# Patient Record
Sex: Female | Born: 1970
Health system: Southern US, Community
[De-identification: ages and names within clinical notes are randomized; demographics above are authoritative.]

## PROBLEM LIST (undated history)

## (undated) DIAGNOSIS — E785 Hyperlipidemia, unspecified: Secondary | ICD-10-CM

## (undated) DIAGNOSIS — G8929 Other chronic pain: Secondary | ICD-10-CM

## (undated) DIAGNOSIS — D649 Anemia, unspecified: Secondary | ICD-10-CM

## (undated) DIAGNOSIS — K760 Fatty (change of) liver, not elsewhere classified: Secondary | ICD-10-CM

## (undated) DIAGNOSIS — T7840XA Allergy, unspecified, initial encounter: Secondary | ICD-10-CM

## (undated) DIAGNOSIS — F418 Other specified anxiety disorders: Secondary | ICD-10-CM

## (undated) DIAGNOSIS — M549 Dorsalgia, unspecified: Secondary | ICD-10-CM

## (undated) DIAGNOSIS — Z8619 Personal history of other infectious and parasitic diseases: Secondary | ICD-10-CM

## (undated) DIAGNOSIS — G4730 Sleep apnea, unspecified: Secondary | ICD-10-CM

## (undated) DIAGNOSIS — K2270 Barrett's esophagus without dysplasia: Secondary | ICD-10-CM

## (undated) DIAGNOSIS — K5900 Constipation, unspecified: Secondary | ICD-10-CM

## (undated) DIAGNOSIS — K219 Gastro-esophageal reflux disease without esophagitis: Secondary | ICD-10-CM

## (undated) DIAGNOSIS — R600 Localized edema: Secondary | ICD-10-CM

## (undated) DIAGNOSIS — E559 Vitamin D deficiency, unspecified: Secondary | ICD-10-CM

## (undated) DIAGNOSIS — F419 Anxiety disorder, unspecified: Secondary | ICD-10-CM

## (undated) DIAGNOSIS — K317 Polyp of stomach and duodenum: Secondary | ICD-10-CM

## (undated) DIAGNOSIS — E119 Type 2 diabetes mellitus without complications: Secondary | ICD-10-CM

## (undated) HISTORY — DX: Barrett's esophagus without dysplasia: K22.70

## (undated) HISTORY — DX: Anemia, unspecified: D64.9

## (undated) HISTORY — DX: Type 2 diabetes mellitus without complications: E11.9

## (undated) HISTORY — DX: Polyp of stomach and duodenum: K31.7

## (undated) HISTORY — DX: Other chronic pain: G89.29

## (undated) HISTORY — PX: ESOPHAGOGASTRODUODENOSCOPY: SHX1529

## (undated) HISTORY — DX: Other specified anxiety disorders: F41.8

## (undated) HISTORY — DX: Constipation, unspecified: K59.00

## (undated) HISTORY — PX: WISDOM TOOTH EXTRACTION: SHX21

## (undated) HISTORY — DX: Sleep apnea, unspecified: G47.30

## (undated) HISTORY — DX: Hyperlipidemia, unspecified: E78.5

## (undated) HISTORY — DX: Personal history of other infectious and parasitic diseases: Z86.19

## (undated) HISTORY — DX: Allergy, unspecified, initial encounter: T78.40XA

## (undated) HISTORY — DX: Vitamin D deficiency, unspecified: E55.9

## (undated) HISTORY — DX: Anxiety disorder, unspecified: F41.9

## (undated) HISTORY — DX: Localized edema: R60.0

## (undated) HISTORY — DX: Gastro-esophageal reflux disease without esophagitis: K21.9

## (undated) HISTORY — DX: Dorsalgia, unspecified: M54.9

## (undated) HISTORY — DX: Fatty (change of) liver, not elsewhere classified: K76.0

---

## 1995-03-20 HISTORY — PX: LUMBAR DISC SURGERY: SHX700

## 1998-02-18 ENCOUNTER — Other Ambulatory Visit: Admission: RE | Admit: 1998-02-18 | Discharge: 1998-02-18 | Payer: Self-pay | Admitting: Obstetrics and Gynecology

## 1999-02-22 ENCOUNTER — Other Ambulatory Visit: Admission: RE | Admit: 1999-02-22 | Discharge: 1999-02-22 | Payer: Self-pay | Admitting: Obstetrics and Gynecology

## 1999-12-16 ENCOUNTER — Emergency Department (HOSPITAL_COMMUNITY): Admission: EM | Admit: 1999-12-16 | Discharge: 1999-12-16 | Payer: Self-pay | Admitting: Emergency Medicine

## 1999-12-16 ENCOUNTER — Encounter: Payer: Self-pay | Admitting: Emergency Medicine

## 2000-04-27 ENCOUNTER — Other Ambulatory Visit: Admission: RE | Admit: 2000-04-27 | Discharge: 2000-04-27 | Payer: Self-pay | Admitting: Obstetrics and Gynecology

## 2000-09-06 ENCOUNTER — Encounter: Payer: Self-pay | Admitting: Internal Medicine

## 2000-09-06 ENCOUNTER — Ambulatory Visit (HOSPITAL_COMMUNITY): Admission: RE | Admit: 2000-09-06 | Discharge: 2000-09-06 | Payer: Self-pay | Admitting: Internal Medicine

## 2001-03-19 HISTORY — PX: NASAL SINUS SURGERY: SHX719

## 2001-04-23 ENCOUNTER — Other Ambulatory Visit: Admission: RE | Admit: 2001-04-23 | Discharge: 2001-04-23 | Payer: Self-pay | Admitting: Otolaryngology

## 2001-04-23 ENCOUNTER — Encounter (INDEPENDENT_AMBULATORY_CARE_PROVIDER_SITE_OTHER): Payer: Self-pay

## 2001-05-11 ENCOUNTER — Other Ambulatory Visit: Admission: RE | Admit: 2001-05-11 | Discharge: 2001-05-11 | Payer: Self-pay | Admitting: Obstetrics and Gynecology

## 2002-05-14 ENCOUNTER — Other Ambulatory Visit: Admission: RE | Admit: 2002-05-14 | Discharge: 2002-05-14 | Payer: Self-pay | Admitting: Obstetrics and Gynecology

## 2003-03-21 ENCOUNTER — Other Ambulatory Visit: Admission: RE | Admit: 2003-03-21 | Discharge: 2003-03-21 | Payer: Self-pay | Admitting: Internal Medicine

## 2004-04-08 ENCOUNTER — Other Ambulatory Visit: Admission: RE | Admit: 2004-04-08 | Discharge: 2004-04-08 | Payer: Self-pay | Admitting: Internal Medicine

## 2005-04-11 ENCOUNTER — Other Ambulatory Visit: Admission: RE | Admit: 2005-04-11 | Discharge: 2005-04-11 | Payer: Self-pay | Admitting: Internal Medicine

## 2006-04-20 ENCOUNTER — Other Ambulatory Visit: Admission: RE | Admit: 2006-04-20 | Discharge: 2006-04-20 | Payer: Self-pay | Admitting: Internal Medicine

## 2007-05-31 ENCOUNTER — Other Ambulatory Visit: Admission: RE | Admit: 2007-05-31 | Discharge: 2007-05-31 | Payer: Self-pay | Admitting: Internal Medicine

## 2007-08-09 ENCOUNTER — Emergency Department (HOSPITAL_COMMUNITY): Admission: EM | Admit: 2007-08-09 | Discharge: 2007-08-09 | Payer: Self-pay | Admitting: Emergency Medicine

## 2008-08-28 ENCOUNTER — Ambulatory Visit: Payer: Self-pay | Admitting: Internal Medicine

## 2008-08-29 ENCOUNTER — Ambulatory Visit: Payer: Self-pay | Admitting: Internal Medicine

## 2008-09-26 ENCOUNTER — Ambulatory Visit: Payer: Self-pay | Admitting: Internal Medicine

## 2009-09-19 HISTORY — PX: DILATION AND CURETTAGE OF UTERUS: SHX78

## 2009-11-23 ENCOUNTER — Ambulatory Visit: Payer: Self-pay | Admitting: Internal Medicine

## 2009-12-21 ENCOUNTER — Ambulatory Visit (HOSPITAL_COMMUNITY): Admission: RE | Admit: 2009-12-21 | Discharge: 2009-12-21 | Payer: Self-pay | Admitting: Obstetrics and Gynecology

## 2010-08-30 ENCOUNTER — Ambulatory Visit: Payer: Self-pay | Admitting: Internal Medicine

## 2010-10-11 ENCOUNTER — Encounter: Payer: Self-pay | Admitting: Family Medicine

## 2010-12-13 LAB — HCG, SERUM, QUALITATIVE: Preg, Serum: NEGATIVE

## 2010-12-13 LAB — CBC
HCT: 39.1 % (ref 36.0–46.0)
Hemoglobin: 13.1 g/dL (ref 12.0–15.0)
MCHC: 33.6 g/dL (ref 30.0–36.0)
MCV: 91 fL (ref 78.0–100.0)
Platelets: 306 10*3/uL (ref 150–400)
RBC: 4.29 MIL/uL (ref 3.87–5.11)
RDW: 14.5 % (ref 11.5–15.5)
WBC: 11.2 10*3/uL — ABNORMAL HIGH (ref 4.0–10.5)

## 2012-05-20 DIAGNOSIS — K317 Polyp of stomach and duodenum: Secondary | ICD-10-CM

## 2012-05-20 HISTORY — DX: Polyp of stomach and duodenum: K31.7

## 2012-07-04 LAB — HIV ANTIBODY: HIV 1&2 Ab, 4th Generation: NONREACTIVE

## 2012-07-04 LAB — RPR: RPR: NONREACTIVE

## 2012-10-30 LAB — LIPID PANEL
Cholesterol, Total: 123
HDL: 39 mg/dL (ref 35–70)
LDL (calc): 59
Triglycerides: 124

## 2012-10-30 LAB — HEMOGLOBIN A1C: A1c: 6.7

## 2012-10-30 LAB — VITAMIN D 25 HYDROXY (VIT D DEFICIENCY, FRACTURES): Vit D, 25-Hydroxy: 20.22

## 2013-11-04 ENCOUNTER — Other Ambulatory Visit: Payer: Self-pay | Admitting: Internal Medicine

## 2013-11-04 ENCOUNTER — Ambulatory Visit: Payer: Self-pay | Admitting: Internal Medicine

## 2013-11-04 ENCOUNTER — Encounter: Payer: Self-pay | Admitting: Internal Medicine

## 2013-11-04 ENCOUNTER — Ambulatory Visit (INDEPENDENT_AMBULATORY_CARE_PROVIDER_SITE_OTHER): Payer: BC Managed Care – PPO | Admitting: Internal Medicine

## 2013-11-04 ENCOUNTER — Telehealth: Payer: Self-pay | Admitting: Radiology

## 2013-11-04 VITALS — BP 114/68 | HR 95 | Temp 98.4°F | Ht 65.25 in | Wt 271.0 lb

## 2013-11-04 DIAGNOSIS — Z6841 Body Mass Index (BMI) 40.0 and over, adult: Secondary | ICD-10-CM

## 2013-11-04 DIAGNOSIS — F32A Depression, unspecified: Secondary | ICD-10-CM

## 2013-11-04 DIAGNOSIS — K219 Gastro-esophageal reflux disease without esophagitis: Secondary | ICD-10-CM | POA: Insufficient documentation

## 2013-11-04 DIAGNOSIS — F3289 Other specified depressive episodes: Secondary | ICD-10-CM

## 2013-11-04 DIAGNOSIS — E119 Type 2 diabetes mellitus without complications: Secondary | ICD-10-CM

## 2013-11-04 DIAGNOSIS — F329 Major depressive disorder, single episode, unspecified: Secondary | ICD-10-CM

## 2013-11-04 DIAGNOSIS — R509 Fever, unspecified: Secondary | ICD-10-CM

## 2013-11-04 DIAGNOSIS — F331 Major depressive disorder, recurrent, moderate: Secondary | ICD-10-CM | POA: Insufficient documentation

## 2013-11-04 LAB — LIPID PANEL
Cholesterol: 179 mg/dL (ref 0–200)
HDL: 38.6 mg/dL — ABNORMAL LOW (ref >39.00)
LDL Cholesterol: 101 mg/dL — ABNORMAL HIGH (ref 0–99)
Total CHOL/HDL Ratio: 5
Triglycerides: 199 mg/dL — ABNORMAL HIGH (ref 0.0–149.0)
VLDL: 39.8 mg/dL (ref 0.0–40.0)

## 2013-11-04 LAB — COMPREHENSIVE METABOLIC PANEL
ALT: 20 U/L (ref 0–35)
AST: 30 U/L (ref 0–37)
Albumin: 3.7 g/dL (ref 3.5–5.2)
Alkaline Phosphatase: 94 U/L (ref 39–117)
BUN: 7 mg/dL (ref 6–23)
CO2: 26 mEq/L (ref 19–32)
Calcium: 8.9 mg/dL (ref 8.4–10.5)
Chloride: 102 mEq/L (ref 96–112)
Creatinine, Ser: 0.8 mg/dL (ref 0.4–1.2)
GFR: 82.99 mL/min (ref >60.00)
Glucose, Bld: 136 mg/dL — ABNORMAL HIGH (ref 70–99)
Potassium: 3.7 mEq/L (ref 3.5–5.1)
Sodium: 138 mEq/L (ref 135–145)
Total Bilirubin: 0.5 mg/dL (ref 0.3–1.2)
Total Protein: 7.5 g/dL (ref 6.0–8.3)

## 2013-11-04 LAB — CBC
HCT: 40.2 % (ref 36.0–46.0)
Hemoglobin: 13 g/dL (ref 12.0–15.0)
MCHC: 32.3 g/dL (ref 30.0–36.0)
MCV: 87.5 fl (ref 78.0–100.0)
Platelets: 344 10*3/uL (ref 150.0–400.0)
RBC: 4.6 Mil/uL (ref 3.87–5.11)
RDW: 15.4 % — ABNORMAL HIGH (ref 11.5–14.6)
WBC: 12.2 10*3/uL — ABNORMAL HIGH (ref 4.5–10.5)

## 2013-11-04 LAB — HEMOGLOBIN A1C: Hgb A1c MFr Bld: 8 % — ABNORMAL HIGH (ref 4.6–6.5)

## 2013-11-04 LAB — TSH: TSH: 2.61 u[IU]/mL (ref 0.35–5.50)

## 2013-11-04 LAB — POCT INFLUENZA A/B

## 2013-11-04 MED ORDER — DEXLANSOPRAZOLE 60 MG PO CPDR: 60.0000 mg | DELAYED_RELEASE_CAPSULE | Freq: Every day | ORAL | Status: DC

## 2013-11-04 NOTE — Telephone Encounter (Signed)
error 

## 2013-11-04 NOTE — Assessment & Plan Note (Signed)
Well controlled on effexor

## 2013-11-04 NOTE — Assessment & Plan Note (Signed)
Well controlled on Dexilant Medication refilled today

## 2013-11-04 NOTE — Assessment & Plan Note (Signed)
She does not test her sugars She is not taking the Tradjenta that is prescribed She is non compliant with diet and exercise Will check A1C today

## 2013-11-04 NOTE — Progress Notes (Signed)
HPI  Flu: 08/2013 Tetanus: more than 10 years ago LMP: 10/02/2012 Pap Smear: 2012 Mammogram: 2012 Eye Doctor: yearly Dentist: biannually  Past Medical History  Diagnosis Date  . Depression   . Diabetes mellitus without complication   . Allergy   . GERD (gastroesophageal reflux disease)   . Hyperlipidemia   . Barrett esophagus     Current Outpatient Prescriptions  Medication Sig Dispense Refill  . dexlansoprazole (DEXILANT) 60 MG capsule Take 60 mg by mouth daily.      Marland Kitchen LORazepam (ATIVAN) 0.5 MG tablet Take 0.5 mg by mouth every other day.      . venlafaxine XR (EFFEXOR-XR) 150 MG 24 hr capsule Take 150 mg by mouth daily with breakfast.      . venlafaxine XR (EFFEXOR-XR) 75 MG 24 hr capsule Take 75 mg by mouth daily with breakfast.       No current facility-administered medications for this visit.    Allergies  Allergen Reactions  . Biaxin [Clarithromycin] Hives    Family History  Problem Relation Age of Onset  . Breast cancer Maternal Grandmother   . Heart disease Paternal Grandfather     History   Social History  . Marital Status: Married    Spouse Name: N/A    Number of Children: N/A  . Years of Education: N/A   Occupational History  . Not on file.   Social History Main Topics  . Smoking status: Never Smoker   . Smokeless tobacco: Never Used  . Alcohol Use: No  . Drug Use: No  . Sexual Activity: Not on file   Other Topics Concern  . Not on file   Social History Narrative  . No narrative on file    ROS:  Constitutional: Denies fever, malaise, fatigue, headache or abrupt weight changes.  HEENT: Denies eye pain, eye redness, ear pain, ringing in the ears, wax buildup, runny nose, nasal congestion, bloody nose, or sore throat. Respiratory: Denies difficulty breathing, shortness of breath, cough or sputum production.   Cardiovascular: Denies chest pain, chest tightness, palpitations or swelling in the hands or feet.  Gastrointestinal: Denies  abdominal pain, bloating, constipation, diarrhea or blood in the stool.  GU: Denies frequency, urgency, pain with urination, blood in urine, odor or discharge. Musculoskeletal: Denies decrease in range of motion, difficulty with gait, muscle pain or joint pain and swelling.  Skin: Denies redness, rashes, lesions or ulcercations.  Neurological: Denies dizziness, difficulty with memory, difficulty with speech or problems with balance and coordination.   No other specific complaints in a complete review of systems (except as listed in HPI above).  PE:  BP 114/68  Pulse 95  Temp(Src) 98.4 F (36.9 C) (Oral)  Ht 5' 5.25" (1.657 m)  Wt 271 lb (122.925 kg)  BMI 44.77 kg/m2  SpO2 98%  LMP 10/02/2012 Wt Readings from Last 3 Encounters:  11/04/13 271 lb (122.925 kg)    General: Appears their stated age, well developed, well nourished in NAD. HEENT: Head: normal shape and size; Eyes: sclera white, no icterus, conjunctiva pink, PERRLA and EOMs intact; Ears: Tm's gray and intact, normal light reflex; Nose: mucosa pink and moist, septum midline; Throat/Mouth: Teeth present, mucosa pink and moist, no lesions or ulcerations noted.  Neck: Normal range of motion. Neck supple, trachea midline. No massses, lumps or thyromegaly present.  Cardiovascular: Normal rate and rhythm. S1,S2 noted.  No murmur, rubs or gallops noted. No JVD or BLE edema. No carotid bruits noted. Pulmonary/Chest: Normal effort and positive  vesicular breath sounds. No respiratory distress. No wheezes, rales or ronchi noted.  Abdomen: Soft and nontender. Normal bowel sounds, no bruits noted. No distention or masses noted. Liver, spleen and kidneys non palpable. Musculoskeletal: Normal range of motion. No signs of joint swelling. No difficulty with gait.  Neurological: Alert and oriented. Cranial nerves II-XII intact. Coordination normal. +DTRs bilaterally. Psychiatric: Mood and affect normal. Behavior is normal. Judgment and thought  content normal.   EKG:  BMET No results found for this basename: na, k, cl, co2, glucose, bun, creatinine, calcium, gfrnonaa, gfraa    Lipid Panel  No results found for this basename: chol, trig, hdl, cholhdl, vldl, ldlcalc    CBC    Component Value Date/Time   WBC 11.2* 12/17/2009 1441   RBC 4.29 12/17/2009 1441   HGB 13.1 12/17/2009 1441   HCT 39.1 12/17/2009 1441   PLT 306 12/17/2009 1441   MCV 91.0 12/17/2009 1441   MCHC 33.6 12/17/2009 1441   RDW 14.5 12/17/2009 1441    Hgb A1C No results found for this basename: HGBA1C     Assessment and Plan:

## 2013-11-04 NOTE — Patient Instructions (Addendum)
Type 2 Diabetes Mellitus, Adult Type 2 diabetes mellitus, often simply referred to as type 2 diabetes, is a long-lasting (chronic) disease. In type 2 diabetes, the pancreas does not make enough insulin (a hormone), the cells are less responsive to the insulin that is made (insulin resistance), or both. Normally, insulin moves sugars from food into the tissue cells. The tissue cells use the sugars for energy. The lack of insulin or the lack of normal response to insulin causes excess sugars to build up in the blood instead of going into the tissue cells. As a result, high blood sugar (hyperglycemia) develops. The effect of high sugar (glucose) levels can cause many complications. Type 2 diabetes was also previously called adult-onset diabetes but it can occur at any age.  RISK FACTORS  A person is predisposed to developing type 2 diabetes if someone in the family has the disease and also has one or more of the following primary risk factors:  Overweight.  An inactive lifestyle.  A history of consistently eating high-calorie foods. Maintaining a normal weight and regular physical activity can reduce the chance of developing type 2 diabetes. SYMPTOMS  A person with type 2 diabetes may not show symptoms initially. The symptoms of type 2 diabetes appear slowly. The symptoms include:  Increased thirst (polydipsia).  Increased urination (polyuria).  Increased urination during the night (nocturia).  Weight loss. This weight loss may be rapid.  Frequent, recurring infections.  Tiredness (fatigue).  Weakness.  Vision changes, such as blurred vision.  Fruity smell to your breath.  Abdominal pain.  Nausea or vomiting.  Cuts or bruises which are slow to heal.  Tingling or numbness in the hands or feet. DIAGNOSIS Type 2 diabetes is frequently not diagnosed until complications of diabetes are present. Type 2 diabetes is diagnosed when symptoms or complications are present and when blood  glucose levels are increased. Your blood glucose level may be checked by one or more of the following blood tests:  A fasting blood glucose test. You will not be allowed to eat for at least 8 hours before a blood sample is taken.  A random blood glucose test. Your blood glucose is checked at any time of the day regardless of when you ate.  A hemoglobin A1c blood glucose test. A hemoglobin A1c test provides information about blood glucose control over the previous 3 months.  An oral glucose tolerance test (OGTT). Your blood glucose is measured after you have not eaten (fasted) for 2 hours and then after you drink a glucose-containing beverage. TREATMENT   You may need to take insulin or diabetes medicine daily to keep blood glucose levels in the desired range.  You will need to match insulin dosing with exercise and healthy food choices. The treatment goal is to maintain the before meal blood sugar (preprandial glucose) level at 70 130 mg/dL. HOME CARE INSTRUCTIONS   Have your hemoglobin A1c level checked twice a year.  Perform daily blood glucose monitoring as directed by your caregiver.  Monitor urine ketones when you are ill and as directed by your caregiver.  Take your diabetes medicine or insulin as directed by your caregiver to maintain your blood glucose levels in the desired range.  Never run out of diabetes medicine or insulin. It is needed every day.  Adjust insulin based on your intake of carbohydrates. Carbohydrates can raise blood glucose levels but need to be included in your diet. Carbohydrates provide vitamins, minerals, and fiber which are an essential part of   a healthy diet. Carbohydrates are found in fruits, vegetables, whole grains, dairy products, legumes, and foods containing added sugars.    Eat healthy foods. Alternate 3 meals with 3 snacks.  Lose weight if overweight.  Carry a medical alert card or wear your medical alert jewelry.  Carry a 15 gram  carbohydrate snack with you at all times to treat low blood glucose (hypoglycemia). Some examples of 15 gram carbohydrate snacks include:  Glucose tablets, 3 or 4   Glucose gel, 15 gram tube  Raisins, 2 tablespoons (24 grams)  Jelly beans, 6  Animal crackers, 8  Regular pop, 4 ounces (120 mL)  Gummy treats, 9  Recognize hypoglycemia. Hypoglycemia occurs with blood glucose levels of 70 mg/dL and below. The risk for hypoglycemia increases when fasting or skipping meals, during or after intense exercise, and during sleep. Hypoglycemia symptoms can include:  Tremors or shakes.  Decreased ability to concentrate.  Sweating.  Increased heart rate.  Headache.  Dry mouth.  Hunger.  Irritability.  Anxiety.  Restless sleep.  Altered speech or coordination.  Confusion.  Treat hypoglycemia promptly. If you are alert and able to safely swallow, follow the 15:15 rule:  Take 15 20 grams of rapid-acting glucose or carbohydrate. Rapid-acting options include glucose gel, glucose tablets, or 4 ounces (120 mL) of fruit juice, regular soda, or low fat milk.  Check your blood glucose level 15 minutes after taking the glucose.  Take 15 20 grams more of glucose if the repeat blood glucose level is still 70 mg/dL or below.  Eat a meal or snack within 1 hour once blood glucose levels return to normal.    Be alert to polyuria and polydipsia which are early signs of hyperglycemia. An early awareness of hyperglycemia allows for prompt treatment. Treat hyperglycemia as directed by your caregiver.  Engage in at least 150 minutes of moderate-intensity physical activity a week, spread over at least 3 days of the week or as directed by your caregiver. In addition, you should engage in resistance exercise at least 2 times a week or as directed by your caregiver.  Adjust your medicine and food intake as needed if you start a new exercise or sport.  Follow your sick day plan at any time you  are unable to eat or drink as usual.  Avoid tobacco use.  Limit alcohol intake to no more than 1 drink per day for nonpregnant women and 2 drinks per day for men. You should drink alcohol only when you are also eating food. Talk with your caregiver whether alcohol is safe for you. Tell your caregiver if you drink alcohol several times a week.  Follow up with your caregiver regularly.  Schedule an eye exam soon after the diagnosis of type 2 diabetes and then annually.  Perform daily skin and foot care. Examine your skin and feet daily for cuts, bruises, redness, nail problems, bleeding, blisters, or sores. A foot exam by a caregiver should be done annually.  Brush your teeth and gums at least twice a day and floss at least once a day. Follow up with your dentist regularly.  Share your diabetes management plan with your workplace or school.  Stay up-to-date with immunizations.  Learn to manage stress.  Obtain ongoing diabetes education and support as needed.  Participate in, or seek rehabilitation as needed to maintain or improve independence and quality of life. Request a physical or occupational therapy referral if you are having foot or hand numbness or difficulties with grooming,   dressing, eating, or physical activity. SEEK MEDICAL CARE IF:   You are unable to eat food or drink fluids for more than 6 hours.  You have nausea and vomiting for more than 6 hours.  Your blood glucose level is over 240 mg/dL.  There is a change in mental status.  You develop an additional serious illness.  You have diarrhea for more than 6 hours.  You have been sick or have had a fever for a couple of days and are not getting better.  You have pain during any physical activity.  SEEK IMMEDIATE MEDICAL CARE IF:  You have difficulty breathing.  You have moderate to large ketone levels. MAKE SURE YOU:  Understand these instructions.  Will watch your condition.  Will get help right away if  you are not doing well or get worse. Document Released: 09/05/2005 Document Revised: 05/30/2012 Document Reviewed: 04/03/2012 [employer] Patient Information 2014 [employer].  

## 2013-11-04 NOTE — Progress Notes (Signed)
Pre-visit discussion using our clinic review tool. No additional management support is needed unless otherwise documented below in the visit note.  

## 2013-11-04 NOTE — Assessment & Plan Note (Signed)
Non compliant with diet and exercise Will also check A1C today

## 2013-11-08 ENCOUNTER — Other Ambulatory Visit: Payer: Self-pay | Admitting: Internal Medicine

## 2013-11-08 MED ORDER — GLIPIZIDE 10 MG PO TABS: 10.0000 mg | ORAL_TABLET | Freq: Two times a day (BID) | ORAL | Status: DC

## 2013-11-12 ENCOUNTER — Encounter: Payer: BC Managed Care – PPO | Admitting: Internal Medicine

## 2013-11-19 ENCOUNTER — Telehealth: Payer: Self-pay

## 2013-11-19 NOTE — Telephone Encounter (Signed)
Pt left v/m pt has been taking glucotrol and is not feeling well and is not sure if it from taking med or from blood sugars. Pt request cb after 3 PM. Tried to reach pt by phone but unsuccessful.Please advise.

## 2013-11-19 NOTE — Telephone Encounter (Signed)
Does she have a way to test her blood sugars?

## 2013-11-20 MED ORDER — GLUCOSE BLOOD VI STRP: 1.0000 | ORAL_STRIP | Status: DC | PRN

## 2013-11-20 NOTE — Telephone Encounter (Signed)
Pt states she has not taken medication last night and today and she feels a lot better--pt states she does have a One Touch Ultra glucometer but no strips for it--is it okay to send in strips for pt? Pt also is requesting suggested diet information so she can try to watch what she eats--please advise

## 2013-11-20 NOTE — Telephone Encounter (Signed)
Ok to send strips and info on diabetic diet

## 2013-11-20 NOTE — Telephone Encounter (Signed)
left detailed message on VM for pt to return call

## 2013-11-22 NOTE — Telephone Encounter (Signed)
Test strips sent and will mail pt diabetic diet

## 2013-11-25 ENCOUNTER — Other Ambulatory Visit (HOSPITAL_COMMUNITY)
Admission: RE | Admit: 2013-11-25 | Discharge: 2013-11-25 | Disposition: A | Discharge status: Home / Self Care | Payer: BC Managed Care – PPO | Source: Ambulatory Visit | Attending: Internal Medicine | Admitting: Internal Medicine

## 2013-11-25 ENCOUNTER — Ambulatory Visit (INDEPENDENT_AMBULATORY_CARE_PROVIDER_SITE_OTHER): Payer: BC Managed Care – PPO | Admitting: Internal Medicine

## 2013-11-25 ENCOUNTER — Encounter: Payer: Self-pay | Admitting: Internal Medicine

## 2013-11-25 VITALS — BP 112/68 | HR 88 | Temp 98.1°F | Ht 64.0 in | Wt 271.0 lb

## 2013-11-25 DIAGNOSIS — R197 Diarrhea, unspecified: Secondary | ICD-10-CM

## 2013-11-25 DIAGNOSIS — E119 Type 2 diabetes mellitus without complications: Secondary | ICD-10-CM

## 2013-11-25 DIAGNOSIS — Z1151 Encounter for screening for human papillomavirus (HPV): Secondary | ICD-10-CM | POA: Insufficient documentation

## 2013-11-25 DIAGNOSIS — Z0000 Encounter for general adult medical examination without abnormal findings: Secondary | ICD-10-CM

## 2013-11-25 DIAGNOSIS — Z01419 Encounter for gynecological examination (general) (routine) without abnormal findings: Secondary | ICD-10-CM | POA: Insufficient documentation

## 2013-11-25 DIAGNOSIS — Z124 Encounter for screening for malignant neoplasm of cervix: Secondary | ICD-10-CM

## 2013-11-25 MED ORDER — LINAGLIPTIN 5 MG PO TABS: 5.0000 mg | ORAL_TABLET | Freq: Every day | ORAL | Status: DC

## 2013-11-25 NOTE — Patient Instructions (Addendum)

## 2013-11-25 NOTE — Progress Notes (Signed)
Pre visit review using our clinic review tool, if applicable. No additional management support is needed unless otherwise documented below in the visit note. 

## 2013-11-25 NOTE — Assessment & Plan Note (Addendum)
Failed metformin and glucotrol Will restart tradjenta She is not ready to start Victoza today  Will recheck A1C in 3 months

## 2013-11-25 NOTE — Progress Notes (Signed)
Subjective:    Patient ID: Lisa Curry, female    DOB: 1970-09-24, 43 y.o.   MRN: [id number]  HPI  Pt presents to the clinic today for her annual physical and pap smear.  She does c/o nausea shortly after starting the glucotrol. She has since stopped it and the nauseated feeling has gone away. She had the same reaction with Metformin in the past.  Flu: 08/2013  Tetanus: more than 10 years ago  LMP:11/07/2012 Pap Smear: 2012  Mammogram: 2012  Eye Doctor: yearly  Dentist: biannually  Review of Systems      Past Medical History  Diagnosis Date  . Depression   . Diabetes mellitus without complication   . Allergy   . GERD (gastroesophageal reflux disease)   . Hyperlipidemia   . Barrett esophagus     Current Outpatient Prescriptions  Medication Sig Dispense Refill  . dexlansoprazole (DEXILANT) 60 MG capsule Take 1 capsule (60 mg total) by mouth daily.  30 capsule  2  . glucose blood (ONE TOUCH TEST STRIPS) test strip 1 each by Other route as needed for other. Use as instructed  100 each  4  . venlafaxine XR (EFFEXOR-XR) 150 MG 24 hr capsule Take 150 mg by mouth daily with breakfast.      . venlafaxine XR (EFFEXOR-XR) 75 MG 24 hr capsule Take 75 mg by mouth daily with breakfast.       No current facility-administered medications for this visit.    Allergies  Allergen Reactions  . Biaxin [Clarithromycin] Hives    Family History  Problem Relation Age of Onset  . Breast cancer Maternal Grandmother   . Heart disease Paternal Grandfather     History   Social History  . Marital Status: Married    Spouse Name: N/A    Number of Children: N/A  . Years of Education: N/A   Occupational History  . Not on file.   Social History Main Topics  . Smoking status: Never Smoker   . Smokeless tobacco: Never Used  . Alcohol Use: No  . Drug Use: No  . Sexual Activity: Not on file   Other Topics Concern  . Not on file   Social History Narrative  . No narrative on file      Constitutional: Denies fever, malaise, fatigue, headache or abrupt weight changes.  HEENT: Denies eye pain, eye redness, ear pain, ringing in the ears, wax buildup, runny nose, nasal congestion, bloody nose, or sore throat. Respiratory: Denies difficulty breathing, shortness of breath, cough or sputum production.   Cardiovascular: Denies chest pain, chest tightness, palpitations or swelling in the hands or feet.  Gastrointestinal: Denies abdominal pain, bloating, constipation, diarrhea or blood in the stool.  GU: Denies urgency, frequency, pain with urination, burning sensation, blood in urine, odor or discharge. Musculoskeletal: Denies decrease in range of motion, difficulty with gait, muscle pain or joint pain and swelling.  Skin: Denies redness, rashes, lesions or ulcercations.  Neurological: Denies dizziness, difficulty with memory, difficulty with speech or problems with balance and coordination.   No other specific complaints in a complete review of systems (except as listed in HPI above).  Objective:   Physical Exam    BP 112/68  Pulse 88  Temp(Src) 98.1 F (36.7 C) (Oral)  Ht 5\' 4"  (1.626 m)  Wt 271 lb (122.925 kg)  BMI 46.49 kg/m2  SpO2 99%  LMP 10/02/2012 Wt Readings from Last 3 Encounters:  11/25/13 271 lb (122.925 kg)  11/04/13 271  lb (122.925 kg)    Constitutional:  Alert, oriented x 4, well developed, well nourished in no apparent distress. Skin: Skin is warm and dry.  No erythema, lesion or ulceration noted. HEENT: Head: normal shape and size; Eyes: sclera white, no icterus, conjunctiva pink, PERRLA and EOMs intact; Ears: Tm's gray and intact, normal light reflex; Nose: mucosa pink and moist, septum midline; Throat/Mouth: Teeth present, , mucosa pink and moist, no lesions or ulcerations noted. Neck: Normal range of motion. Neck supple, trachea midline. No massses, lumps or thyromegaly present.  Cardiovascular: Normal rate and rhythm. S1,S2 noted.  No murmur,  rubs or gallops noted. No JVD or BLE edema. No carotid bruits noted. Pulmonary/Chest: Normal effort and positive vesicular breath sounds. No respiratory distress. No wheezes, rales or ronchi noted.  Abdomen: Soft and nontender. Normal bowel sounds, no bruits noted. No distention or masses noted. Liver, spleen and kidneys non palpable. Genitourinary: Normal female anatomy. Uterus midline, anterior and soft. No CMT or discharge noted. [place] non palpable. Breast without lumps or masses.  Musculoskeletal: Normal range of motion. Patient exhibits no effusions.  Neurological: Alert and oriented. Cranial nerves II-XII intact. Coordination normal. +DTRs bilaterally. Psychiatric: She has a normal mood and affect. Behavior is normal. Judgment and thought content normal.      BMET    Component Value Date/Time   NA 138 11/04/2013 1503   K 3.7 11/04/2013 1503   CL 102 11/04/2013 1503   CO2 26 11/04/2013 1503   GLUCOSE 136* 11/04/2013 1503   BUN 7 11/04/2013 1503   CREATININE 0.8 11/04/2013 1503   CALCIUM 8.9 11/04/2013 1503    Lipid Panel     Component Value Date/Time   CHOL 179 11/04/2013 1503   TRIG 199.0* 11/04/2013 1503   HDL 38.60* 11/04/2013 1503   CHOLHDL 5 11/04/2013 1503   VLDL 39.8 11/04/2013 1503   LDLCALC 101* 11/04/2013 1503    CBC    Component Value Date/Time   WBC 12.2* 11/04/2013 1503   RBC 4.60 11/04/2013 1503   HGB 13.0 11/04/2013 1503   HCT 40.2 11/04/2013 1503   PLT 344.0 11/04/2013 1503   MCV 87.5 11/04/2013 1503   MCHC 32.3 11/04/2013 1503   RDW 15.4* 11/04/2013 1503    Hgb A1C Lab Results  Component Value Date   HGBA1C 8.0* 11/04/2013       Assessment & Plan:   Prevent Health Maintenance:  She declines Tdap today Will order her mammogram Pap smear obtained today Lab work reviewed

## 2013-11-27 ENCOUNTER — Telehealth: Payer: Self-pay

## 2013-11-27 NOTE — Telephone Encounter (Signed)
Relevant patient education mailed to patient.  

## 2013-12-04 ENCOUNTER — Telehealth: Payer: Self-pay

## 2013-12-04 NOTE — Telephone Encounter (Signed)
Called and left detailed message on VM letting pt know that we received notification of her Tradjenta (11/28/2013-11/28/2014) being approved through her insurance--per HIPAA

## 2013-12-18 LAB — HM DIABETES EYE EXAM

## 2013-12-23 ENCOUNTER — Other Ambulatory Visit: Payer: Self-pay

## 2013-12-23 DIAGNOSIS — Z1231 Encounter for screening mammogram for malignant neoplasm of breast: Secondary | ICD-10-CM

## 2013-12-24 ENCOUNTER — Ambulatory Visit
Admission: RE | Admit: 2013-12-24 | Discharge: 2013-12-24 | Disposition: A | Discharge status: Home / Self Care | Payer: BC Managed Care – PPO | Source: Ambulatory Visit

## 2013-12-24 DIAGNOSIS — Z1231 Encounter for screening mammogram for malignant neoplasm of breast: Secondary | ICD-10-CM

## 2013-12-26 ENCOUNTER — Other Ambulatory Visit: Payer: Self-pay | Admitting: Internal Medicine

## 2013-12-26 DIAGNOSIS — R928 Other abnormal and inconclusive findings on diagnostic imaging of breast: Secondary | ICD-10-CM

## 2014-01-08 ENCOUNTER — Ambulatory Visit
Admission: RE | Admit: 2014-01-08 | Discharge: 2014-01-08 | Disposition: A | Discharge status: Home / Self Care | Payer: BC Managed Care – PPO | Source: Ambulatory Visit | Attending: Internal Medicine | Admitting: Internal Medicine

## 2014-01-08 DIAGNOSIS — R928 Other abnormal and inconclusive findings on diagnostic imaging of breast: Secondary | ICD-10-CM

## 2014-01-28 ENCOUNTER — Other Ambulatory Visit: Payer: Self-pay | Admitting: Internal Medicine

## 2014-02-27 ENCOUNTER — Encounter: Payer: Self-pay | Admitting: Family Medicine

## 2014-02-27 ENCOUNTER — Ambulatory Visit (INDEPENDENT_AMBULATORY_CARE_PROVIDER_SITE_OTHER): Payer: BC Managed Care – PPO | Admitting: Family Medicine

## 2014-02-27 ENCOUNTER — Telehealth: Payer: Self-pay | Admitting: Internal Medicine

## 2014-02-27 VITALS — BP 128/84 | HR 72 | Temp 98.8°F | Wt 268.0 lb

## 2014-02-27 DIAGNOSIS — F32A Depression, unspecified: Secondary | ICD-10-CM

## 2014-02-27 DIAGNOSIS — F329 Major depressive disorder, single episode, unspecified: Secondary | ICD-10-CM

## 2014-02-27 DIAGNOSIS — R5383 Other fatigue: Secondary | ICD-10-CM

## 2014-02-27 DIAGNOSIS — F3289 Other specified depressive episodes: Secondary | ICD-10-CM

## 2014-02-27 DIAGNOSIS — R5381 Other malaise: Secondary | ICD-10-CM

## 2014-02-27 MED ORDER — LORAZEPAM 0.5 MG PO TABS: 0.5000 mg | ORAL_TABLET | Freq: Two times a day (BID) | ORAL | Status: DC | PRN

## 2014-02-27 NOTE — Telephone Encounter (Signed)
Patient Information:  Caller Name: Christe  Phone: [phone]  Patient: Lisa Curry, Lisa Curry  Gender: Female  DOB: [birth year]  Age: 43 Years  PCP: Webb Silversmith  Pregnant: No  Office Follow Up:  Does the office need to follow up with this patient?: No  Instructions For The Office: N/A  RN Note:  PCP Webb Silversmith NP is booked.  Appt scheduled with provider Dr. Danise [name] MD at 15:45. Care advice provided. Understanding expressed  Symptoms  Reason For Call & Symptoms: Patient states "I am not sure what is going on..I am moody and exhausted, and I am tired of feeling like this".  " I get angry easy, can't control my emotions".   Onset two weeks ago.   Occasional Headache.  Sleeping and eating well.  GBS 170.  Patient having difficulty describing symptoms.  She is compliant with antidepressants. Unsure if this is related. Voice is clear.  Reviewed Health History In EMR: Yes  Reviewed Medications In EMR: Yes  Reviewed Allergies In EMR: Yes  Reviewed Surgeries / Procedures: Yes  Date of Onset of Symptoms: 02/13/2014 OB / GYN:  LMP: 01/23/2014  Guideline(s) Used:  Weakness (Generalized) and Fatigue  Disposition Per Guideline:   See Today in Office  Reason For Disposition Reached:   Moderate weakness (i.e., interferes with work, school, normal activities) and persists > 3 days  Advice Given:  Call Back If:  Unable to stand or walk  Passes out  Breathing difficulty occurs  You become worse.  Patient Will Follow Care Advice:  YES  Appointment Scheduled:  02/27/2014 15:45:00 Appointment Scheduled Provider:  Other

## 2014-02-27 NOTE — Patient Instructions (Signed)
Good to see you today, I'm sorry you're having a tough time! I think that things should improve with end of school year, as well as upcoming vacation. Let's continue effexor 225mg  XR daily. Let's do ativan 0.5mg  1 tablet twice daily as needed for anxiety. If persisent trouble over next few weeks, call me with an update for blood work and further evaluation.
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
## 2014-02-27 NOTE — Progress Notes (Signed)
Pre visit review using our clinic review tool, if applicable. No additional management support is needed unless otherwise documented below in the visit note. 

## 2014-02-27 NOTE — Assessment & Plan Note (Addendum)
Exacerbation - anticipate due to work related stressors. Support provided - and discussed different options available including stress-relieving strategies, adjuvant antidepressant, prn anxiolytic, and counseling. Really anticipate situational deterioration so recommended prn anxiolytic and will prescribe ativan 0.5mg  bid prn anxiety - discussed dependence/tolerance and addiction potential of medication and low risk of this with anticipated temporary course. Planning vacation to beach next week after school's out. Reviewed reasons to return to seek care and I asked her to notify me if persistent trouble after school is out - would consider adjuvant med, counseling, labwork at that time. GAD7 = 2 PHQ9 = 6, somewhat difficult to function. A total of 25 minutes were spent face-to-face with the patient during this encounter and over half of that time was spent on counseling and coordination of care

## 2014-02-27 NOTE — Assessment & Plan Note (Addendum)
Anticipate related to above - worsening depression from work stressors. Discussed anticipated improvement next week when school ends, but advised if persistent fatigue to notify me for further evaluation. Pt agrees with plan. ESS today = 5

## 2014-02-27 NOTE — Telephone Encounter (Signed)
Will see then. 

## 2014-03-07 ENCOUNTER — Ambulatory Visit: Payer: BC Managed Care – PPO | Admitting: Internal Medicine

## 2014-03-26 ENCOUNTER — Telehealth: Payer: Self-pay | Admitting: Internal Medicine

## 2014-03-26 NOTE — Telephone Encounter (Signed)
Sure

## 2014-03-26 NOTE — Telephone Encounter (Signed)
I spoke with patient and let her know she can switch to Dr.Gutierrez.  Patient was suppose to come in to have her A1c checked and she scheduled the appointment on 03/28/14.  Patient wanted to know when she needs to follow up with Dr.Gutierrez.  I let her know I would leave a message asking when she needs to follow up and you would probably let her know when her lab results come back.

## 2014-03-26 NOTE — Telephone Encounter (Signed)
Ok by me if ok by PCP

## 2014-03-26 NOTE — Telephone Encounter (Signed)
Patient was told when she made new patient appointment with Rollene Fare that if she started with Regina,Regina would be her PCP.  Patient is asking to switch to Dr Danise [name].  Patient said she saw Dr.Gutierrez a couple of weeks ago and she said she asked questions and she received more thorough answers from [place].  Can Patient switch to Dr.Gutierrez?

## 2014-03-27 ENCOUNTER — Telehealth: Payer: Self-pay | Admitting: Family Medicine

## 2014-03-27 NOTE — Telephone Encounter (Signed)
Made follow up visit due to feelings patient was having. Cancelled lab appt and will draw at appt.

## 2014-03-27 NOTE — Telephone Encounter (Signed)
rec schedule appt after labwork.

## 2014-03-27 NOTE — Telephone Encounter (Signed)
Noted, will see tomorrow

## 2014-03-27 NOTE — Telephone Encounter (Signed)
Patient required appt due to depression symptoms. See CAN note.

## 2014-03-27 NOTE — Telephone Encounter (Signed)
Caller: Lumen/Patient; Phone: [phone]; Reason for Call: Pt wanting to f/u w/ Dr Danise [name] from last OV.  Pt continues to have crying spells, feeling of hopleless, no improvement after starting Ativan.  Pt wanting to discuss sxs directly w/ MD.  Pt denies suicidal/homicial thoughts. Please have MD call Pt unless he would rather have her make appt.

## 2014-03-28 ENCOUNTER — Encounter: Payer: Self-pay | Admitting: Family Medicine

## 2014-03-28 ENCOUNTER — Ambulatory Visit (INDEPENDENT_AMBULATORY_CARE_PROVIDER_SITE_OTHER): Payer: BC Managed Care – PPO | Admitting: Family Medicine

## 2014-03-28 ENCOUNTER — Other Ambulatory Visit: Payer: BC Managed Care – PPO

## 2014-03-28 VITALS — BP 126/84 | HR 64 | Temp 98.2°F | Wt 265.0 lb

## 2014-03-28 DIAGNOSIS — R5383 Other fatigue: Secondary | ICD-10-CM

## 2014-03-28 DIAGNOSIS — E119 Type 2 diabetes mellitus without complications: Secondary | ICD-10-CM

## 2014-03-28 DIAGNOSIS — R5381 Other malaise: Secondary | ICD-10-CM

## 2014-03-28 DIAGNOSIS — F32A Depression, unspecified: Secondary | ICD-10-CM

## 2014-03-28 DIAGNOSIS — F3289 Other specified depressive episodes: Secondary | ICD-10-CM

## 2014-03-28 DIAGNOSIS — R5382 Chronic fatigue, unspecified: Secondary | ICD-10-CM

## 2014-03-28 DIAGNOSIS — F329 Major depressive disorder, single episode, unspecified: Secondary | ICD-10-CM

## 2014-03-28 LAB — CBC WITH DIFFERENTIAL/PLATELET
Basophils Absolute: 0 10*3/uL (ref 0.0–0.1)
Basophils Relative: 0 % (ref 0–1)
Eosinophils Absolute: 0.2 10*3/uL (ref 0.0–0.7)
Eosinophils Relative: 2 % (ref 0–5)
HCT: 39 % (ref 36.0–46.0)
Hemoglobin: 12.9 g/dL (ref 12.0–15.0)
Lymphocytes Relative: 26 % (ref 12–46)
Lymphs Abs: 2.7 10*3/uL (ref 0.7–4.0)
MCH: 27.7 pg (ref 26.0–34.0)
MCHC: 33.1 g/dL (ref 30.0–36.0)
MCV: 83.9 fL (ref 78.0–100.0)
Monocytes Absolute: 0.3 10*3/uL (ref 0.1–1.0)
Monocytes Relative: 3 % (ref 3–12)
Neutro Abs: 7.2 10*3/uL (ref 1.7–7.7)
Neutrophils Relative %: 69 % (ref 43–77)
Platelets: 349 10*3/uL (ref 150–400)
RBC: 4.65 MIL/uL (ref 3.87–5.11)
RDW: 15.5 % (ref 11.5–15.5)
WBC: 10.5 10*3/uL (ref 4.0–10.5)

## 2014-03-28 MED ORDER — LINAGLIPTIN 5 MG PO TABS: 5.0000 mg | ORAL_TABLET | Freq: Every day | ORAL | Status: DC

## 2014-03-28 NOTE — Patient Instructions (Addendum)
Let's check blood work today. We will call you with results and plan. questionairres repeated today. Good to see you today, call us with questions. Return to see me in 1-2 months for follow up. Pass by Linda's office to schedule referral for diabetes education.

## 2014-03-28 NOTE — Progress Notes (Signed)
Pre visit review using our clinic review tool, if applicable. No additional management support is needed unless otherwise documented below in the visit note. 

## 2014-03-28 NOTE — Progress Notes (Signed)
BP 126/84  Pulse 64  Temp(Src) 98.2 F (36.8 C) (Oral)  Wt 265 lb (120.203 kg)  LMP 03/02/2014   CC: anxiety f/u visit  Subjective:    Patient ID: Lisa Curry, female    DOB: [birth year], 43 y.o.   MRN: [id number]  HPI: [name] is a 43 y.o. female presenting on 03/28/2014 for Follow-up   See prior note for details. At last visit, thought situational anxiety related to work stressors and treated with prn lorazepam.  Has not improved despite summer break. Did feel better during recent beach trip but since she's been home notes persistent trouble with mood.  Depression/anxiety - on effexor 225mg  XR for last 4+ years. Lorazepam seems to help some but not as much as she would like. Persistent crying spells. No irritability, no worrying. Sleeping too much. Appetite stable, energy level decreased. No SI/HI.  DM - on tradjenta 5mg  daily and reports compliance. Checks sugars every morning fasting running: 170-200.  Metformin in past caused persistent diarrhea. Does not want to retry this medication. Lab Results  Component Value Date   HGBA1C 8.7* 03/28/2014    Relevant past medical, surgical, family and social history reviewed and updated as indicated.  Allergies and medications reviewed and updated. Current Outpatient Prescriptions on File Prior to Visit  Medication Sig  . DEXILANT 60 MG capsule TAKE 1 CAPSULE (60 MG TOTAL) BY MOUTH DAILY.  Marland Kitchen glucose blood (ONE TOUCH TEST STRIPS) test strip 1 each by Other route as needed for other. Use as instructed  . LORazepam (ATIVAN) 0.5 MG tablet Take 1 tablet (0.5 mg total) by mouth 2 (two) times daily as needed for anxiety.  Marland Kitchen venlafaxine XR (EFFEXOR-XR) 150 MG 24 hr capsule Take 150 mg by mouth daily with breakfast.  . venlafaxine XR (EFFEXOR-XR) 75 MG 24 hr capsule Take 75 mg by mouth daily with breakfast.   No current facility-administered medications on file prior to visit.    Review of Systems Per HPI unless specifically indicated  above    Objective:    BP 126/84  Pulse 64  Temp(Src) 98.2 F (36.8 C) (Oral)  Wt 265 lb (120.203 kg)  LMP 03/02/2014  Physical Exam  Nursing note and vitals reviewed. Constitutional: She appears well-developed and well-nourished. No distress.  HENT:  Mouth/Throat: Oropharynx is clear and moist. No oropharyngeal exudate.  Eyes: Conjunctivae and EOM are normal. Pupils are equal, round, and reactive to light. No scleral icterus.  Cardiovascular: Normal rate, regular rhythm, normal heart sounds and intact distal pulses.   No murmur heard. Pulmonary/Chest: Effort normal and breath sounds normal. No respiratory distress. She has no wheezes. She has no rales.  Musculoskeletal: She exhibits no edema.  Psychiatric: She has a normal mood and affect.       Assessment & Plan:   Problem List Items Addressed This Visit   Fatigue     Check for reversible causes today given persistent symptoms.    Relevant Orders      TSH (Completed)      Vitamin B12 (Completed)      CBC with Differential (Completed)   DM type 2 (diabetes mellitus, type 2)     Not at goal on tradjenta 5mg  daily.. Does not want to retry metformin. Has not tolerated glucotrol in past - would consider glimepiride. Check labwork today - if persistently elevated > 8% pt agrees to trial of amaryl. Will also schedule for diabetes education classes - pt has nutrition questions.  Relevant Medications      linagliptin (TRADJENTA) tablet   Other Relevant Orders      Basic metabolic panel (Completed)      Hemoglobin A1c (Completed)      Ambulatory referral to diabetic education   Depression - Primary     Persistently deteriorated despite recent vacation. Has not improved as would have been expected with end of stressful school year. Will check labwork to eval endorsed fatigue, if normal, will recommend adjuvant medication like wellbutrin. Compliant with effexor XR 225mg  daily. GAD7 = 2-->1 PHQ9 = 6-->7, somewhat difficult  to function        Follow up plan: Return in about 6 weeks (around 05/09/2014), or as needed, for follow up visit.

## 2014-03-29 LAB — BASIC METABOLIC PANEL
BUN: 8 mg/dL (ref 6–23)
CO2: 26 mEq/L (ref 19–32)
Calcium: 8.9 mg/dL (ref 8.4–10.5)
Chloride: 97 mEq/L (ref 96–112)
Creat: 0.68 mg/dL (ref 0.50–1.10)
Glucose, Bld: 198 mg/dL — ABNORMAL HIGH (ref 70–99)
Potassium: 4 mEq/L (ref 3.5–5.3)
Sodium: 136 mEq/L (ref 135–145)

## 2014-03-29 LAB — HEMOGLOBIN A1C
Hgb A1c MFr Bld: 8.7 % — ABNORMAL HIGH (ref <5.7)
Mean Plasma Glucose: 203 mg/dL — ABNORMAL HIGH (ref <117)

## 2014-03-29 LAB — TSH: TSH: 2.351 u[IU]/mL (ref 0.350–4.500)

## 2014-03-29 LAB — VITAMIN B12: Vitamin B-12: 467 pg/mL (ref 211–911)

## 2014-03-29 NOTE — Assessment & Plan Note (Addendum)
Persistently deteriorated despite recent vacation. Has not improved as would have been expected with end of stressful school year. Will check labwork to eval endorsed fatigue, if normal, will recommend adjuvant medication like wellbutrin. Compliant with effexor XR 225mg  daily. GAD7 = 2-->1 PHQ9 = 6-->7, somewhat difficult to function

## 2014-03-29 NOTE — Assessment & Plan Note (Signed)
Check for reversible causes today given persistent symptoms.

## 2014-03-29 NOTE — Assessment & Plan Note (Addendum)
Not at goal on tradjenta 5mg  daily.. Does not want to retry metformin. Has not tolerated glucotrol in past - would consider glimepiride. Check labwork today - if persistently elevated > 8% pt agrees to trial of amaryl. Will also schedule for diabetes education classes - pt has nutrition questions.

## 2014-03-31 ENCOUNTER — Ambulatory Visit: Payer: BC Managed Care – PPO | Admitting: Internal Medicine

## 2014-03-31 ENCOUNTER — Other Ambulatory Visit: Payer: Self-pay | Admitting: Family Medicine

## 2014-03-31 MED ORDER — BUPROPION HCL 75 MG PO TABS: 75.0000 mg | ORAL_TABLET | Freq: Two times a day (BID) | ORAL | Status: DC

## 2014-03-31 MED ORDER — GLIMEPIRIDE 2 MG PO TABS: 2.0000 mg | ORAL_TABLET | Freq: Every day | ORAL | Status: DC

## 2014-04-03 ENCOUNTER — Ambulatory Visit: Payer: BC Managed Care – PPO | Admitting: Psychology

## 2014-04-10 ENCOUNTER — Ambulatory Visit (INDEPENDENT_AMBULATORY_CARE_PROVIDER_SITE_OTHER): Payer: BC Managed Care – PPO | Admitting: Psychology

## 2014-04-10 DIAGNOSIS — F331 Major depressive disorder, recurrent, moderate: Secondary | ICD-10-CM

## 2014-04-19 ENCOUNTER — Other Ambulatory Visit: Payer: Self-pay | Admitting: Internal Medicine

## 2014-04-22 ENCOUNTER — Ambulatory Visit (INDEPENDENT_AMBULATORY_CARE_PROVIDER_SITE_OTHER): Payer: BC Managed Care – PPO | Admitting: Psychology

## 2014-04-22 DIAGNOSIS — F331 Major depressive disorder, recurrent, moderate: Secondary | ICD-10-CM

## 2014-04-25 ENCOUNTER — Other Ambulatory Visit: Payer: Self-pay | Admitting: Internal Medicine

## 2014-05-08 ENCOUNTER — Ambulatory Visit (INDEPENDENT_AMBULATORY_CARE_PROVIDER_SITE_OTHER): Payer: BC Managed Care – PPO | Admitting: Psychology

## 2014-05-08 DIAGNOSIS — F331 Major depressive disorder, recurrent, moderate: Secondary | ICD-10-CM

## 2014-05-09 ENCOUNTER — Encounter (INDEPENDENT_AMBULATORY_CARE_PROVIDER_SITE_OTHER): Payer: Self-pay

## 2014-05-09 ENCOUNTER — Ambulatory Visit (INDEPENDENT_AMBULATORY_CARE_PROVIDER_SITE_OTHER): Payer: BC Managed Care – PPO | Admitting: Family Medicine

## 2014-05-09 ENCOUNTER — Encounter: Payer: Self-pay | Admitting: Family Medicine

## 2014-05-09 VITALS — BP 124/78 | HR 88 | Temp 98.3°F | Wt 265.8 lb

## 2014-05-09 DIAGNOSIS — E1165 Type 2 diabetes mellitus with hyperglycemia: Secondary | ICD-10-CM

## 2014-05-09 DIAGNOSIS — F3289 Other specified depressive episodes: Secondary | ICD-10-CM

## 2014-05-09 DIAGNOSIS — F329 Major depressive disorder, single episode, unspecified: Secondary | ICD-10-CM

## 2014-05-09 DIAGNOSIS — F32A Depression, unspecified: Secondary | ICD-10-CM

## 2014-05-09 DIAGNOSIS — IMO0001 Reserved for inherently not codable concepts without codable children: Secondary | ICD-10-CM

## 2014-05-09 DIAGNOSIS — IMO0002 Reserved for concepts with insufficient information to code with codable children: Secondary | ICD-10-CM

## 2014-05-09 NOTE — Progress Notes (Signed)
Pre visit review using our clinic review tool, if applicable. No additional management support is needed unless otherwise documented below in the visit note. 

## 2014-05-09 NOTE — Patient Instructions (Addendum)
I'm glad some things are improving. I'm glad sugars are better. Let's decrease effexor to 150mg  XR once daily. Will work on slow taper off this medicine if tolerated. Continue wellbutrin, tradjenta and amaryl as up to now. Good to see you today, call us with quesitons. Call me in 1 month with update on effexor. Return to see me in 2 months for follow up and labwork.
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
## 2014-05-09 NOTE — Assessment & Plan Note (Signed)
May be improved? with addition of wellbutrin so will continue this. Pt desires slow taper off effexor XR and I agree. Will decrease to 150mg  daily for now, reassess next visit. Continue to see Dr. Rexene Edison.

## 2014-05-09 NOTE — Assessment & Plan Note (Signed)
Compliant with tradjenta 5mg  daily and amaryl 2mg  daily. Noted improvement in fasting sugars which she checks regularly. Will continue current regimen, recheck A1c next lab visit. I asked her to return fasting for further labwork as well including FLP.

## 2014-05-12 ENCOUNTER — Encounter: Payer: Self-pay | Admitting: *Deleted

## 2014-05-12 ENCOUNTER — Encounter: Payer: BC Managed Care – PPO | Attending: Family Medicine | Admitting: *Deleted

## 2014-05-12 VITALS — Ht 64.0 in | Wt 266.3 lb

## 2014-05-12 DIAGNOSIS — E119 Type 2 diabetes mellitus without complications: Secondary | ICD-10-CM | POA: Insufficient documentation

## 2014-05-12 DIAGNOSIS — E1165 Type 2 diabetes mellitus with hyperglycemia: Secondary | ICD-10-CM

## 2014-05-12 DIAGNOSIS — IMO0002 Reserved for concepts with insufficient information to code with codable children: Secondary | ICD-10-CM

## 2014-05-12 DIAGNOSIS — Z713 Dietary counseling and surveillance: Secondary | ICD-10-CM | POA: Insufficient documentation

## 2014-05-12 NOTE — Progress Notes (Signed)
  Medical Nutrition Therapy:  Appt start time: 0800 end time:  0900.  Assessment:  Patient here today for diabetes education. She has a type 1 diabetic at home, who she cares for, so she is familiar with carbohydrate counting, etc. However, she does not always follow this herself. She generally eats a healthy, balanced diet, but has a fairly high intake of sweets and drinks regular soda or sweet tea most days (large portions, 32 ounces), A1c on 7/10 was 8.7%. AM blood glucose ranges from 123-173, averaging 140-150s.   MEDICATIONS: Glimepiride AM, Tradjenta PM   DIETARY INTAKE:   Usual eating pattern includes 3 meals and 2-3 snacks per day.  24-hr recall:  B ( AM): 1 cup Special K cereal, 1/2 tbsp sugar, 12 oz milk, occasionally banana, coffee with 1/2 tbsp sugar/soda Snk ( AM): None  L ( PM): Sandwich on flat bread (lunchmeat, cheese, mayo), small bag chips, water/soda Snk ( PM): pretzels, cookies D ( PM): Meat, vegetable (green beans, broccoli), starch (corn, mashed potatoes), water Snk ( PM): pretzels, ice cream Beverages: Water, soda, sweet tea  Usual physical activity: None  Estimated energy needs: 1500 calories 169 g carbohydrates 94 g protein 50 g fat  Progress Towards Goal(s):  In progress.   Nutritional Diagnosis:  NB-1.1 Food and nutrition-related knowledge deficit As related to diabetes.  As evidenced by A1c 8.7%.    Intervention:  Nutrition counseling. We discussed basic carb counting, including foods with carbs, label reading, portion size, and meal planning.   Goals:  1. 2-3 servings of carbs per meal, 1 serving per snack 2. Decrease intake of sweetened drinks, limiting to 12 ounce can 1-2 times weekly or try water flavorings 3. Decrease intake of high carb snacks. Pay attention to hunger cues. If hungry, plan for healthy snack. 4. Consider increasing activity, walking 15-20 minutes after a meal.   Handouts given during visit include:  Meal plan  card  Monitoring/Evaluation:  Dietary intake, exercise, A1c, and body weight prn.

## 2014-05-22 ENCOUNTER — Ambulatory Visit (INDEPENDENT_AMBULATORY_CARE_PROVIDER_SITE_OTHER): Payer: BC Managed Care – PPO | Admitting: Psychology

## 2014-05-22 DIAGNOSIS — F331 Major depressive disorder, recurrent, moderate: Secondary | ICD-10-CM

## 2014-05-30 ENCOUNTER — Telehealth: Payer: Self-pay | Admitting: Family Medicine

## 2014-05-30 MED ORDER — VENLAFAXINE HCL ER 75 MG PO CP24: 75.0000 mg | ORAL_CAPSULE | Freq: Every day | ORAL | Status: DC

## 2014-05-30 NOTE — Telephone Encounter (Signed)
Pt calling to give you update on her meds. Pt states "its not been a good week", crying, anxiety, feels overwhelmed. Please advise,  Thank you!

## 2014-05-30 NOTE — Telephone Encounter (Signed)
I'm sorry she's not doing well. This could be side effects from coming off venlafaxine as this medication is more difficult to come off of.  Would suggest she return to higher 225mg  dose for now - have sent 75mg  dose to pharmacy to take along with 150mg  dose. Then once she's feeling better, may retry slow taper by alternating venlafaxine 225 with 150mg  every other day Continue wellbutrin and ativan as up to now.

## 2014-05-30 NOTE — Telephone Encounter (Signed)
Patient notified and verbalized understanding. 

## 2014-05-30 NOTE — Telephone Encounter (Signed)
Was going to a church event on Sunday and was very anxious prior to attending it. Took 1mg  of ativan prior. 2 hours in she was crying, anxious, overwhelmed and very impatient. This has happened 2-3 days this week. No SI/HI. Can't afford co-pay. Wonders if it's her body trying to adjust coming off of the venlafaxine and asks if you have any suggestions. She feels very overwhelmed and doesn't like how she is feeling right now.

## 2014-06-05 ENCOUNTER — Ambulatory Visit (INDEPENDENT_AMBULATORY_CARE_PROVIDER_SITE_OTHER): Payer: BC Managed Care – PPO | Admitting: Psychology

## 2014-06-05 DIAGNOSIS — F331 Major depressive disorder, recurrent, moderate: Secondary | ICD-10-CM

## 2014-06-11 ENCOUNTER — Ambulatory Visit (INDEPENDENT_AMBULATORY_CARE_PROVIDER_SITE_OTHER): Payer: BC Managed Care – PPO | Admitting: Psychology

## 2014-06-11 DIAGNOSIS — F331 Major depressive disorder, recurrent, moderate: Secondary | ICD-10-CM

## 2014-06-17 ENCOUNTER — Other Ambulatory Visit: Payer: Self-pay | Admitting: *Deleted

## 2014-06-17 ENCOUNTER — Ambulatory Visit (INDEPENDENT_AMBULATORY_CARE_PROVIDER_SITE_OTHER): Payer: BC Managed Care – PPO | Admitting: Psychology

## 2014-06-17 DIAGNOSIS — F331 Major depressive disorder, recurrent, moderate: Secondary | ICD-10-CM

## 2014-06-17 MED ORDER — DEXLANSOPRAZOLE 60 MG PO CPDR: DELAYED_RELEASE_CAPSULE | ORAL | Status: DC

## 2014-06-21 ENCOUNTER — Emergency Department (HOSPITAL_COMMUNITY)
Admission: EM | Admit: 2014-06-21 | Discharge: 2014-06-21 | Disposition: A | Discharge status: Home / Self Care | Payer: BC Managed Care – PPO | Attending: Emergency Medicine | Admitting: Emergency Medicine

## 2014-06-21 ENCOUNTER — Encounter (HOSPITAL_COMMUNITY): Payer: Self-pay | Admitting: Emergency Medicine

## 2014-06-21 DIAGNOSIS — E119 Type 2 diabetes mellitus without complications: Secondary | ICD-10-CM | POA: Insufficient documentation

## 2014-06-21 DIAGNOSIS — K529 Noninfective gastroenteritis and colitis, unspecified: Secondary | ICD-10-CM | POA: Diagnosis not present

## 2014-06-21 DIAGNOSIS — F329 Major depressive disorder, single episode, unspecified: Secondary | ICD-10-CM | POA: Insufficient documentation

## 2014-06-21 DIAGNOSIS — Z79899 Other long term (current) drug therapy: Secondary | ICD-10-CM | POA: Diagnosis not present

## 2014-06-21 DIAGNOSIS — K219 Gastro-esophageal reflux disease without esophagitis: Secondary | ICD-10-CM | POA: Insufficient documentation

## 2014-06-21 DIAGNOSIS — R112 Nausea with vomiting, unspecified: Secondary | ICD-10-CM | POA: Diagnosis present

## 2014-06-21 LAB — CBC WITH DIFFERENTIAL/PLATELET
Basophils Absolute: 0 10*3/uL (ref 0.0–0.1)
Basophils Relative: 0 % (ref 0–1)
Eosinophils Absolute: 0.1 10*3/uL (ref 0.0–0.7)
Eosinophils Relative: 1 % (ref 0–5)
HCT: 40.1 % (ref 36.0–46.0)
Hemoglobin: 13.3 g/dL (ref 12.0–15.0)
Lymphocytes Relative: 8 % — ABNORMAL LOW (ref 12–46)
Lymphs Abs: 1.1 10*3/uL (ref 0.7–4.0)
MCH: 28 pg (ref 26.0–34.0)
MCHC: 33.2 g/dL (ref 30.0–36.0)
MCV: 84.4 fL (ref 78.0–100.0)
Monocytes Absolute: 0.5 10*3/uL (ref 0.1–1.0)
Monocytes Relative: 4 % (ref 3–12)
Neutro Abs: 11.5 10*3/uL — ABNORMAL HIGH (ref 1.7–7.7)
Neutrophils Relative %: 87 % — ABNORMAL HIGH (ref 43–77)
Platelets: 336 10*3/uL (ref 150–400)
RBC: 4.75 MIL/uL (ref 3.87–5.11)
RDW: 14.9 % (ref 11.5–15.5)
WBC: 13.1 10*3/uL — ABNORMAL HIGH (ref 4.0–10.5)

## 2014-06-21 LAB — COMPREHENSIVE METABOLIC PANEL
ALT: 20 U/L (ref 0–35)
AST: 31 U/L (ref 0–37)
Albumin: 3.7 g/dL (ref 3.5–5.2)
Alkaline Phosphatase: 114 U/L (ref 39–117)
Anion gap: 18 — ABNORMAL HIGH (ref 5–15)
BUN: 9 mg/dL (ref 6–23)
CO2: 19 mEq/L (ref 19–32)
Calcium: 8.8 mg/dL (ref 8.4–10.5)
Chloride: 100 mEq/L (ref 96–112)
Creatinine, Ser: 0.68 mg/dL (ref 0.50–1.10)
GFR calc Af Amer: >90 mL/min (ref >90)
GFR calc non Af Amer: >90 mL/min (ref >90)
Glucose, Bld: 192 mg/dL — ABNORMAL HIGH (ref 70–99)
Potassium: 3.8 mEq/L (ref 3.7–5.3)
Sodium: 137 mEq/L (ref 137–147)
Total Bilirubin: 0.4 mg/dL (ref 0.3–1.2)
Total Protein: 7.9 g/dL (ref 6.0–8.3)

## 2014-06-21 MED ORDER — GI COCKTAIL ~~LOC~~
30.0000 mL | Freq: Once | ORAL | Status: AC
  Administered 2014-06-21: 30 mL via ORAL
  Filled 2014-06-21: qty 30

## 2014-06-21 MED ORDER — PROMETHAZINE HCL 25 MG/ML IJ SOLN
25.0000 mg | Freq: Once | INTRAMUSCULAR | Status: AC
  Administered 2014-06-21: 25 mg via INTRAVENOUS
  Filled 2014-06-21: qty 1

## 2014-06-21 MED ORDER — PROMETHAZINE HCL 25 MG PO TABS: 25.0000 mg | ORAL_TABLET | Freq: Four times a day (QID) | ORAL | Status: DC | PRN

## 2014-06-21 MED ORDER — ONDANSETRON HCL 4 MG/2ML IJ SOLN
4.0000 mg | Freq: Once | INTRAMUSCULAR | Status: AC
  Administered 2014-06-21: 4 mg via INTRAVENOUS
  Filled 2014-06-21: qty 2

## 2014-06-21 MED ORDER — SODIUM CHLORIDE 0.9 % IV BOLUS (SEPSIS)
1000.0000 mL | Freq: Once | INTRAVENOUS | Status: AC
  Administered 2014-06-21: 1000 mL via INTRAVENOUS

## 2014-06-21 NOTE — ED Notes (Signed)
Pt states that she began vomiting approx 24 hrs ago; pt states that she has vomited 6-8 times in the last 6 hrs; pt states that the diarrhea began around 6pm; pt reports 6-8 episodes of diarrhea; pt c/o mild abd cramping associated with V/D; pt denies blood to vomit or stool

## 2014-06-21 NOTE — ED Provider Notes (Addendum)
CSN: [id number]     Arrival date & time 06/21/14  0223 History   First MD Initiated Contact with Patient 06/21/14 0255     Chief Complaint  Patient presents with  . Emesis  . Diarrhea     (Consider location/radiation/quality/duration/timing/severity/associated sxs/prior Treatment) HPI Patient presents with one day of vomiting and diarrhea. She's had 6-8 episodes each that has increased in frequency this evening. Chest mild abdominal cramping associated but denies any current abdominal pain. No blood in the vomit or stool. No known sick contacts. No recent travel. No fever or chills. Past Medical History  Diagnosis Date  . Depression   . Diabetes mellitus without complication     DSME 01/2840  . Allergy   . GERD (gastroesophageal reflux disease)   . Hyperlipidemia   . Barrett esophagus    Past Surgical History  Procedure Laterality Date  . Cesarean section  6/95, 4/98  . Back surgery  03/1995  . Nasal sinus surgery  03/2001  . Dilation and curettage of uterus  2011   Family History  Problem Relation Age of Onset  . Breast cancer Maternal Grandmother   . Heart disease Paternal Grandfather    History  Substance Use Topics  . Smoking status: Never Smoker   . Smokeless tobacco: Never Used  . Alcohol Use: No   OB History   Grav Para Term Preterm Abortions TAB SAB Ect Mult Living                 Review of Systems  Constitutional: Negative for fever and chills.  Respiratory: Negative for chest tightness and shortness of breath.   Cardiovascular: Negative for chest pain.  Gastrointestinal: Positive for nausea, vomiting and diarrhea. Negative for abdominal pain and blood in stool.  Genitourinary: Negative for dysuria, frequency and flank pain.  Musculoskeletal: Negative for back pain, neck pain and neck stiffness.  Skin: Negative for rash and wound.  Neurological: Negative for dizziness, weakness, light-headedness and headaches.  All other systems reviewed and are  negative.     Allergies  Biaxin and Metformin and related  Home Medications   Prior to Admission medications   Medication Sig Start Date End Date Taking? Authorizing Provider  buPROPion (WELLBUTRIN) 75 MG tablet Take 1 tablet (75 mg total) by mouth 2 (two) times daily. 03/31/14   Ria Bush, MD  dexlansoprazole (DEXILANT) 60 MG capsule TAKE ONE CAPSULE BY MOUTH EVERY DAY 06/17/14   Ria Bush, MD  glimepiride (AMARYL) 2 MG tablet Take 1 tablet (2 mg total) by mouth daily with breakfast. 03/31/14   Ria Bush, MD  glucose blood (ONE TOUCH TEST STRIPS) test strip 1 each by Other route as needed for other. Use as instructed 11/20/13   Jearld Fenton, NP  linagliptin (TRADJENTA) 5 MG TABS tablet Take 1 tablet (5 mg total) by mouth daily. 03/28/14   Ria Bush, MD  LORazepam (ATIVAN) 0.5 MG tablet Take 1 tablet (0.5 mg total) by mouth 2 (two) times daily as needed for anxiety. 02/27/14   Ria Bush, MD  venlafaxine XR (EFFEXOR XR) 75 MG 24 hr capsule Take 1 capsule (75 mg total) by mouth daily with breakfast. 05/30/14   Ria Bush, MD  venlafaxine XR (EFFEXOR-XR) 150 MG 24 hr capsule Take 150 mg by mouth daily with breakfast.    Historical Provider, MD   BP 146/84  Pulse 98  Temp(Src) 98.6 F (37 C) (Oral)  Resp 20  Wt 270 lb (122.471 kg)  SpO2 96%  LMP 06/01/2014 Physical Exam  Nursing note and vitals reviewed. Constitutional: She is oriented to person, place, and time. She appears well-developed and well-nourished. No distress.  HENT:  Head: Normocephalic and atraumatic.  Mouth/Throat: Oropharynx is clear and moist.  Eyes: EOM are normal. Pupils are equal, round, and reactive to light.  Neck: Normal range of motion. Neck supple.  Cardiovascular: Normal rate and regular rhythm.   Pulmonary/Chest: Effort normal and breath sounds normal. No respiratory distress. She has no wheezes. She has no rales.  Abdominal: Soft. Bowel sounds are normal. She exhibits  no distension and no mass. There is no tenderness. There is no rebound and no guarding.  Musculoskeletal: Normal range of motion. She exhibits no edema and no tenderness.  Neurological: She is alert and oriented to person, place, and time.  Skin: Skin is warm and dry. No rash noted. No erythema.  Psychiatric: She has a normal mood and affect. Her behavior is normal.    ED Course  Procedures (including critical care time) Labs Review Labs Reviewed  CBC WITH DIFFERENTIAL  COMPREHENSIVE METABOLIC PANEL    Imaging Review No results found.   EKG Interpretation None      MDM   Final diagnoses:  None      No further vomiting in the emergency department. Tolerating oral intake. Return precautions given.  Julianne Rice, MD 06/21/14 2446  Julianne Rice, MD 06/21/14 [phone]

## 2014-06-21 NOTE — Discharge Instructions (Signed)

## 2014-06-23 ENCOUNTER — Telehealth: Payer: Self-pay | Admitting: *Deleted

## 2014-06-23 DIAGNOSIS — K219 Gastro-esophageal reflux disease without esophagitis: Secondary | ICD-10-CM

## 2014-06-23 NOTE — Telephone Encounter (Signed)
Received prior auth request for Dexilant from pharmacy. Per pt she has tried and failed Nexium, omeprazole, and protonix in the past. I submitted prior auth electronically through cover my meds. I placed a copy of the submitted forms in Kim's prior auth pending folder.

## 2014-06-26 NOTE — Telephone Encounter (Signed)
PA denied. In your IN box for review.

## 2014-06-28 NOTE — Telephone Encounter (Signed)
Noted. plz check with patient - has she tried lansoprazole (prevacid) in past? If not insurance requires trying this med prior to approving dexilant - needs to fail 4 PPIs first. May send in if patient willing to try this - lansoprazole 30mg  daily #30 with 1 RF.

## 2014-07-01 NOTE — Telephone Encounter (Signed)
Spoke with patient and she advised she has tried prevacid and it didn't work either. She has also tried Zantac 150, with very minimal relief (not enough to even consider it relief).

## 2014-07-02 NOTE — Telephone Encounter (Signed)
Resubmitted. Will await determination.

## 2014-07-02 NOTE — Telephone Encounter (Signed)
plz submit new PA with all 4 meds tried and failed. Nexium, omeprazole, protonix, prevacid.

## 2014-07-03 MED ORDER — DEXLANSOPRAZOLE 60 MG PO CPDR: 60.0000 mg | DELAYED_RELEASE_CAPSULE | Freq: Every day | ORAL | Status: DC

## 2014-07-03 NOTE — Telephone Encounter (Signed)
Dexilant approved. Patient and pharmacy notified. Refilled Rx.

## 2014-07-12 ENCOUNTER — Other Ambulatory Visit: Payer: Self-pay | Admitting: Family Medicine

## 2014-07-14 ENCOUNTER — Ambulatory Visit (INDEPENDENT_AMBULATORY_CARE_PROVIDER_SITE_OTHER): Payer: BC Managed Care – PPO | Admitting: Family Medicine

## 2014-07-14 ENCOUNTER — Encounter: Payer: Self-pay | Admitting: Family Medicine

## 2014-07-14 VITALS — BP 122/82 | HR 68 | Temp 98.0°F | Wt 267.5 lb

## 2014-07-14 DIAGNOSIS — Z23 Encounter for immunization: Secondary | ICD-10-CM

## 2014-07-14 DIAGNOSIS — F32A Depression, unspecified: Secondary | ICD-10-CM

## 2014-07-14 DIAGNOSIS — F329 Major depressive disorder, single episode, unspecified: Secondary | ICD-10-CM

## 2014-07-14 DIAGNOSIS — E1165 Type 2 diabetes mellitus with hyperglycemia: Secondary | ICD-10-CM

## 2014-07-14 DIAGNOSIS — IMO0002 Reserved for concepts with insufficient information to code with codable children: Secondary | ICD-10-CM

## 2014-07-14 LAB — BASIC METABOLIC PANEL
BUN: 7 mg/dL (ref 6–23)
CO2: 23 mEq/L (ref 19–32)
Calcium: 8.6 mg/dL (ref 8.4–10.5)
Chloride: 100 mEq/L (ref 96–112)
Creatinine, Ser: 0.8 mg/dL (ref 0.4–1.2)
GFR: 82.72 mL/min (ref >60.00)
Glucose, Bld: 155 mg/dL — ABNORMAL HIGH (ref 70–99)
Potassium: 3.7 mEq/L (ref 3.5–5.1)
Sodium: 137 mEq/L (ref 135–145)

## 2014-07-14 LAB — MICROALBUMIN / CREATININE URINE RATIO
Creatinine,U: 166.5 mg/dL
Microalb Creat Ratio: 1.1 mg/g (ref 0.0–30.0)
Microalb, Ur: 1.8 mg/dL (ref 0.0–1.9)

## 2014-07-14 LAB — HEMOGLOBIN A1C: Hgb A1c MFr Bld: 7.2 % — ABNORMAL HIGH (ref 4.6–6.5)

## 2014-07-14 NOTE — Assessment & Plan Note (Signed)
Continue current regimen through holidays then reasses

## 2014-07-14 NOTE — Assessment & Plan Note (Signed)
Chronic, stable. Check labs today. If A1c elevated, discussed increase in amaryl. Pt agrees with plan.

## 2014-07-14 NOTE — Patient Instructions (Addendum)
Pneumovax today. Let's continue current doses of wellbutrin and effexor XR through holiday season.  Then if you would like, we may try again a slower effexor taper. Good to see you today, return as needed or in 3-4 months for follow up. Blood work today.

## 2014-07-14 NOTE — Progress Notes (Signed)
BP 122/82  Pulse 68  Temp(Src) 98 F (36.7 C) (Oral)  Wt 267 lb 8 oz (121.337 kg)  LMP 06/23/2014   CC: 2 mo f/u  Subjective:    Patient ID: Lisa Curry, female    DOB: 1971/02/09, 43 y.o.   MRN: [id number]  HPI: Lisa Curry is a 43 y.o. female presenting on 07/14/2014 for Follow-up   DM - regularly does check sugars but not in last week. 138-160 fasting.  Compliant with antihyperglycemic regimen which includes: amaryl 2mg  in am, tradjenta 5mg  in pm.  Denies low sugars or hypoglycemic symptoms.  Denies paresthesias. Last diabetic eye exam DUE.  Pneumovax: DUE.  Prevnar: not due. Due for f/u.  Lab Results  Component Value Date   HGBA1C 8.7* 03/28/2014   Has seen nutritionist for diabetes refresher (04/2014). Wt Readings from Last 3 Encounters:  07/14/14 267 lb 8 oz (121.337 kg)  06/21/14 270 lb (122.471 kg)  05/12/14 266 lb 4.8 oz (120.793 kg)    Depression - effexor 150mg  XR, wellubtrin 75mg  bid added 04/2014. Sees Dr Pervis Hocking counselor. Decided not to return to work. 2 wks after effexor taper started feeling worse. Back up to 225mg  XR daily. She tends to have some trouble through Christmas. Now on current dose feeling more stable, albeit notices irritability.   Recently evaluated at ER with acute gastroenteritis - feeling better from this standpoint.  Relevant past medical, surgical, family and social history reviewed and updated as indicated.  Allergies and medications reviewed and updated. Current Outpatient Prescriptions on File Prior to Visit  Medication Sig  . buPROPion (WELLBUTRIN) 75 MG tablet Take 1 tablet (75 mg total) by mouth 2 (two) times daily.  Marland Kitchen dexlansoprazole (DEXILANT) 60 MG capsule Take 1 capsule (60 mg total) by mouth daily.  Marland Kitchen glimepiride (AMARYL) 2 MG tablet Take 1 tablet (2 mg total) by mouth daily with breakfast.  . linagliptin (TRADJENTA) 5 MG TABS tablet Take 1 tablet (5 mg total) by mouth daily.  Marland Kitchen LORazepam (ATIVAN) 0.5 MG tablet Take 1 tablet  (0.5 mg total) by mouth 2 (two) times daily as needed for anxiety.  Marland Kitchen venlafaxine XR (EFFEXOR XR) 75 MG 24 hr capsule Take 1 capsule (75 mg total) by mouth daily with breakfast.  . venlafaxine XR (EFFEXOR-XR) 150 MG 24 hr capsule Take 150 mg by mouth daily with breakfast.  . promethazine (PHENERGAN) 25 MG tablet Take 1 tablet (25 mg total) by mouth every 6 (six) hours as needed for nausea or vomiting.  . ranitidine (ZANTAC) 150 MG tablet Take 150 mg by mouth 2 (two) times daily.   No current facility-administered medications on file prior to visit.    Review of Systems Per HPI unless specifically indicated above    Objective:    BP 122/82  Pulse 68  Temp(Src) 98 F (36.7 C) (Oral)  Wt 267 lb 8 oz (121.337 kg)  LMP 06/23/2014  Physical Exam  Nursing note and vitals reviewed. Constitutional: She appears well-developed and well-nourished. No distress.  HENT:  Head: Normocephalic and atraumatic.  Right Ear: External ear normal.  Left Ear: External ear normal.  Nose: Nose normal.  Mouth/Throat: Oropharynx is clear and moist. No oropharyngeal exudate.  Eyes: Conjunctivae and EOM are normal. Pupils are equal, round, and reactive to light. No scleral icterus.  Neck: Normal range of motion. Neck supple.  Cardiovascular: Normal rate, regular rhythm, normal heart sounds and intact distal pulses.   No murmur heard. Pulmonary/Chest: Effort normal and  breath sounds normal. No respiratory distress. She has no wheezes. She has no rales.  Musculoskeletal: She exhibits no edema.  Diabetic foot exam: Normal inspection No skin breakdown No calluses  Normal DP/PT pulses Normal sensation to light tough and monofilament Nails normal  Lymphadenopathy:    She has no cervical adenopathy.  Skin: Skin is warm and dry. No rash noted.  Psychiatric: She has a normal mood and affect.   Results for orders placed in visit on 07/14/14  HM DIABETES EYE EXAM      Result Value Ref Range   HM Diabetic Eye  Exam No Retinopathy  No Retinopathy      Assessment & Plan:   Problem List Items Addressed This Visit   Diabetes mellitus type 2, uncontrolled - Primary     Chronic, stable. Check labs today. If A1c elevated, discussed increase in amaryl. Pt agrees with plan.    Relevant Orders      HM DIABETES FOOT EXAM (Completed)      Hemoglobin A1c      Basic metabolic panel      Microalbumin / creatinine urine ratio   Depression     Continue current regimen through holidays then reasses        Follow up plan: Return in about 3 months (around 10/14/2014), or as needed, for f/u visit.

## 2014-07-14 NOTE — Addendum Note (Signed)
Addended by: Royann Shivers A on: 07/14/2014 08:42 AM   Modules accepted: Orders

## 2014-07-14 NOTE — Progress Notes (Signed)
Pre visit review using our clinic review tool, if applicable. No additional management support is needed unless otherwise documented below in the visit note. 

## 2014-07-15 ENCOUNTER — Other Ambulatory Visit: Payer: Self-pay | Admitting: Family Medicine

## 2014-07-15 MED ORDER — GLIMEPIRIDE 4 MG PO TABS: 4.0000 mg | ORAL_TABLET | Freq: Every day | ORAL | Status: DC

## 2014-07-17 ENCOUNTER — Ambulatory Visit: Payer: BC Managed Care – PPO | Admitting: Psychology

## 2014-07-23 ENCOUNTER — Ambulatory Visit (INDEPENDENT_AMBULATORY_CARE_PROVIDER_SITE_OTHER): Payer: BC Managed Care – PPO | Admitting: Psychology

## 2014-07-23 DIAGNOSIS — F332 Major depressive disorder, recurrent severe without psychotic features: Secondary | ICD-10-CM

## 2014-07-29 ENCOUNTER — Ambulatory Visit (INDEPENDENT_AMBULATORY_CARE_PROVIDER_SITE_OTHER): Payer: BC Managed Care – PPO | Admitting: Psychology

## 2014-07-29 DIAGNOSIS — F332 Major depressive disorder, recurrent severe without psychotic features: Secondary | ICD-10-CM

## 2014-07-30 ENCOUNTER — Ambulatory Visit: Payer: BC Managed Care – PPO | Admitting: Psychology

## 2014-08-01 ENCOUNTER — Encounter (HOSPITAL_COMMUNITY): Payer: Self-pay | Admitting: *Deleted

## 2014-08-01 ENCOUNTER — Emergency Department (HOSPITAL_COMMUNITY)
Admission: EM | Admit: 2014-08-01 | Discharge: 2014-08-01 | Disposition: A | Discharge status: Home / Self Care | Payer: BC Managed Care – PPO | Source: Home / Self Care | Attending: Family Medicine | Admitting: Family Medicine

## 2014-08-01 ENCOUNTER — Telehealth: Payer: Self-pay | Admitting: Family Medicine

## 2014-08-01 DIAGNOSIS — S31831A Laceration without foreign body of anus, initial encounter: Secondary | ICD-10-CM

## 2014-08-01 MED ORDER — HYDROCORTISONE ACETATE 25 MG RE SUPP: 25.0000 mg | Freq: Two times a day (BID) | RECTAL | Status: DC | PRN

## 2014-08-01 NOTE — ED Notes (Signed)
Pt has  Rectal  Bleeding  For  sev  Days    - she  Reports  She  Has   What  She  Describes  As  A  Bump  Protruding  From  Rectum     -  She  denys  Any  Unusual       Bowel  Changes     She      Reports  She notices      Blood    When  She  Wipes  From  Front  To back  After  Urination

## 2014-08-01 NOTE — ED Provider Notes (Signed)
CSN: [id number]     Arrival date & time 08/01/14  1205 History   First MD Initiated Contact with Patient 08/01/14 1228     Chief Complaint  Patient presents with  . Rectal Bleeding   (Consider location/radiation/quality/duration/timing/severity/associated sxs/prior Treatment) Patient is a 43 y.o. female presenting with hematochezia. The history is provided by the patient.  Rectal Bleeding Quality:  Bright red Amount:  Moderate Duration:  3 days Progression:  Improving Chronicity:  New Context: not anal fissures, not anal penetration, not constipation, not defecation, not diarrhea, not hemorrhoids and not rectal pain   Similar prior episodes: no   Associated symptoms: no abdominal pain     Past Medical History  Diagnosis Date  . Depression   . Diabetes mellitus without complication     DSME 02/7123  . Allergy   . GERD (gastroesophageal reflux disease)   . Hyperlipidemia   . Barrett esophagus    Past Surgical History  Procedure Laterality Date  . Cesarean section  6/95, 4/98  . Back surgery  03/1995  . Nasal sinus surgery  03/2001  . Dilation and curettage of uterus  2011   Family History  Problem Relation Age of Onset  . Breast cancer Maternal Grandmother   . Heart disease Paternal Grandfather    History  Substance Use Topics  . Smoking status: Never Smoker   . Smokeless tobacco: Never Used  . Alcohol Use: No   OB History    No data available     Review of Systems  Constitutional: Negative.   Gastrointestinal: Positive for blood in stool, hematochezia and anal bleeding. Negative for abdominal pain, diarrhea, constipation and rectal pain.    Allergies  Biaxin and Metformin and related  Home Medications   Prior to Admission medications   Medication Sig Start Date End Date Taking? Authorizing Provider  buPROPion (WELLBUTRIN) 75 MG tablet Take 1 tablet (75 mg total) by mouth 2 (two) times daily. 03/31/14   Ria Bush, MD  dexlansoprazole (DEXILANT) 60 MG  capsule Take 1 capsule (60 mg total) by mouth daily. 07/03/14   Ria Bush, MD  glimepiride (AMARYL) 4 MG tablet Take 1 tablet (4 mg total) by mouth daily with breakfast. 07/15/14   Ria Bush, MD  hydrocortisone (ANUSOL-HC) 25 MG suppository Place 1 suppository (25 mg total) rectally 2 (two) times daily as needed for hemorrhoids or itching. 08/01/14   Billy Fischer, MD  linagliptin (TRADJENTA) 5 MG TABS tablet Take 1 tablet (5 mg total) by mouth daily. 03/28/14   Ria Bush, MD  LORazepam (ATIVAN) 0.5 MG tablet Take 1 tablet (0.5 mg total) by mouth 2 (two) times daily as needed for anxiety. 02/27/14   Ria Bush, MD  promethazine (PHENERGAN) 25 MG tablet Take 1 tablet (25 mg total) by mouth every 6 (six) hours as needed for nausea or vomiting. 06/21/14   Julianne Rice, MD  ranitidine (ZANTAC) 150 MG tablet Take 150 mg by mouth 2 (two) times daily.    Historical Provider, MD  venlafaxine XR (EFFEXOR XR) 75 MG 24 hr capsule Take 1 capsule (75 mg total) by mouth daily with breakfast. 05/30/14   Ria Bush, MD  venlafaxine XR (EFFEXOR-XR) 150 MG 24 hr capsule Take 150 mg by mouth daily with breakfast.    Historical Provider, MD   BP 130/83 mmHg  Pulse 93  Temp(Src) 98.2 F (36.8 C) (Oral)  Resp 16  SpO2 97%  LMP 07/23/2014 Physical Exam  Constitutional: She is oriented to person, place,  and time. She appears well-developed and well-nourished.  Abdominal: Soft. Bowel sounds are normal. She exhibits no distension and no mass. There is no tenderness. There is no rebound and no guarding.  Genitourinary: Rectum normal. Rectal exam shows no external hemorrhoid, no internal hemorrhoid, no fissure, no tenderness and anal tone normal.  Neurological: She is alert and oriented to person, place, and time.  Skin: Skin is warm and dry.  Nursing note and vitals reviewed.   ED Course  Procedures (including critical care time) Labs Review Labs Reviewed - No data to  display  Imaging Review No results found.   MDM   1. Tear of anal skin, initial encounter        Billy Fischer, MD 08/01/14 1246

## 2014-08-01 NOTE — Telephone Encounter (Signed)
Recommend urgent care - would suggest [facility] if pt agrees to go to [place]. Unfortunately I cannot see her today.

## 2014-08-01 NOTE — Telephone Encounter (Signed)
Patient notified and verbalized understanding. She said she would go to [facility] today.

## 2014-08-01 NOTE — Telephone Encounter (Signed)
Patient Information:  Caller Name: Lenya  Phone: [phone]  Patient: Lisa Curry  Gender: Female  DOB: Nov 09, 1970  Age: 43 Years  PCP: Ria Bush [facility])  Pregnant: No  Office Follow Up:  Does the office need to follow up with this patient?: Yes  Instructions For The Office: Office please review and call pt back with further instructions. No appt available. No appt at other locations.   Pt is having rectal bleeding.  It is a see in office today dispostion.  Pt aware that office will call her back with further instructions. If sx worsen prior to office calling her back; will call back.   Symptoms  Reason For Call & Symptoms: Pt is calling and states that she has rectal bleeding;  passing bright red blood without stool; will pass bright red blood from rectum even when she urinates; seems like she is on her menstrual cycle but from the rectum;  bleeding seems worse at night; bleeding episodes occur approx 4 times daily;  was never officially dx with hemorroids; can feel a protusion that is hanging out of rectum; LMP 07/22/2014  Reviewed Health History In EMR: Yes  Reviewed Medications In EMR: Yes  Reviewed Allergies In EMR: Yes  Reviewed Surgeries / Procedures: Yes  Date of Onset of Symptoms: 07/30/2014  Treatments Tried: Preparation H  Treatments Tried Worked: No OB / GYN:  LMP: 07/22/2014  Guideline(s) Used:  Rectal Bleeding  Disposition Per Guideline:   See Today in Office  Reason For Disposition Reached:   Blood passed alone without any stool  Advice Given:  General Instructions for Treating and Preventing Constipation:   Eat a high fiber diet.  Drink adequate liquids.  Warm SITZ Baths Twice a Day:   Sit in a warm saline bath for 20 minutes 2 times daily to cleanse the rectal area and to promote healing.  You can add 2 ounces (57 grams) of table salt or baking soda to each tub of water.  Call Back If:  Bleeding increases in amount  You become  worse.  Patient Will Follow Care Advice:  YES

## 2014-08-02 ENCOUNTER — Other Ambulatory Visit: Payer: Self-pay | Admitting: Family Medicine

## 2014-08-05 ENCOUNTER — Ambulatory Visit: Payer: BC Managed Care – PPO | Admitting: Psychology

## 2014-08-08 ENCOUNTER — Ambulatory Visit: Payer: BC Managed Care – PPO

## 2014-08-12 ENCOUNTER — Other Ambulatory Visit: Payer: Self-pay | Admitting: Family Medicine

## 2014-08-13 ENCOUNTER — Ambulatory Visit (INDEPENDENT_AMBULATORY_CARE_PROVIDER_SITE_OTHER): Payer: BC Managed Care – PPO | Admitting: Psychology

## 2014-08-13 DIAGNOSIS — F332 Major depressive disorder, recurrent severe without psychotic features: Secondary | ICD-10-CM

## 2014-09-10 ENCOUNTER — Ambulatory Visit (INDEPENDENT_AMBULATORY_CARE_PROVIDER_SITE_OTHER): Payer: BC Managed Care – PPO | Admitting: Psychology

## 2014-09-10 DIAGNOSIS — F332 Major depressive disorder, recurrent severe without psychotic features: Secondary | ICD-10-CM

## 2014-09-18 ENCOUNTER — Telehealth: Payer: Self-pay | Admitting: Family Medicine

## 2014-09-18 NOTE — Telephone Encounter (Signed)
Recommend Upreg test at home. No need for meds, just await period next month. If persistently irregular let us know

## 2014-09-18 NOTE — Telephone Encounter (Signed)
Patient says there is no way she is pregnant. She says her husband has had a vasectomy and they only have intercourse ~Q6 months and they haven't had it. Refused UPreg. She said she is very emotional and thinks if she could just have a period,she would feel better hormonally. She was in tears with me on the phone. I advised that we were already closed for the day, but I would resend the message.

## 2014-09-18 NOTE — Telephone Encounter (Signed)
Pt left vm on triage line stating that her period is 21 days late.  She denies pregnancy.  She wants to know if a medication can be prescribed to her to start her period.

## 2014-09-21 NOTE — Telephone Encounter (Signed)
Unfortunately I still don't recommend any medication to speed up period. This may be perimenopausal changes - average age of menopause is 56 but it can start much earlier. One of first symptoms is more irregular periods. Any hot flashes? Does she have any family history of early menopause? It also sounds like mood has worsened despite antidepressants. Does she think we need to make a change to her regimen of effexor and wellbutrin? Would offer office visit if pt desires and we can further discuss all of this. If pt remains very worried about late period, would offer GYN referral to further discuss but I don't think they would start any new med either.

## 2014-09-22 NOTE — Telephone Encounter (Signed)
Patient notified as instructed by telephone. Patient verbalized understanding. Patient stated that she has not had any hot flashes and does not have a family history of early menopause. Patient stated that she already has an upcoming appointment on 10/09/14 and will discuss this at that appointment. Patient stated that if she decides that she wants to be seen sooner she will call back.

## 2014-10-03 ENCOUNTER — Encounter: Payer: Self-pay | Admitting: Family Medicine

## 2014-10-03 ENCOUNTER — Ambulatory Visit (INDEPENDENT_AMBULATORY_CARE_PROVIDER_SITE_OTHER): Payer: Self-pay | Admitting: Family Medicine

## 2014-10-03 VITALS — BP 160/80 | HR 80 | Temp 98.3°F | Wt 269.0 lb

## 2014-10-03 DIAGNOSIS — N912 Amenorrhea, unspecified: Secondary | ICD-10-CM

## 2014-10-03 DIAGNOSIS — F418 Other specified anxiety disorders: Secondary | ICD-10-CM

## 2014-10-03 DIAGNOSIS — E1165 Type 2 diabetes mellitus with hyperglycemia: Secondary | ICD-10-CM

## 2014-10-03 DIAGNOSIS — IMO0002 Reserved for concepts with insufficient information to code with codable children: Secondary | ICD-10-CM

## 2014-10-03 LAB — POCT URINE PREGNANCY: Preg Test, Ur: NEGATIVE

## 2014-10-03 MED ORDER — CLONAZEPAM 0.5 MG PO TABS: ORAL_TABLET | ORAL | Status: DC

## 2014-10-03 NOTE — Progress Notes (Signed)
Pre visit review using our clinic review tool, if applicable. No additional management support is needed unless otherwise documented below in the visit note. 

## 2014-10-03 NOTE — Patient Instructions (Addendum)
We will add [place] to blood work next week and afterwards for a visit. Let's change ativan to clonazepam - longer acting benzodiazepine. Stop ativan. Start klonopin (clonazepam) once daily, may take second pill at night if needed

## 2014-10-03 NOTE — Assessment & Plan Note (Addendum)
Upreg negative. ?perimenopausal - and discussed this could be normal process. Check FSH next lab draw. If normal, consider provera or estrogen course. Pt agrees with plan.

## 2014-10-03 NOTE — Progress Notes (Signed)
BP 160/80 mmHg  Pulse 80  Temp(Src) 98.3 F (36.8 C) (Oral)  Wt 269 lb (122.018 kg)  LMP 07/24/2014 Body mass index is 46.15 kg/(m^2).   CC: 17mo f/u Subjective:    Patient ID: Lisa Curry, female    DOB: [birth year], 44 y.o.   MRN: [id number]  HPI: Lisa Curry is a 44 y.o. female presenting on 10/03/2014 for Follow-up   LMP 07/24/2014. H/o mildly irregular cycles (31-42 day cycles). No period since then. No intermenstral spotting. No fmhx early menopause. No hot flashes noted. Denies pregnancy chance. No abd or pelvic pain  Depression - over last month struggling with mood. More irritable, impatient, easily cries, forgetful, feeling anxiety attacks associated with dyspnea and palpitations. Unable to multitask. Has been compliant with effexor 225mg  XL and wellbutrin 75 mg bid. Also on lorazepam 0.5mg  bid prn. Has been taking about regularly. Sleeping "like a rock".   DM - complaint with amaryl and tradjenta.   Lab Results  Component Value Date   HGBA1C 7.2* 07/14/2014     Relevant past medical, surgical, family and social history reviewed and updated as indicated. Interim medical history since our last visit reviewed. Allergies and medications reviewed and updated. Current Outpatient Prescriptions on File Prior to Visit  Medication Sig  . buPROPion (WELLBUTRIN) 75 MG tablet TAKE 1 TABLET (75 MG TOTAL) BY MOUTH 2 (TWO) TIMES DAILY.  Marland Kitchen dexlansoprazole (DEXILANT) 60 MG capsule Take 1 capsule (60 mg total) by mouth daily.  Marland Kitchen glimepiride (AMARYL) 4 MG tablet Take 1 tablet (4 mg total) by mouth daily with breakfast.  . linagliptin (TRADJENTA) 5 MG TABS tablet Take 1 tablet (5 mg total) by mouth daily.  Marland Kitchen venlafaxine XR (EFFEXOR XR) 75 MG 24 hr capsule Take 1 capsule (75 mg total) by mouth daily with breakfast.  . venlafaxine XR (EFFEXOR-XR) 150 MG 24 hr capsule Take 150 mg by mouth daily with breakfast.   No current facility-administered medications on file prior to visit.    Review  of Systems Per HPI unless specifically indicated above     Objective:    BP 160/80 mmHg  Pulse 80  Temp(Src) 98.3 F (36.8 C) (Oral)  Wt 269 lb (122.018 kg)  LMP 07/24/2014  Wt Readings from Last 3 Encounters:  10/03/14 269 lb (122.018 kg)  07/14/14 267 lb 8 oz (121.337 kg)  06/21/14 270 lb (122.471 kg)    Physical Exam  Constitutional: She appears well-developed and well-nourished. No distress.  HENT:  Mouth/Throat: Oropharynx is clear and moist. No oropharyngeal exudate.  Eyes: Conjunctivae and EOM are normal. Pupils are equal, round, and reactive to light. No scleral icterus.  Neck: No thyromegaly present.  Cardiovascular: Regular rhythm, normal heart sounds and intact distal pulses.  Tachycardia present.   No murmur heard. Pulmonary/Chest: Effort normal and breath sounds normal. No respiratory distress. She has no wheezes. She has no rales.  Musculoskeletal: She exhibits no edema.  Lymphadenopathy:    She has no cervical adenopathy.  Psychiatric: Her mood appears anxious.  Tearful with discussion of stressors  Nursing note and vitals reviewed.      Assessment & Plan:   Problem List Items Addressed This Visit    Diabetes mellitus type 2, uncontrolled   Relevant Orders   Hemoglobin X8B   Basic metabolic panel   Depression with anxiety - Primary    Deteriorated - with what is described as anxiety attacks. Has been regularly taking ativan. Given worsening anxiety described will change benzo  to clonazepam 0.5mg  once daily with option for 2nd pill daily.  Continue effexor 225mg  XL daily and wellbutrin 75mg  bid. Continue to see Dr Gilda Crease in 2 wks for f/u. Pt agrees with plan.      Amenorrhea    Upreg negative. ?perimenopausal - and discussed this could be normal process. Check FSH next lab draw. If normal, consider provera or estrogen course. Pt agrees with plan.      Relevant Orders   POCT urine pregnancy (Completed)   Follicle Stimulating Hormone         Follow up plan: Return in about 2 weeks (around 10/17/2014), or as needed, for follow up visit.

## 2014-10-03 NOTE — Assessment & Plan Note (Signed)
Deteriorated - with what is described as anxiety attacks. Has been regularly taking ativan. Given worsening anxiety described will change benzo to clonazepam 0.5mg  once daily with option for 2nd pill daily.  Continue effexor 225mg  XL daily and wellbutrin 75mg  bid. Continue to see Dr Gilda Crease in 2 wks for f/u. Pt agrees with plan.

## 2014-10-09 ENCOUNTER — Ambulatory Visit: Payer: BC Managed Care – PPO | Admitting: Family Medicine

## 2014-10-09 ENCOUNTER — Other Ambulatory Visit (INDEPENDENT_AMBULATORY_CARE_PROVIDER_SITE_OTHER): Payer: BLUE CROSS/BLUE SHIELD

## 2014-10-09 ENCOUNTER — Ambulatory Visit (INDEPENDENT_AMBULATORY_CARE_PROVIDER_SITE_OTHER): Payer: BLUE CROSS/BLUE SHIELD | Admitting: Psychology

## 2014-10-09 DIAGNOSIS — IMO0002 Reserved for concepts with insufficient information to code with codable children: Secondary | ICD-10-CM

## 2014-10-09 DIAGNOSIS — E1165 Type 2 diabetes mellitus with hyperglycemia: Secondary | ICD-10-CM

## 2014-10-09 DIAGNOSIS — N912 Amenorrhea, unspecified: Secondary | ICD-10-CM

## 2014-10-09 DIAGNOSIS — F332 Major depressive disorder, recurrent severe without psychotic features: Secondary | ICD-10-CM

## 2014-10-09 LAB — BASIC METABOLIC PANEL
BUN: 10 mg/dL (ref 6–23)
CO2: 28 mEq/L (ref 19–32)
Calcium: 9.2 mg/dL (ref 8.4–10.5)
Chloride: 101 mEq/L (ref 96–112)
Creatinine, Ser: 0.81 mg/dL (ref 0.40–1.20)
GFR: 81.46 mL/min (ref >60.00)
Glucose, Bld: 188 mg/dL — ABNORMAL HIGH (ref 70–99)
Potassium: 3.6 mEq/L (ref 3.5–5.1)
Sodium: 137 mEq/L (ref 135–145)

## 2014-10-09 LAB — HEMOGLOBIN A1C: Hgb A1c MFr Bld: 7.9 % — ABNORMAL HIGH (ref 4.6–6.5)

## 2014-10-09 LAB — FOLLICLE STIMULATING HORMONE: FSH: 6.4 m[IU]/mL

## 2014-10-16 ENCOUNTER — Encounter: Payer: Self-pay | Admitting: *Deleted

## 2014-10-16 ENCOUNTER — Encounter: Payer: Self-pay | Admitting: Family Medicine

## 2014-10-16 ENCOUNTER — Ambulatory Visit (INDEPENDENT_AMBULATORY_CARE_PROVIDER_SITE_OTHER): Payer: BLUE CROSS/BLUE SHIELD | Admitting: Family Medicine

## 2014-10-16 VITALS — BP 138/76 | HR 88 | Temp 98.2°F | Wt 268.5 lb

## 2014-10-16 DIAGNOSIS — F418 Other specified anxiety disorders: Secondary | ICD-10-CM

## 2014-10-16 DIAGNOSIS — M25521 Pain in right elbow: Secondary | ICD-10-CM | POA: Insufficient documentation

## 2014-10-16 DIAGNOSIS — IMO0002 Reserved for concepts with insufficient information to code with codable children: Secondary | ICD-10-CM

## 2014-10-16 DIAGNOSIS — E1165 Type 2 diabetes mellitus with hyperglycemia: Secondary | ICD-10-CM

## 2014-10-16 DIAGNOSIS — N912 Amenorrhea, unspecified: Secondary | ICD-10-CM

## 2014-10-16 MED ORDER — CLONAZEPAM 0.5 MG PO TABS: ORAL_TABLET | ORAL | Status: DC

## 2014-10-16 MED ORDER — GLIMEPIRIDE 4 MG PO TABS: 8.0000 mg | ORAL_TABLET | Freq: Every day | ORAL | Status: DC

## 2014-10-16 NOTE — Assessment & Plan Note (Signed)
Chronic, stable. Foot exam today. Discussed elevated A1c Increase amaryl to 8mg  daily, and discussed healthy diet'lifestyle changes to affect better glycemic control and weight los.

## 2014-10-16 NOTE — Assessment & Plan Note (Signed)
[place] normal. Had period last week. Will return for CPE next visit, reassess period frequency then.

## 2014-10-16 NOTE — Assessment & Plan Note (Addendum)
Unsure if clonazepam has helped, but she feels better than last visit. Continue current regimen of effexor XR 225mg  , wellbutrin 75mg  bid, and klonopin 0.5mg  daily with extra dose prn. UDS and controlled substance agreement discussed and filled out today.

## 2014-10-16 NOTE — Assessment & Plan Note (Signed)
Anticipate tennis elbow on right Discussed tennis elbow strap, avoiding repetitive motions and NSAID use.

## 2014-10-16 NOTE — Progress Notes (Signed)
Pre visit review using our clinic review tool, if applicable. No additional management support is needed unless otherwise documented below in the visit note. 

## 2014-10-16 NOTE — Patient Instructions (Addendum)
Over next 3 months let's focus on diet and exercise. Decrease simple carbs and added sugars and sweetened beverages in diet, incorporate walking into routine - start 35min three times weekly then increase to 50 min three times weekly or 56min 5 times weekly Increase amaryl to 8mg  daily. Return in 3 months for physical, sooner if needed Continue klonopin 0.5mg  with extra dose as needed. Watch menstrual cycle over next few months. UDS today.

## 2014-10-16 NOTE — Assessment & Plan Note (Signed)
Reviewed healthy diet and lifestyle changes to affect sustainable weight loss Reassess in 3 months. Body mass index is 46.07 kg/(m^2).

## 2014-10-16 NOTE — Progress Notes (Signed)
BP 138/76 mmHg  Pulse 88  Temp(Src) 98.2 F (36.8 C) (Oral)  Wt 268 lb 8 oz (121.791 kg)  LMP 10/09/2014   CC: 2 wk f/u visit  Subjective:    Patient ID: Lisa Curry, female    DOB: [birth year], 44 y.o.   MRN: [id number]  HPI: KALIMAH CAPURRO is a 44 y.o. female presenting on 10/16/2014 for Follow-up   See prior note for details. Seen here last month for f/u depression and 2 months of amenorrhea. FSH was normal pointing against perimenopause, A1c was elevated to 7.9%.   Anxiety/depression - last visit we continued wellbutrin and effexor as well as changed ativan to longer acting clonazepam 0.5mg  once daily to twice daily as needed.   LMP 10/09/2014. Felt better after period.   DM - regularly does check sugars fasting 120-160. Compliant with antihyperglycemic regimen which includes: tradjenta 5mg  and amaryl 4mg  daily. Denies low sugars or hypoglycemic symptoms. Is not regular with diabetic diet. Denies paresthesias. Last diabetic eye exam 12/2013.  Pneumovax: 06/2014.  Prevnar: not due.  framingham risk <1%.  Lab Results  Component Value Date   HGBA1C 7.9* 10/09/2014   Diabetic Foot Exam - Simple   Simple Foot Form  Diabetic Foot exam was performed with the following findings:  Yes 10/16/2014 12:40 PM  Visual Inspection  No deformities, no ulcerations, no other skin breakdown bilaterally:  Yes  Sensation Testing  Intact to touch and monofilament testing bilaterally:  Yes  Pulse Check  Posterior Tibialis and Dorsalis pulse intact bilaterally:  Yes  Comments     Also with R arm pain - worse over last few months. Describes lateral elbow pain worse with certain movements.  Relevant past medical, surgical, family and social history reviewed and updated as indicated. Interim medical history since our last visit reviewed. Allergies and medications reviewed and updated. Current Outpatient Prescriptions on File Prior to Visit  Medication Sig  . buPROPion (WELLBUTRIN) 75 MG tablet TAKE 1  TABLET (75 MG TOTAL) BY MOUTH 2 (TWO) TIMES DAILY.  Marland Kitchen dexlansoprazole (DEXILANT) 60 MG capsule Take 1 capsule (60 mg total) by mouth daily.  Marland Kitchen linagliptin (TRADJENTA) 5 MG TABS tablet Take 1 tablet (5 mg total) by mouth daily.  Marland Kitchen venlafaxine XR (EFFEXOR XR) 75 MG 24 hr capsule Take 1 capsule (75 mg total) by mouth daily with breakfast.  . venlafaxine XR (EFFEXOR-XR) 150 MG 24 hr capsule Take 150 mg by mouth daily with breakfast.   No current facility-administered medications on file prior to visit.    Review of Systems Per HPI unless specifically indicated above     Objective:    BP 138/76 mmHg  Pulse 88  Temp(Src) 98.2 F (36.8 C) (Oral)  Wt 268 lb 8 oz (121.791 kg)  LMP 10/09/2014  Wt Readings from Last 3 Encounters:  10/16/14 268 lb 8 oz (121.791 kg)  10/03/14 269 lb (122.018 kg)  07/14/14 267 lb 8 oz (121.337 kg)   Body mass index is 46.07 kg/(m^2). Physical Exam  Constitutional: She appears well-developed and well-nourished. No distress.  HENT:  Head: Normocephalic and atraumatic.  Right Ear: External ear normal.  Left Ear: External ear normal.  Nose: Nose normal.  Mouth/Throat: Oropharynx is clear and moist. No oropharyngeal exudate.  Eyes: Conjunctivae and EOM are normal. Pupils are equal, round, and reactive to light. No scleral icterus.  Neck: Normal range of motion. Neck supple.  Cardiovascular: Normal rate, regular rhythm, normal heart sounds and intact distal pulses.  No murmur heard. Pulmonary/Chest: Effort normal and breath sounds normal. No respiratory distress. She has no wheezes. She has no rales.  Musculoskeletal: She exhibits no edema.  See HPI for foot exam if done Tender at lateral epicondyle on right  Lymphadenopathy:    She has no cervical adenopathy.  Skin: Skin is warm and dry. No rash noted.  Psychiatric: She has a normal mood and affect.  Nursing note and vitals reviewed.  Results for orders placed or performed in visit on 62/56/38    Follicle Stimulating Hormone  Result Value Ref Range   FSH 6.4 mIU/ML  Hemoglobin A1c  Result Value Ref Range   Hgb A1c MFr Bld 7.9 (H) 4.6 - 6.5 %  Basic metabolic panel  Result Value Ref Range   Sodium 137 135 - 145 mEq/L   Potassium 3.6 3.5 - 5.1 mEq/L   Chloride 101 96 - 112 mEq/L   CO2 28 19 - 32 mEq/L   Glucose, Bld 188 (H) 70 - 99 mg/dL   BUN 10 6 - 23 mg/dL   Creatinine, Ser 0.81 0.40 - 1.20 mg/dL   Calcium 9.2 8.4 - 10.5 mg/dL   GFR 81.46 >60.00 mL/min      Assessment & Plan:   Problem List Items Addressed This Visit    Severe obesity (BMI >= 40)    Reviewed healthy diet and lifestyle changes to affect sustainable weight loss Reassess in 3 months. Body mass index is 46.07 kg/(m^2).       Relevant Medications   glimepiride (AMARYL) tablet   Right elbow pain    Anticipate tennis elbow on right Discussed tennis elbow strap, avoiding repetitive motions and NSAID use.      Diabetes mellitus type 2, uncontrolled - Primary    Chronic, stable. Foot exam today. Discussed elevated A1c Increase amaryl to 8mg  daily, and discussed healthy diet'lifestyle changes to affect better glycemic control and weight los.      Relevant Medications   glimepiride (AMARYL) tablet   Depression with anxiety    Unsure if clonazepam has helped, but she feels better than last visit. Continue current regimen of effexor XR 225mg  , wellbutrin 75mg  bid, and klonopin 0.5mg  daily with extra dose prn. UDS and controlled substance agreement discussed and filled out today.      Amenorrhea    FSH normal. Had period last week. Will return for CPE next visit, reassess period frequency then.          Follow up plan: Return in about 3 months (around 01/15/2015), or as needed, for annual exam, prior fasting for blood work.

## 2014-10-23 ENCOUNTER — Ambulatory Visit (INDEPENDENT_AMBULATORY_CARE_PROVIDER_SITE_OTHER): Payer: BLUE CROSS/BLUE SHIELD | Admitting: Psychology

## 2014-10-23 DIAGNOSIS — F332 Major depressive disorder, recurrent severe without psychotic features: Secondary | ICD-10-CM

## 2014-10-29 ENCOUNTER — Ambulatory Visit: Payer: BLUE CROSS/BLUE SHIELD | Admitting: Psychology

## 2014-10-29 ENCOUNTER — Ambulatory Visit (INDEPENDENT_AMBULATORY_CARE_PROVIDER_SITE_OTHER): Payer: BLUE CROSS/BLUE SHIELD | Admitting: Psychology

## 2014-10-29 DIAGNOSIS — F332 Major depressive disorder, recurrent severe without psychotic features: Secondary | ICD-10-CM

## 2014-10-31 ENCOUNTER — Encounter: Payer: Self-pay | Admitting: Family Medicine

## 2014-11-05 ENCOUNTER — Ambulatory Visit (INDEPENDENT_AMBULATORY_CARE_PROVIDER_SITE_OTHER): Payer: BLUE CROSS/BLUE SHIELD | Admitting: Psychology

## 2014-11-05 DIAGNOSIS — F332 Major depressive disorder, recurrent severe without psychotic features: Secondary | ICD-10-CM

## 2014-11-12 ENCOUNTER — Ambulatory Visit (INDEPENDENT_AMBULATORY_CARE_PROVIDER_SITE_OTHER): Payer: BLUE CROSS/BLUE SHIELD | Admitting: Psychology

## 2014-11-12 DIAGNOSIS — F332 Major depressive disorder, recurrent severe without psychotic features: Secondary | ICD-10-CM

## 2014-11-19 ENCOUNTER — Ambulatory Visit (INDEPENDENT_AMBULATORY_CARE_PROVIDER_SITE_OTHER): Payer: BLUE CROSS/BLUE SHIELD | Admitting: Psychology

## 2014-11-19 DIAGNOSIS — F332 Major depressive disorder, recurrent severe without psychotic features: Secondary | ICD-10-CM | POA: Diagnosis not present

## 2014-11-26 ENCOUNTER — Ambulatory Visit: Payer: BLUE CROSS/BLUE SHIELD | Admitting: Psychology

## 2014-12-03 ENCOUNTER — Ambulatory Visit (INDEPENDENT_AMBULATORY_CARE_PROVIDER_SITE_OTHER): Payer: BLUE CROSS/BLUE SHIELD | Admitting: Psychology

## 2014-12-03 DIAGNOSIS — F332 Major depressive disorder, recurrent severe without psychotic features: Secondary | ICD-10-CM | POA: Diagnosis not present

## 2014-12-06 ENCOUNTER — Other Ambulatory Visit: Payer: Self-pay | Admitting: Family Medicine

## 2014-12-15 ENCOUNTER — Other Ambulatory Visit: Payer: Self-pay | Admitting: Family Medicine

## 2014-12-15 NOTE — Telephone Encounter (Signed)
Pt left v/m requesting refill clonazepam to [facility]; pt is out of med.Please advise. Pt last seen 10/16/14.

## 2014-12-15 NOTE — Telephone Encounter (Signed)
Rx called in as directed.   

## 2014-12-15 NOTE — Telephone Encounter (Signed)
plz phone in. 

## 2014-12-16 ENCOUNTER — Ambulatory Visit (INDEPENDENT_AMBULATORY_CARE_PROVIDER_SITE_OTHER): Payer: BLUE CROSS/BLUE SHIELD | Admitting: Psychology

## 2014-12-16 DIAGNOSIS — F332 Major depressive disorder, recurrent severe without psychotic features: Secondary | ICD-10-CM

## 2014-12-24 ENCOUNTER — Ambulatory Visit (INDEPENDENT_AMBULATORY_CARE_PROVIDER_SITE_OTHER): Payer: BLUE CROSS/BLUE SHIELD | Admitting: Psychology

## 2014-12-24 DIAGNOSIS — F332 Major depressive disorder, recurrent severe without psychotic features: Secondary | ICD-10-CM | POA: Diagnosis not present

## 2014-12-30 ENCOUNTER — Ambulatory Visit (INDEPENDENT_AMBULATORY_CARE_PROVIDER_SITE_OTHER): Payer: BLUE CROSS/BLUE SHIELD | Admitting: Psychology

## 2014-12-30 DIAGNOSIS — F332 Major depressive disorder, recurrent severe without psychotic features: Secondary | ICD-10-CM | POA: Diagnosis not present

## 2015-01-05 ENCOUNTER — Other Ambulatory Visit: Payer: Self-pay | Admitting: Family Medicine

## 2015-01-05 NOTE — Telephone Encounter (Signed)
Ok to refill 

## 2015-01-06 NOTE — Telephone Encounter (Signed)
plz phone in. 

## 2015-01-06 NOTE — Telephone Encounter (Signed)
Rx called in as directed.   

## 2015-01-07 ENCOUNTER — Ambulatory Visit (INDEPENDENT_AMBULATORY_CARE_PROVIDER_SITE_OTHER): Payer: BLUE CROSS/BLUE SHIELD | Admitting: Psychology

## 2015-01-07 DIAGNOSIS — F4322 Adjustment disorder with anxiety: Secondary | ICD-10-CM | POA: Diagnosis not present

## 2015-01-14 ENCOUNTER — Ambulatory Visit (INDEPENDENT_AMBULATORY_CARE_PROVIDER_SITE_OTHER): Payer: BLUE CROSS/BLUE SHIELD | Admitting: Psychology

## 2015-01-14 DIAGNOSIS — F332 Major depressive disorder, recurrent severe without psychotic features: Secondary | ICD-10-CM

## 2015-01-20 ENCOUNTER — Other Ambulatory Visit (HOSPITAL_COMMUNITY)
Admission: RE | Admit: 2015-01-20 | Discharge: 2015-01-20 | Disposition: A | Discharge status: Home / Self Care | Payer: BLUE CROSS/BLUE SHIELD | Source: Ambulatory Visit | Attending: Family Medicine | Admitting: Family Medicine

## 2015-01-20 ENCOUNTER — Ambulatory Visit (INDEPENDENT_AMBULATORY_CARE_PROVIDER_SITE_OTHER): Payer: BLUE CROSS/BLUE SHIELD | Admitting: Family Medicine

## 2015-01-20 ENCOUNTER — Encounter: Payer: Self-pay | Admitting: Family Medicine

## 2015-01-20 VITALS — BP 112/74 | HR 80 | Temp 97.8°F | Ht 64.0 in | Wt 274.0 lb

## 2015-01-20 DIAGNOSIS — Z124 Encounter for screening for malignant neoplasm of cervix: Secondary | ICD-10-CM

## 2015-01-20 DIAGNOSIS — F418 Other specified anxiety disorders: Secondary | ICD-10-CM

## 2015-01-20 DIAGNOSIS — E1165 Type 2 diabetes mellitus with hyperglycemia: Secondary | ICD-10-CM

## 2015-01-20 DIAGNOSIS — Z0000 Encounter for general adult medical examination without abnormal findings: Secondary | ICD-10-CM | POA: Insufficient documentation

## 2015-01-20 DIAGNOSIS — K219 Gastro-esophageal reflux disease without esophagitis: Secondary | ICD-10-CM

## 2015-01-20 DIAGNOSIS — Z01419 Encounter for gynecological examination (general) (routine) without abnormal findings: Secondary | ICD-10-CM | POA: Diagnosis not present

## 2015-01-20 DIAGNOSIS — Z0001 Encounter for general adult medical examination with abnormal findings: Secondary | ICD-10-CM | POA: Insufficient documentation

## 2015-01-20 DIAGNOSIS — IMO0002 Reserved for concepts with insufficient information to code with codable children: Secondary | ICD-10-CM

## 2015-01-20 LAB — HM PAP SMEAR: HM Pap smear: NORMAL

## 2015-01-20 NOTE — Addendum Note (Signed)
Addended by: Royann Shivers A on: 01/20/2015 04:12 PM   Modules accepted: Orders

## 2015-01-20 NOTE — Assessment & Plan Note (Signed)
Will request records of latest EGD to see when next due (in h/o barrett's esophagus).

## 2015-01-20 NOTE — Assessment & Plan Note (Signed)
Check A1c today.

## 2015-01-20 NOTE — Assessment & Plan Note (Signed)
Reviewed current med regimen, support provided.

## 2015-01-20 NOTE — Assessment & Plan Note (Signed)
Discussed healthy diet and lifestyle changes to affect sustainable weight loss. Body mass index is 47.01 kg/(m^2).

## 2015-01-20 NOTE — Assessment & Plan Note (Signed)
Preventative protocols reviewed and updated unless pt declined. Discussed healthy diet and lifestyle.  

## 2015-01-20 NOTE — Progress Notes (Signed)
Pre visit review using our clinic review tool, if applicable. No additional management support is needed unless otherwise documented below in the visit note. 

## 2015-01-20 NOTE — Patient Instructions (Addendum)
Sign release for records of EGD at [facility] to see you today, call us with quesitons. Return as needed or in 4-6 months for follow up labwork today.  Health Maintenance Adopting a healthy lifestyle and getting preventive care can go a long way to promote health and wellness. Talk with your health care provider about what schedule of regular examinations is right for you. This is a good chance for you to check in with your provider about disease prevention and staying healthy. In between checkups, there are plenty of things you can do on your own. Experts have done a lot of research about which lifestyle changes and preventive measures are most likely to keep you healthy. Ask your health care provider for more information. WEIGHT AND DIET  Eat a healthy diet  Be sure to include plenty of vegetables, fruits, low-fat dairy products, and lean protein.  Do not eat a lot of foods high in solid fats, added sugars, or salt.  Get regular exercise. This is one of the most important things you can do for your health.  Most adults should exercise for at least 150 minutes each week. The exercise should increase your heart rate and make you sweat (moderate-intensity exercise).  Most adults should also do strengthening exercises at least twice a week. This is in addition to the moderate-intensity exercise.  Maintain a healthy weight  Body mass index (BMI) is a measurement that can be used to identify possible weight problems. It estimates body fat based on height and weight. Your health care provider can help determine your BMI and help you achieve or maintain a healthy weight.  For females 3 years of age and older:   A BMI below 18.5 is considered underweight.  A BMI of 18.5 to 24.9 is normal.  A BMI of 25 to 29.9 is considered overweight.  A BMI of 30 and above is considered obese.  Watch levels of cholesterol and blood lipids  You should start having your blood tested for  lipids and cholesterol at 45 years of age, then have this test every 5 years.  You may need to have your cholesterol levels checked more often if:  Your lipid or cholesterol levels are high.  You are older than 45 years of age.  You are at high risk for heart disease.  CANCER SCREENING   Lung Cancer  Lung cancer screening is recommended for adults 88-78 years old who are at high risk for lung cancer because of a history of smoking.  A yearly low-dose CT scan of the lungs is recommended for people who:  Currently smoke.  Have quit within the past 15 years.  Have at least a 30-pack-year history of smoking. A pack year is smoking an average of one pack of cigarettes a day for 1 year.  Yearly screening should continue until it has been 15 years since you quit.  Yearly screening should stop if you develop a health problem that would prevent you from having lung cancer treatment.  Breast Cancer  Practice breast self-awareness. This means understanding how your breasts normally appear and feel.  It also means doing regular breast self-exams. Let your health care provider know about any changes, no matter how small.  If you are in your 20s or 30s, you should have a clinical breast exam (CBE) by a health care provider every 1-3 years as part of a regular health exam.  If you are 32 or older, have a CBE every year. Also  consider having a breast X-ray (mammogram) every year.  If you have a family history of breast cancer, talk to your health care provider about genetic screening.  If you are at high risk for breast cancer, talk to your health care provider about having an MRI and a mammogram every year.  Breast cancer gene (BRCA) assessment is recommended for women who have family members with BRCA-related cancers. BRCA-related cancers include:  Breast.  Ovarian.  Tubal.  Peritoneal cancers.  Results of the assessment will determine the need for genetic counseling and BRCA1  and BRCA2 testing. Cervical Cancer Routine pelvic examinations to screen for cervical cancer are no longer recommended for nonpregnant women who are considered low risk for cancer of the pelvic organs (ovaries, uterus, and vagina) and who do not have symptoms. A pelvic examination may be necessary if you have symptoms including those associated with pelvic infections. Ask your health care provider if a screening pelvic exam is right for you.   The Pap test is the screening test for cervical cancer for women who are considered at risk.  If you had a hysterectomy for a problem that was not cancer or a condition that could lead to cancer, then you no longer need Pap tests.  If you are older than 65 years, and you have had normal Pap tests for the past 10 years, you no longer need to have Pap tests.  If you have had past treatment for cervical cancer or a condition that could lead to cancer, you need Pap tests and screening for cancer for at least 20 years after your treatment.  If you no longer get a Pap test, assess your risk factors if they change (such as having a new sexual partner). This can affect whether you should start being screened again.  Some women have medical problems that increase their chance of getting cervical cancer. If this is the case for you, your health care provider may recommend more frequent screening and Pap tests.  The human papillomavirus (HPV) test is another test that may be used for cervical cancer screening. The HPV test looks for the virus that can cause cell changes in the cervix. The cells collected during the Pap test can be tested for HPV.  The HPV test can be used to screen women 30 years of age and older. Getting tested for HPV can extend the interval between normal Pap tests from three to five years.  An HPV test also should be used to screen women of any age who have unclear Pap test results.  After 45 years of age, women should have HPV testing as often  as Pap tests.  Colorectal Cancer  This type of cancer can be detected and often prevented.  Routine colorectal cancer screening usually begins at 45 years of age and continues through 45 years of age.  Your health care provider may recommend screening at an earlier age if you have risk factors for colon cancer.  Your health care provider may also recommend using home test kits to check for hidden blood in the stool.  A small camera at the end of a tube can be used to examine your colon directly (sigmoidoscopy or colonoscopy). This is done to check for the earliest forms of colorectal cancer.  Routine screening usually begins at age 50.  Direct examination of the colon should be repeated every 5-10 years through 45 years of age. However, you may need to be screened more often if early forms of   precancerous polyps or small growths are found. Skin Cancer  Check your skin from head to toe regularly.  Tell your health care provider about any new moles or changes in moles, especially if there is a change in a mole's shape or color.  Also tell your health care provider if you have a mole that is larger than the size of a pencil eraser.  Always use sunscreen. Apply sunscreen liberally and repeatedly throughout the day.  Protect yourself by wearing long sleeves, pants, a wide-brimmed hat, and sunglasses whenever you are outside. HEART DISEASE, DIABETES, AND HIGH BLOOD PRESSURE   Have your blood pressure checked at least every 1-2 years. High blood pressure causes heart disease and increases the risk of stroke.  If you are between 55 years and 79 years old, ask your health care provider if you should take aspirin to prevent strokes.  Have regular diabetes screenings. This involves taking a blood sample to check your fasting blood sugar level.  If you are at a normal weight and have a low risk for diabetes, have this test once every three years after 45 years of age.  If you are overweight  and have a high risk for diabetes, consider being tested at a younger age or more often. PREVENTING INFECTION  Hepatitis B  If you have a higher risk for hepatitis B, you should be screened for this virus. You are considered at high risk for hepatitis B if:  You were born in a country where hepatitis B is common. Ask your health care provider which countries are considered high risk.  Your parents were born in a high-risk country, and you have not been immunized against hepatitis B (hepatitis B vaccine).  You have HIV or AIDS.  You use needles to inject street drugs.  You live with someone who has hepatitis B.  You have had sex with someone who has hepatitis B.  You get hemodialysis treatment.  You take certain medicines for conditions, including cancer, organ transplantation, and autoimmune conditions. Hepatitis C  Blood testing is recommended for:  Everyone born from 1945 through 1965.  Anyone with known risk factors for hepatitis C. Sexually transmitted infections (STIs)  You should be screened for sexually transmitted infections (STIs) including gonorrhea and chlamydia if:  You are sexually active and are younger than 45 years of age.  You are older than 45 years of age and your health care provider tells you that you are at risk for this type of infection.  Your sexual activity has changed since you were last screened and you are at an increased risk for chlamydia or gonorrhea. Ask your health care provider if you are at risk.  If you do not have HIV, but are at risk, it may be recommended that you take a prescription medicine daily to prevent HIV infection. This is called pre-exposure prophylaxis (PrEP). You are considered at risk if:  You are sexually active and do not regularly use condoms or know the HIV status of your partner(s).  You take drugs by injection.  You are sexually active with a partner who has HIV. Talk with your health care provider about whether  you are at high risk of being infected with HIV. If you choose to begin PrEP, you should first be tested for HIV. You should then be tested every 3 months for as long as you are taking PrEP.  PREGNANCY   If you are premenopausal and you may become pregnant, ask your health care   provider about preconception counseling.  If you may become pregnant, take 400 to 800 micrograms (mcg) of folic acid every day.  If you want to prevent pregnancy, talk to your health care provider about birth control (contraception). OSTEOPOROSIS AND MENOPAUSE   Osteoporosis is a disease in which the bones lose minerals and strength with aging. This can result in serious bone fractures. Your risk for osteoporosis can be identified using a bone density scan.  If you are 65 years of age or older, or if you are at risk for osteoporosis and fractures, ask your health care provider if you should be screened.  Ask your health care provider whether you should take a calcium or vitamin D supplement to lower your risk for osteoporosis.  Menopause may have certain physical symptoms and risks.  Hormone replacement therapy may reduce some of these symptoms and risks. Talk to your health care provider about whether hormone replacement therapy is right for you.  HOME CARE INSTRUCTIONS   Schedule regular health, dental, and eye exams.  Stay current with your immunizations.   Do not use any tobacco products including cigarettes, chewing tobacco, or electronic cigarettes.  If you are pregnant, do not drink alcohol.  If you are breastfeeding, limit how much and how often you drink alcohol.  Limit alcohol intake to no more than 1 drink per day for nonpregnant women. One drink equals 12 ounces of beer, 5 ounces of wine, or 1 ounces of hard liquor.  Do not use street drugs.  Do not share needles.  Ask your health care provider for help if you need support or information about quitting drugs.  Tell your health care  provider if you often feel depressed.  Tell your health care provider if you have ever been abused or do not feel safe at home. Document Released: 03/21/2011 Document Revised: 01/20/2014 Document Reviewed: 08/07/2013 Exit[employer] Patient Information 2015 [employer]. This information is not intended to replace advice given to you by your health care provider. Make sure you discuss any questions you have with your health care provider.  

## 2015-01-20 NOTE — Progress Notes (Signed)
BP 112/74 mmHg  Pulse 80  Temp(Src) 97.8 F (36.6 C) (Oral)  Ht 5\' 4"  (1.626 m)  Wt 274 lb (124.286 kg)  BMI 47.01 kg/m2  LMP 01/12/2015   CC: CPE  Subjective:    Patient ID: Lisa Curry, female    DOB: 1971/01/08, 44 y.o.   MRN: [id number]  HPI: Lisa Curry is a 44 y.o. female presenting on 01/20/2015 for Annual Exam  Anxiety /depression - doing well with effexor 225mg  daily in am + wellbutrin 75mg  bid as well as klonopin 0.75mg  daily. Continues seeing Dr Rexene Edison.   Preventative: LMP:11/07/2012 Pap Smear: 2012, 2015 normal (absent transformation zone).  Mammogram: 2015 normal, discussed would like Q2 yrs. Flu: 07/2014 Pneumovax 2015 Tetanus: >10 yrs - declines  Eye Doctor: QO year.  Dentist: biannually  Lives with husband and 2 children, 3 dogs, 2 cats and 2 duck Occ: special ed substitute Pharmacist, hospital. Activity: no regular exercise Diet: good water, fruits/vegetables daily  Relevant past medical, surgical, family and social history reviewed and updated as indicated. Interim medical history since our last visit reviewed. Allergies and medications reviewed and updated. Current Outpatient Prescriptions on File Prior to Visit  Medication Sig  . buPROPion (WELLBUTRIN) 75 MG tablet TAKE 1 TABLET (75 MG TOTAL) BY MOUTH 2 (TWO) TIMES DAILY.  . clonazePAM (KLONOPIN) 0.5 MG tablet TAKE 1 TABLET BY MOUTH DAILY, MAY TAKE AN ADDITIONAL SECOND TABLET IF NEEDED  . dexlansoprazole (DEXILANT) 60 MG capsule Take 1 capsule (60 mg total) by mouth daily.  Marland Kitchen glimepiride (AMARYL) 4 MG tablet Take 2 tablets (8 mg total) by mouth daily with breakfast.  . TRADJENTA 5 MG TABS tablet TAKE 1 TABLET (5 MG TOTAL) BY MOUTH DAILY.  Marland Kitchen venlafaxine XR (EFFEXOR XR) 75 MG 24 hr capsule Take 1 capsule (75 mg total) by mouth daily with breakfast.  . venlafaxine XR (EFFEXOR-XR) 150 MG 24 hr capsule Take 150 mg by mouth daily with breakfast.   No current facility-administered medications on file prior to visit.      Review of Systems  Constitutional: Negative for fever, chills, activity change, appetite change, fatigue and unexpected weight change.  HENT: Negative for hearing loss.   Eyes: Negative for visual disturbance.  Respiratory: Negative for cough, chest tightness, shortness of breath and wheezing.   Cardiovascular: Negative for chest pain, palpitations and leg swelling.  Gastrointestinal: Negative for nausea, vomiting, abdominal pain, diarrhea, constipation, blood in stool and abdominal distention.  Genitourinary: Negative for hematuria and difficulty urinating.  Musculoskeletal: Negative for myalgias, arthralgias and neck pain.  Skin: Negative for rash.  Neurological: Negative for dizziness, seizures, syncope and headaches.  Hematological: Negative for adenopathy. Does not bruise/bleed easily.  Psychiatric/Behavioral: Negative for dysphoric mood. The patient is not nervous/anxious.    Per HPI unless specifically indicated above     Objective:    BP 112/74 mmHg  Pulse 80  Temp(Src) 97.8 F (36.6 C) (Oral)  Ht 5\' 4"  (1.626 m)  Wt 274 lb (124.286 kg)  BMI 47.01 kg/m2  LMP 01/12/2015  Wt Readings from Last 3 Encounters:  01/20/15 274 lb (124.286 kg)  10/16/14 268 lb 8 oz (121.791 kg)  10/03/14 269 lb (122.018 kg)    Physical Exam  Constitutional: She is oriented to person, place, and time. She appears well-developed and well-nourished. No distress.  HENT:  Head: Normocephalic and atraumatic.  Right Ear: Hearing, tympanic membrane, external ear and ear canal normal.  Left Ear: Hearing, tympanic membrane, external ear and ear  canal normal.  Nose: Nose normal.  Mouth/Throat: Uvula is midline, oropharynx is clear and moist and mucous membranes are normal. No oropharyngeal exudate, posterior oropharyngeal edema or posterior oropharyngeal erythema.  Eyes: Conjunctivae and EOM are normal. Pupils are equal, round, and reactive to light. No scleral icterus.  Neck: Normal range of  motion. Neck supple. No thyromegaly present.  Cardiovascular: Normal rate, regular rhythm, normal heart sounds and intact distal pulses.   No murmur heard. Pulses:      Radial pulses are 2+ on the right side, and 2+ on the left side.  Pulmonary/Chest: Effort normal and breath sounds normal. No respiratory distress. She has no wheezes. She has no rales. Right breast exhibits no inverted nipple, no mass, no nipple discharge, no skin change and no tenderness. Left breast exhibits no inverted nipple, no mass, no nipple discharge, no skin change and no tenderness. Breasts are symmetrical.  Abdominal: Soft. Bowel sounds are normal. She exhibits no distension and no mass. There is no tenderness. There is no rebound and no guarding.  Genitourinary: Vagina normal and uterus normal. Pelvic exam was performed with patient supine. There is no rash, tenderness, lesion or injury on the right labia. There is no rash, tenderness, lesion or injury on the left labia. Cervix exhibits no motion tenderness, no discharge and no friability. Right adnexum displays no mass, no tenderness and no fullness. Left adnexum displays no mass, no tenderness and no fullness.  Musculoskeletal: Normal range of motion. She exhibits no edema.  Lymphadenopathy:       Head (right side): No submental, no submandibular, no tonsillar, no preauricular and no posterior auricular adenopathy present.       Head (left side): No submental, no submandibular, no tonsillar, no preauricular and no posterior auricular adenopathy present.    She has no cervical adenopathy.    She has no axillary adenopathy.       Right axillary: No lateral adenopathy present.       Left axillary: No lateral adenopathy present.      Right: No supraclavicular adenopathy present.       Left: No supraclavicular adenopathy present.  Neurological: She is alert and oriented to person, place, and time.  CN grossly intact, station and gait intact  Skin: Skin is warm and dry.  No rash noted.  Psychiatric: She has a normal mood and affect. Her behavior is normal. Judgment and thought content normal.  Nursing note and vitals reviewed.  Results for orders placed or performed in visit on 30/09/23  Follicle Stimulating Hormone  Result Value Ref Range   FSH 6.4 mIU/ML  Hemoglobin A1c  Result Value Ref Range   Hgb A1c MFr Bld 7.9 (H) 4.6 - 6.5 %  Basic metabolic panel  Result Value Ref Range   Sodium 137 135 - 145 mEq/L   Potassium 3.6 3.5 - 5.1 mEq/L   Chloride 101 96 - 112 mEq/L   CO2 28 19 - 32 mEq/L   Glucose, Bld 188 (H) 70 - 99 mg/dL   BUN 10 6 - 23 mg/dL   Creatinine, Ser 0.81 0.40 - 1.20 mg/dL   Calcium 9.2 8.4 - 10.5 mg/dL   GFR 81.46 >60.00 mL/min      Assessment & Plan:   Problem List Items Addressed This Visit    Severe obesity (BMI >= 40)    Discussed healthy diet and lifestyle changes to affect sustainable weight loss. Body mass index is 47.01 kg/(m^2).       Health  maintenance examination - Primary    Preventative protocols reviewed and updated unless pt declined. Discussed healthy diet and lifestyle.       GERD (gastroesophageal reflux disease)    Will request records of latest EGD to see when next due (in h/o barrett's esophagus).      Diabetes mellitus type 2, uncontrolled    Check A1c today.      Relevant Orders   Lipid panel   Hemoglobin B6L   Basic metabolic panel   Depression with anxiety    Reviewed current med regimen, support provided.          Follow up plan: Return in about 6 months (around 07/23/2015), or as needed, for follow up visit.

## 2015-01-21 ENCOUNTER — Ambulatory Visit (INDEPENDENT_AMBULATORY_CARE_PROVIDER_SITE_OTHER): Payer: BLUE CROSS/BLUE SHIELD | Admitting: Psychology

## 2015-01-21 DIAGNOSIS — F332 Major depressive disorder, recurrent severe without psychotic features: Secondary | ICD-10-CM

## 2015-01-21 LAB — BASIC METABOLIC PANEL
BUN: 8 mg/dL (ref 6–23)
CO2: 29 mEq/L (ref 19–32)
Calcium: 9 mg/dL (ref 8.4–10.5)
Chloride: 99 mEq/L (ref 96–112)
Creatinine, Ser: 0.81 mg/dL (ref 0.40–1.20)
GFR: 81.35 mL/min (ref >60.00)
Glucose, Bld: 101 mg/dL — ABNORMAL HIGH (ref 70–99)
Potassium: 3.6 mEq/L (ref 3.5–5.1)
Sodium: 137 mEq/L (ref 135–145)

## 2015-01-21 LAB — LIPID PANEL
Cholesterol: 184 mg/dL (ref 0–200)
HDL: 40.8 mg/dL (ref >39.00)
NonHDL: 143.2
Total CHOL/HDL Ratio: 5
Triglycerides: 226 mg/dL — ABNORMAL HIGH (ref 0.0–149.0)
VLDL: 45.2 mg/dL — ABNORMAL HIGH (ref 0.0–40.0)

## 2015-01-21 LAB — LDL CHOLESTEROL, DIRECT: Direct LDL: 114 mg/dL

## 2015-01-21 LAB — HEMOGLOBIN A1C: Hgb A1c MFr Bld: 7.7 % — ABNORMAL HIGH (ref 4.6–6.5)

## 2015-01-24 ENCOUNTER — Encounter: Payer: Self-pay | Admitting: Family Medicine

## 2015-01-24 DIAGNOSIS — E559 Vitamin D deficiency, unspecified: Secondary | ICD-10-CM | POA: Insufficient documentation

## 2015-01-24 DIAGNOSIS — E785 Hyperlipidemia, unspecified: Secondary | ICD-10-CM | POA: Insufficient documentation

## 2015-01-26 ENCOUNTER — Encounter: Payer: Self-pay | Admitting: *Deleted

## 2015-01-26 LAB — CYTOLOGY - PAP

## 2015-01-27 ENCOUNTER — Encounter: Payer: Self-pay | Admitting: *Deleted

## 2015-01-28 ENCOUNTER — Ambulatory Visit (INDEPENDENT_AMBULATORY_CARE_PROVIDER_SITE_OTHER): Payer: BLUE CROSS/BLUE SHIELD | Admitting: Psychology

## 2015-01-28 ENCOUNTER — Encounter: Payer: Self-pay | Admitting: *Deleted

## 2015-01-28 DIAGNOSIS — F332 Major depressive disorder, recurrent severe without psychotic features: Secondary | ICD-10-CM

## 2015-01-29 ENCOUNTER — Telehealth: Payer: Self-pay

## 2015-01-29 NOTE — Telephone Encounter (Signed)
Patient notified and verbalized understanding. 

## 2015-01-29 NOTE — Telephone Encounter (Signed)
Ok to continue 1mg  (2 tablets) in the morning. Next refill remind [place] to send higher dose if she'd like. Would try for 1 month then reassess.

## 2015-01-29 NOTE — Telephone Encounter (Signed)
Pt left v/m; pt was changed from lorazepam to clonazepam; med list has clonazepam 0.5 mg taking one tab daily and take 2nd tab if needed.  pt feels like she wants to "ring everyone's neck that is in her path." For the last 2 days pt has taken clonazepam 1 mg in AM and pt thinks that has helped some; pt feels calmer and feels more patient. No SI/HI. Pt wants to know how long will it take to feel more calm and should pt continue taking the 1 mg in AM. Pt has never taken the second tab later in the day as originally prescribed. Past week end pt was more anxious and weepy. Pt saw Dr Rexene Edison on 01/28/15. [facility]. Pt request cb.

## 2015-02-04 ENCOUNTER — Ambulatory Visit: Payer: BLUE CROSS/BLUE SHIELD | Admitting: Psychology

## 2015-02-06 ENCOUNTER — Other Ambulatory Visit: Payer: Self-pay | Admitting: Family Medicine

## 2015-02-11 ENCOUNTER — Ambulatory Visit (INDEPENDENT_AMBULATORY_CARE_PROVIDER_SITE_OTHER): Payer: BLUE CROSS/BLUE SHIELD | Admitting: Psychology

## 2015-02-11 DIAGNOSIS — F332 Major depressive disorder, recurrent severe without psychotic features: Secondary | ICD-10-CM

## 2015-02-25 ENCOUNTER — Other Ambulatory Visit: Payer: Self-pay | Admitting: Family Medicine

## 2015-02-25 ENCOUNTER — Ambulatory Visit (INDEPENDENT_AMBULATORY_CARE_PROVIDER_SITE_OTHER): Payer: BLUE CROSS/BLUE SHIELD | Admitting: Psychology

## 2015-02-25 DIAGNOSIS — F332 Major depressive disorder, recurrent severe without psychotic features: Secondary | ICD-10-CM | POA: Diagnosis not present

## 2015-02-25 NOTE — Telephone Encounter (Signed)
plz phone in. 

## 2015-02-25 NOTE — Telephone Encounter (Signed)
Ok to refill 

## 2015-02-26 NOTE — Telephone Encounter (Signed)
Medication phoned to pharmacy.  

## 2015-03-04 ENCOUNTER — Ambulatory Visit (INDEPENDENT_AMBULATORY_CARE_PROVIDER_SITE_OTHER): Payer: BLUE CROSS/BLUE SHIELD | Admitting: Psychology

## 2015-03-04 DIAGNOSIS — F332 Major depressive disorder, recurrent severe without psychotic features: Secondary | ICD-10-CM | POA: Diagnosis not present

## 2015-03-11 ENCOUNTER — Other Ambulatory Visit: Payer: Self-pay | Admitting: Family Medicine

## 2015-03-11 ENCOUNTER — Ambulatory Visit: Payer: BLUE CROSS/BLUE SHIELD | Admitting: Psychology

## 2015-03-18 ENCOUNTER — Ambulatory Visit (INDEPENDENT_AMBULATORY_CARE_PROVIDER_SITE_OTHER): Payer: BLUE CROSS/BLUE SHIELD | Admitting: Psychology

## 2015-03-18 DIAGNOSIS — F332 Major depressive disorder, recurrent severe without psychotic features: Secondary | ICD-10-CM

## 2015-03-25 ENCOUNTER — Ambulatory Visit (INDEPENDENT_AMBULATORY_CARE_PROVIDER_SITE_OTHER): Payer: BLUE CROSS/BLUE SHIELD | Admitting: Psychology

## 2015-03-25 DIAGNOSIS — F332 Major depressive disorder, recurrent severe without psychotic features: Secondary | ICD-10-CM | POA: Diagnosis not present

## 2015-03-27 ENCOUNTER — Other Ambulatory Visit: Payer: Self-pay | Admitting: Family Medicine

## 2015-03-27 NOTE — Telephone Encounter (Signed)
Rx called in as directed.   

## 2015-03-27 NOTE — Telephone Encounter (Signed)
plz phone in. 

## 2015-04-01 ENCOUNTER — Ambulatory Visit: Payer: BLUE CROSS/BLUE SHIELD | Admitting: Psychology

## 2015-04-08 ENCOUNTER — Ambulatory Visit: Payer: BLUE CROSS/BLUE SHIELD | Admitting: Psychology

## 2015-04-22 ENCOUNTER — Ambulatory Visit (INDEPENDENT_AMBULATORY_CARE_PROVIDER_SITE_OTHER): Payer: BLUE CROSS/BLUE SHIELD | Admitting: Psychology

## 2015-04-22 DIAGNOSIS — F332 Major depressive disorder, recurrent severe without psychotic features: Secondary | ICD-10-CM | POA: Diagnosis not present

## 2015-04-28 ENCOUNTER — Other Ambulatory Visit: Payer: Self-pay | Admitting: Family Medicine

## 2015-04-28 NOTE — Telephone Encounter (Signed)
Ok to refill in Dr. Synthia Innocent absence? Last filled 03/27/15 #[id number]

## 2015-04-29 ENCOUNTER — Ambulatory Visit: Payer: BLUE CROSS/BLUE SHIELD | Admitting: Psychology

## 2015-04-29 NOTE — Telephone Encounter (Signed)
Rx called in as directed.   

## 2015-04-29 NOTE — Telephone Encounter (Signed)
Please call in.  Thanks.   

## 2015-05-06 ENCOUNTER — Ambulatory Visit: Payer: BLUE CROSS/BLUE SHIELD | Admitting: Psychology

## 2015-05-20 ENCOUNTER — Ambulatory Visit (INDEPENDENT_AMBULATORY_CARE_PROVIDER_SITE_OTHER): Payer: BLUE CROSS/BLUE SHIELD | Admitting: Psychology

## 2015-05-20 DIAGNOSIS — F332 Major depressive disorder, recurrent severe without psychotic features: Secondary | ICD-10-CM

## 2015-05-27 ENCOUNTER — Other Ambulatory Visit: Payer: Self-pay | Admitting: Family Medicine

## 2015-05-27 ENCOUNTER — Ambulatory Visit: Payer: BLUE CROSS/BLUE SHIELD | Admitting: Psychology

## 2015-05-28 NOTE — Telephone Encounter (Signed)
Ok to refill 

## 2015-05-28 NOTE — Telephone Encounter (Signed)
Rx called in as directed.   

## 2015-05-28 NOTE — Telephone Encounter (Signed)
plz phone in. 

## 2015-06-03 ENCOUNTER — Ambulatory Visit: Payer: BLUE CROSS/BLUE SHIELD | Admitting: Psychology

## 2015-06-10 ENCOUNTER — Ambulatory Visit: Payer: BLUE CROSS/BLUE SHIELD | Admitting: Psychology

## 2015-06-22 ENCOUNTER — Telehealth: Payer: Self-pay | Admitting: Family Medicine

## 2015-06-22 NOTE — Telephone Encounter (Signed)
Doesn't have to be fasting for A1c but if she wants to check trig would fast for 4 hours minimum. Ok to be on cycle.

## 2015-06-22 NOTE — Telephone Encounter (Signed)
Pt called and has follow up on Thursday. Pt would like to know if she needs to be fasting for labs that day and if it matters that she is on her cycle? Please advise.  Ok to leave answers on vm if no answer

## 2015-06-23 NOTE — Telephone Encounter (Signed)
Message left advising patient.  

## 2015-06-24 ENCOUNTER — Ambulatory Visit: Payer: BLUE CROSS/BLUE SHIELD | Admitting: Psychology

## 2015-06-25 ENCOUNTER — Telehealth: Payer: Self-pay | Admitting: Family Medicine

## 2015-06-25 ENCOUNTER — Ambulatory Visit: Payer: Self-pay | Admitting: Family Medicine

## 2015-06-25 NOTE — Telephone Encounter (Signed)
Patient had an appointment for a follow up and lab work appointment for today and cancelled the appointment.  Patient wants to know if she has to have the office visit or can she just come in for lab work?  If patient doesn't answer,please leave a message.

## 2015-06-25 NOTE — Telephone Encounter (Signed)
Message left advising patient that she would need to schedule a follow up according to last AVS.

## 2015-06-26 ENCOUNTER — Other Ambulatory Visit: Payer: Self-pay | Admitting: Family Medicine

## 2015-06-26 NOTE — Telephone Encounter (Signed)
plz phone in. 

## 2015-06-26 NOTE — Telephone Encounter (Signed)
Ok to refill 

## 2015-06-29 NOTE — Telephone Encounter (Signed)
Medication phoned to [place] as instructed.

## 2015-07-08 ENCOUNTER — Other Ambulatory Visit: Payer: Self-pay | Admitting: Family Medicine

## 2015-07-14 ENCOUNTER — Other Ambulatory Visit: Payer: Self-pay | Admitting: Family Medicine

## 2015-07-15 ENCOUNTER — Telehealth: Payer: Self-pay | Admitting: *Deleted

## 2015-07-15 NOTE — Telephone Encounter (Signed)
PA required for Dexilant. Submitted through [facility]. Will await determination.

## 2015-07-21 ENCOUNTER — Ambulatory Visit (INDEPENDENT_AMBULATORY_CARE_PROVIDER_SITE_OTHER): Payer: BLUE CROSS/BLUE SHIELD | Admitting: Psychology

## 2015-07-21 DIAGNOSIS — F4323 Adjustment disorder with mixed anxiety and depressed mood: Secondary | ICD-10-CM | POA: Diagnosis not present

## 2015-07-21 NOTE — Telephone Encounter (Signed)
Pt left v/m requesting prior auth for dexilant. Pt request cb.

## 2015-07-22 NOTE — Telephone Encounter (Addendum)
Message left advising patient that for some reason CMM cancelled PA request. Filled out paper request. In your IN box for completion. Will send in urgent when completed.

## 2015-07-27 ENCOUNTER — Ambulatory Visit (INDEPENDENT_AMBULATORY_CARE_PROVIDER_SITE_OTHER): Payer: BLUE CROSS/BLUE SHIELD | Admitting: Family Medicine

## 2015-07-27 ENCOUNTER — Encounter: Payer: Self-pay | Admitting: *Deleted

## 2015-07-27 ENCOUNTER — Encounter: Payer: Self-pay | Admitting: Family Medicine

## 2015-07-27 VITALS — BP 112/76 | HR 102 | Temp 98.9°F | Wt 252.5 lb

## 2015-07-27 DIAGNOSIS — E559 Vitamin D deficiency, unspecified: Secondary | ICD-10-CM

## 2015-07-27 DIAGNOSIS — J209 Acute bronchitis, unspecified: Secondary | ICD-10-CM

## 2015-07-27 DIAGNOSIS — F418 Other specified anxiety disorders: Secondary | ICD-10-CM

## 2015-07-27 DIAGNOSIS — K219 Gastro-esophageal reflux disease without esophagitis: Secondary | ICD-10-CM

## 2015-07-27 DIAGNOSIS — E1165 Type 2 diabetes mellitus with hyperglycemia: Secondary | ICD-10-CM

## 2015-07-27 DIAGNOSIS — IMO0001 Reserved for inherently not codable concepts without codable children: Secondary | ICD-10-CM

## 2015-07-27 MED ORDER — AZITHROMYCIN 250 MG PO TABS: ORAL_TABLET | ORAL | Status: DC

## 2015-07-27 MED ORDER — GUAIFENESIN-CODEINE 100-10 MG/5ML PO SYRP: 5.0000 mL | ORAL_SOLUTION | Freq: Every evening | ORAL | Status: DC | PRN

## 2015-07-27 NOTE — Telephone Encounter (Signed)
Signed and in Kim's box. 

## 2015-07-27 NOTE — Progress Notes (Signed)
BP 112/76 mmHg  Pulse 102  Temp(Src) 98.9 F (37.2 C) (Oral)  Wt 252 lb 8 oz (114.533 kg)  SpO2 96%  LMP 07/21/2015   CC: 4 mo f/u visit  Subjective:    Patient ID: Lisa Curry, female    DOB: [birth year], 44 y.o.   MRN: [id number]  HPI: Lisa Curry is a 44 y.o. female presenting on 07/27/2015 for Follow-up and Cough   Cough - 5d h/o malaise with cough, chills. Fever to 102 over weekend. No fever today. Feels chest congestion, rattly cough. Roof of mouth is sore. Initially sore throat now better. + headaches. Some dyspnea.   No head congestion, tooth pain, ST, abd pain, diarrhea.  + sick contacts at home. Daughter with possible bronchitis.  No h/o asthma. No smokers at home.  Stayed home from school today.  Taking allegra D, mucinex, ibuprofen.   DM - has not been checking sugars. Complaint with amaryl 8mg  and tradjenta 5mg  daily.   Anxiety /depression - doing well with effexor 225mg  daily in am + wellbutrin 75mg  bid as well as klonopin 0.75mg  daily. Continues seeing Dr Rexene Edison. 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  Relevant past medical, surgical, family and social history reviewed and updated as indicated. Interim medical history since our last visit reviewed. Allergies and medications reviewed and updated. Current Outpatient Prescriptions on File Prior to Visit  Medication Sig  . buPROPion (WELLBUTRIN) 75 MG tablet TAKE 1 TABLET (75 MG TOTAL) BY MOUTH 2 (TWO) TIMES DAILY.  . clonazePAM (KLONOPIN) 0.5 MG tablet TAKE 1 TABLET BY MOUTH DAILY. MAY TAKE 1 ADDITIONAL TABLET BY MOUTH DAILY IF NEEDED  . DEXILANT 60 MG capsule TAKE 1 CAPSULE (60 MG TOTAL) BY MOUTH DAILY.  Marland Kitchen glimepiride (AMARYL) 4 MG tablet Take 2 tablets (8 mg total) by mouth daily with breakfast.  . TRADJENTA 5 MG TABS tablet TAKE 1 TABLET (5 MG TOTAL) BY MOUTH DAILY.  Marland Kitchen venlafaxine XR (EFFEXOR XR) 75 MG 24 hr capsule Take 1 capsule (75 mg total) by mouth daily with breakfast.  .  venlafaxine XR (EFFEXOR-XR) 150 MG 24 hr capsule TAKE ONE CAPSULE BY MOUTH EVERY DAY   No current facility-administered medications on file prior to visit.    Review of Systems Per HPI unless specifically indicated in ROS section     Objective:    BP 112/76 mmHg  Pulse 102  Temp(Src) 98.9 F (37.2 C) (Oral)  Wt 252 lb 8 oz (114.533 kg)  SpO2 96%  LMP 07/21/2015  Wt Readings from Last 3 Encounters:  07/27/15 252 lb 8 oz (114.533 kg)  01/20/15 274 lb (124.286 kg)  10/16/14 268 lb 8 oz (121.791 kg)    Physical Exam  Constitutional: She appears well-developed and well-nourished. No distress.  HENT:  Head: Normocephalic and atraumatic.  Right Ear: Hearing, tympanic membrane, external ear and ear canal normal.  Left Ear: Hearing, tympanic membrane, external ear and ear canal normal.  Nose: No mucosal edema or rhinorrhea. Right sinus exhibits no maxillary sinus tenderness and no frontal sinus tenderness. Left sinus exhibits no maxillary sinus tenderness and no frontal sinus tenderness.  Mouth/Throat: Uvula is midline, oropharynx is clear and moist and mucous membranes are normal. No oropharyngeal exudate, posterior oropharyngeal edema, posterior oropharyngeal erythema or tonsillar abscesses.  Eyes: Conjunctivae and EOM are normal. Pupils are equal, round, and reactive to light. No scleral icterus.  Neck: Normal range of motion. Neck supple. No thyromegaly present.  Cardiovascular: Normal rate, regular rhythm, normal heart sounds and intact distal pulses.   No murmur heard. Pulmonary/Chest: Effort normal and breath sounds normal. No respiratory distress. She has no wheezes. She has no rales.  Rattling cough with clear lungs.   Lymphadenopathy:    She has no cervical adenopathy.  Skin: Skin is warm and dry. No rash noted.  Nursing note and vitals reviewed.     Assessment & Plan:   Problem List Items Addressed This Visit    Vitamin D deficiency    Recheck next labwork.       Relevant Orders   Vit D  25 hydroxy (rtn osteoporosis monitoring)   GERD (gastroesophageal reflux disease)    Taking zantac while we await PPI approval.      Diabetes mellitus type 2, uncontrolled ([place])    Pt requests labwork deferred as she's feeling ill today. Will return 08/12/2015 for labs.      Relevant Orders   Lipid panel   Hemoglobin P1S   Basic metabolic panel   Microalbumin / creatinine urine ratio   Depression with anxiety    Stable on current regimen - continue.      Acute bronchitis - Primary    Acute bronchitis of short duration. Given comorbidities and recent spiking fever, treat aggressively with zpack. cheratussin for cough at night time, continue mucinex during the day, ibuprofen. Discussed supportive care. Update if not improving with treatment. Pt agrees with plan.          Follow up plan: Return if symptoms worsen or fail to improve.

## 2015-07-27 NOTE — Patient Instructions (Addendum)
You have bronchitis - treat with zpack, codeine cough syrup, and continue ibuprofen, mucinex, allegra.  Push fluids and rest. Out of work note for next 3 days. Let us know if not improving with treatment. Continue other meds as up to now.  Return for fasting labs on 08/12/2015.

## 2015-07-27 NOTE — Assessment & Plan Note (Addendum)
Taking zantac while we await PPI approval.

## 2015-07-27 NOTE — Progress Notes (Signed)
Pre visit review using our clinic review tool, if applicable. No additional management support is needed unless otherwise documented below in the visit note. 

## 2015-07-27 NOTE — Assessment & Plan Note (Signed)
Recheck next labwork. 

## 2015-07-27 NOTE — Assessment & Plan Note (Signed)
Pt requests labwork deferred as she's feeling ill today. Will return 08/12/2015 for labs.

## 2015-07-27 NOTE — Telephone Encounter (Signed)
PA faxed

## 2015-07-27 NOTE — Assessment & Plan Note (Signed)
Stable on current regimen - continue. 

## 2015-07-27 NOTE — Assessment & Plan Note (Signed)
Acute bronchitis of short duration. Given comorbidities and recent spiking fever, treat aggressively with zpack. cheratussin for cough at night time, continue mucinex during the day, ibuprofen. Discussed supportive care. Update if not improving with treatment. Pt agrees with plan.

## 2015-07-29 ENCOUNTER — Telehealth: Payer: Self-pay

## 2015-07-29 MED ORDER — CLONAZEPAM 0.5 MG PO TABS: ORAL_TABLET | ORAL | Status: DC

## 2015-07-29 NOTE — Telephone Encounter (Signed)
Pt left v/m; pt was seen 07/27/15 and now pt has blister in roof of mouth, pt has not eaten any hot foods that would cause blister; pt thinks beginning with sinus infection, when gets mucus out of nose is green colored, cough is no better.No S/T and no fever.pt is taking Z pak. Pt wonders since not feeling better and additional symptoms if needs stronger abx. Pt also request refill clonazepam. rx last refilled # 60 on 06/26/15. Last annual exam on 01/20/15.[facility]. Pt request cb.

## 2015-07-29 NOTE — Telephone Encounter (Signed)
plz phone in clonazepam.  I don't recommend change in abx. zpack is long acting abx and should cover bronchitis better than others. Make sure taking mucinex and ibuprofen. Update [place] if no better by end of week or if fever >101.

## 2015-07-29 NOTE — Telephone Encounter (Signed)
Rx called in as directed. Patient notified. She said the cough syrup isn't really helping. It helps the cough, but it isn't allowing her to sleep. It isn't making her "hyper", but it's just keeping her awake. Could she possibly get something different?

## 2015-07-30 MED ORDER — HYDROCODONE-HOMATROPINE 5-1.5 MG/5ML PO SYRP: 5.0000 mL | ORAL_SOLUTION | Freq: Three times a day (TID) | ORAL | Status: DC | PRN

## 2015-07-30 NOTE — Telephone Encounter (Signed)
We could try hydrocodone cough syrup but needs to watch sugar on new syrup. Printed and in [facility] box.

## 2015-07-30 NOTE — Telephone Encounter (Signed)
Pt notified Rx ready for pick-up and advised of Dr. Synthia Innocent comments/ instructions and verbalized understanding

## 2015-08-06 ENCOUNTER — Telehealth: Payer: Self-pay | Admitting: Family Medicine

## 2015-08-06 MED ORDER — AMOXICILLIN-POT CLAVULANATE 875-125 MG PO TABS
1.0000 | ORAL_TABLET | Freq: Two times a day (BID) | ORAL | Status: AC
Start: 2015-08-06 — End: 2015-08-16

## 2015-08-06 NOTE — Telephone Encounter (Signed)
Spoke with patient. She was feeling much better and then 2 days ago, started coughing bad again and has chest congestion. No fever. Just finished abx 6 days ago. Should she extend abx?  Dexilant approved. Pt notified.

## 2015-08-06 NOTE — Telephone Encounter (Signed)
Have sent in another abx course (augmentin). Return if not improved.

## 2015-08-06 NOTE — Telephone Encounter (Signed)
Pt request call back regarding symptoms - were getting better and then started coming back.   Also wants to check on prior authorization cb number is [phone]

## 2015-08-07 NOTE — Telephone Encounter (Signed)
Finally approved. Patient aware.

## 2015-08-07 NOTE — Telephone Encounter (Signed)
Message left notifying patient.

## 2015-08-09 ENCOUNTER — Other Ambulatory Visit: Payer: Self-pay | Admitting: Family Medicine

## 2015-08-12 ENCOUNTER — Other Ambulatory Visit (INDEPENDENT_AMBULATORY_CARE_PROVIDER_SITE_OTHER): Payer: BLUE CROSS/BLUE SHIELD

## 2015-08-12 DIAGNOSIS — IMO0001 Reserved for inherently not codable concepts without codable children: Secondary | ICD-10-CM

## 2015-08-12 DIAGNOSIS — E1165 Type 2 diabetes mellitus with hyperglycemia: Secondary | ICD-10-CM | POA: Diagnosis not present

## 2015-08-12 DIAGNOSIS — E559 Vitamin D deficiency, unspecified: Secondary | ICD-10-CM

## 2015-08-12 LAB — LIPID PANEL
Cholesterol: 180 mg/dL (ref 0–200)
HDL: 39.6 mg/dL (ref >39.00)
LDL Cholesterol: 108 mg/dL — ABNORMAL HIGH (ref 0–99)
NonHDL: 140.35
Total CHOL/HDL Ratio: 5
Triglycerides: 161 mg/dL — ABNORMAL HIGH (ref 0.0–149.0)
VLDL: 32.2 mg/dL (ref 0.0–40.0)

## 2015-08-12 LAB — BASIC METABOLIC PANEL
BUN: 7 mg/dL (ref 6–23)
CO2: 29 mEq/L (ref 19–32)
Calcium: 9.1 mg/dL (ref 8.4–10.5)
Chloride: 102 mEq/L (ref 96–112)
Creatinine, Ser: 0.76 mg/dL (ref 0.40–1.20)
GFR: 87.34 mL/min (ref >60.00)
Glucose, Bld: 90 mg/dL (ref 70–99)
Potassium: 3.8 mEq/L (ref 3.5–5.1)
Sodium: 139 mEq/L (ref 135–145)

## 2015-08-12 LAB — MICROALBUMIN / CREATININE URINE RATIO
Creatinine,U: 47.5 mg/dL
Microalb Creat Ratio: 1.5 mg/g (ref 0.0–30.0)
Microalb, Ur: <0.7 mg/dL (ref 0.0–1.9)

## 2015-08-12 LAB — HEMOGLOBIN A1C: Hgb A1c MFr Bld: 6.9 % — ABNORMAL HIGH (ref 4.6–6.5)

## 2015-08-12 LAB — VITAMIN D 25 HYDROXY (VIT D DEFICIENCY, FRACTURES): VITD: 20.15 ng/mL — ABNORMAL LOW (ref 30.00–100.00)

## 2015-08-15 ENCOUNTER — Other Ambulatory Visit: Payer: Self-pay | Admitting: Family Medicine

## 2015-08-15 MED ORDER — VITAMIN D3 25 MCG (1000 UT) PO CAPS: 1.0000 | ORAL_CAPSULE | Freq: Every day | ORAL | Status: DC

## 2015-08-17 ENCOUNTER — Encounter: Payer: Self-pay | Admitting: *Deleted

## 2015-08-17 ENCOUNTER — Telehealth: Payer: Self-pay

## 2015-08-17 NOTE — Telephone Encounter (Signed)
Returned call. It was to [facility] [employer] who could not locate patient in the system. I haven't been working on a PA for any med other than Dexilant which was approved over the phone on 08/06/15. Not sure what this is in reference to. Closing note.

## 2015-08-17 NOTE — Telephone Encounter (Signed)
Orlando Penner with [employer] prior auth left v/m requesting cb to prior auth dept needing additional info for prior auth; did not leave name of med.

## 2015-08-21 ENCOUNTER — Encounter (HOSPITAL_BASED_OUTPATIENT_CLINIC_OR_DEPARTMENT_OTHER): Payer: Self-pay | Admitting: *Deleted

## 2015-08-21 ENCOUNTER — Emergency Department (HOSPITAL_BASED_OUTPATIENT_CLINIC_OR_DEPARTMENT_OTHER)
Admission: EM | Admit: 2015-08-21 | Discharge: 2015-08-21 | Disposition: A | Discharge status: Home / Self Care | Payer: Worker's Compensation | Attending: Emergency Medicine | Admitting: Emergency Medicine

## 2015-08-21 ENCOUNTER — Emergency Department (HOSPITAL_BASED_OUTPATIENT_CLINIC_OR_DEPARTMENT_OTHER): Payer: Worker's Compensation

## 2015-08-21 DIAGNOSIS — E119 Type 2 diabetes mellitus without complications: Secondary | ICD-10-CM | POA: Diagnosis not present

## 2015-08-21 DIAGNOSIS — W230XXA Caught, crushed, jammed, or pinched between moving objects, initial encounter: Secondary | ICD-10-CM | POA: Insufficient documentation

## 2015-08-21 DIAGNOSIS — Z8619 Personal history of other infectious and parasitic diseases: Secondary | ICD-10-CM | POA: Diagnosis not present

## 2015-08-21 DIAGNOSIS — Z79899 Other long term (current) drug therapy: Secondary | ICD-10-CM | POA: Insufficient documentation

## 2015-08-21 DIAGNOSIS — Z7984 Long term (current) use of oral hypoglycemic drugs: Secondary | ICD-10-CM | POA: Insufficient documentation

## 2015-08-21 DIAGNOSIS — K219 Gastro-esophageal reflux disease without esophagitis: Secondary | ICD-10-CM | POA: Insufficient documentation

## 2015-08-21 DIAGNOSIS — Y9289 Other specified places as the place of occurrence of the external cause: Secondary | ICD-10-CM | POA: Insufficient documentation

## 2015-08-21 DIAGNOSIS — F418 Other specified anxiety disorders: Secondary | ICD-10-CM | POA: Diagnosis not present

## 2015-08-21 DIAGNOSIS — E559 Vitamin D deficiency, unspecified: Secondary | ICD-10-CM | POA: Diagnosis not present

## 2015-08-21 DIAGNOSIS — S6721XA Crushing injury of right hand, initial encounter: Secondary | ICD-10-CM | POA: Insufficient documentation

## 2015-08-21 DIAGNOSIS — S60221A Contusion of right hand, initial encounter: Secondary | ICD-10-CM | POA: Diagnosis not present

## 2015-08-21 DIAGNOSIS — E785 Hyperlipidemia, unspecified: Secondary | ICD-10-CM | POA: Insufficient documentation

## 2015-08-21 DIAGNOSIS — Y9389 Activity, other specified: Secondary | ICD-10-CM | POA: Diagnosis not present

## 2015-08-21 DIAGNOSIS — S60011A Contusion of right thumb without damage to nail, initial encounter: Secondary | ICD-10-CM | POA: Insufficient documentation

## 2015-08-21 DIAGNOSIS — Y998 Other external cause status: Secondary | ICD-10-CM | POA: Insufficient documentation

## 2015-08-21 MED ORDER — HYDROCODONE-ACETAMINOPHEN 5-325 MG PO TABS: 1.0000 | ORAL_TABLET | ORAL | Status: DC | PRN

## 2015-08-21 MED ORDER — IBUPROFEN 600 MG PO TABS: 600.0000 mg | ORAL_TABLET | Freq: Four times a day (QID) | ORAL | Status: DC | PRN

## 2015-08-21 MED ORDER — OXYCODONE-ACETAMINOPHEN 5-325 MG PO TABS
1.0000 | ORAL_TABLET | Freq: Once | ORAL | Status: AC
  Administered 2015-08-21: 1 via ORAL
  Filled 2015-08-21: qty 1

## 2015-08-21 MED ORDER — ONDANSETRON HCL 8 MG PO TABS
4.0000 mg | ORAL_TABLET | Freq: Once | ORAL | Status: AC
  Administered 2015-08-21: 4 mg via ORAL
  Filled 2015-08-21: qty 1

## 2015-08-21 NOTE — ED Provider Notes (Signed)
CSN: [id number]     Arrival date & time 08/21/15  1828 History   First MD Initiated Contact with Patient 08/21/15 1831     Chief Complaint  Patient presents with  . Hand Injury     (Consider location/radiation/quality/duration/timing/severity/associated sxs/prior Treatment) HPI   Lisa Curry is a 44 y.o. female  PCP: Ria Bush, MD  Blood pressure 137/67, pulse 87, temperature 98.1 F (36.7 C), temperature source Oral, resp. rate 20, height 5\' 4"  (1.626 m), weight 113.399 kg, last menstrual period 07/21/2015, SpO2 100 %.  SIGNIFICANT PMH: depression with anxiety, allergy, GERD CHIEF COMPLAINT: right hand injury  When: this evening she accidentally closed the window on her hand Chronicity: acute Location: thumb and pointer finger, also posterior palm Radiation: palm Quality and severity: throbbing, aching and sharp pains  Treatments tried: ice and rest Alleviating factors: none Worsening factors: ice, moving and touching hand makes it worse Associated Symptoms: pain Risk Factors: none  Negative ROS: Confusion, diaphoresis, fever, headache, weakness (general or focal), change of vision,  neck pain, dysphagia, aphagia, chest pain, shortness of breath,  back pain, abdominal pains, nausea, vomiting, diarrhea, lower extremity swelling, rash.    Past Medical History  Diagnosis Date  . Depression with anxiety   . Diabetes mellitus without complication ([place])     DSME 04/2014  . Allergy   . GERD (gastroesophageal reflux disease)   . Hyperlipidemia   . Barrett esophagus     [place], have not received EGD report  . Vitamin D deficiency   . History of Helicobacter pylori infection   . Gastric polyp 05/2012    h/o EGD  . HLD (hyperlipidemia)    Past Surgical History  Procedure Laterality Date  . Cesarean section  6/95, 4/98  . Back surgery  03/1995  . Nasal sinus surgery  03/2001  . Dilation and curettage of uterus  2011  . Esophagogastroduodenoscopy  2012?    Ferdinand Lango  at [facility]   Family History  Problem Relation Age of Onset  . Breast cancer Maternal Grandmother 81  . Heart disease Paternal Grandfather   . Supraventricular tachycardia Sister   . Other Sister     Postural Orthostatic Tachycardia Syndrome  . Other Maternal Aunt     hysterectomy for irregular bleed with abnormal pap   Social History  Substance Use Topics  . Smoking status: Never Smoker   . Smokeless tobacco: Never Used  . Alcohol Use: No   OB History    No data available     Review of Systems  All other systems reviewed and are negative.     Allergies  Biaxin and Metformin and related  Home Medications   Prior to Admission medications   Medication Sig Start Date End Date Taking? Authorizing Provider  azithromycin (ZITHROMAX) 250 MG tablet Take two tablets on day one followed by one tablet on days 2-5 07/27/15   Ria Bush, MD  buPROPion (WELLBUTRIN) 75 MG tablet TAKE 1 TABLET (75 MG TOTAL) BY MOUTH 2 (TWO) TIMES DAILY. 03/11/15   Ria Bush, MD  Cholecalciferol (VITAMIN D3) 1000 UNITS CAPS Take 1 capsule (1,000 Units total) by mouth daily. 08/15/15   Ria Bush, MD  clonazePAM (KLONOPIN) 0.5 MG tablet TAKE 1 TABLET BY MOUTH DAILY. MAY TAKE 1 ADDITIONAL TABLET BY MOUTH DAILY IF NEEDED 07/29/15   Ria Bush, MD  DEXILANT 60 MG capsule TAKE 1 CAPSULE (60 MG TOTAL) BY MOUTH DAILY. 02/06/15   Ria Bush, MD  glimepiride (  AMARYL) 4 MG tablet Take 2 tablets (8 mg total) by mouth daily with breakfast. 10/16/14   Ria Bush, MD  HYDROcodone-homatropine [facility]) 5-1.5 MG/5ML syrup Take 5 mLs by mouth every 8 (eight) hours as needed for cough. 07/30/15   Ria Bush, MD  TRADJENTA 5 MG TABS tablet TAKE 1 TABLET (5 MG TOTAL) BY MOUTH DAILY. 07/15/15   Ria Bush, MD  venlafaxine XR (EFFEXOR-XR) 150 MG 24 hr capsule TAKE ONE CAPSULE BY MOUTH EVERY DAY 07/08/15   Ria Bush, MD  venlafaxine XR (EFFEXOR-XR) 75 MG 24 hr  capsule TAKE ONE CAPSULE BY MOUTH EVERY DAY 08/10/15   Ria Bush, MD   BP 137/67 mmHg  Pulse 87  Temp(Src) 98.1 F (36.7 C) (Oral)  Resp 20  Ht 5\' 4"  (1.626 m)  Wt 113.399 kg  BMI 42.89 kg/m2  SpO2 100%  LMP 07/21/2015 Physical Exam  Constitutional: She appears well-developed and well-nourished. No distress.  HENT:  Head: Normocephalic and atraumatic.  Eyes: Pupils are equal, round, and reactive to light.  Neck: Normal range of motion. Neck supple.  Cardiovascular: Normal rate and regular rhythm.   Pulmonary/Chest: Effort normal.  Abdominal: Soft.  Musculoskeletal:       Right hand: She exhibits tenderness (tenderness to hand, wrist and forearm), bony tenderness and swelling (small amount of swelling and ecchymosis to right thumb and posterior palm). She exhibits normal range of motion and normal two-point discrimination. Normal sensation noted. Decreased strength (due to pain) noted.  CR < 2 seconds Sensations intact  Neurological: She is alert.  Skin: Skin is warm and dry.  Nursing note and vitals reviewed.   ED Course  Procedures (including critical care time) Labs Review Labs Reviewed - No data to display  Imaging Review Dg Hand Complete Right  08/21/2015  CLINICAL DATA:  Right hand injury.  First digit swelling EXAM: RIGHT HAND - COMPLETE 3+ VIEW COMPARISON:  None. FINDINGS: There is no evidence of fracture or dislocation. There is no evidence of arthropathy or other focal bone abnormality. Soft tissues are unremarkable. IMPRESSION: Negative. Electronically Signed   By: Kathreen Devoid   On: 08/21/2015 18:49   I have personally reviewed and evaluated these images and lab results as part of my medical decision-making.   EKG Interpretation None      MDM   Final diagnoses:  Crush injury to hand, right, initial encounter    Given wrist splint with thumb spica, RICE and NSAIDs. Pt request a few tabs of stronger pain meds. Hand referral  Given. No neurovascular  deficits.  Medications  oxyCODONE-acetaminophen (PERCOCET/ROXICET) 5-325 MG per tablet 1 tablet (not administered)  ondansetron (ZOFRAN) tablet 4 mg (not administered)    I feel the patient has had an appropriate workup for their chief complaint at this time and likelihood of emergent condition existing is low. Discussed s/sx that warrant return to the ED.  Filed Vitals:   08/21/15 1832  BP: 137/67  Pulse: 87  Temp: 98.1 F (36.7 C)  Resp: 20      Delos Haring, PA-C 08/21/15 [place], DO 08/21/15 1912

## 2015-08-21 NOTE — ED Notes (Signed)
[facility] EMT placed splint onto R wrist

## 2015-08-21 NOTE — ED Notes (Signed)
Ice pack given

## 2015-08-21 NOTE — Discharge Instructions (Signed)

## 2015-08-21 NOTE — ED Notes (Signed)
Right hand injury. She was closing a window and it fell across her hand. Abrasion to her thumb.

## 2015-08-28 ENCOUNTER — Telehealth: Payer: Self-pay | Admitting: Family Medicine

## 2015-08-28 ENCOUNTER — Telehealth: Payer: Self-pay

## 2015-08-28 MED ORDER — CLONAZEPAM 0.5 MG PO TABS: ORAL_TABLET | ORAL | Status: DC

## 2015-08-28 MED ORDER — BUPROPION HCL 75 MG PO TABS: 75.0000 mg | ORAL_TABLET | Freq: Three times a day (TID) | ORAL | Status: DC

## 2015-08-28 NOTE — Telephone Encounter (Signed)
[facility]  Patient Name: Lisa Curry  DOB: 1970/09/28    Initial Comment Receptionist states patient needs to speak with someone about depression urgently, tried to transfer patient to me but she disconnected call   Nurse Assessment  Nurse: Wayne Sever, RN, Tillie Rung Date/Time (Eastern Time): 08/28/2015 3:37:07 PM  Confirm and document reason for call. If symptomatic, describe symptoms. ---Caller states she already spoke with the nurse from the office and things have been taken care of. She denies needing my assistance.  Has the patient traveled out of the country within the last 30 days? ---Not Applicable  Does the patient have any new or worsening symptoms? ---No     Guidelines    Guideline Title Affirmed Question Affirmed Notes       Final Disposition User   Clinical Call [place], Therapist, sports, Tillie Rung

## 2015-08-28 NOTE — Telephone Encounter (Signed)
See phone note 08/27/15.

## 2015-08-28 NOTE — Addendum Note (Signed)
Addended by: Ria Bush on: 08/28/2015 05:59 PM   Modules accepted: Orders

## 2015-08-28 NOTE — Telephone Encounter (Signed)
Pt said she has been crying easily for few days; feels depressed. Pt asked her son this morning to take her to [facility]; pts son responded LOL. Pt said she was serious. Pt is presently at work at [employer]. Pt has not missed any doses of Venlafaxine, clonazepam or bupropion. Spoke with Dr Darnell Level and he advised to go to ED if needed for eval and schedule 1st available appt next week at [facility]. Pt cannot get off work for appt at [facility] until possibly 09/03/15(pt did not make appt). No SI-HI. Pt said she will go to ED if feels worse but wants to know if any med adjustments could be done. Note to Dr Darnell Level. Dr Darnell Level will review chart. [facility].

## 2015-08-28 NOTE — Telephone Encounter (Signed)
Spoke with patient.  More crying episodes.  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 Son's friend committed suicide.  Rec increase wellbutrin to 75mg  TID.  Encouraged return to see Dr Rexene Edison - she has scheduled appt for 09/10/2015.

## 2015-09-10 ENCOUNTER — Ambulatory Visit (INDEPENDENT_AMBULATORY_CARE_PROVIDER_SITE_OTHER): Payer: BLUE CROSS/BLUE SHIELD | Admitting: Psychology

## 2015-09-10 DIAGNOSIS — F332 Major depressive disorder, recurrent severe without psychotic features: Secondary | ICD-10-CM

## 2015-10-01 ENCOUNTER — Other Ambulatory Visit: Payer: Self-pay | Admitting: Family Medicine

## 2015-10-01 NOTE — Telephone Encounter (Signed)
08/28/2015 

## 2015-10-02 ENCOUNTER — Other Ambulatory Visit: Payer: Self-pay | Admitting: Family Medicine

## 2015-10-02 NOTE — Telephone Encounter (Signed)
Pt said klonopin is not at [place]; kayla at [facility] does have refill authorization but med is not ready for pick up. Pt notified should be ready for pick up in about 1 hour.pt voiced understanding.

## 2015-10-02 NOTE — Telephone Encounter (Signed)
Rx called in as directed.   

## 2015-10-02 NOTE — Telephone Encounter (Signed)
plz phone in. 

## 2015-10-13 ENCOUNTER — Other Ambulatory Visit: Payer: Self-pay | Admitting: Family Medicine

## 2015-10-21 LAB — HM DIABETES EYE EXAM

## 2015-10-29 ENCOUNTER — Other Ambulatory Visit: Payer: Self-pay | Admitting: Family Medicine

## 2015-10-29 DIAGNOSIS — S60221A Contusion of right hand, initial encounter: Secondary | ICD-10-CM | POA: Insufficient documentation

## 2015-10-29 NOTE — Telephone Encounter (Signed)
i assume she means estrogen therapy. How regular are cycles? This may be an option but we need to discuss pros/cons. Suggest we discuss this at next visit.

## 2015-10-29 NOTE — Telephone Encounter (Signed)
Pt called wanting to know dr g thoughts estroden?  Please advise pt

## 2015-10-30 NOTE — Telephone Encounter (Signed)
Spoke with patient and she wanted to know about ESTROVEN that's over the counter, pt did not want to schedule and appt, and got upset and stated she would call back.

## 2015-10-30 NOTE — Telephone Encounter (Signed)
I see - that is a brand that has different ingredients depending on product chosen (menopause relief, weight management, energy, etc) - should be ok to try. It may contain black cohosh supplement that is estrogenic.  Which specific product is she interested in?

## 2015-11-03 MED ORDER — GLUCOSE BLOOD VI STRP: 1.0000 | ORAL_STRIP | Freq: Every day | Status: DC

## 2015-11-03 NOTE — Telephone Encounter (Signed)
Spoke with patient. She was more or less asking for general knowledge. She said the week before and of her period, she becomes extremely moody, tearful, angry amongst other symptoms and was wondering if it may be beneficial or if she may need something else. She acknowledges being under a great deal of stress and doesn't know if that is playing a role or if it is just bad PMS.   Also requests refill on clonazepam.  Also asks when she should follow up. No mention in last office note.

## 2015-11-04 MED ORDER — CLONAZEPAM 0.5 MG PO TABS: ORAL_TABLET | ORAL | Status: DC

## 2015-11-04 NOTE — Telephone Encounter (Signed)
plz phone in klonopin. plz schedule CPE after 01/20/2016, may come in sooner to discuss stress/anxiety if desired

## 2015-11-05 NOTE — Telephone Encounter (Signed)
Rx called in as directed.   

## 2015-11-06 ENCOUNTER — Other Ambulatory Visit: Payer: Self-pay

## 2015-11-06 MED ORDER — GLUCOSE BLOOD VI STRP: 1.0000 | ORAL_STRIP | Freq: Two times a day (BID) | Status: DC | PRN

## 2015-11-06 NOTE — Telephone Encounter (Signed)
Pt left v/m; pt request instructions changed to ck blood sugar twice a day instead of once daily; the copay was too expensive by taking BS once daily; pt also thinks will have better control of BS if checked more often than once daily. Pt request cb. [facility]. (have not changed instructions until approved by Dr Darnell Level.) Seen 07/27/15.

## 2015-11-06 NOTE — Telephone Encounter (Signed)
Per Vaughan Basta T,CPE scheduled

## 2015-11-06 NOTE — Telephone Encounter (Signed)
Sent in BID dosing. Will see if covered by insurance (may not be as she is not on insulin).  Plz notify pt. Lab Results  Component Value Date   HGBA1C 6.9* 08/12/2015

## 2015-11-09 NOTE — Telephone Encounter (Signed)
Patient notified

## 2015-11-30 ENCOUNTER — Ambulatory Visit (INDEPENDENT_AMBULATORY_CARE_PROVIDER_SITE_OTHER): Payer: BLUE CROSS/BLUE SHIELD | Admitting: Primary Care

## 2015-11-30 ENCOUNTER — Encounter: Payer: Self-pay | Admitting: Primary Care

## 2015-11-30 VITALS — BP 122/76 | HR 108 | Temp 99.1°F | Ht 64.0 in | Wt 267.0 lb

## 2015-11-30 DIAGNOSIS — J101 Influenza due to other identified influenza virus with other respiratory manifestations: Secondary | ICD-10-CM

## 2015-11-30 LAB — POC INFLUENZA A&B (BINAX/QUICKVUE)
Influenza A, POC: POSITIVE — AB
Influenza B, POC: NEGATIVE

## 2015-11-30 MED ORDER — OSELTAMIVIR PHOSPHATE 75 MG PO CAPS: 75.0000 mg | ORAL_CAPSULE | Freq: Two times a day (BID) | ORAL | Status: DC

## 2015-11-30 NOTE — Patient Instructions (Signed)
Your flu test was positive.  Start Tamiflu capsules for the flu. Take 1 capsule by mouth twice daily for 10 days.  Cough/Congestion: Try taking Mucinex DM. This will help loosen up the mucous in your chest. Ensure you take this medication with a full glass of water.   Nasal Congestion: Try using Flonase (fluticasone) nasal spray. Instill 2 sprays in each nostril once daily.   Ensure you stay hydrated with water.  It was a pleasure meeting you!  Influenza, Adult Influenza ("the flu") is a viral infection of the respiratory tract. It occurs more often in winter months because people spend more time in close contact with one another. Influenza can make you feel very sick. Influenza easily spreads from person to person (contagious). CAUSES  Influenza is caused by a virus that infects the respiratory tract. You can catch the virus by breathing in droplets from an infected person's cough or sneeze. You can also catch the virus by touching something that was recently contaminated with the virus and then touching your mouth, nose, or eyes. RISKS AND COMPLICATIONS You may be at risk for a more severe case of influenza if you smoke cigarettes, have diabetes, have chronic heart disease (such as heart failure) or lung disease (such as asthma), or if you have a weakened immune system. Elderly people and pregnant women are also at risk for more serious infections. The most common problem of influenza is a lung infection (pneumonia). Sometimes, this problem can require emergency medical care and may be life threatening. SIGNS AND SYMPTOMS  Symptoms typically last 4 to 10 days and may include:  Fever.  Chills.  Headache, body aches, and muscle aches.  Sore throat.  Chest discomfort and cough.  Poor appetite.  Weakness or feeling tired.  Dizziness.  Nausea or vomiting. DIAGNOSIS  Diagnosis of influenza is often made based on your history and a physical exam. A nose or throat swab test can be  done to confirm the diagnosis. TREATMENT  In mild cases, influenza goes away on its own. Treatment is directed at relieving symptoms. For more severe cases, your health care provider may prescribe antiviral medicines to shorten the sickness. Antibiotic medicines are not effective because the infection is caused by a virus, not by bacteria. HOME CARE INSTRUCTIONS  Take medicines only as directed by your health care provider.  Use a cool mist humidifier to make breathing easier.  Get plenty of rest until your temperature returns to normal. This usually takes 3 to 4 days.  Drink enough fluid to keep your urine clear or pale yellow.  Cover yourmouth and nosewhen coughing or sneezing,and wash your handswellto prevent thevirusfrom spreading.  Stay homefromwork orschool untilthe fever is gonefor at least 19full day. PREVENTION  An annual influenza vaccination (flu shot) is the best way to avoid getting influenza. An annual flu shot is now routinely recommended for all adults in the [place] IF:  You experiencechest pain, yourcough worsens,or you producemore mucus.  Youhave nausea,vomiting, ordiarrhea.  Your fever returns or gets worse. SEEK IMMEDIATE MEDICAL CARE IF:  You havetrouble breathing, you become short of breath,or your skin ornails becomebluish.  You have severe painor stiffnessin the neck.  You develop a sudden headache, or pain in the face or ear.  You have nausea or vomiting that you cannot control. MAKE SURE YOU:   Understand these instructions.  Will watch your condition.  Will get help right away if you are not doing well or get worse.  This information is not intended to replace advice given to you by your health care provider. Make sure you discuss any questions you have with your health care provider.   Document Released: 09/02/2000 Document Revised: 09/26/2014 Document Reviewed: 12/05/2011 Elsevier Interactive Patient  Education [employer].

## 2015-11-30 NOTE — Progress Notes (Signed)
Subjective:    Patient ID: Lisa Curry, female    DOB: Apr 07, 1971, 45 y.o.   MRN: [id number]  HPI  Ms. Decatur is a 45 year old female with a history of type 2 diabetes and obesity who presents today with a chief complaint of cough. She also reports fevers, ear pain, nasal congestion, sore throat, body aches. She coughed up a small amount of thick, green mucous Saturday afternoon. Her symptoms began Saturday morning after waking. Denies sick contacts. She's taken Allegra D and Mucinex with some improvement in symptoms.   Review of Systems  Constitutional: Positive for fever, chills and fatigue.  HENT: Positive for congestion, sinus pressure and sore throat.   Respiratory: Positive for cough. Negative for shortness of breath.   Cardiovascular: Negative for chest pain.  Musculoskeletal: Positive for myalgias.       Past Medical History  Diagnosis Date  . Depression with anxiety   . Diabetes mellitus without complication ([place])     DSME 04/2014  . Allergy   . GERD (gastroesophageal reflux disease)   . Hyperlipidemia   . Barrett esophagus     Don Bulla, have not received EGD report  . Vitamin D deficiency   . History of Helicobacter pylori infection   . Gastric polyp 05/2012    h/o EGD  . HLD (hyperlipidemia)     Social History   Social History  . Marital Status: Married    Spouse Name: N/A  . Number of Children: N/A  . Years of Education: N/A   Occupational History  . Not on file.   Social History Main Topics  . Smoking status: Never Smoker   . Smokeless tobacco: Never Used  . Alcohol Use: No  . Drug Use: No  . Sexual Activity: Not on file   Other Topics Concern  . Not on file   Social History Narrative   Lives with husband and 2 children, 3 dogs, 2 cats and 2 duck   Occ: special ed Oceanographer.   Activity: no regular exercise   Diet: good water, fruits/vegetables daily    Past Surgical History  Procedure Laterality Date  . Cesarean section  6/95,  4/98  . Back surgery  03/1995  . Nasal sinus surgery  03/2001  . Dilation and curettage of uterus  2011  . Esophagogastroduodenoscopy  2012?    Ferdinand Lango at [facility]    Family History  Problem Relation Age of Onset  . Breast cancer Maternal Grandmother 50  . Heart disease Paternal Grandfather   . Supraventricular tachycardia Sister   . Other Sister     Postural Orthostatic Tachycardia Syndrome  . Other Maternal Aunt     hysterectomy for irregular bleed with abnormal pap    Allergies  Allergen Reactions  . Biaxin [Clarithromycin] Hives  . Metformin And Related Diarrhea    Current Outpatient Prescriptions on File Prior to Visit  Medication Sig Dispense Refill  . buPROPion (WELLBUTRIN) 75 MG tablet Take 1 tablet (75 mg total) by mouth 3 (three) times daily. (Patient taking differently: Take 75 mg by mouth 2 (two) times daily. ) 90 tablet 6  . Cholecalciferol (VITAMIN D3) 1000 UNITS CAPS Take 1 capsule (1,000 Units total) by mouth daily. 30 capsule   . clonazePAM (KLONOPIN) 0.5 MG tablet TAKE 1 TABLET BY MOUTH DAILY. MAY TAKE 1 ADDITIONAL TABLET DAILY AS NEEDED FOR ANXIETY 60 tablet 0  . DEXILANT 60 MG capsule TAKE 1 CAPSULE (60 MG TOTAL) BY MOUTH  DAILY. 30 capsule 11  . glimepiride (AMARYL) 4 MG tablet TAKE 2 TABLETS (8 MG TOTAL) BY MOUTH DAILY WITH BREAKFAST. 60 tablet 11  . glucose blood test strip 1 each by Other route 2 (two) times daily as needed for other. Use to check sugar twice daily and as needed. Dx:E11.65 180 each 3  . HYDROcodone-acetaminophen (NORCO/VICODIN) 5-325 MG tablet Take 1 tablet by mouth every 4 (four) hours as needed. 6 tablet 0  . ibuprofen (ADVIL,MOTRIN) 600 MG tablet Take 1 tablet (600 mg total) by mouth every 6 (six) hours as needed. 30 tablet 0  . TRADJENTA 5 MG TABS tablet TAKE 1 TABLET (5 MG TOTAL) BY MOUTH DAILY. 30 tablet 6  . venlafaxine XR (EFFEXOR-XR) 150 MG 24 hr capsule TAKE ONE CAPSULE BY MOUTH EVERY DAY 30 capsule 11  . venlafaxine XR  (EFFEXOR-XR) 75 MG 24 hr capsule TAKE ONE CAPSULE BY MOUTH EVERY DAY 30 capsule 11   No current facility-administered medications on file prior to visit.    BP 122/76 mmHg  Pulse 108  Temp(Src) 99.1 F (37.3 C) (Oral)  Ht 5\' 4"  (1.626 m)  Wt 267 lb (121.11 kg)  BMI 45.81 kg/m2  SpO2 97%  LMP 11/22/2015    Objective:   Physical Exam  Constitutional: She appears well-nourished. She appears ill.  HENT:  Right Ear: Tympanic membrane and ear canal normal.  Left Ear: Tympanic membrane and ear canal normal.  Nose: Mucosal edema present. Right sinus exhibits no maxillary sinus tenderness and no frontal sinus tenderness. Left sinus exhibits no maxillary sinus tenderness and no frontal sinus tenderness.  Mouth/Throat: Oropharynx is clear and moist.  Eyes: Conjunctivae are normal.  Neck: Neck supple.  Cardiovascular: Regular rhythm.   Sinus tachycardia at 106  Pulmonary/Chest: Effort normal and breath sounds normal. She has no wheezes. She has no rales.  Skin: Skin is warm.  Slightly diaphoretic          Assessment & Plan:  Influenza A:  Cough, congestion, fevers, body aches x 2 days. Temporary improvement with OTC treatment. Exam with clear lungs and mostly unremarkable overall, except for her appearance. Appears ill and weak. Low grade fever with sinus tachycardia on exam. Rapid Flu: Positive for A Tamiflu RX sent to pharmacy as she is within the treatment timeframe. Flonase, fluids, rest, tylenol/ibuprofen for fevers and body aches. Follow up PRN.

## 2015-12-01 ENCOUNTER — Telehealth: Payer: Self-pay

## 2015-12-01 NOTE — Telephone Encounter (Signed)
Pt was seen 11/30/15 with type A flu and pt wants to know how long she has to stay in away from people; advised per AVS instructions stay home until fever gone for at lease one full day. Pt voiced understanding.

## 2015-12-04 ENCOUNTER — Other Ambulatory Visit: Payer: Self-pay | Admitting: Family Medicine

## 2015-12-04 NOTE — Telephone Encounter (Signed)
Rx called in as directed.   

## 2015-12-04 NOTE — Telephone Encounter (Signed)
Ok to refill in Dr. Synthia Innocent absence? Last filled 11/04/15 #[id number] 0 RF

## 2015-12-09 ENCOUNTER — Telehealth: Payer: Self-pay | Admitting: Family Medicine

## 2015-12-09 DIAGNOSIS — R05 Cough: Secondary | ICD-10-CM

## 2015-12-09 DIAGNOSIS — R059 Cough, unspecified: Secondary | ICD-10-CM

## 2015-12-09 MED ORDER — BENZONATATE 200 MG PO CAPS: 200.0000 mg | ORAL_CAPSULE | Freq: Three times a day (TID) | ORAL | Status: DC | PRN

## 2015-12-09 NOTE — Telephone Encounter (Signed)
Pt was seen a week a go, has taken all the tamiflu and still has a cough and is somewhat wheezing.  Pt request cb [phone] Thank you

## 2015-12-09 NOTE — Telephone Encounter (Signed)
This can be expected as Tamiflu does not cure the flu, it just helps to shorten the duration. I've sent in a prescription for tessalon pearls to help with cough. She may take 1 capsule by mouth every 8 hours as needed for cough. Her symptoms may continue up through this weekend, but she should be feeling better in a few days. If no improvement then I suggest she be re-evaluated.

## 2015-12-10 NOTE — Telephone Encounter (Signed)
Spoke to patient. She is feeling better. Just the cough. I advised her the cough can linger for 2 to 3 weeks. If she is still coughing after the weekend, she will make an OV

## 2015-12-15 ENCOUNTER — Ambulatory Visit (INDEPENDENT_AMBULATORY_CARE_PROVIDER_SITE_OTHER): Payer: BLUE CROSS/BLUE SHIELD | Admitting: Family Medicine

## 2015-12-15 ENCOUNTER — Ambulatory Visit (INDEPENDENT_AMBULATORY_CARE_PROVIDER_SITE_OTHER)
Admission: RE | Admit: 2015-12-15 | Discharge: 2015-12-15 | Disposition: A | Discharge status: Home / Self Care | Payer: BLUE CROSS/BLUE SHIELD | Source: Ambulatory Visit | Attending: Family Medicine | Admitting: Family Medicine

## 2015-12-15 ENCOUNTER — Encounter: Payer: Self-pay | Admitting: Family Medicine

## 2015-12-15 VITALS — BP 126/72 | HR 93 | Temp 98.3°F | Ht 64.0 in | Wt 273.6 lb

## 2015-12-15 DIAGNOSIS — R059 Cough, unspecified: Secondary | ICD-10-CM

## 2015-12-15 DIAGNOSIS — M25562 Pain in left knee: Secondary | ICD-10-CM

## 2015-12-15 DIAGNOSIS — R05 Cough: Secondary | ICD-10-CM

## 2015-12-15 DIAGNOSIS — J111 Influenza due to unidentified influenza virus with other respiratory manifestations: Secondary | ICD-10-CM | POA: Diagnosis not present

## 2015-12-15 MED ORDER — PREDNISONE 20 MG PO TABS: 40.0000 mg | ORAL_TABLET | Freq: Every day | ORAL | Status: DC

## 2015-12-15 NOTE — Assessment & Plan Note (Signed)
Patient with persistent left knee pain after having her foot go through porch floor. Notes the floor was about up to her knee when she hit the ground. She does have mild overlying numbness in a small distribution where her pain is. She is otherwise neurologically intact. Given persistence of discomfort we will obtain x-rays of her knee and tibia and fibula on the left side. She will continue to ice the area. She will continue to monitor. If not improving she will follow-up with her PCP. Given return precautions.

## 2015-12-15 NOTE — Progress Notes (Signed)
Pre visit review using our clinic review tool, if applicable. No additional management support is needed unless otherwise documented below in the visit note. 

## 2015-12-15 NOTE — Progress Notes (Signed)
Patient ID: Lisa Curry, female   DOB: 11-18-1970, 45 y.o.   MRN: [id number]  Tommi Rumps, MD Phone: [phone]  Lisa Curry is a 45 y.o. female who presents today for same-day visit.  Patient notes she was previously seen and evaluated and diagnosed with influenza. She was treated with Tamiflu. Also treated with Tessalon. Notes overall she feels improved though her cough has continued. She notes an achy sensation in her central chest when she coughs or laughs. She possibly notes some wheezing and states it sounds like Rice Krispies sometimes. No exertional chest pain. No shortness of breath. No diaphoresis. No radiation of the chest achiness. No history of blood clot. No unilateral calf swelling. No fevers. She feels improved overall.  Patient additionally notes earlier this month she had her leg go through a deck and noted pain in the medial aspect of the distal aspect of her left knee since that time. She notes the area overlying the pain is numb as well. She notes mild swelling in this area. It occasionally hurts. It is nontender. There is no skin changes. No other numbness in her leg. No weakness.  PMH: nonsmoker.   ROS the history of present illness  Objective  Physical Exam Filed Vitals:   12/15/15 0922  BP: 126/72  Pulse: 93  Temp: 98.3 F (36.8 C)    BP Readings from Last 3 Encounters:  12/15/15 126/72  11/30/15 122/76  08/21/15 128/49   Wt Readings from Last 3 Encounters:  12/15/15 273 lb 9.6 oz (124.104 kg)  11/30/15 267 lb (121.11 kg)  08/21/15 250 lb (113.399 kg)    Physical Exam  Constitutional: She is well-developed, well-nourished, and in no distress.  HENT:  Head: Normocephalic and atraumatic.  Right Ear: External ear normal.  Left Ear: External ear normal.  Mouth/Throat: Oropharynx is clear and moist. No oropharyngeal exudate.  Eyes: Conjunctivae are normal. Pupils are equal, round, and reactive to light.  Neck: Neck supple.  Cardiovascular:  Normal rate, regular rhythm and normal heart sounds.  Exam reveals no gallop and no friction rub.   No murmur heard. Pulmonary/Chest: Effort normal and breath sounds normal. No respiratory distress. She has no wheezes. She has no rales.  Musculoskeletal:       Legs: Left knee with distal inferior portion with mild swelling, no tenderness, no erythema, no warmth, no joint line tenderness, no ligament laxity, negative McMurray's, area overlying the swelling is decreased in sensation to light touch, right knee with no tenderness, swelling, or ligamentus laxity, negative McMurray's  Lymphadenopathy:    She has no cervical adenopathy.  Neurological: She is alert. Gait normal.  5 out of 5 strength bilateral quads, hamstrings, plantar flexion, and dorsiflexion, sensation to light touch intact bilaterally lower extremities with the exception of the area overlying the swelling as outlined in the picture  Skin: Skin is warm and dry.  Left calf 46 cm, right calf 47 cm   Assessment/Plan: Please see individual problem list.  Influenza with respiratory manifestation Patient is status post treatment for influenza A. Overall she feels improved though has continued to cough and has an achy sensation in her chest with cough. Her vital signs are stable. Atypical and unlikely cardiac. With stable vital signs unlikely related VTE. Suspect achiness is musculoskeletal related to the cough. We will obtain a chest x-ray to rule out additional pulmonary causes. Given persistence we will trial prednisone. She'll continue Tessalon. She is given return precautions.  Left knee pain Patient with  persistent left knee pain after having her foot go through porch floor. Notes the floor was about up to her knee when she hit the ground. She does have mild overlying numbness in a small distribution where her pain is. She is otherwise neurologically intact. Given persistence of discomfort we will obtain x-rays of her knee and tibia  and fibula on the left side. She will continue to ice the area. She will continue to monitor. If not improving she will follow-up with her PCP. Given return precautions.    Orders Placed This Encounter  Procedures  . DG Chest 2 View    Standing Status: Future     Number of Occurrences: 1     Standing Expiration Date: 02/13/2017    Order Specific Question:  Reason for Exam (SYMPTOM  OR DIAGNOSIS REQUIRED)    Answer:  cough for several weeks following influenza, achey chest discomfort with cough    Order Specific Question:  Is the patient pregnant?    Answer:  No    Order Specific Question:  Preferred imaging location?    Answer:  [facility]  . DG Knee Complete 4 Views Left    Standing Status: Future     Number of Occurrences: 1     Standing Expiration Date: 02/13/2017    Order Specific Question:  Reason for Exam (SYMPTOM  OR DIAGNOSIS REQUIRED)    Answer:  left knee pain and focal medial left knee numbness    Order Specific Question:  Is the patient pregnant?    Answer:  No    Order Specific Question:  Preferred imaging location?    Answer:  [place] Tibia/Fibula Left    Standing Status: Future     Number of Occurrences: 1     Standing Expiration Date: 02/13/2017    Order Specific Question:  Reason for Exam (SYMPTOM  OR DIAGNOSIS REQUIRED)    Answer:  left knee pain, TTP left tibia    Order Specific Question:  Is the patient pregnant?    Answer:  No    Order Specific Question:  Preferred imaging location?    Answer:  [facility] ordered this encounter  Medications  . predniSONE (DELTASONE) 20 MG tablet    Sig: Take 2 tablets (40 mg total) by mouth daily with breakfast.    Dispense:  10 tablet    Refill:  0   Tommi Rumps, MD [place]

## 2015-12-15 NOTE — Assessment & Plan Note (Signed)
Patient is status post treatment for influenza A. Overall she feels improved though has continued to cough and has an achy sensation in her chest with cough. Her vital signs are stable. Atypical and unlikely cardiac. With stable vital signs unlikely related VTE. Suspect achiness is musculoskeletal related to the cough. We will obtain a chest x-ray to rule out additional pulmonary causes. Given persistence we will trial prednisone. She'll continue Tessalon. She is given return precautions.

## 2015-12-15 NOTE — Patient Instructions (Signed)
Nice to meet you. Please review the chest x-ray to evaluate further. I have provided with a prescription for prednisone to take if your symptoms do not continue to improve. Please continue the Tessalon. Please continue to monitor your left leg. You can ice the area. If it worsens or does not improve you should consider further evaluation. If you develop chest pain, shortness of breath, cough productive of blood, fevers, or any new or changing symptoms please seek medical attention.

## 2015-12-24 ENCOUNTER — Ambulatory Visit (INDEPENDENT_AMBULATORY_CARE_PROVIDER_SITE_OTHER): Payer: BLUE CROSS/BLUE SHIELD | Admitting: Psychology

## 2015-12-24 DIAGNOSIS — F332 Major depressive disorder, recurrent severe without psychotic features: Secondary | ICD-10-CM

## 2016-01-13 ENCOUNTER — Other Ambulatory Visit: Payer: Self-pay | Admitting: *Deleted

## 2016-01-13 MED ORDER — VENLAFAXINE HCL ER 75 MG PO CP24: 225.0000 mg | ORAL_CAPSULE | Freq: Every day | ORAL | Status: DC

## 2016-01-19 ENCOUNTER — Other Ambulatory Visit (INDEPENDENT_AMBULATORY_CARE_PROVIDER_SITE_OTHER): Payer: BLUE CROSS/BLUE SHIELD

## 2016-01-19 ENCOUNTER — Other Ambulatory Visit: Payer: Self-pay | Admitting: Family Medicine

## 2016-01-19 DIAGNOSIS — IMO0001 Reserved for inherently not codable concepts without codable children: Secondary | ICD-10-CM

## 2016-01-19 DIAGNOSIS — E559 Vitamin D deficiency, unspecified: Secondary | ICD-10-CM

## 2016-01-19 DIAGNOSIS — E1165 Type 2 diabetes mellitus with hyperglycemia: Principal | ICD-10-CM

## 2016-01-19 DIAGNOSIS — E785 Hyperlipidemia, unspecified: Secondary | ICD-10-CM | POA: Diagnosis not present

## 2016-01-19 LAB — BASIC METABOLIC PANEL
BUN: 7 mg/dL (ref 6–23)
CO2: 26 mEq/L (ref 19–32)
Calcium: 9 mg/dL (ref 8.4–10.5)
Chloride: 100 mEq/L (ref 96–112)
Creatinine, Ser: 0.72 mg/dL (ref 0.40–1.20)
GFR: 92.78 mL/min (ref >60.00)
Glucose, Bld: 164 mg/dL — ABNORMAL HIGH (ref 70–99)
Potassium: 3.7 mEq/L (ref 3.5–5.1)
Sodium: 137 mEq/L (ref 135–145)

## 2016-01-19 LAB — HEMOGLOBIN A1C: Hgb A1c MFr Bld: 7.9 % — ABNORMAL HIGH (ref 4.6–6.5)

## 2016-01-19 LAB — LIPID PANEL
Cholesterol: 185 mg/dL (ref 0–200)
HDL: 47.4 mg/dL (ref >39.00)
LDL Cholesterol: 107 mg/dL — ABNORMAL HIGH (ref 0–99)
NonHDL: 138.09
Total CHOL/HDL Ratio: 4
Triglycerides: 156 mg/dL — ABNORMAL HIGH (ref 0.0–149.0)
VLDL: 31.2 mg/dL (ref 0.0–40.0)

## 2016-01-19 LAB — VITAMIN D 25 HYDROXY (VIT D DEFICIENCY, FRACTURES): VITD: 18.19 ng/mL — ABNORMAL LOW (ref 30.00–100.00)

## 2016-01-21 ENCOUNTER — Telehealth: Payer: Self-pay

## 2016-01-21 ENCOUNTER — Other Ambulatory Visit: Payer: Self-pay

## 2016-01-21 NOTE — Telephone Encounter (Signed)
PA completed via telephone and approved.

## 2016-01-21 NOTE — Telephone Encounter (Signed)
Thayer Jew with [employer] left v/m requesting prior auth for venlafaxine taking 3 pills daily called to [phone].

## 2016-01-26 ENCOUNTER — Encounter: Payer: Self-pay | Admitting: Family Medicine

## 2016-01-26 ENCOUNTER — Ambulatory Visit (INDEPENDENT_AMBULATORY_CARE_PROVIDER_SITE_OTHER): Payer: BLUE CROSS/BLUE SHIELD | Admitting: Family Medicine

## 2016-01-26 VITALS — BP 122/82 | HR 96 | Temp 98.1°F | Ht 64.0 in | Wt 274.2 lb

## 2016-01-26 DIAGNOSIS — E559 Vitamin D deficiency, unspecified: Secondary | ICD-10-CM

## 2016-01-26 DIAGNOSIS — K219 Gastro-esophageal reflux disease without esophagitis: Secondary | ICD-10-CM

## 2016-01-26 DIAGNOSIS — Z0000 Encounter for general adult medical examination without abnormal findings: Secondary | ICD-10-CM | POA: Diagnosis not present

## 2016-01-26 DIAGNOSIS — E785 Hyperlipidemia, unspecified: Secondary | ICD-10-CM

## 2016-01-26 DIAGNOSIS — E1165 Type 2 diabetes mellitus with hyperglycemia: Secondary | ICD-10-CM | POA: Diagnosis not present

## 2016-01-26 DIAGNOSIS — IMO0001 Reserved for inherently not codable concepts without codable children: Secondary | ICD-10-CM

## 2016-01-26 LAB — MICROALBUMIN / CREATININE URINE RATIO
Creatinine,U: 250.3 mg/dL
Microalb Creat Ratio: 2 mg/g (ref 0.0–30.0)
Microalb, Ur: 5.1 mg/dL — ABNORMAL HIGH (ref 0.0–1.9)

## 2016-01-26 MED ORDER — METFORMIN HCL ER 500 MG PO TB24: 500.0000 mg | ORAL_TABLET | Freq: Every day | ORAL | Status: DC

## 2016-01-26 MED ORDER — LIRAGLUTIDE 18 MG/3ML ~~LOC~~ SOPN: 0.6000 mg | PEN_INJECTOR | Freq: Every day | SUBCUTANEOUS | Status: DC

## 2016-01-26 NOTE — Progress Notes (Signed)
Pre visit review using our clinic review tool, if applicable. No additional management support is needed unless otherwise documented below in the visit note. 

## 2016-01-26 NOTE — Assessment & Plan Note (Signed)
Weight gain noted. Discussed healthy diet and lifestyle changes to affect sustainable weight loss.  

## 2016-01-26 NOTE — Assessment & Plan Note (Signed)
Deteriorated control noted. Discussed options - rec either add metformin XL 500mg  daily (trial in h/o diarrhea to IR) or add victoza instead of tradjenta. Pt will price both out and let me know which she decides to pursue. See pt instructions for plan. RTC 3 mo f/u visit.

## 2016-01-26 NOTE — Assessment & Plan Note (Signed)
Preventative protocols reviewed and updated unless pt declined. Discussed healthy diet and lifestyle.  

## 2016-01-26 NOTE — Patient Instructions (Addendum)
Call to schedule mammogram at breast center as you're due. Sign release for records of endoscopy at [facility] [address]) 2011 Hearing screening today  Either: 1. Continue tradjenta and glimepiride. Add metformin XR 534m once daily and update me with effect. OR 2. Stop tradjenta. Continue glimepiride. Add victoza shot once daily.  Both medicines sent in to price out - then call me with plan.  Health Maintenance, Female Adopting a healthy lifestyle and getting preventive care can go a long way to promote health and wellness. Talk with your health care provider about what schedule of regular examinations is right for you. This is a good chance for you to check in with your provider about disease prevention and staying healthy. In between checkups, there are plenty of things you can do on your own. Experts have done a lot of research about which lifestyle changes and preventive measures are most likely to keep you healthy. Ask your health care provider for more information. WEIGHT AND DIET  Eat a healthy diet  Be sure to include plenty of vegetables, fruits, low-fat dairy products, and lean protein.  Do not eat a lot of foods high in solid fats, added sugars, or salt.  Get regular exercise. This is one of the most important things you can do for your health.  Most adults should exercise for at least 150 minutes each week. The exercise should increase your heart rate and make you sweat (moderate-intensity exercise).  Most adults should also do strengthening exercises at least twice a week. This is in addition to the moderate-intensity exercise.  Maintain a healthy weight  Body mass index (BMI) is a measurement that can be used to identify possible weight problems. It estimates body fat based on height and weight. Your health care provider can help determine your BMI and help you achieve or maintain a healthy weight.  For females 21years of age and older:   A BMI below 18.5 is  considered underweight.  A BMI of 18.5 to 24.9 is normal.  A BMI of 25 to 29.9 is considered overweight.  A BMI of 30 and above is considered obese.  Watch levels of cholesterol and blood lipids  You should start having your blood tested for lipids and cholesterol at 46years of age, then have this test every 5 years.  You may need to have your cholesterol levels checked more often if:  Your lipid or cholesterol levels are high.  You are older than 46years of age.  You are at high risk for heart disease.  CANCER SCREENING   Lung Cancer  Lung cancer screening is recommended for adults [id number] old who are at high risk for lung cancer because of a history of smoking.  A yearly low-dose CT scan of the lungs is recommended for people who:  Currently smoke.  Have quit within the past 15 years.  Have at least a 30-pack-year history of smoking. A pack year is smoking an average of one pack of cigarettes a day for 1 year.  Yearly screening should continue until it has been 15 years since you quit.  Yearly screening should stop if you develop a health problem that would prevent you from having lung cancer treatment.  Breast Cancer  Practice breast self-awareness. This means understanding how your breasts normally appear and feel.  It also means doing regular breast self-exams. Let your health care provider know about any changes, no matter how small.  If you are in your  56s or 42s, you should have a clinical breast exam (CBE) by a health care provider every 1-3 years as part of a regular health exam.  If you are 68 or older, have a CBE every year. Also consider having a breast X-ray (mammogram) every year.  If you have a family history of breast cancer, talk to your health care provider about genetic screening.  If you are at high risk for breast cancer, talk to your health care provider about having an MRI and a mammogram every year.  Breast cancer gene (BRCA)  assessment is recommended for women who have family members with BRCA-related cancers. BRCA-related cancers include:  Breast.  Ovarian.  Tubal.  Peritoneal cancers.  Results of the assessment will determine the need for genetic counseling and BRCA1 and BRCA2 testing. Cervical Cancer Your health care provider may recommend that you be screened regularly for cancer of the pelvic organs (ovaries, uterus, and vagina). This screening involves a pelvic examination, including checking for microscopic changes to the surface of your cervix (Pap test). You may be encouraged to have this screening done every 3 years, beginning at age 62.  For women ages 86-65, health care providers may recommend pelvic exams and Pap testing every 3 years, or they may recommend the Pap and pelvic exam, combined with testing for human papilloma virus (HPV), every 5 years. Some types of HPV increase your risk of cervical cancer. Testing for HPV may also be done on women of any age with unclear Pap test results.  Other health care providers may not recommend any screening for nonpregnant women who are considered low risk for pelvic cancer and who do not have symptoms. Ask your health care provider if a screening pelvic exam is right for you.  If you have had past treatment for cervical cancer or a condition that could lead to cancer, you need Pap tests and screening for cancer for at least 20 years after your treatment. If Pap tests have been discontinued, your risk factors (such as having a new sexual partner) need to be reassessed to determine if screening should resume. Some women have medical problems that increase the chance of getting cervical cancer. In these cases, your health care provider may recommend more frequent screening and Pap tests. Colorectal Cancer  This type of cancer can be detected and often prevented.  Routine colorectal cancer screening usually begins at 46 years of age and continues through 46 years  of age.  Your health care provider may recommend screening at an earlier age if you have risk factors for colon cancer.  Your health care provider may also recommend using home test kits to check for hidden blood in the stool.  A small camera at the end of a tube can be used to examine your colon directly (sigmoidoscopy or colonoscopy). This is done to check for the earliest forms of colorectal cancer.  Routine screening usually begins at age 25.  Direct examination of the colon should be repeated every 5-10 years through 46 years of age. However, you may need to be screened more often if early forms of precancerous polyps or small growths are found. Skin Cancer  Check your skin from head to toe regularly.  Tell your health care provider about any new moles or changes in moles, especially if there is a change in a mole's shape or color.  Also tell your health care provider if you have a mole that is larger than the size of a pencil eraser.  Always use sunscreen. Apply sunscreen liberally and repeatedly throughout the day.  Protect yourself by wearing long sleeves, pants, a wide-brimmed hat, and sunglasses whenever you are outside. HEART DISEASE, DIABETES, AND HIGH BLOOD PRESSURE   High blood pressure causes heart disease and increases the risk of stroke. High blood pressure is more likely to develop in:  People who have blood pressure in the high end of the normal range (130-139/85-89 mm Hg).  People who are overweight or obese.  People who are African American.  If you are 62-16 years of age, have your blood pressure checked every 3-5 years. If you are 46 years of age or older, have your blood pressure checked every year. You should have your blood pressure measured twice--once when you are at a hospital or clinic, and once when you are not at a hospital or clinic. Record the average of the two measurements. To check your blood pressure when you are not at a hospital or clinic, you  can use:  An automated blood pressure machine at a pharmacy.  A home blood pressure monitor.  If you are between 54 years and 10 years old, ask your health care provider if you should take aspirin to prevent strokes.  Have regular diabetes screenings. This involves taking a blood sample to check your fasting blood sugar level.  If you are at a normal weight and have a low risk for diabetes, have this test once every three years after 46 years of age.  If you are overweight and have a high risk for diabetes, consider being tested at a younger age or more often. PREVENTING INFECTION  Hepatitis B  If you have a higher risk for hepatitis B, you should be screened for this virus. You are considered at high risk for hepatitis B if:  You were born in a country where hepatitis B is common. Ask your health care provider which countries are considered high risk.  Your parents were born in a high-risk country, and you have not been immunized against hepatitis B (hepatitis B vaccine).  You have HIV or AIDS.  You use needles to inject street drugs.  You live with someone who has hepatitis B.  You have had sex with someone who has hepatitis B.  You get hemodialysis treatment.  You take certain medicines for conditions, including cancer, organ transplantation, and autoimmune conditions. Hepatitis C  Blood testing is recommended for:  Everyone born from [birth year] through 1966.  Anyone with known risk factors for hepatitis C. Sexually transmitted infections (STIs)  You should be screened for sexually transmitted infections (STIs) including gonorrhea and chlamydia if:  You are sexually active and are younger than 46 years of age.  You are older than 46 years of age and your health care provider tells you that you are at risk for this type of infection.  Your sexual activity has changed since you were last screened and you are at an increased risk for chlamydia or gonorrhea. Ask your health  care provider if you are at risk.  If you do not have HIV, but are at risk, it may be recommended that you take a prescription medicine daily to prevent HIV infection. This is called pre-exposure prophylaxis (PrEP). You are considered at risk if:  You are sexually active and do not regularly use condoms or know the HIV status of your partner(s).  You take drugs by injection.  You are sexually active with a partner who has HIV. Talk with your health  care provider about whether you are at high risk of being infected with HIV. If you choose to begin PrEP, you should first be tested for HIV. You should then be tested every 3 months for as long as you are taking PrEP.  PREGNANCY   If you are premenopausal and you may become pregnant, ask your health care provider about preconception counseling.  If you may become pregnant, take 400 to 800 micrograms (mcg) of folic acid every day.  If you want to prevent pregnancy, talk to your health care provider about birth control (contraception). OSTEOPOROSIS AND MENOPAUSE   Osteoporosis is a disease in which the bones lose minerals and strength with aging. This can result in serious bone fractures. Your risk for osteoporosis can be identified using a bone density scan.  If you are 32 years of age or older, or if you are at risk for osteoporosis and fractures, ask your health care provider if you should be screened.  Ask your health care provider whether you should take a calcium or vitamin D supplement to lower your risk for osteoporosis.  Menopause may have certain physical symptoms and risks.  Hormone replacement therapy may reduce some of these symptoms and risks. Talk to your health care provider about whether hormone replacement therapy is right for you.  HOME CARE INSTRUCTIONS   Schedule regular health, dental, and eye exams.  Stay current with your immunizations.   Do not use any tobacco products including cigarettes, chewing tobacco, or  electronic cigarettes.  If you are pregnant, do not drink alcohol.  If you are breastfeeding, limit how much and how often you drink alcohol.  Limit alcohol intake to no more than 1 drink per day for nonpregnant women. One drink equals 12 ounces of beer, 5 ounces of wine, or 1 ounces of hard liquor.  Do not use street drugs.  Do not share needles.  Ask your health care provider for help if you need support or information about quitting drugs.  Tell your health care provider if you often feel depressed.  Tell your health care provider if you have ever been abused or do not feel safe at home.   This information is not intended to replace advice given to you by your health care provider. Make sure you discuss any questions you have with your health care provider.   Document Released: 03/21/2011 Document Revised: 09/26/2014 Document Reviewed: 08/07/2013 Elsevier Interactive Patient Education [employer].

## 2016-01-26 NOTE — Assessment & Plan Note (Signed)
?  h/o barrett's esophagus. Never received endoscopy records. Will again request EGD records from [place] [facility].  Pt states this is well controlled with dexilant daily.

## 2016-01-26 NOTE — Progress Notes (Signed)
BP 122/82 mmHg  Pulse 96  Temp(Src) 98.1 F (36.7 C) (Oral)  Ht 5\' 4"  (1.626 m)  Wt 274 lb 4 oz (124.399 kg)  BMI 47.05 kg/m2  LMP 01/23/2016   CC: CPE  Subjective:    Patient ID: Lisa Curry, female    DOB: 1971-02-17, 45 y.o.   MRN: [id number]  HPI: Lisa Curry is a 45 y.o. female presenting on 01/26/2016 for Annual Exam   08/2015 - crush injury to R hand at work (window vs hand). Evaluated at ER - no broken bones. S/p MRI. Out of work since [employer].   11/2015 - stepped through hole in porch with R leg pain. Some numbness of skin medial to knee.  DM - uncontrolled with diet changes.   Notes more trouble with mood around cycle - more emotional lability as well. Compliant with wellbutrin 75mg  BID, klonopin 0.5mg  BID PRN, and effexor XR 225mg  daily.   Asks about "shot to get put into menopause."   Preventative: LMP 01/23/2016 Pap Smear: 2012, 2015 (absent transformation zone), 2016 WNL.  Mammogram: 12/2013 normal, would like Q2 yrs. Flu: done Pneumovax 2015 Tetanus: >10 yrs - declines.  Seat belt use discussed Sunscreen use discussed. No changing moles on skin.   Eye Doctor: QO year.  Dentist: biannually  Lives with husband and 2 children, 3 dogs, 2 cats and 2 duck Occ: special ed substitute Pharmacist, hospital. Activity: no regular exercise Diet: good water, fruits/vegetables daily  Relevant past medical, surgical, family and social history reviewed and updated as indicated. Interim medical history since our last visit reviewed. Allergies and medications reviewed and updated. Current Outpatient Prescriptions on File Prior to Visit  Medication Sig  . buPROPion (WELLBUTRIN) 75 MG tablet Take 1 tablet (75 mg total) by mouth 3 (three) times daily. (Patient taking differently: Take 75 mg by mouth 2 (two) times daily. )  . Cholecalciferol (VITAMIN D3) 1000 UNITS CAPS Take 1 capsule (1,000 Units total) by mouth daily.  . clonazePAM (KLONOPIN) 0.5 MG tablet TAKE 1 TABLET BY MOUTH  DAILY MAY TAKE ADDITIONAL 1 TABLET DAILY AS NEEDED  . DEXILANT 60 MG capsule TAKE 1 CAPSULE (60 MG TOTAL) BY MOUTH DAILY.  Marland Kitchen glimepiride (AMARYL) 4 MG tablet TAKE 2 TABLETS (8 MG TOTAL) BY MOUTH DAILY WITH BREAKFAST.  Marland Kitchen glucose blood test strip 1 each by Other route 2 (two) times daily as needed for other. Use to check sugar twice daily and as needed. Dx:E11.65  . TRADJENTA 5 MG TABS tablet TAKE 1 TABLET (5 MG TOTAL) BY MOUTH DAILY.  Marland Kitchen venlafaxine XR (EFFEXOR-XR) 75 MG 24 hr capsule Take 3 capsules (225 mg total) by mouth daily with breakfast. Take three capsules (225 mg) every morning.   No current facility-administered medications on file prior to visit.    Review of Systems  Constitutional: Negative for fever, chills, activity change, appetite change, fatigue and unexpected weight change.  HENT: Negative for hearing loss.   Eyes: Negative for visual disturbance.  Respiratory: Negative for cough, chest tightness, shortness of breath and wheezing.   Cardiovascular: Negative for chest pain, palpitations and leg swelling.  Gastrointestinal: Negative for nausea, vomiting, abdominal pain, diarrhea, constipation, blood in stool and abdominal distention.  Genitourinary: Negative for hematuria and difficulty urinating.  Musculoskeletal: Negative for myalgias, arthralgias and neck pain.  Skin: Negative for rash.  Neurological: Negative for dizziness, seizures, syncope and headaches.  Hematological: Negative for adenopathy. Does not bruise/bleed easily.  Psychiatric/Behavioral: Negative for dysphoric mood. The patient  is not nervous/anxious.    Per HPI unless specifically indicated in ROS section     Objective:    BP 122/82 mmHg  Pulse 96  Temp(Src) 98.1 F (36.7 C) (Oral)  Ht 5\' 4"  (1.626 m)  Wt 274 lb 4 oz (124.399 kg)  BMI 47.05 kg/m2  LMP 01/23/2016  Wt Readings from Last 3 Encounters:  01/26/16 274 lb 4 oz (124.399 kg)  12/15/15 273 lb 9.6 oz (124.104 kg)  11/30/15 267 lb (121.11  kg)    Physical Exam  Constitutional: She is oriented to person, place, and time. She appears well-developed and well-nourished. No distress.  HENT:  Head: Normocephalic and atraumatic.  Right Ear: Hearing, tympanic membrane, external ear and ear canal normal.  Left Ear: Hearing, tympanic membrane, external ear and ear canal normal.  Nose: Nose normal.  Mouth/Throat: Uvula is midline, oropharynx is clear and moist and mucous membranes are normal. No oropharyngeal exudate, posterior oropharyngeal edema or posterior oropharyngeal erythema.  Eyes: Conjunctivae and EOM are normal. Pupils are equal, round, and reactive to light. No scleral icterus.  Neck: Normal range of motion. Neck supple. No thyromegaly present.  Cardiovascular: Normal rate, regular rhythm, normal heart sounds and intact distal pulses.   No murmur heard. Pulses:      Radial pulses are 2+ on the right side, and 2+ on the left side.  Pulmonary/Chest: Effort normal and breath sounds normal. No respiratory distress. She has no wheezes. She has no rales.  Abdominal: Soft. Bowel sounds are normal. She exhibits no distension and no mass. There is no tenderness. There is no rebound and no guarding.  Musculoskeletal: Normal range of motion. She exhibits no edema.  Lymphadenopathy:    She has no cervical adenopathy.  Neurological: She is alert and oriented to person, place, and time.  CN grossly intact, station and gait intact  Skin: Skin is warm and dry. No rash noted.  Psychiatric: She has a normal mood and affect. Her behavior is normal. Judgment and thought content normal.  Nursing note and vitals reviewed.  Results for orders placed or performed in visit on 01/19/16  Lipid panel  Result Value Ref Range   Cholesterol 185 0 - 200 mg/dL   Triglycerides 156.0 (H) 0.0 - 149.0 mg/dL   HDL 47.40 >39.00 mg/dL   VLDL 31.2 0.0 - 40.0 mg/dL   LDL Cholesterol 107 (H) 0 - 99 mg/dL   Total CHOL/HDL Ratio 4    NonHDL 138.09     Hemoglobin A1c  Result Value Ref Range   Hgb A1c MFr Bld 7.9 (H) 4.6 - 6.5 %  Basic metabolic panel  Result Value Ref Range   Sodium 137 135 - 145 mEq/L   Potassium 3.7 3.5 - 5.1 mEq/L   Chloride 100 96 - 112 mEq/L   CO2 26 19 - 32 mEq/L   Glucose, Bld 164 (H) 70 - 99 mg/dL   BUN 7 6 - 23 mg/dL   Creatinine, Ser 0.72 0.40 - 1.20 mg/dL   Calcium 9.0 8.4 - 10.5 mg/dL   GFR 92.78 >60.00 mL/min  VITAMIN D 25 Hydroxy (Vit-D Deficiency, Fractures)  Result Value Ref Range   VITD 18.19 (L) 30.00 - 100.00 ng/mL      Assessment & Plan:   Problem List Items Addressed This Visit    Severe obesity (BMI >= 40) (HCC)    Weight gain noted. Discussed healthy diet and lifestyle changes to affect sustainable weight loss      Relevant Medications  metFORMIN (GLUCOPHAGE XR) 500 MG 24 hr tablet   Liraglutide 18 MG/3ML SOPN   GERD (gastroesophageal reflux disease)    ?h/o barrett's esophagus. Never received endoscopy records. Will again request EGD records from [place] [facility].  Pt states this is well controlled with dexilant daily.      Diabetes mellitus type 2, uncontrolled ([place])    Deteriorated control noted. Discussed options - rec either add metformin XL 500mg  daily (trial in h/o diarrhea to IR) or add victoza instead of tradjenta. Pt will price both out and let me know which she decides to pursue. See pt instructions for plan. RTC 3 mo f/u visit.      Relevant Medications   metFORMIN (GLUCOPHAGE XR) 500 MG 24 hr tablet   Liraglutide 18 MG/3ML SOPN   Health maintenance examination - Primary    Preventative protocols reviewed and updated unless pt declined. Discussed healthy diet and lifestyle.       Vitamin D deficiency    Encouraged restart vit D 1000-2000 IU daily.      HLD (hyperlipidemia)    Reviewed FLP. Pt hesitant for statin. Advised trial RYR 1-2 tab daily.           Follow up plan: Return in about 3 months (around 04/27/2016), or as needed, for follow up  visit.  Ria Bush, MD

## 2016-01-26 NOTE — Assessment & Plan Note (Signed)
Reviewed FLP. Pt hesitant for statin. Advised trial RYR 1-2 tab daily.

## 2016-01-26 NOTE — Assessment & Plan Note (Signed)
Encouraged restart vit D 1000-2000 IU daily.

## 2016-01-28 ENCOUNTER — Other Ambulatory Visit: Payer: Self-pay | Admitting: *Deleted

## 2016-01-28 MED ORDER — INSULIN PEN NEEDLE 29G X 12.7MM MISC: Status: DC

## 2016-01-29 ENCOUNTER — Encounter: Payer: Self-pay | Admitting: *Deleted

## 2016-02-02 ENCOUNTER — Other Ambulatory Visit: Payer: Self-pay

## 2016-02-02 NOTE — Telephone Encounter (Signed)
Pt was seen 01/26/16; pt started red yeast rice last week; last time taken was 01/31/16 in PM. Pt was taking 1200 mg. On 02/01/16  FBS was 256; pt is trying to find out how many carbs are in red yeast rice. Today FBS was 156; pt feels fine. Pt is presently taking tradjenta,glimeperide and metformin. Pt wants to know if should split dose of red yeast rice and take 1 pill bid. Pt also request refill clonazepam to [facility]. Last filled # 60 on 12/04/15.  Pt request cb.

## 2016-02-03 ENCOUNTER — Telehealth: Payer: Self-pay | Admitting: Family Medicine

## 2016-02-03 MED ORDER — CLONAZEPAM 0.5 MG PO TABS: ORAL_TABLET | ORAL | Status: DC

## 2016-02-03 NOTE — Telephone Encounter (Signed)
Continue red yeast rice. Metformin is long acting med - will not note effect on sugar control for several weeks. Would continue metformin for next few days to see if diarrhea resolves (usually does). If not better would stop, update me and we can try extended release metformin.  Increase water intake. Update me later this week with effect.

## 2016-02-03 NOTE — Telephone Encounter (Signed)
Pt upset that when she requested to speak to CMA Kim D on 02/02/16, she was transferred to triage nurse.  Explained that because the call was concerning medications, the scheduler had routed the call correctly.  Pt expressed concern that "system is not working when I want to speak to [facility] or Dr. Darnell Level.".  Pt continued to express displeasure at inability to get appointments with her PCP and/or speak to the person she wanted to at that time.  After lengthy conversation, I assured pt that she would receive clinical call back to address her initial concerns about the red yeast rice and her clonazepam refill.  CMA Aricelli was not able to complete the instructions from PCP in previous phone call.  Best number to call pt is [phone]

## 2016-02-03 NOTE — Telephone Encounter (Signed)
Called into [facility]

## 2016-02-03 NOTE — Telephone Encounter (Signed)
Patient Name: Lisa Curry  DOB: [birth year]    Initial Comment Caller states his mother started Metformin but blood sugar still running high in 200s, not feeling well   Nurse Assessment  Nurse: Raphael Gibney, RN, Vanita Ingles Date/Time (Eastern Time): 02/03/2016 12:31:12 PM  Confirm and document reason for call. If symptomatic, describe symptoms. You must click the next button to save text entered. ---Caller states mother started on metformin 500 mg qd to help decrease her blood sugar amounts. Blood sugar was 186 this am and now is 218. Started metformin Saturday night. She feels tired and weak. Fasting blood sugar on Monday was 256. She took the medication that doctor ordered for her cholesterol on Sunday night which elevated her blood sugar on Monday am. did not take it Monday night and her fasting blood sugar was 156 on tuesday. She is having diarrhea also.  Has the patient traveled out of the country within the last 30 days? ---Not Applicable  Does the patient have any new or worsening symptoms? ---Yes  Will a triage be completed? ---Yes  Related visit to physician within the last 2 weeks? ---Yes  Does the PT have any chronic conditions? (i.e. diabetes, asthma, etc.) ---Yes  List chronic conditions. ---type 2 diabetes;  Is the patient pregnant or possibly pregnant? (Ask all females between the ages of 72-55) ---No  Is this a behavioral health or substance abuse call? ---No     Guidelines    Guideline Title Affirmed Question Affirmed Notes  Diabetes - High Blood Sugar Caller has NON-URGENT medication question about med that PCP prescribed and triager unable to answer question    Final Disposition User   Call PCP within 63 Hours Stringer, RN, Vanita Ingles    Comments  Pt does not want to make an appt as she was just there last Friday. . Pt wants to know if she continue her metformin due to diarrhea and the fact her blood sugar is not coming down and whether or not to take her cholesterol medication. Please call  pt back regarding medication.   Referrals  GO TO FACILITY REFUSED   Disagree/Comply: Disagree  Disagree/Comply Reason: Disagree with instructions

## 2016-02-03 NOTE — Telephone Encounter (Signed)
See other phone note as well. Recommend red yeast rice once daily to start. I don't think there are carbs in red yeast rice but this class of medicine may increase cbg's by only a few points.  plz phone in clonazepam.

## 2016-02-03 NOTE — Telephone Encounter (Signed)
As I was explaining to the patient of Dr. Synthia Innocent instructions she asked to speak with Maudie Mercury. I explained that Maudie Mercury wasn't available at the moment but I had instructions from Dr. Darnell Level, then she stopped me and demanded to speak to the manager I transferred her to [place].

## 2016-02-04 NOTE — Telephone Encounter (Signed)
Patient notified to take just one RYR and that it could cause readings to increase slightly, but that Dr. Darnell Level wanted her to continue. Also notified to give body a little time to adjust to metformin and to call me if she doesn't start feeling better. She said the diarrhea was already beginning to get better. She advised that she had just picked up a 30 day supply of tradjenta the other day, so she is going to finish that along with the metformin and glimepiride, then she was going to start the glimepiride and victoza.   Apologized for not being available yesterday, but informed her that I was attending a class and assured her that whomever she speaks with will get me the message.

## 2016-02-04 NOTE — Telephone Encounter (Signed)
Noted. Thanks.

## 2016-02-12 ENCOUNTER — Other Ambulatory Visit: Payer: Self-pay | Admitting: Family Medicine

## 2016-03-25 ENCOUNTER — Other Ambulatory Visit: Payer: Self-pay | Admitting: Family Medicine

## 2016-04-07 ENCOUNTER — Ambulatory Visit (INDEPENDENT_AMBULATORY_CARE_PROVIDER_SITE_OTHER): Payer: BLUE CROSS/BLUE SHIELD | Admitting: Family

## 2016-04-07 ENCOUNTER — Encounter: Payer: Self-pay | Admitting: Family

## 2016-04-07 VITALS — BP 134/74 | HR 102 | Temp 98.3°F | Wt 278.5 lb

## 2016-04-07 DIAGNOSIS — R0981 Nasal congestion: Secondary | ICD-10-CM | POA: Diagnosis not present

## 2016-04-07 DIAGNOSIS — R5383 Other fatigue: Secondary | ICD-10-CM

## 2016-04-07 DIAGNOSIS — R21 Rash and other nonspecific skin eruption: Secondary | ICD-10-CM

## 2016-04-07 MED ORDER — NYSTATIN-TRIAMCINOLONE 100000-0.1 UNIT/GM-% EX OINT
1.0000 | TOPICAL_OINTMENT | Freq: Two times a day (BID) | CUTANEOUS | Status: DC
Start: 2016-04-07 — End: 2016-06-03

## 2016-04-07 NOTE — Patient Instructions (Signed)
Pleasure meeting today and so sorry about the circumstance that you're in. If things deteriorate, and you think you would benefit from  counseling, please let our office know.  Wish you the best.  If there is no improvement in your symptoms, or if there is any worsening of symptoms, or if you have any additional concerns, please return for re-evaluation; or, if we are closed, consider going to the Emergency Room for evaluation if symptoms urgent.

## 2016-04-07 NOTE — Progress Notes (Signed)
Pre visit review using our clinic review tool, if applicable. No additional management support is needed unless otherwise documented below in the visit note. 

## 2016-04-07 NOTE — Progress Notes (Signed)
Subjective:    Patient ID: Lisa Curry, female    DOB: 1971/08/12, 45 y.o.   MRN: [id number]   Lisa Curry is a 45 y.o. female who presents today for an acute visit.    HPI Comments: Patient here for evaluation of fatigue and depression. Accompanied by her mother. She describes over the past couple weeks that she's become the primary caregiver for her aging parents in laws. Her mother-in-law has dementia and is quite rude to patient. No other family members are participating in care and patient is quite tearful regarding mother-in-law's treatment of her as well as the burden caring for them. She states that she does all the grocery shopping and has spent in the past couple days over at their house, 5 hours each visit. She denies any thoughts of hurting herself or suicide. In the past she has had counseling although not at this time. She wonders if perhaps she is anemic or could be her thyroid causing the fatigue.  She also complains of nasal congestion, right ear pressure for the past couple days. No cough, fever, chills. She has not tried any medication for relief.  Patient also asks for refill antifungal topical cream. She states that she gets redness and rash in between legs and under breast during the hot summer months. No rash at this time. No fever or chills.  Patient has a history of depression and diabetes, vitamin D deficient. Last A1c 7.9. Last physical exam 2 months ago.  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  Past Medical History  Diagnosis Date  . Depression with anxiety   . Diabetes mellitus without complication ([place])     DSME 04/2014  . Allergy   . GERD (gastroesophageal reflux disease)   . Hyperlipidemia   . Barrett esophagus     [facility], have not received EGD report  . Vitamin D deficiency   . History of Helicobacter pylori infection   . Gastric polyp 05/2012    h/o EGD  . HLD (hyperlipidemia)    Allergies: Biaxin and Metformin and related Current  Outpatient Prescriptions on File Prior to Visit  Medication Sig Dispense Refill  . buPROPion (WELLBUTRIN) 75 MG tablet Take 1 tablet (75 mg total) by mouth 3 (three) times daily. (Patient taking differently: Take 75 mg by mouth 2 (two) times daily. ) 90 tablet 6  . Cholecalciferol (VITAMIN D3) 1000 UNITS CAPS Take 1 capsule (1,000 Units total) by mouth daily. 30 capsule   . clonazePAM (KLONOPIN) 0.5 MG tablet TAKE 1 TABLET BY MOUTH DAILY MAY TAKE ADDITIONAL 1 TABLET DAILY AS NEEDED 60 tablet 1  . DEXILANT 60 MG capsule TAKE 1 CAPSULE (60 MG TOTAL) BY MOUTH DAILY. 30 capsule 11  . glimepiride (AMARYL) 4 MG tablet TAKE 2 TABLETS (8 MG TOTAL) BY MOUTH DAILY WITH BREAKFAST. 60 tablet 11  . glucose blood test strip 1 each by Other route 2 (two) times daily as needed for other. Use to check sugar twice daily and as needed. Dx:E11.65 180 each 3  . ibuprofen (ADVIL,MOTRIN) 200 MG tablet Take 800 mg by mouth as needed.    . Insulin Pen Needle (BD ULTRA-FINE PEN NEEDLES) 29G X 12.7MM MISC Use once daily to inject Victoza **Fill Novafine pen needle or what is compatible with pen 100 each 3  . metFORMIN (GLUCOPHAGE-XR) 500 MG 24 hr tablet TAKE 1 TABLET (500 MG TOTAL) BY MOUTH DAILY WITH BREAKFAST. 30 tablet 6  . TRADJENTA 5 MG TABS tablet  TAKE 1 TABLET (5 MG TOTAL) BY MOUTH DAILY. 30 tablet 6  . venlafaxine XR (EFFEXOR-XR) 75 MG 24 hr capsule Take 3 capsules (225 mg total) by mouth daily with breakfast. Take three capsules (225 mg) every morning. 90 capsule 6   No current facility-administered medications on file prior to visit.    Social History  Substance Use Topics  . Smoking status: Never Smoker   . Smokeless tobacco: Never Used  . Alcohol Use: No    Review of Systems  Constitutional: Positive for fatigue. Negative for fever and chills.  HENT: Positive for congestion and ear pain (right ear pressure). Negative for sore throat.   Respiratory: Negative for cough.   Cardiovascular: Negative for  chest pain and palpitations.  Gastrointestinal: Negative for nausea and vomiting.  Skin: Negative for rash.  Psychiatric/Behavioral: Negative for suicidal ideas, confusion and self-injury.      Objective:    BP 134/74 mmHg  Pulse 102  Temp(Src) 98.3 F (36.8 C) (Oral)  Wt 278 lb 8 oz (126.327 kg)  SpO2 98%  LMP 04/01/2016   Physical Exam  Constitutional: She appears well-developed and well-nourished.  HENT:  Head: Normocephalic and atraumatic.  Right Ear: Hearing, tympanic membrane, external ear and ear canal normal. No drainage, swelling or tenderness. No foreign bodies. Tympanic membrane is not erythematous and not bulging. No middle ear effusion. No decreased hearing is noted.  Left Ear: Hearing, tympanic membrane, external ear and ear canal normal. No drainage, swelling or tenderness. No foreign bodies. Tympanic membrane is not erythematous and not bulging.  No middle ear effusion. No decreased hearing is noted.  Nose: Rhinorrhea present. Right sinus exhibits no maxillary sinus tenderness and no frontal sinus tenderness. Left sinus exhibits no maxillary sinus tenderness and no frontal sinus tenderness.  Mouth/Throat: Uvula is midline, oropharynx is clear and moist and mucous membranes are normal. No oropharyngeal exudate, posterior oropharyngeal edema, posterior oropharyngeal erythema or tonsillar abscesses.  Eyes: Conjunctivae are normal.  Neck: No thyroid mass and no thyromegaly present.  Cardiovascular: Normal rate, regular rhythm, normal heart sounds and normal pulses.   Pulmonary/Chest: Effort normal and breath sounds normal. She has no wheezes. She has no rhonchi. She has no rales.  Lymphadenopathy:       Head (right side): No submental, no submandibular, no tonsillar, no preauricular, no posterior auricular and no occipital adenopathy present.       Head (left side): No submental, no submandibular, no tonsillar, no preauricular, no posterior auricular and no occipital  adenopathy present.    She has no cervical adenopathy.  Neurological: She is alert.  Skin: Skin is warm and dry.  Psychiatric: She has a normal mood and affect. Her speech is normal and behavior is normal. Thought content normal.  Vitals reviewed.      Assessment & Plan:   1. Other fatigue Pending basic lab work. Reassured by normal lab work at physical 2 months ago. As I discussed with patient, likely she has caregiver fatigue which is exacerbating depression. If labs are normal, advised her to maintain close f/u with PCP if decided to forego counseling at this time. Advised patient to conduct family meeting and ask for support from family at this difficult time. Advised to stay on antidepressant medication.   - TSH; Future - CBC with Differential/Platelet; Future - TSH - CBC with Differential/Platelet  2. Rash and nonspecific skin eruption No rash at this time. Agreed to refill.  - nystatin-triamcinolone ointment (MYCOLOG); Apply 1 application topically 2 (  two) times daily.  Dispense: 30 g; Refill: 0  3. Nasal congestion Afebrile. No acute sinus tenderness. Suspect viral etiology. Patient agreed with conservative measures and delayed antibiotic treatment.    I have discontinued Ms. Ralls's Liraglutide. I am also having her maintain her Vitamin D3, buPROPion, glimepiride, DEXILANT, glucose blood, venlafaxine XR, ibuprofen, Insulin Pen Needle, clonazePAM, TRADJENTA, metFORMIN, Red Yeast Rice, and multivitamin.   Meds ordered this encounter  Medications  . Red Yeast Rice 600 MG CAPS    Sig: Take 1 capsule by mouth daily.  . Multiple Vitamin (MULTIVITAMIN) capsule    Sig: Take 1 capsule by mouth daily.    Return precautions given.   Risks, benefits, and alternatives of the medications and treatment plan prescribed today were discussed, and patient expressed understanding.   Education regarding symptom management and diagnosis given to patient on AVS.  Continue to follow with  Ria Bush, MD for routine health maintenance.   Lisa Curry and I agreed with plan.   Mable Paris, FNP  Total of 25 minutes spent with patient, greater than 50% of which was spent in discussion of depression and fatigue.

## 2016-04-08 LAB — CBC WITH DIFFERENTIAL/PLATELET
Basophils Absolute: 0 10*3/uL (ref 0.0–0.1)
Basophils Relative: 0.3 % (ref 0.0–3.0)
Eosinophils Absolute: 0.2 10*3/uL (ref 0.0–0.7)
Eosinophils Relative: 2 % (ref 0.0–5.0)
HCT: 35.6 % — ABNORMAL LOW (ref 36.0–46.0)
Hemoglobin: 11.8 g/dL — ABNORMAL LOW (ref 12.0–15.0)
Lymphocytes Relative: 24.1 % (ref 12.0–46.0)
Lymphs Abs: 2.6 10*3/uL (ref 0.7–4.0)
MCHC: 33.1 g/dL (ref 30.0–36.0)
MCV: 83.7 fl (ref 78.0–100.0)
Monocytes Absolute: 0.3 10*3/uL (ref 0.1–1.0)
Monocytes Relative: 3 % (ref 3.0–12.0)
Neutro Abs: 7.7 10*3/uL (ref 1.4–7.7)
Neutrophils Relative %: 70.6 % (ref 43.0–77.0)
Platelets: 313 10*3/uL (ref 150.0–400.0)
RBC: 4.25 Mil/uL (ref 3.87–5.11)
RDW: 16.4 % — ABNORMAL HIGH (ref 11.5–15.5)
WBC: 10.9 10*3/uL — ABNORMAL HIGH (ref 4.0–10.5)

## 2016-04-08 LAB — TSH: TSH: 1.8 u[IU]/mL (ref 0.35–4.50)

## 2016-04-11 ENCOUNTER — Telehealth: Payer: Self-pay | Admitting: Family

## 2016-04-11 DIAGNOSIS — D649 Anemia, unspecified: Secondary | ICD-10-CM

## 2016-04-11 MED ORDER — FERROUS SULFATE 325 (65 FE) MG PO TBEC: 325.0000 mg | DELAYED_RELEASE_TABLET | Freq: Three times a day (TID) | ORAL | 2 refills | Status: DC

## 2016-04-11 NOTE — Telephone Encounter (Signed)
Left voicemail requesting pt to call office back 

## 2016-04-11 NOTE — Telephone Encounter (Signed)
Please call patient and let them know that thyroid functions normal. However her labs do reflect slight anemia, presumably iron deficiency anemia. We would need to do testing to officially diagnose iron deficiency anemia ( more blood work). Alternately, it may be anemia of chronic disease.   Trial of iron and I have sent to [facility] 8 weeks ; I have put orders to come back and check iron studies ( lab appt only) in 2 months see if improvement.  Please advise f/u appt with PCP regarding depression.   Thanks!  Joycelyn Schmid

## 2016-04-12 ENCOUNTER — Telehealth: Payer: Self-pay | Admitting: Family Medicine

## 2016-04-12 NOTE — Telephone Encounter (Signed)
Pt  Called back she  She called drug store to check on rx Insurance denying because it is over the counter and its  Cost $56 can she take something else can she get something from [facility]?

## 2016-04-12 NOTE — Telephone Encounter (Signed)
Pt notified of lab results and Joycelyn Schmid Arnett's comments. Lab appt and Dr. Synthia Innocent appt scheduled

## 2016-04-12 NOTE — Telephone Encounter (Signed)
See Prev message. Pt is talking about the nystatin-triamcinolone ointment. Mable Paris saw pt and prescribed med per pt request saying her old NP at her old office prescribed it to her because of the heat and her weight she is easily prone to yeast rashs. She is requesting an alt med sent to pharmacy

## 2016-04-13 MED ORDER — NYSTATIN 100000 UNIT/GM EX CREA: 1.0000 "application " | TOPICAL_CREAM | Freq: Two times a day (BID) | CUTANEOUS | 0 refills | Status: DC

## 2016-04-13 NOTE — Telephone Encounter (Signed)
Have sent in plain nystatin to try - update [place] with effect.

## 2016-04-14 NOTE — Telephone Encounter (Signed)
Patient notified and said it was the IRON that was in question, not the cream. She was asking if she could take OTC vs Rx because her insurance wouldn't cover iron Rx. Advised to take 1 OTC TID.

## 2016-04-15 ENCOUNTER — Telehealth: Payer: Self-pay

## 2016-04-15 NOTE — Telephone Encounter (Addendum)
Ok to do OTC iron 3x/day x1 month as long as tolerated. If GI upset or constipation, may decrease to once daily.

## 2016-04-15 NOTE — Telephone Encounter (Signed)
Message left advising patient that info was correct, that no immediate response would be noticed. Also advised that if TID dosing was too hard on stomach, then she could decrease to QD per Dr. Darnell Level.

## 2016-04-15 NOTE — Telephone Encounter (Signed)
Pt left v/m; pt started taking iron pills this week and wants to know how long should take before feeling better and less fatigued. Pt is presently ferrous sulfate 1 tab tid. I advised pt it can be individualized depending on how low the iron level was and how pts body absorbs the iron. Usually after couple of months of taking med pt has labs retested. Pt said she already has that set up but pts husband thought she should have an immediate response to med and that is not the case. Pt request cb from [facility].

## 2016-04-29 ENCOUNTER — Ambulatory Visit: Payer: Self-pay | Admitting: Family Medicine

## 2016-05-16 ENCOUNTER — Other Ambulatory Visit: Payer: Self-pay | Admitting: Family Medicine

## 2016-05-16 NOTE — Telephone Encounter (Signed)
Ok to refill? Last filled 02/03/16 #60 1RF

## 2016-05-17 NOTE — Telephone Encounter (Signed)
plz phone in. 

## 2016-05-17 NOTE — Telephone Encounter (Signed)
Rx called in as directed.   

## 2016-05-30 ENCOUNTER — Other Ambulatory Visit (INDEPENDENT_AMBULATORY_CARE_PROVIDER_SITE_OTHER): Payer: BLUE CROSS/BLUE SHIELD

## 2016-05-30 DIAGNOSIS — D649 Anemia, unspecified: Secondary | ICD-10-CM

## 2016-05-30 DIAGNOSIS — E1165 Type 2 diabetes mellitus with hyperglycemia: Secondary | ICD-10-CM | POA: Diagnosis not present

## 2016-05-30 DIAGNOSIS — E118 Type 2 diabetes mellitus with unspecified complications: Secondary | ICD-10-CM

## 2016-05-30 DIAGNOSIS — E785 Hyperlipidemia, unspecified: Secondary | ICD-10-CM

## 2016-05-30 LAB — IBC PANEL
Iron: 62 ug/dL (ref 42–145)
Saturation Ratios: 16.4 % — ABNORMAL LOW (ref 20.0–50.0)
Transferrin: 270 mg/dL (ref 212.0–360.0)

## 2016-05-30 LAB — LIPID PANEL
Cholesterol: 174 mg/dL (ref 0–200)
HDL: 38.8 mg/dL — ABNORMAL LOW (ref >39.00)
LDL Cholesterol: 100 mg/dL — ABNORMAL HIGH (ref 0–99)
NonHDL: 134.7
Total CHOL/HDL Ratio: 4
Triglycerides: 173 mg/dL — ABNORMAL HIGH (ref 0.0–149.0)
VLDL: 34.6 mg/dL (ref 0.0–40.0)

## 2016-05-30 LAB — HEMOGLOBIN A1C: Hgb A1c MFr Bld: 7.6 % — ABNORMAL HIGH (ref 4.6–6.5)

## 2016-05-30 LAB — FERRITIN: Ferritin: 55.2 ng/mL (ref 10.0–291.0)

## 2016-05-31 ENCOUNTER — Telehealth: Payer: Self-pay | Admitting: *Deleted

## 2016-05-31 NOTE — Telephone Encounter (Signed)
Patient requested lab results and will need to be called after 2:30 pm  Pt contact [phone]

## 2016-05-31 NOTE — Telephone Encounter (Signed)
Patient was informed of results.  Patient understood and no questions, comments, or concerns at this time.  

## 2016-06-03 ENCOUNTER — Ambulatory Visit (INDEPENDENT_AMBULATORY_CARE_PROVIDER_SITE_OTHER): Payer: BLUE CROSS/BLUE SHIELD | Admitting: Family Medicine

## 2016-06-03 ENCOUNTER — Encounter: Payer: Self-pay | Admitting: Family Medicine

## 2016-06-03 VITALS — BP 110/70 | HR 89 | Temp 97.8°F | Wt 268.0 lb

## 2016-06-03 DIAGNOSIS — E785 Hyperlipidemia, unspecified: Secondary | ICD-10-CM

## 2016-06-03 DIAGNOSIS — L304 Erythema intertrigo: Secondary | ICD-10-CM | POA: Diagnosis not present

## 2016-06-03 DIAGNOSIS — IMO0001 Reserved for inherently not codable concepts without codable children: Secondary | ICD-10-CM

## 2016-06-03 DIAGNOSIS — F418 Other specified anxiety disorders: Secondary | ICD-10-CM | POA: Diagnosis not present

## 2016-06-03 DIAGNOSIS — E1165 Type 2 diabetes mellitus with hyperglycemia: Secondary | ICD-10-CM | POA: Diagnosis not present

## 2016-06-03 DIAGNOSIS — R0981 Nasal congestion: Secondary | ICD-10-CM

## 2016-06-03 MED ORDER — BUPROPION HCL 75 MG PO TABS: 75.0000 mg | ORAL_TABLET | Freq: Two times a day (BID) | ORAL | 11 refills | Status: DC

## 2016-06-03 MED ORDER — CLOTRIMAZOLE 1 % EX CREA: 1.0000 "application " | TOPICAL_CREAM | Freq: Two times a day (BID) | CUTANEOUS | 0 refills | Status: DC

## 2016-06-03 MED ORDER — FERROUS SULFATE 325 (65 FE) MG PO TBEC: 325.0000 mg | DELAYED_RELEASE_TABLET | Freq: Two times a day (BID) | ORAL | Status: DC

## 2016-06-03 NOTE — Patient Instructions (Addendum)
Stop mycolog. Start clotrimazole cream sent to pharmacy. If no better, let me know for oral antifungal.  For sinuses - try nasal saline and over the counter rhinocort. If ongoing sinus congestion/pressure past 10 days, let me know for antibiotic course.  Restart red yeast rice as it's helping cholesterol levels.  Return as needed or in 3 months for follow up visit.

## 2016-06-03 NOTE — Progress Notes (Signed)
Pre visit review using our clinic review tool, if applicable. No additional management support is needed unless otherwise documented below in the visit note. 

## 2016-06-03 NOTE — Progress Notes (Signed)
BP 110/70 (BP Location: Left Arm, Patient Position: Sitting, Cuff Size: Large)   Pulse 89   Temp 97.8 F (36.6 C) (Oral)   Wt 268 lb (121.6 kg)   SpO2 98%   BMI 46.00 kg/m    CC: 3 mo f/u visit Subjective:    Patient ID: Lisa Curry, female    DOB: [birth year], 45 y.o.   MRN: [id number]  HPI: Lisa Curry is a 45 y.o. female presenting on 06/03/2016 for Follow-up (3 months)   See recent notes for details.  Busy summer - caring for husband's grandparents.   Using nystatin TCI ointment and summer's eve powder for rash along groin that is not healing.   Increased sinus congestion over last 1 month, initially better but over last 5 days symptoms returning.  No fevers/chills. Guaifenesin hasn't helped. Has not tolerated flonase.   DM - regularly does check sugars fasting 100-150s. Compliant with antihyperglycemic regimen which includes: glimepiride 8mg  daily, tradjenta 5mg  daily, metformin 500mg  XR. Ongoing loose stools attributed to metformin. Denies low sugars or hypoglycemic symptoms. Denies paresthesias. Last diabetic eye exam DUE. Pneumovax: 2015. Prevnar: not due. Lab Results  Component Value Date   HGBA1C 7.6 (H) 05/30/2016   Diabetic Foot Exam - Simple   Simple Foot Form Diabetic Foot exam was performed with the following findings:  Yes 06/03/2016  4:32 PM  Visual Inspection No deformities, no ulcerations, no other skin breakdown bilaterally:  Yes Sensation Testing Intact to touch and monofilament testing bilaterally:  Yes Pulse Check Posterior Tibialis and Dorsalis pulse intact bilaterally:  Yes Comments      HLD - off RYR for last week - worried it was causing worsening yeast rash.   Relevant past medical, surgical, family and social history reviewed and updated as indicated. Interim medical history since our last visit reviewed. Allergies and medications reviewed and updated. Current Outpatient Prescriptions on File Prior to Visit  Medication Sig  .  Cholecalciferol (VITAMIN D3) 1000 UNITS CAPS Take 1 capsule (1,000 Units total) by mouth daily.  . clonazePAM (KLONOPIN) 0.5 MG tablet TAKE 1 TABLET BY MOUTH DAILY MAY TAKE ADDITIONAL TABLET IF NEEDED DAILY  . DEXILANT 60 MG capsule TAKE 1 CAPSULE (60 MG TOTAL) BY MOUTH DAILY.  Marland Kitchen glimepiride (AMARYL) 4 MG tablet TAKE 2 TABLETS (8 MG TOTAL) BY MOUTH DAILY WITH BREAKFAST.  Marland Kitchen glucose blood test strip 1 each by Other route 2 (two) times daily as needed for other. Use to check sugar twice daily and as needed. Dx:E11.65  . ibuprofen (ADVIL,MOTRIN) 200 MG tablet Take 800 mg by mouth as needed.  . metFORMIN (GLUCOPHAGE-XR) 500 MG 24 hr tablet TAKE 1 TABLET (500 MG TOTAL) BY MOUTH DAILY WITH BREAKFAST.  . Multiple Vitamin (MULTIVITAMIN) capsule Take 1 capsule by mouth daily.  . Red Yeast Rice 600 MG CAPS Take 1 capsule by mouth daily.  . TRADJENTA 5 MG TABS tablet TAKE 1 TABLET (5 MG TOTAL) BY MOUTH DAILY.  Marland Kitchen venlafaxine XR (EFFEXOR-XR) 75 MG 24 hr capsule Take 3 capsules (225 mg total) by mouth daily with breakfast. Take three capsules (225 mg) every morning.   No current facility-administered medications on file prior to visit.     Review of Systems Per HPI unless specifically indicated in ROS section     Objective:    BP 110/70 (BP Location: Left Arm, Patient Position: Sitting, Cuff Size: Large)   Pulse 89   Temp 97.8 F (36.6 C) (Oral)   Wt 268 lb (  121.6 kg)   SpO2 98%   BMI 46.00 kg/m   Wt Readings from Last 3 Encounters:  06/03/16 268 lb (121.6 kg)  04/07/16 278 lb 8 oz (126.3 kg)  01/26/16 274 lb 4 oz (124.4 kg)    Physical Exam  Constitutional: She appears well-developed and well-nourished. No distress.  HENT:  Head: Normocephalic and atraumatic.  Right Ear: Hearing, tympanic membrane, external ear and ear canal normal.  Left Ear: Hearing, tympanic membrane, external ear and ear canal normal.  Nose: Nose normal. No mucosal edema or rhinorrhea. Right sinus exhibits no maxillary  sinus tenderness and no frontal sinus tenderness. Left sinus exhibits no maxillary sinus tenderness and no frontal sinus tenderness.  Mouth/Throat: Uvula is midline, oropharynx is clear and moist and mucous membranes are normal. No oropharyngeal exudate, posterior oropharyngeal edema, posterior oropharyngeal erythema or tonsillar abscesses.  Eyes: Conjunctivae and EOM are normal. Pupils are equal, round, and reactive to light. No scleral icterus.  Neck: Normal range of motion. Neck supple.  Cardiovascular: Normal rate, regular rhythm, normal heart sounds and intact distal pulses.   No murmur heard. Pulmonary/Chest: Effort normal and breath sounds normal. No respiratory distress. She has no wheezes. She has no rales.  Musculoskeletal: She exhibits no edema.  See HPI for foot exam if done  Lymphadenopathy:    She has no cervical adenopathy.  Skin: Skin is warm and dry. No rash noted.  Psychiatric: She has a normal mood and affect.  Nursing note and vitals reviewed.  Results for orders placed or performed in visit on 06/03/16  HM DIABETES EYE EXAM  Result Value Ref Range   HM Diabetic Eye Exam No Retinopathy No Retinopathy      Assessment & Plan:   Problem List Items Addressed This Visit    Depression with anxiety    Stable period       Diabetes mellitus type 2, uncontrolled ([place]) - Primary    Mild improvement but remains uncontrolled. Metformin may be causing GI intolerance.  No changes for now.       HLD (hyperlipidemia)    Encouraged restart RYR BID.       Intertrigo    Stop mycolog, start clotrimazole cream daily. Update if persistent rash to consider oral antifungal.       Severe obesity (BMI >= 40) (HCC)    Weight loss noted.       Sinus congestion    Given short duration, anticipate viral vs allergic. rec nasal saline and nasal steroid. Pt will try OTC nasacort.  Update if ongoing sxs past 7-10 days to consider antibiotic course.        Other Visit Diagnoses     None.      Follow up plan: Return in about 3 months (around 09/02/2016) for follow up visit.  Ria Bush, MD

## 2016-06-04 DIAGNOSIS — L304 Erythema intertrigo: Secondary | ICD-10-CM | POA: Insufficient documentation

## 2016-06-04 DIAGNOSIS — R0981 Nasal congestion: Secondary | ICD-10-CM | POA: Insufficient documentation

## 2016-06-04 NOTE — Assessment & Plan Note (Signed)
Weight loss noted  

## 2016-06-04 NOTE — Assessment & Plan Note (Signed)
Stop mycolog, start clotrimazole cream daily. Update if persistent rash to consider oral antifungal.

## 2016-06-04 NOTE — Assessment & Plan Note (Signed)
Given short duration, anticipate viral vs allergic. rec nasal saline and nasal steroid. Pt will try OTC nasacort.  Update if ongoing sxs past 7-10 days to consider antibiotic course.

## 2016-06-04 NOTE — Assessment & Plan Note (Signed)
Encouraged restart RYR BID.

## 2016-06-04 NOTE — Assessment & Plan Note (Signed)
Mild improvement but remains uncontrolled. Metformin may be causing GI intolerance.  No changes for now.

## 2016-06-04 NOTE — Assessment & Plan Note (Signed)
Stable period.  

## 2016-06-06 ENCOUNTER — Ambulatory Visit (INDEPENDENT_AMBULATORY_CARE_PROVIDER_SITE_OTHER): Payer: BLUE CROSS/BLUE SHIELD | Admitting: Psychology

## 2016-06-06 DIAGNOSIS — F4323 Adjustment disorder with mixed anxiety and depressed mood: Secondary | ICD-10-CM | POA: Diagnosis not present

## 2016-06-07 ENCOUNTER — Other Ambulatory Visit: Payer: Self-pay

## 2016-06-09 ENCOUNTER — Ambulatory Visit: Payer: BLUE CROSS/BLUE SHIELD | Admitting: Psychology

## 2016-06-14 ENCOUNTER — Ambulatory Visit: Payer: BLUE CROSS/BLUE SHIELD | Admitting: Psychology

## 2016-06-14 ENCOUNTER — Ambulatory Visit (INDEPENDENT_AMBULATORY_CARE_PROVIDER_SITE_OTHER): Payer: BLUE CROSS/BLUE SHIELD | Admitting: Psychology

## 2016-06-14 DIAGNOSIS — F4323 Adjustment disorder with mixed anxiety and depressed mood: Secondary | ICD-10-CM

## 2016-06-21 ENCOUNTER — Ambulatory Visit: Payer: BLUE CROSS/BLUE SHIELD | Admitting: Psychology

## 2016-06-29 ENCOUNTER — Ambulatory Visit (INDEPENDENT_AMBULATORY_CARE_PROVIDER_SITE_OTHER): Payer: BLUE CROSS/BLUE SHIELD | Admitting: Psychology

## 2016-06-29 DIAGNOSIS — F332 Major depressive disorder, recurrent severe without psychotic features: Secondary | ICD-10-CM

## 2016-06-30 DIAGNOSIS — M9904 Segmental and somatic dysfunction of sacral region: Secondary | ICD-10-CM | POA: Diagnosis not present

## 2016-06-30 DIAGNOSIS — M9901 Segmental and somatic dysfunction of cervical region: Secondary | ICD-10-CM | POA: Diagnosis not present

## 2016-06-30 DIAGNOSIS — M5137 Other intervertebral disc degeneration, lumbosacral region: Secondary | ICD-10-CM | POA: Diagnosis not present

## 2016-06-30 DIAGNOSIS — M9903 Segmental and somatic dysfunction of lumbar region: Secondary | ICD-10-CM | POA: Diagnosis not present

## 2016-07-01 DIAGNOSIS — M9903 Segmental and somatic dysfunction of lumbar region: Secondary | ICD-10-CM | POA: Diagnosis not present

## 2016-07-01 DIAGNOSIS — M9904 Segmental and somatic dysfunction of sacral region: Secondary | ICD-10-CM | POA: Diagnosis not present

## 2016-07-01 DIAGNOSIS — M5137 Other intervertebral disc degeneration, lumbosacral region: Secondary | ICD-10-CM | POA: Diagnosis not present

## 2016-07-01 DIAGNOSIS — M9901 Segmental and somatic dysfunction of cervical region: Secondary | ICD-10-CM | POA: Diagnosis not present

## 2016-07-04 ENCOUNTER — Telehealth: Payer: Self-pay

## 2016-07-04 DIAGNOSIS — M9903 Segmental and somatic dysfunction of lumbar region: Secondary | ICD-10-CM | POA: Diagnosis not present

## 2016-07-04 DIAGNOSIS — M9904 Segmental and somatic dysfunction of sacral region: Secondary | ICD-10-CM | POA: Diagnosis not present

## 2016-07-04 DIAGNOSIS — M9901 Segmental and somatic dysfunction of cervical region: Secondary | ICD-10-CM | POA: Diagnosis not present

## 2016-07-04 DIAGNOSIS — M5137 Other intervertebral disc degeneration, lumbosacral region: Secondary | ICD-10-CM | POA: Diagnosis not present

## 2016-07-04 NOTE — Telephone Encounter (Signed)
Pt left v/m that dexilant needs Prior Auth because not in ins formulary so pt can pick up med. Pt request cb.

## 2016-07-06 DIAGNOSIS — M9907 Segmental and somatic dysfunction of upper extremity: Secondary | ICD-10-CM | POA: Diagnosis not present

## 2016-07-06 DIAGNOSIS — M5137 Other intervertebral disc degeneration, lumbosacral region: Secondary | ICD-10-CM | POA: Diagnosis not present

## 2016-07-06 DIAGNOSIS — M62830 Muscle spasm of back: Secondary | ICD-10-CM | POA: Diagnosis not present

## 2016-07-06 DIAGNOSIS — M9901 Segmental and somatic dysfunction of cervical region: Secondary | ICD-10-CM | POA: Diagnosis not present

## 2016-07-06 DIAGNOSIS — M9906 Segmental and somatic dysfunction of lower extremity: Secondary | ICD-10-CM | POA: Diagnosis not present

## 2016-07-06 DIAGNOSIS — M9904 Segmental and somatic dysfunction of sacral region: Secondary | ICD-10-CM | POA: Diagnosis not present

## 2016-07-06 DIAGNOSIS — M9903 Segmental and somatic dysfunction of lumbar region: Secondary | ICD-10-CM | POA: Diagnosis not present

## 2016-07-06 NOTE — Telephone Encounter (Signed)
In your IN box for completion.  

## 2016-07-06 NOTE — Telephone Encounter (Signed)
PA faxed. Will await determination. Message left advising patient.

## 2016-07-06 NOTE — Telephone Encounter (Signed)
Agree. Signed and in Kim's box.

## 2016-07-07 ENCOUNTER — Ambulatory Visit (INDEPENDENT_AMBULATORY_CARE_PROVIDER_SITE_OTHER): Payer: BLUE CROSS/BLUE SHIELD | Admitting: Psychology

## 2016-07-07 DIAGNOSIS — F331 Major depressive disorder, recurrent, moderate: Secondary | ICD-10-CM | POA: Diagnosis not present

## 2016-07-08 NOTE — Telephone Encounter (Signed)
PA not required. Patient notified.

## 2016-07-11 DIAGNOSIS — M9903 Segmental and somatic dysfunction of lumbar region: Secondary | ICD-10-CM | POA: Diagnosis not present

## 2016-07-11 DIAGNOSIS — M9904 Segmental and somatic dysfunction of sacral region: Secondary | ICD-10-CM | POA: Diagnosis not present

## 2016-07-11 DIAGNOSIS — M5137 Other intervertebral disc degeneration, lumbosacral region: Secondary | ICD-10-CM | POA: Diagnosis not present

## 2016-07-11 DIAGNOSIS — M9901 Segmental and somatic dysfunction of cervical region: Secondary | ICD-10-CM | POA: Diagnosis not present

## 2016-07-11 DIAGNOSIS — M9906 Segmental and somatic dysfunction of lower extremity: Secondary | ICD-10-CM | POA: Diagnosis not present

## 2016-07-11 DIAGNOSIS — M9907 Segmental and somatic dysfunction of upper extremity: Secondary | ICD-10-CM | POA: Diagnosis not present

## 2016-07-14 DIAGNOSIS — M9903 Segmental and somatic dysfunction of lumbar region: Secondary | ICD-10-CM | POA: Diagnosis not present

## 2016-07-14 DIAGNOSIS — M5137 Other intervertebral disc degeneration, lumbosacral region: Secondary | ICD-10-CM | POA: Diagnosis not present

## 2016-07-14 DIAGNOSIS — M9901 Segmental and somatic dysfunction of cervical region: Secondary | ICD-10-CM | POA: Diagnosis not present

## 2016-07-14 DIAGNOSIS — M9904 Segmental and somatic dysfunction of sacral region: Secondary | ICD-10-CM | POA: Diagnosis not present

## 2016-07-18 DIAGNOSIS — M9903 Segmental and somatic dysfunction of lumbar region: Secondary | ICD-10-CM | POA: Diagnosis not present

## 2016-07-18 DIAGNOSIS — M9901 Segmental and somatic dysfunction of cervical region: Secondary | ICD-10-CM | POA: Diagnosis not present

## 2016-07-18 DIAGNOSIS — M9904 Segmental and somatic dysfunction of sacral region: Secondary | ICD-10-CM | POA: Diagnosis not present

## 2016-07-18 DIAGNOSIS — M5137 Other intervertebral disc degeneration, lumbosacral region: Secondary | ICD-10-CM | POA: Diagnosis not present

## 2016-07-20 ENCOUNTER — Ambulatory Visit (INDEPENDENT_AMBULATORY_CARE_PROVIDER_SITE_OTHER): Payer: BLUE CROSS/BLUE SHIELD | Admitting: Psychology

## 2016-07-20 DIAGNOSIS — F332 Major depressive disorder, recurrent severe without psychotic features: Secondary | ICD-10-CM | POA: Diagnosis not present

## 2016-07-22 DIAGNOSIS — M5137 Other intervertebral disc degeneration, lumbosacral region: Secondary | ICD-10-CM | POA: Diagnosis not present

## 2016-07-22 DIAGNOSIS — M9903 Segmental and somatic dysfunction of lumbar region: Secondary | ICD-10-CM | POA: Diagnosis not present

## 2016-07-22 DIAGNOSIS — M9901 Segmental and somatic dysfunction of cervical region: Secondary | ICD-10-CM | POA: Diagnosis not present

## 2016-07-22 DIAGNOSIS — M9904 Segmental and somatic dysfunction of sacral region: Secondary | ICD-10-CM | POA: Diagnosis not present

## 2016-07-26 DIAGNOSIS — M9904 Segmental and somatic dysfunction of sacral region: Secondary | ICD-10-CM | POA: Diagnosis not present

## 2016-07-26 DIAGNOSIS — M9901 Segmental and somatic dysfunction of cervical region: Secondary | ICD-10-CM | POA: Diagnosis not present

## 2016-07-26 DIAGNOSIS — M9903 Segmental and somatic dysfunction of lumbar region: Secondary | ICD-10-CM | POA: Diagnosis not present

## 2016-07-26 DIAGNOSIS — M5137 Other intervertebral disc degeneration, lumbosacral region: Secondary | ICD-10-CM | POA: Diagnosis not present

## 2016-07-28 DIAGNOSIS — M5137 Other intervertebral disc degeneration, lumbosacral region: Secondary | ICD-10-CM | POA: Diagnosis not present

## 2016-07-28 DIAGNOSIS — M9901 Segmental and somatic dysfunction of cervical region: Secondary | ICD-10-CM | POA: Diagnosis not present

## 2016-07-28 DIAGNOSIS — M9903 Segmental and somatic dysfunction of lumbar region: Secondary | ICD-10-CM | POA: Diagnosis not present

## 2016-07-28 DIAGNOSIS — M9904 Segmental and somatic dysfunction of sacral region: Secondary | ICD-10-CM | POA: Diagnosis not present

## 2016-08-01 DIAGNOSIS — M9901 Segmental and somatic dysfunction of cervical region: Secondary | ICD-10-CM | POA: Diagnosis not present

## 2016-08-01 DIAGNOSIS — M5137 Other intervertebral disc degeneration, lumbosacral region: Secondary | ICD-10-CM | POA: Diagnosis not present

## 2016-08-01 DIAGNOSIS — M9903 Segmental and somatic dysfunction of lumbar region: Secondary | ICD-10-CM | POA: Diagnosis not present

## 2016-08-01 DIAGNOSIS — M9904 Segmental and somatic dysfunction of sacral region: Secondary | ICD-10-CM | POA: Diagnosis not present

## 2016-08-04 DIAGNOSIS — M5137 Other intervertebral disc degeneration, lumbosacral region: Secondary | ICD-10-CM | POA: Diagnosis not present

## 2016-08-04 DIAGNOSIS — M9901 Segmental and somatic dysfunction of cervical region: Secondary | ICD-10-CM | POA: Diagnosis not present

## 2016-08-04 DIAGNOSIS — M9904 Segmental and somatic dysfunction of sacral region: Secondary | ICD-10-CM | POA: Diagnosis not present

## 2016-08-04 DIAGNOSIS — M9903 Segmental and somatic dysfunction of lumbar region: Secondary | ICD-10-CM | POA: Diagnosis not present

## 2016-08-09 DIAGNOSIS — M9904 Segmental and somatic dysfunction of sacral region: Secondary | ICD-10-CM | POA: Diagnosis not present

## 2016-08-09 DIAGNOSIS — M9901 Segmental and somatic dysfunction of cervical region: Secondary | ICD-10-CM | POA: Diagnosis not present

## 2016-08-09 DIAGNOSIS — M9903 Segmental and somatic dysfunction of lumbar region: Secondary | ICD-10-CM | POA: Diagnosis not present

## 2016-08-09 DIAGNOSIS — M5137 Other intervertebral disc degeneration, lumbosacral region: Secondary | ICD-10-CM | POA: Diagnosis not present

## 2016-08-15 ENCOUNTER — Telehealth: Payer: Self-pay | Admitting: Family Medicine

## 2016-08-15 DIAGNOSIS — M9904 Segmental and somatic dysfunction of sacral region: Secondary | ICD-10-CM | POA: Diagnosis not present

## 2016-08-15 DIAGNOSIS — M5137 Other intervertebral disc degeneration, lumbosacral region: Secondary | ICD-10-CM | POA: Diagnosis not present

## 2016-08-15 DIAGNOSIS — M9901 Segmental and somatic dysfunction of cervical region: Secondary | ICD-10-CM | POA: Diagnosis not present

## 2016-08-15 DIAGNOSIS — M9903 Segmental and somatic dysfunction of lumbar region: Secondary | ICD-10-CM | POA: Diagnosis not present

## 2016-08-15 NOTE — Telephone Encounter (Signed)
Too early for labs

## 2016-08-15 NOTE — Telephone Encounter (Signed)
Patient has appt 08/16/16, informed her to ask for lab orders to be placed at that time before 09/08/16 OV; understood & agreed/SLS 11/27

## 2016-08-15 NOTE — Telephone Encounter (Signed)
Pt would like to know if she needs to have labs done at next visit.  Please call [phone] to schedule if needed

## 2016-08-16 ENCOUNTER — Ambulatory Visit (INDEPENDENT_AMBULATORY_CARE_PROVIDER_SITE_OTHER): Payer: BLUE CROSS/BLUE SHIELD | Admitting: Family Medicine

## 2016-08-16 ENCOUNTER — Encounter: Payer: Self-pay | Admitting: Family Medicine

## 2016-08-16 VITALS — BP 124/72 | HR 97 | Temp 98.3°F | Resp 16 | Wt 271.0 lb

## 2016-08-16 DIAGNOSIS — Z8719 Personal history of other diseases of the digestive system: Secondary | ICD-10-CM | POA: Insufficient documentation

## 2016-08-16 DIAGNOSIS — M545 Low back pain, unspecified: Secondary | ICD-10-CM | POA: Insufficient documentation

## 2016-08-16 DIAGNOSIS — K429 Umbilical hernia without obstruction or gangrene: Secondary | ICD-10-CM | POA: Diagnosis not present

## 2016-08-16 MED ORDER — CYCLOBENZAPRINE HCL 10 MG PO TABS: 10.0000 mg | ORAL_TABLET | Freq: Two times a day (BID) | ORAL | 1 refills | Status: DC | PRN

## 2016-08-16 MED ORDER — PREDNISONE 20 MG PO TABS: ORAL_TABLET | ORAL | 0 refills | Status: DC

## 2016-08-16 NOTE — Assessment & Plan Note (Signed)
Ongoing over last 1+ month, no improvement with chiropractor. H/o what sounds like L4 discectomy. Exam consistent with multifactorial cause of back pain - anticipate combination of some sacral iliac joint pain, degenerative disk disease, and weak hip abductors.  Recommended weight loss through regular walking routine, rec lateral leg raises to strengthen hip abductors, provided with exercises from SM pt advisor on lower back pain. Will also treat with short prednisone course then may restart NSAID, continue flexeril PRN muscle spasm.

## 2016-08-16 NOTE — Assessment & Plan Note (Signed)
small 

## 2016-08-16 NOTE — Assessment & Plan Note (Signed)
Encouraged weight loss through regular walking routine.

## 2016-08-16 NOTE — Patient Instructions (Signed)
I think this is multifactorial back pain - likely from combination of some sacroiliitis, arthritis of lower back, and bursitis of hip.  Treat with prednisone course and continue flexeril. When prednisone course completed, restart ibuporfen as tolerated for discomfort. Start walking routine - goal 30 min/day 5 times a week.  Exercises provided today. Do lateral leg raises to help strengthen hip muscles.  Let us know if no better.

## 2016-08-16 NOTE — Progress Notes (Signed)
BP 124/72 (BP Location: Left Arm, Patient Position: Sitting, Cuff Size: Large)   Pulse 97   Temp 98.3 F (36.8 C) (Oral)   Resp 16   Wt 271 lb (122.9 kg)   SpO2 97%   BMI 46.52 kg/m    CC: back pain Subjective:    Patient ID: Lisa Curry, female    DOB: 1971-02-03, 45 y.o.   MRN: [id number]  HPI: JANNAE PERRIGO is a 45 y.o. female presenting on 08/16/2016 for bulge in belly button (since September? ) and Back Pain   Lower back pain over the past month. Points to mid lower back with radiation to buttock, also discomfort at L lateral flank. Denies inciting trauma/injury or falls. Treating by seeing chiropractor - has seen twice weekly for last 4-6 wks. Not improving as she would like. Told possible SI joint inflammation.  Also treating with flexeril, ibuprofen 600mg , ice packs - all provide temporary relief. Prolonged standing and sitting worsens pain.   No fevers, shooting pain down legs, numbness/weakness of legs, bowel/bladder incontinence.   H/o lumbar surgery 1996 - "had part of L4 removed for herniated disc."   Noticing belly button bulge over last 2 months.   Relevant past medical, surgical, family and social history reviewed and updated as indicated. Interim medical history since our last visit reviewed. Allergies and medications reviewed and updated. Current Outpatient Prescriptions on File Prior to Visit  Medication Sig  . buPROPion (WELLBUTRIN) 75 MG tablet Take 1 tablet (75 mg total) by mouth 2 (two) times daily.  . Cholecalciferol (VITAMIN D3) 1000 UNITS CAPS Take 1 capsule (1,000 Units total) by mouth daily.  . clonazePAM (KLONOPIN) 0.5 MG tablet TAKE 1 TABLET BY MOUTH DAILY MAY TAKE ADDITIONAL TABLET IF NEEDED DAILY  . clotrimazole (LOTRIMIN) 1 % cream Apply 1 application topically 2 (two) times daily.  Marland Kitchen DEXILANT 60 MG capsule TAKE 1 CAPSULE (60 MG TOTAL) BY MOUTH DAILY.  . ferrous sulfate 325 (65 FE) MG EC tablet Take 1 tablet (325 mg total) by mouth 2 (two) times  daily.  Marland Kitchen glimepiride (AMARYL) 4 MG tablet TAKE 2 TABLETS (8 MG TOTAL) BY MOUTH DAILY WITH BREAKFAST.  Marland Kitchen glucose blood test strip 1 each by Other route 2 (two) times daily as needed for other. Use to check sugar twice daily and as needed. Dx:E11.65  . ibuprofen (ADVIL,MOTRIN) 200 MG tablet Take 800 mg by mouth as needed.  . metFORMIN (GLUCOPHAGE-XR) 500 MG 24 hr tablet TAKE 1 TABLET (500 MG TOTAL) BY MOUTH DAILY WITH BREAKFAST.  . Multiple Vitamin (MULTIVITAMIN) capsule Take 1 capsule by mouth daily.  . Red Yeast Rice 600 MG CAPS Take 1 capsule by mouth daily.  . TRADJENTA 5 MG TABS tablet TAKE 1 TABLET (5 MG TOTAL) BY MOUTH DAILY.  Marland Kitchen venlafaxine XR (EFFEXOR-XR) 75 MG 24 hr capsule Take 3 capsules (225 mg total) by mouth daily with breakfast. Take three capsules (225 mg) every morning.   No current facility-administered medications on file prior to visit.     Review of Systems Per HPI unless specifically indicated in ROS section     Objective:    BP 124/72 (BP Location: Left Arm, Patient Position: Sitting, Cuff Size: Large)   Pulse 97   Temp 98.3 F (36.8 C) (Oral)   Resp 16   Wt 271 lb (122.9 kg)   SpO2 97%   BMI 46.52 kg/m   Wt Readings from Last 3 Encounters:  08/16/16 271 lb (122.9 kg)  06/03/16 268 lb (121.6 kg)  04/07/16 278 lb 8 oz (126.3 kg)    Physical Exam  Constitutional: She is oriented to person, place, and time. She appears well-developed and well-nourished. No distress.  Abdominal: A hernia (small umbilical) is present.  Musculoskeletal: She exhibits no edema.  Discomfort pain midline mid to lower lumbar spine L paraspinous mm tenderness Neg SLR bilaterally. No pain with int/ext rotation at hip. Neg FABER. Mild discomfort at SIJ and GTB   Neurological: She is alert and oriented to person, place, and time.  5/5 strength BLE  Skin: Skin is warm and dry. No rash noted.  Psychiatric: She has a normal mood and affect.  Nursing note and vitals  reviewed.  Results for orders placed or performed in visit on 06/03/16  HM DIABETES EYE EXAM  Result Value Ref Range   HM Diabetic Eye Exam No Retinopathy No Retinopathy      Assessment & Plan:   Problem List Items Addressed This Visit    Lower back pain - Primary    Ongoing over last 1+ month, no improvement with chiropractor. H/o what sounds like L4 discectomy. Exam consistent with multifactorial cause of back pain - anticipate combination of some sacral iliac joint pain, degenerative disk disease, and weak hip abductors.  Recommended weight loss through regular walking routine, rec lateral leg raises to strengthen hip abductors, provided with exercises from SM pt advisor on lower back pain. Will also treat with short prednisone course then may restart NSAID, continue flexeril PRN muscle spasm.       Relevant Medications   predniSONE (DELTASONE) 20 MG tablet   cyclobenzaprine (FLEXERIL) 10 MG tablet   Severe obesity (BMI >= 40) (HCC)    Encouraged weight loss through regular walking routine.       Umbilical hernia    small          Follow up plan: Return if symptoms worsen or fail to improve.  Ria Bush, MD

## 2016-08-16 NOTE — Progress Notes (Signed)
Pre visit review using our clinic review tool, if applicable. No additional management support is needed unless otherwise documented below in the visit note. 

## 2016-08-21 ENCOUNTER — Encounter (HOSPITAL_BASED_OUTPATIENT_CLINIC_OR_DEPARTMENT_OTHER): Payer: Self-pay | Admitting: Emergency Medicine

## 2016-08-21 ENCOUNTER — Emergency Department (HOSPITAL_BASED_OUTPATIENT_CLINIC_OR_DEPARTMENT_OTHER): Payer: BLUE CROSS/BLUE SHIELD

## 2016-08-21 ENCOUNTER — Emergency Department (HOSPITAL_BASED_OUTPATIENT_CLINIC_OR_DEPARTMENT_OTHER)
Admission: EM | Admit: 2016-08-21 | Discharge: 2016-08-21 | Disposition: A | Discharge status: Home / Self Care | Payer: BLUE CROSS/BLUE SHIELD | Attending: Emergency Medicine | Admitting: Emergency Medicine

## 2016-08-21 DIAGNOSIS — E119 Type 2 diabetes mellitus without complications: Secondary | ICD-10-CM | POA: Diagnosis not present

## 2016-08-21 DIAGNOSIS — Z7984 Long term (current) use of oral hypoglycemic drugs: Secondary | ICD-10-CM | POA: Diagnosis not present

## 2016-08-21 DIAGNOSIS — Z79899 Other long term (current) drug therapy: Secondary | ICD-10-CM | POA: Diagnosis not present

## 2016-08-21 DIAGNOSIS — R51 Headache: Secondary | ICD-10-CM | POA: Insufficient documentation

## 2016-08-21 DIAGNOSIS — Z791 Long term (current) use of non-steroidal anti-inflammatories (NSAID): Secondary | ICD-10-CM | POA: Insufficient documentation

## 2016-08-21 DIAGNOSIS — R519 Headache, unspecified: Secondary | ICD-10-CM

## 2016-08-21 DIAGNOSIS — R112 Nausea with vomiting, unspecified: Secondary | ICD-10-CM | POA: Diagnosis present

## 2016-08-21 MED ORDER — MAGNESIUM SULFATE 2 GM/50ML IV SOLN
2.0000 g | Freq: Once | INTRAVENOUS | Status: AC
  Administered 2016-08-21: 2 g via INTRAVENOUS
  Filled 2016-08-21: qty 50

## 2016-08-21 MED ORDER — KETOROLAC TROMETHAMINE 15 MG/ML IJ SOLN
15.0000 mg | Freq: Once | INTRAMUSCULAR | Status: AC
  Administered 2016-08-21: 15 mg via INTRAVENOUS
  Filled 2016-08-21: qty 1

## 2016-08-21 MED ORDER — SODIUM CHLORIDE 0.9 % IV BOLUS (SEPSIS)
1000.0000 mL | Freq: Once | INTRAVENOUS | Status: AC
  Administered 2016-08-21: 1000 mL via INTRAVENOUS

## 2016-08-21 MED ORDER — METOCLOPRAMIDE HCL 5 MG/ML IJ SOLN
10.0000 mg | Freq: Once | INTRAMUSCULAR | Status: AC
  Administered 2016-08-21: 10 mg via INTRAVENOUS
  Filled 2016-08-21: qty 2

## 2016-08-21 NOTE — ED Provider Notes (Signed)
[facility] DEPT [facility] Provider Note   CSN: [id number] Arrival date & time: 08/21/16  1447  By signing my name below, I, Hansel Feinstein, attest that this documentation has been prepared under the direction and in the presence of Fatima Blank, MD. Electronically Signed: Hansel Feinstein, ED Scribe. 08/21/16. 3:35 PM.     History   Chief Complaint Chief Complaint  Patient presents with  . Migraine    HPI Lisa Curry is a 45 y.o. female with h/o migraine who presents to the Emergency Department complaining of moderate, constant, frontal and temporal HA onset this morning upon waking up with associated nausea, phonophobia, photophobia, osmophobia, 2 episodes of emesis. Pt states her HA did not wake her from sleep. Pt states her typical migraines occur once a year, are accompanied by sound sensitivity and nausea, and alleviated with rest. She states her current HA is not similar to prior migraines because her typical migraines are one-sided. She states her symptoms are worsened with movement, with bending and lying down to a throbbing pain and alleviated with warm compresses. She states she has tried Allegra-D and Excedrin with mild relief of pain. Pt notes she has had post-nasal drip and congestion for the last month. She denies rhinorrhea, cough, CP, SOB, abdominal pain, fever, diarrhea, weakness, pain radiation to the occiput or neck.   The history is provided by the patient. No language interpreter was used.    Past Medical History:  Diagnosis Date  . Allergy   . Barrett esophagus    [place], have not received EGD report  . Depression with anxiety   . Diabetes mellitus without complication ([place])    DSME 04/2014  . Gastric polyp 05/2012   h/o EGD  . GERD (gastroesophageal reflux disease)   . History of Helicobacter pylori infection   . HLD (hyperlipidemia)   . Hyperlipidemia   . Vitamin D deficiency     Patient Active Problem List   Diagnosis Date Noted  . Umbilical hernia  0000000  . Lower back pain 08/16/2016  . Intertrigo 06/04/2016  . Sinus congestion 06/04/2016  . Left knee pain 12/15/2015  . Vitamin D deficiency   . HLD (hyperlipidemia)   . Health maintenance examination 01/20/2015  . Fatigue 02/27/2014  . Severe obesity (BMI >= 40) ([place]) 11/04/2013  . GERD (gastroesophageal reflux disease) 11/04/2013  . Diabetes mellitus type 2, uncontrolled ([place]) 11/04/2013  . Depression with anxiety 11/04/2013    Past Surgical History:  Procedure Laterality Date  . BACK SURGERY  03/1995  . CESAREAN SECTION  6/95, 4/98  . DILATION AND CURETTAGE OF UTERUS  2011  . ESOPHAGOGASTRODUODENOSCOPY  2012?   F[facility] L[facility] at [facility]  . NASAL SINUS SURGERY  03/2001    OB History    No data available       Home Medications    Prior to Admission medications   Medication Sig Start Date End Date Taking? Authorizing Provider  buPROPion (WELLBUTRIN) 75 MG tablet Take 1 tablet (75 mg total) by mouth 2 (two) times daily. 06/03/16  Yes Ria Bush, MD  Cholecalciferol (VITAMIN D3) 1000 UNITS CAPS Take 1 capsule (1,000 Units total) by mouth daily. 08/15/15  Yes Ria Bush, MD  clonazePAM (KLONOPIN) 0.5 MG tablet TAKE 1 TABLET BY MOUTH DAILY MAY TAKE ADDITIONAL TABLET IF NEEDED DAILY 05/17/16  Yes Ria Bush, MD  clotrimazole (LOTRIMIN) 1 % cream Apply 1 application topically 2 (two) times daily. 06/03/16  Yes Ria Bush, MD  cyclobenzaprine [facility])  10 MG tablet Take 1 tablet (10 mg total) by mouth 2 (two) times daily as needed for muscle spasms. 08/16/16  Yes Ria Bush, MD  DEXILANT 60 MG capsule TAKE 1 CAPSULE (60 MG TOTAL) BY MOUTH DAILY. 10/13/15  Yes Ria Bush, MD  ferrous sulfate 325 (65 FE) MG EC tablet Take 1 tablet (325 mg total) by mouth 2 (two) times daily. 06/03/16  Yes Ria Bush, MD  glimepiride (AMARYL) 4 MG tablet TAKE 2 TABLETS (8 MG TOTAL) BY MOUTH DAILY WITH BREAKFAST. 10/13/15  Yes Ria Bush, MD    glucose blood test strip 1 each by Other route 2 (two) times daily as needed for other. Use to check sugar twice daily and as needed. Dx:E11.65 11/06/15  Yes Ria Bush, MD  ibuprofen (ADVIL,MOTRIN) 200 MG tablet Take 800 mg by mouth as needed.   Yes Historical Provider, MD  metFORMIN (GLUCOPHAGE-XR) 500 MG 24 hr tablet TAKE 1 TABLET (500 MG TOTAL) BY MOUTH DAILY WITH BREAKFAST. 03/25/16  Yes Ria Bush, MD  Multiple Vitamin (MULTIVITAMIN) capsule Take 1 capsule by mouth daily.   Yes Historical Provider, MD  predniSONE (DELTASONE) 20 MG tablet Take two tablets daily for 3 days followed by one tablet daily for 4 days 08/16/16  Yes Ria Bush, MD  Red Yeast Rice 600 MG CAPS Take 1 capsule by mouth daily.   Yes Historical Provider, MD  TRADJENTA 5 MG TABS tablet TAKE 1 TABLET (5 MG TOTAL) BY MOUTH DAILY. 02/12/16  Yes Ria Bush, MD  venlafaxine XR (EFFEXOR-XR) 75 MG 24 hr capsule Take 3 capsules (225 mg total) by mouth daily with breakfast. Take three capsules (225 mg) every morning. 01/13/16  Yes Ria Bush, MD    Family History Family History  Problem Relation Age of Onset  . Breast cancer Maternal Grandmother 50  . Heart disease Paternal Grandfather   . Supraventricular tachycardia Sister   . Other Sister     Postural Orthostatic Tachycardia Syndrome  . Other Maternal Aunt     hysterectomy for irregular bleed with abnormal pap    Social History Social History  Substance Use Topics  . Smoking status: Never Smoker  . Smokeless tobacco: Never Used  . Alcohol use No     Allergies   Biaxin [clarithromycin] and Metformin and related   Review of Systems Review of Systems A complete 10 system review of systems was obtained and all systems are negative except as noted in the HPI and PMH.    Physical Exam Updated Vital Signs BP 159/93 (BP Location: Left Arm)   Pulse 89   Temp 98 F (36.7 C) (Oral)   Resp 19   Ht 5\' 4"  (1.626 m)   Wt 270 lb (122.5 kg)    LMP 07/26/2016 (Exact Date)   SpO2 96%   BMI 46.35 kg/m   Physical Exam  Constitutional: She is oriented to person, place, and time.  HENT:  Right Ear: Tympanic membrane normal.  Left Ear: Tympanic membrane normal.  Nose: Right sinus exhibits no maxillary sinus tenderness and no frontal sinus tenderness. Left sinus exhibits no maxillary sinus tenderness and no frontal sinus tenderness.  Positive postnasal drip. Swollen nasal mucosa, no nasal or palatal escar. Pressure applied to sinuses relieved pain. No facial swelling.   Eyes: EOM are normal. Pupils are equal, round, and reactive to light. No scleral icterus.  No papilledema   Pulmonary/Chest: Effort normal and breath sounds normal. No respiratory distress. She has no wheezes. She has no rales.  Neurological: She is alert and oriented to person, place, and time. No cranial nerve deficit.  Mental Status: Alert and oriented to person, place, and time. Attention and concentration normal. Speech clear. Recent memory is intac  Cranial Nerves  II Visual Fields: Intact to confrontation. Visual fields intact. III, IV, VI: Pupils equal and reactive to light and near. Full eye movement without nystagmus  V Facial Sensation: Normal. No weakness of masticatory muscles  VII: No facial weakness or asymmetry  VIII Auditory Acuity: Grossly normal  IX/X: The uvula is midline; the palate elevates symmetrically  XI: Normal sternocleidomastoid and trapezius strength  XII: The tongue is midline. No atrophy or fasciculations.   Motor System: Muscle Strength: 5/5 and symmetric in the upper and lower extremities. No pronation or drift.  Muscle Tone: Tone and muscle bulk are normal in the upper and lower extremities.   Reflexes: DTRs: 2+ and symmetrical in all four extremities. Plantar responses are flexor bilaterally.  Coordination:  No tremor.  Sensation: Intact to light touch.       ED Treatments / Results   DIAGNOSTIC STUDIES: Oxygen  Saturation is 96% on RA, adequate by my interpretation.    COORDINATION OF CARE: 3:30 PM Discussed treatment plan with pt at bedside which includes symptomatic therapy and pt agreed to plan.    Labs (all labs ordered are listed, but only abnormal results are displayed) Labs Reviewed - No data to display  EKG  EKG Interpretation None       Radiology No results found.  Procedures Procedures (including critical care time)  Medications Ordered in ED Medications - No data to display   Initial Impression / Assessment and Plan / ED Course  I have reviewed the triage vital signs and the nursing notes.  Pertinent labs & imaging results that were available during my care of the patient were reviewed by me and considered in my medical decision making (see chart for details).  Clinical Course as of Aug 21 1728  Nancy Fetter Aug 21, 2016  1600 Different type headache than normal migraine headache. Severe on onset however this does not sound like thunderclap headache. However patient reports that she's having difficulty performing ADLs due to the severity of the headache.  No focal deficits or papilledema on exam. Low suspicion for venous sinus thrombosis, mucormycosis, sinusitis.  [PC]  [id number] CT head negative.  [PC]  [id number] Patient given IV hydration, magnesium, Reglan, Toradol which has significant improvement in patient's symptomatology.   Safe for discharge with strict return precautions.  [PC]    Clinical Course User Index [PC] Fatima Blank, MD      Final Clinical Impressions(s) / ED Diagnoses   Final diagnoses:  Bad headache   Disposition: Discharge  Condition: Good  I have discussed the results, Dx and Tx plan with the patient who expressed understanding and agree(s) with the plan. Discharge instructions discussed at great length. The patient was given strict return precautions who verbalized understanding of the instructions. No further questions at time of discharge.     Current Discharge Medication List      Follow Up: Ria Bush, MD [place] [id number] [phone]  Schedule an appointment as soon as possible for a visit  in 3-5 days, If symptoms do not improve or  worsen   I personally performed the services described in this documentation, which was scribed in my presence. The recorded information has been reviewed and is accurate.  Fatima Blank, MD 08/21/16 [phone]

## 2016-08-21 NOTE — ED Notes (Signed)
ED Provider at bedside. 

## 2016-08-21 NOTE — ED Triage Notes (Signed)
Pt states she awoke this morning with a severe headache worse than her usual migraine.   Pt states laying down normally helps her migraine pains but today's headache is unrelieved by everything.

## 2016-08-21 NOTE — ED Notes (Signed)
Patient denies pain and is resting comfortably.  

## 2016-08-22 ENCOUNTER — Telehealth: Payer: Self-pay | Admitting: Family Medicine

## 2016-08-22 NOTE — Telephone Encounter (Signed)
Patient Name: Lisa Curry Gender: Female DOB: [birth year] Age: 45 Y 52 M 6 D Return Phone Number: [phone] (Primary), [phone] (Secondary) [place] [address] Client [facility] - Client Client Site [place] Physician Ria Bush - MD Contact Type Call Who Is Calling Patient / Member / Family / Caregiver Call Type Triage / Clinical Caller Name Alex Relationship To Patient Son Return Phone Number [phone] (Secondary) Chief Complaint Headache Reason for Call Symptomatic / Request for Health Information Initial Comment Caller's mom is on prednisone and has severe nausea, has a bad headache and sound and light makes it worse and when she lays down the headache got really bad. Appointment Disposition EMR Appointment Not Necessary Info pasted into Epic No PreDisposition Go to ED Translation No Nurse Assessment Nurse: Thad Ranger, RN, Langley Gauss Date/Time (Eastern Time): 08/21/2016 1:49:14 PM Confirm and document reason for call. If symptomatic, describe symptoms. ---Pt on prednisone for her back, and has severe nausea, has a bad headache and sound and light makes it worse and when she lays down the headache got really bad. Does the patient have any new or worsening symptoms? ---Yes Will a triage be completed? ---Yes Related visit to physician within the last 2 weeks? ---Yes Does the PT have any chronic conditions? (i.e. diabetes, asthma, etc.) ---Yes List chronic conditions. ---Diabetes Is the patient pregnant or possibly pregnant? (Ask all females between the ages of 22-55) ---No Is this a behavioral health or substance abuse call? ---No Guidelines Guideline Title Affirmed Question Affirmed Notes Nurse Date/Time (Eastern Time) Headache [1] SEVERE headache (e.g., excruciating) AND [2] "worst headache" of life Carmon, RN, Langley Gauss 08/21/2016 1:50:28 PM PLEASE NOTE: All timestamps contained  within this report are represented as [place] Standard Time. CONFIDENTIALTY NOTICE: This fax transmission is intended only for the addressee. It contains information that is legally privileged, confidential or otherwise protected from use or disclosure. If you are not the intended recipient, you are strictly prohibited from reviewing, disclosing, copying using or disseminating any of this information or taking any action in reliance on or regarding this information. If you have received this fax in error, please notify us immediately by telephone so that we can arrange for its return to [place]. Phone: [phone], Toll-Free: [phone], Fax: [phone] Page: 2 of 2 Call Id: [id number] [place]. Time Eilene Ghazi Time) Disposition Final User 08/21/2016 1:54:20 PM Go to ED Now (or PCP triage) Yes Carmon, RN, Yevette Edwards Understands: Yes Disagree/Comply: Comply Care Advice Given Per Guideline GO TO ED NOW (OR PCP TRIAGE): PAIN MEDICINES: ACETAMINOPHEN (E.G., TYLENOL): * Another choice is to take 1,000 mg (two 500 mg pills) every 8 hours as needed. Each Extra Strength Tylenol pill has 500 mg of acetaminophen. The most you should take each day is 3,000 mg (6 Extra Strength pills a day). CAUTION - NSAIDS (E.G., IBUPROFEN, NAPROXEN): DRIVING: Another adult should drive. * You may take this medicine with or without food. Taking it with food or milk may lessen the chance the drug will upset your stomach. CARE ADVICE given per Headache (Adult) guideline. Referrals GO TO FACILITY OTHER - SPECIFY

## 2016-08-22 NOTE — Telephone Encounter (Signed)
Seen at ER over weekend.

## 2016-08-25 ENCOUNTER — Other Ambulatory Visit: Payer: Self-pay | Admitting: Family Medicine

## 2016-09-01 ENCOUNTER — Other Ambulatory Visit (INDEPENDENT_AMBULATORY_CARE_PROVIDER_SITE_OTHER): Payer: BLUE CROSS/BLUE SHIELD

## 2016-09-01 ENCOUNTER — Other Ambulatory Visit: Payer: Self-pay | Admitting: Family Medicine

## 2016-09-01 DIAGNOSIS — E559 Vitamin D deficiency, unspecified: Secondary | ICD-10-CM

## 2016-09-01 DIAGNOSIS — IMO0001 Reserved for inherently not codable concepts without codable children: Secondary | ICD-10-CM

## 2016-09-01 DIAGNOSIS — E1165 Type 2 diabetes mellitus with hyperglycemia: Secondary | ICD-10-CM

## 2016-09-01 DIAGNOSIS — D649 Anemia, unspecified: Secondary | ICD-10-CM

## 2016-09-01 DIAGNOSIS — E785 Hyperlipidemia, unspecified: Secondary | ICD-10-CM | POA: Diagnosis not present

## 2016-09-01 LAB — BASIC METABOLIC PANEL
BUN: 6 mg/dL (ref 6–23)
CO2: 30 mEq/L (ref 19–32)
Calcium: 8.8 mg/dL (ref 8.4–10.5)
Chloride: 102 mEq/L (ref 96–112)
Creatinine, Ser: 0.69 mg/dL (ref 0.40–1.20)
GFR: 97.19 mL/min (ref >60.00)
Glucose, Bld: 167 mg/dL — ABNORMAL HIGH (ref 70–99)
Potassium: 3.9 mEq/L (ref 3.5–5.1)
Sodium: 139 mEq/L (ref 135–145)

## 2016-09-01 LAB — HEMOGLOBIN A1C: Hgb A1c MFr Bld: 7.7 % — ABNORMAL HIGH (ref 4.6–6.5)

## 2016-09-01 LAB — LDL CHOLESTEROL, DIRECT: Direct LDL: 112 mg/dL

## 2016-09-01 LAB — VITAMIN D 25 HYDROXY (VIT D DEFICIENCY, FRACTURES): VITD: 26.57 ng/mL — ABNORMAL LOW (ref 30.00–100.00)

## 2016-09-02 LAB — CBC WITH DIFFERENTIAL/PLATELET
Basophils Absolute: 0 10*3/uL (ref 0.0–0.1)
Basophils Relative: 0.2 % (ref 0.0–3.0)
Eosinophils Absolute: 0.2 10*3/uL (ref 0.0–0.7)
Eosinophils Relative: 1.8 % (ref 0.0–5.0)
HCT: 39.3 % (ref 36.0–46.0)
Hemoglobin: 13.2 g/dL (ref 12.0–15.0)
Lymphocytes Relative: 27.1 % (ref 12.0–46.0)
Lymphs Abs: 2.4 10*3/uL (ref 0.7–4.0)
MCHC: 33.7 g/dL (ref 30.0–36.0)
MCV: 89.6 fl (ref 78.0–100.0)
Monocytes Absolute: 0.2 10*3/uL (ref 0.1–1.0)
Monocytes Relative: 1.9 % — ABNORMAL LOW (ref 3.0–12.0)
Neutro Abs: 6.2 10*3/uL (ref 1.4–7.7)
Neutrophils Relative %: 69 % (ref 43.0–77.0)
Platelets: 313 10*3/uL (ref 150.0–400.0)
RBC: 4.39 Mil/uL (ref 3.87–5.11)
RDW: 15.2 % (ref 11.5–15.5)
WBC: 8.9 10*3/uL (ref 4.0–10.5)

## 2016-09-05 ENCOUNTER — Telehealth: Payer: Self-pay

## 2016-09-05 NOTE — Telephone Encounter (Signed)
Pt left v/m that ins co is requiring prior auth for dexilant. Pt request cb.

## 2016-09-06 NOTE — Telephone Encounter (Signed)
In your IN box for completion.  

## 2016-09-08 ENCOUNTER — Ambulatory Visit: Payer: Self-pay | Admitting: Family Medicine

## 2016-09-08 NOTE — Telephone Encounter (Signed)
Patient called and said she has 2 pills left.  Patient can be reached at [phone].

## 2016-09-09 NOTE — Telephone Encounter (Signed)
Filled and in Kim's box. 

## 2016-09-09 NOTE — Telephone Encounter (Signed)
PA was faxed to [facility].

## 2016-09-13 ENCOUNTER — Ambulatory Visit: Payer: Self-pay | Admitting: Family Medicine

## 2016-09-13 ENCOUNTER — Other Ambulatory Visit: Payer: Self-pay | Admitting: *Deleted

## 2016-09-13 DIAGNOSIS — Z0289 Encounter for other administrative examinations: Secondary | ICD-10-CM

## 2016-09-13 MED ORDER — DEXLANSOPRAZOLE 60 MG PO CPDR: DELAYED_RELEASE_CAPSULE | ORAL | 11 refills | Status: DC

## 2016-09-16 ENCOUNTER — Telehealth: Payer: Self-pay

## 2016-09-16 NOTE — Telephone Encounter (Signed)
Pt left a message on [employer] stating her Blood Sugar has been averaging 230 2 hours after eating. She is taking her medications as directed. She said this has been going on like this for awhile. Wanted to know what she should do.

## 2016-09-19 NOTE — Telephone Encounter (Signed)
She was on prednisone last month - likely contributing to the elevations.  Would see if she's willing to increase metformin XR to BID and see if tolerated without diarrhea - if diarrhea returns then back down to once daily.

## 2016-09-20 ENCOUNTER — Encounter: Payer: Self-pay | Admitting: Family Medicine

## 2016-09-20 ENCOUNTER — Ambulatory Visit (INDEPENDENT_AMBULATORY_CARE_PROVIDER_SITE_OTHER): Payer: BLUE CROSS/BLUE SHIELD | Admitting: Family Medicine

## 2016-09-20 VITALS — BP 138/78 | HR 96 | Temp 97.8°F | Wt 269.0 lb

## 2016-09-20 DIAGNOSIS — R0981 Nasal congestion: Secondary | ICD-10-CM

## 2016-09-20 DIAGNOSIS — E1165 Type 2 diabetes mellitus with hyperglycemia: Secondary | ICD-10-CM

## 2016-09-20 DIAGNOSIS — D649 Anemia, unspecified: Secondary | ICD-10-CM

## 2016-09-20 DIAGNOSIS — IMO0001 Reserved for inherently not codable concepts without codable children: Secondary | ICD-10-CM

## 2016-09-20 DIAGNOSIS — E785 Hyperlipidemia, unspecified: Secondary | ICD-10-CM

## 2016-09-20 MED ORDER — PIOGLITAZONE HCL 30 MG PO TABS: 30.0000 mg | ORAL_TABLET | Freq: Every day | ORAL | 6 refills | Status: DC

## 2016-09-20 MED ORDER — FERROUS SULFATE 325 (65 FE) MG PO TBEC: 325.0000 mg | DELAYED_RELEASE_TABLET | Freq: Every day | ORAL | Status: DC

## 2016-09-20 MED ORDER — ATORVASTATIN CALCIUM 20 MG PO TABS: 20.0000 mg | ORAL_TABLET | Freq: Every day | ORAL | 6 refills | Status: DC

## 2016-09-20 NOTE — Patient Instructions (Addendum)
Increase activity walking - this will help with sugar control. Watch added sugars.  Stop red yeast rice. Start atorvastatin (lipitor) 20mg  daily  Think about victoza daily shot for sugar.  Let's stop metformin XR for the next month - update me with diarrhea effect. Continue amaryl and tradjenta. Start actos 30mg  daily.  Return in 2.5 months for f/u visit

## 2016-09-20 NOTE — Assessment & Plan Note (Signed)
Reviewed with patient - rec start daily INS. Update if not improved.

## 2016-09-20 NOTE — Assessment & Plan Note (Signed)
Discussed healthy diet and lifestyle changes to affect sustainable weight loss  

## 2016-09-20 NOTE — Assessment & Plan Note (Signed)
LDL remains high - will transition from RYR to lipitor 20mg  daily.

## 2016-09-20 NOTE — Assessment & Plan Note (Signed)
Improved with iron replacement.  

## 2016-09-20 NOTE — Progress Notes (Signed)
Pre visit review using our clinic review tool, if applicable. No additional management support is needed unless otherwise documented below in the visit note. 

## 2016-09-20 NOTE — Progress Notes (Signed)
BP 138/78   Pulse 96   Temp 97.8 F (36.6 C) (Oral)   Wt 269 lb (122 kg)   LMP 08/29/2016   BMI 46.17 kg/m    CC: 3 mo f/u visit Subjective:    Patient ID: Lisa Curry, female    DOB: Feb 07, 1971, 46 y.o.   MRN: [id number]  HPI: KAWANZA LOUREIRO is a 46 y.o. female presenting on 09/20/2016 for Follow-up (3 MONTHS)   Missed last month's appt. See recent phone note. Pt finding elevated cbg readings over last few weeks - 230s post prandial. She was recently on prednisone course for lower back. Last visit we discussed increased walking.   DM - regularly does check sugars once daily fasting: high over last few weeks 230. Compliant with antihyperglycemic regimen which includes: metformin XR 500mg  daily, amaryl 8mg  with breakfast, tradjenta 5mg  daily. Denies low sugars or hypoglycemic symptoms. Denies paresthesias. Last diabetic eye exam 10/2015. Pneumovax: 2015. Prevnar: not due.  Lab Results  Component Value Date   HGBA1C 7.7 (H) 09/01/2016   Diabetic Foot Exam - Simple   Simple Foot Form Diabetic Foot exam was performed with the following findings:  Yes 09/20/2016 12:30 PM  Visual Inspection No deformities, no ulcerations, no other skin breakdown bilaterally:  Yes Sensation Testing Intact to touch and monofilament testing bilaterally:  Yes Pulse Check Posterior Tibialis and Dorsalis pulse intact bilaterally:  Yes Comments      Ongoing loose stools in the afternoons. No fever. No blood in stool, black tarry stools. Takes iron tablet daily.   Increasing headaches over the past month, describes pressure pain in sinuses. + nausea, with photophobia. eval at ER 08/21/2016 for bad headache - CT head negative, treated with reglan, IVF, magnesium, and toradol with good improvement.   Relevant past medical, surgical, family and social history reviewed and updated as indicated. Interim medical history since our last visit reviewed. Allergies and medications reviewed and updated. Current  Outpatient Prescriptions on File Prior to Visit  Medication Sig  . buPROPion (WELLBUTRIN) 75 MG tablet Take 1 tablet (75 mg total) by mouth 2 (two) times daily.  . Cholecalciferol (VITAMIN D3) 1000 UNITS CAPS Take 1 capsule (1,000 Units total) by mouth daily.  . clonazePAM (KLONOPIN) 0.5 MG tablet TAKE 1 TABLET BY MOUTH DAILY MAY TAKE ADDITIONAL TABLET IF NEEDED DAILY  . clotrimazole (LOTRIMIN) 1 % cream Apply 1 application topically 2 (two) times daily.  . cyclobenzaprine (FLEXERIL) 10 MG tablet Take 1 tablet (10 mg total) by mouth 2 (two) times daily as needed for muscle spasms.  Marland Kitchen dexlansoprazole (DEXILANT) 60 MG capsule TAKE 1 CAPSULE (60 MG TOTAL) BY MOUTH DAILY.  Marland Kitchen glimepiride (AMARYL) 4 MG tablet TAKE 2 TABLETS (8 MG TOTAL) BY MOUTH DAILY WITH BREAKFAST.  Marland Kitchen glucose blood test strip 1 each by Other route 2 (two) times daily as needed for other. Use to check sugar twice daily and as needed. Dx:E11.65  . ibuprofen (ADVIL,MOTRIN) 200 MG tablet Take 800 mg by mouth as needed.  . metFORMIN (GLUCOPHAGE-XR) 500 MG 24 hr tablet TAKE 1 TABLET (500 MG TOTAL) BY MOUTH DAILY WITH BREAKFAST.  . Multiple Vitamin (MULTIVITAMIN) capsule Take 1 capsule by mouth daily.  . TRADJENTA 5 MG TABS tablet TAKE 1 TABLET (5 MG TOTAL) BY MOUTH DAILY.  Marland Kitchen venlafaxine XR (EFFEXOR-XR) 75 MG 24 hr capsule TAKE 3 CAPSULES BY MOUTH DAILY WITH BREAKFAST EVERY MORNING   No current facility-administered medications on file prior to visit.  Review of Systems Per HPI unless specifically indicated in ROS section     Objective:    BP 138/78   Pulse 96   Temp 97.8 F (36.6 C) (Oral)   Wt 269 lb (122 kg)   LMP 08/29/2016   BMI 46.17 kg/m   Wt Readings from Last 3 Encounters:  09/20/16 269 lb (122 kg)  08/21/16 270 lb (122.5 kg)  08/16/16 271 lb (122.9 kg)    Physical Exam  Constitutional: She appears well-developed and well-nourished. No distress.  HENT:  Head: Normocephalic and atraumatic.  Right Ear:  External ear normal.  Left Ear: External ear normal.  Nose: Nose normal.  Mouth/Throat: Oropharynx is clear and moist. No oropharyngeal exudate.  Eyes: Conjunctivae and EOM are normal. Pupils are equal, round, and reactive to light. No scleral icterus.  Neck: Normal range of motion. Neck supple.  Cardiovascular: Normal rate, regular rhythm, normal heart sounds and intact distal pulses.   No murmur heard. Pulmonary/Chest: Effort normal and breath sounds normal. No respiratory distress. She has no wheezes. She has no rales.  Musculoskeletal: She exhibits no edema.  See HPI for foot exam if done  Lymphadenopathy:    She has no cervical adenopathy.  Skin: Skin is warm and dry. No rash noted.  Psychiatric: She has a normal mood and affect.  Nursing note and vitals reviewed.  Results for orders placed or performed in visit on 09/01/16  CBC with Differential/Platelet  Result Value Ref Range   WBC 8.9 4.0 - 10.5 K/uL   RBC 4.39 3.87 - 5.11 Mil/uL   Hemoglobin 13.2 12.0 - 15.0 g/dL   HCT 39.3 36.0 - 46.0 %   MCV 89.6 78.0 - 100.0 fl   MCHC 33.7 30.0 - 36.0 g/dL   RDW 15.2 11.5 - 15.5 %   Platelets 313.0 150.0 - 400.0 K/uL   Neutrophils Relative % 69.0 43.0 - 77.0 %   Lymphocytes Relative 27.1 12.0 - 46.0 %   Monocytes Relative 1.9 (L) 3.0 - 12.0 %   Eosinophils Relative 1.8 0.0 - 5.0 %   Basophils Relative 0.2 0.0 - 3.0 %   Neutro Abs 6.2 1.4 - 7.7 K/uL   Lymphs Abs 2.4 0.7 - 4.0 K/uL   Monocytes Absolute 0.2 0.1 - 1.0 K/uL   Eosinophils Absolute 0.2 0.0 - 0.7 K/uL   Basophils Absolute 0.0 0.0 - 0.1 K/uL  Basic metabolic panel  Result Value Ref Range   Sodium 139 135 - 145 mEq/L   Potassium 3.9 3.5 - 5.1 mEq/L   Chloride 102 96 - 112 mEq/L   CO2 30 19 - 32 mEq/L   Glucose, Bld 167 (H) 70 - 99 mg/dL   BUN 6 6 - 23 mg/dL   Creatinine, Ser 0.69 0.40 - 1.20 mg/dL   Calcium 8.8 8.4 - 10.5 mg/dL   GFR 97.19 >60.00 mL/min  Hemoglobin A1c  Result Value Ref Range   Hgb A1c MFr Bld  7.7 (H) 4.6 - 6.5 %  VITAMIN D 25 Hydroxy (Vit-D Deficiency, Fractures)  Result Value Ref Range   VITD 26.57 (L) 30.00 - 100.00 ng/mL  LDL Cholesterol, Direct  Result Value Ref Range   Direct LDL 112.0 mg/dL      Assessment & Plan:   Problem List Items Addressed This Visit    Anemia    Improved with iron replacement.       Relevant Medications   ferrous sulfate 325 (65 FE) MG EC tablet   Diabetes mellitus type  2, uncontrolled ([place]) - Primary    Deteriorated control based on recall cbg's. Anticipate combination of recent prednisone course, holiday season, and non adherence to diabetic diet and exercise regimen. Discussed with patient.  She remains concerned metformin XR is causing diarrhea - will trial 1 month off metformin and start actos 30mg  in interim. Discussed bladder cancer association, but at high doses and long term. Pt will monitor for pedal edema. No know h/o CHF.  RTC 3 mo f/u visit.  Encouraged avoiding sweet tea, rec increased walking regimen.       Relevant Medications   atorvastatin (LIPITOR) 20 MG tablet   pioglitazone (ACTOS) 30 MG tablet   HLD (hyperlipidemia)    LDL remains high - will transition from RYR to lipitor 20mg  daily.      Relevant Medications   atorvastatin (LIPITOR) 20 MG tablet   Severe obesity (BMI >= 40) (HCC)    Discussed healthy diet and lifestyle changes to affect sustainable weight loss.      Relevant Medications   pioglitazone (ACTOS) 30 MG tablet   Sinus congestion    Reviewed with patient - rec start daily INS. Update if not improved.           Follow up plan: Return in about 3 months (around 12/19/2016) for follow up visit.  Ria Bush, MD

## 2016-09-20 NOTE — Telephone Encounter (Signed)
Called to speak to patient and was advised by patient to give inoformation to her daughter [place].Jordan Hawks notified as instructed and verbalized understanding. Was advised by [place] that her mom is willing to try the change in the medication.

## 2016-09-20 NOTE — Assessment & Plan Note (Signed)
Deteriorated control based on recall cbg's. Anticipate combination of recent prednisone course, holiday season, and non adherence to diabetic diet and exercise regimen. Discussed with patient.  She remains concerned metformin XR is causing diarrhea - will trial 1 month off metformin and start actos 30mg  in interim. Discussed bladder cancer association, but at high doses and long term. Pt will monitor for pedal edema. No know h/o CHF.  RTC 3 mo f/u visit.  Encouraged avoiding sweet tea, rec increased walking regimen.

## 2016-09-23 ENCOUNTER — Other Ambulatory Visit: Payer: Self-pay | Admitting: Family Medicine

## 2016-09-28 NOTE — Telephone Encounter (Signed)
PA approved.  Pt notified.

## 2016-10-07 ENCOUNTER — Other Ambulatory Visit: Payer: Self-pay | Admitting: Family Medicine

## 2016-10-07 NOTE — Telephone Encounter (Signed)
Rx called in as directed.   

## 2016-10-07 NOTE — Telephone Encounter (Signed)
plz phone in. 

## 2016-11-04 ENCOUNTER — Other Ambulatory Visit: Payer: Self-pay | Admitting: Family Medicine

## 2016-11-18 ENCOUNTER — Ambulatory Visit (INDEPENDENT_AMBULATORY_CARE_PROVIDER_SITE_OTHER): Payer: BLUE CROSS/BLUE SHIELD | Admitting: Primary Care

## 2016-11-18 ENCOUNTER — Encounter: Payer: Self-pay | Admitting: Primary Care

## 2016-11-18 VITALS — BP 122/80 | HR 88 | Temp 98.1°F | Wt 268.8 lb

## 2016-11-18 DIAGNOSIS — J020 Streptococcal pharyngitis: Secondary | ICD-10-CM

## 2016-11-18 LAB — POCT RAPID STREP A (OFFICE): Rapid Strep A Screen: POSITIVE — AB

## 2016-11-18 MED ORDER — AMOXICILLIN 875 MG PO TABS: 875.0000 mg | ORAL_TABLET | Freq: Two times a day (BID) | ORAL | 0 refills | Status: DC

## 2016-11-18 NOTE — Progress Notes (Signed)
Pre visit review using our clinic review tool, if applicable. No additional management support is needed unless otherwise documented below in the visit note. 

## 2016-11-18 NOTE — Progress Notes (Signed)
Subjective:    Patient ID: Lisa Curry, female    DOB: Oct 11, 1970, 46 y.o.   MRN: [id number]  HPI  Lisa Curry is a 46 year old female with a history of GERD and strep throat who presents today with a chief complaint of sore throat. She also reports swelling to her posterior pharynx, bilateral ear pain, headaches, mild cough. Her symptoms began yesterday. She denies fevers. She works as a Oceanographer. She's taken Ibuprofen 800 mg with some improvement. She did have her flu shot in November 2017.   Review of Systems  Constitutional: Positive for fatigue. Negative for chills and fever.  HENT: Positive for ear pain and sore throat. Negative for congestion and sinus pressure.   Respiratory: Positive for cough. Negative for shortness of breath.   Cardiovascular: Negative for chest pain.       Past Medical History:  Diagnosis Date  . Allergy   . Barrett esophagus    Lisa Curry, have not received EGD report  . Depression with anxiety   . Diabetes mellitus without complication ([place])    DSME 04/2014  . Gastric polyp 05/2012   h/o EGD  . GERD (gastroesophageal reflux disease)   . History of Helicobacter pylori infection   . HLD (hyperlipidemia)   . Hyperlipidemia   . Vitamin D deficiency      Social History   Social History  . Marital status: Married    Spouse name: N/A  . Number of children: N/A  . Years of education: N/A   Occupational History  . Not on file.   Social History Main Topics  . Smoking status: Never Smoker  . Smokeless tobacco: Never Used  . Alcohol use No  . Drug use: No  . Sexual activity: Not on file   Other Topics Concern  . Not on file   Social History Narrative   Lives with husband and 2 children, 3 dogs, 2 cats and 2 duck   Occ: special ed Oceanographer.   Activity: no regular exercise   Diet: good water, fruits/vegetables daily    Past Surgical History:  Procedure Laterality Date  . BACK SURGERY  03/1995  . CESAREAN SECTION  6/95,  4/98  . DILATION AND CURETTAGE OF UTERUS  2011  . ESOPHAGOGASTRODUODENOSCOPY  2012?   Lisa Curry at [facility]  . NASAL SINUS SURGERY  03/2001    Family History  Problem Relation Age of Onset  . Breast cancer Maternal Grandmother 40  . Heart disease Paternal Grandfather   . Supraventricular tachycardia Sister   . Other Sister     Postural Orthostatic Tachycardia Syndrome  . Other Maternal Aunt     hysterectomy for irregular bleed with abnormal pap    Allergies  Allergen Reactions  . Biaxin [Clarithromycin] Hives  . Metformin And Related Diarrhea    Current Outpatient Prescriptions on File Prior to Visit  Medication Sig Dispense Refill  . atorvastatin (LIPITOR) 20 MG tablet Take 1 tablet (20 mg total) by mouth daily. 30 tablet 6  . buPROPion (WELLBUTRIN) 75 MG tablet Take 1 tablet (75 mg total) by mouth 2 (two) times daily. 60 tablet 11  . Cholecalciferol (VITAMIN D3) 1000 UNITS CAPS Take 1 capsule (1,000 Units total) by mouth daily. 30 capsule   . clonazePAM (KLONOPIN) 0.5 MG tablet TAKE 1 TABLET BY MOUTH DAILY. MAY TAKE 1 ADDITIONAL TABLET DAILY AS NEEDED 60 tablet 1  . clotrimazole (LOTRIMIN) 1 % cream Apply 1 application topically 2 (two)  times daily. 60 g 0  . cyclobenzaprine (FLEXERIL) 10 MG tablet Take 1 tablet (10 mg total) by mouth 2 (two) times daily as needed for muscle spasms. 40 tablet 1  . dexlansoprazole (DEXILANT) 60 MG capsule TAKE 1 CAPSULE (60 MG TOTAL) BY MOUTH DAILY. 30 capsule 11  . ferrous sulfate 325 (65 FE) MG EC tablet Take 1 tablet (325 mg total) by mouth daily with breakfast.    . glimepiride (AMARYL) 4 MG tablet TAKE 2 TABLETS (8 MG TOTAL) BY MOUTH DAILY WITH BREAKFAST. 60 tablet 6  . glucose blood test strip 1 each by Other route 2 (two) times daily as needed for other. Use to check sugar twice daily and as needed. Dx:E11.65 180 each 3  . ibuprofen (ADVIL,MOTRIN) 200 MG tablet Take 800 mg by mouth as needed.    . Multiple Vitamin (MULTIVITAMIN)  capsule Take 1 capsule by mouth daily.    . pioglitazone (ACTOS) 30 MG tablet Take 1 tablet (30 mg total) by mouth daily. 30 tablet 6  . TRADJENTA 5 MG TABS tablet TAKE 1 TABLET (5 MG TOTAL) BY MOUTH DAILY. 30 tablet 6  . venlafaxine XR (EFFEXOR-XR) 75 MG 24 hr capsule TAKE 3 CAPSULES BY MOUTH DAILY WITH BREAKFAST EVERY MORNING 90 capsule 6   No current facility-administered medications on file prior to visit.     BP 122/80   Pulse 88   Temp 98.1 F (36.7 C) (Oral)   Wt 268 lb 12.8 oz (121.9 kg)   LMP 11/01/2016   SpO2 98%   BMI 46.14 kg/m    Objective:   Physical Exam  Constitutional: She appears well-nourished.  HENT:  Right Ear: Tympanic membrane and ear canal normal.  Left Ear: Tympanic membrane and ear canal normal.  Nose: Right sinus exhibits no maxillary sinus tenderness and no frontal sinus tenderness. Left sinus exhibits no maxillary sinus tenderness and no frontal sinus tenderness.  Mouth/Throat: Posterior oropharyngeal erythema present.  Eyes: Conjunctivae are normal.  Neck: Neck supple.  Cardiovascular: Normal rate and regular rhythm.   Pulmonary/Chest: Effort normal and breath sounds normal. She has no wheezes. She has no rales.  Lymphadenopathy:    She has no cervical adenopathy.  Skin: Skin is warm.  Mild sweating during exam          Assessment & Plan:  URI:  Sore throat, headaches, mild cough x 24 hours. Some improvement with Ibuprofen. Exam today mostly unremarkable. She looks tired but not ill. She is sweating, the room is warm today. Given history of strep, will test. Rapid Strep: Positive Rx for Amoxicillin course sent to pharmacy. Fluids, rest, Ibuprofen, warm salt gargles. Return precautions provided.  Lisa Flow, NP

## 2016-11-18 NOTE — Patient Instructions (Addendum)
You have strep throat.  Start amoxicillin antibiotics. Take 1 tablet by mouth twice daily for 7 days.  Continue Ibuprofen 800 mg three times daily. Try warm, salt gargles three times daily, chloraseptic spray for sore throat.  It was a pleasure meeting you!

## 2016-11-18 NOTE — Addendum Note (Signed)
Addended by: Jacqualin Combes on: 11/18/2016 11:28 AM   Modules accepted: Orders

## 2016-12-03 ENCOUNTER — Other Ambulatory Visit: Payer: Self-pay | Admitting: Family Medicine

## 2016-12-04 ENCOUNTER — Other Ambulatory Visit: Payer: Self-pay | Admitting: Family Medicine

## 2016-12-04 DIAGNOSIS — IMO0001 Reserved for inherently not codable concepts without codable children: Secondary | ICD-10-CM

## 2016-12-04 DIAGNOSIS — E1165 Type 2 diabetes mellitus with hyperglycemia: Principal | ICD-10-CM

## 2016-12-07 ENCOUNTER — Other Ambulatory Visit: Payer: BLUE CROSS/BLUE SHIELD

## 2016-12-12 ENCOUNTER — Encounter: Payer: Self-pay | Admitting: Family Medicine

## 2016-12-12 NOTE — Telephone Encounter (Signed)
To PCP

## 2016-12-14 ENCOUNTER — Other Ambulatory Visit (INDEPENDENT_AMBULATORY_CARE_PROVIDER_SITE_OTHER): Payer: BLUE CROSS/BLUE SHIELD

## 2016-12-14 ENCOUNTER — Other Ambulatory Visit: Payer: BLUE CROSS/BLUE SHIELD

## 2016-12-14 DIAGNOSIS — IMO0001 Reserved for inherently not codable concepts without codable children: Secondary | ICD-10-CM

## 2016-12-14 DIAGNOSIS — E1165 Type 2 diabetes mellitus with hyperglycemia: Secondary | ICD-10-CM

## 2016-12-14 LAB — LDL CHOLESTEROL, DIRECT: Direct LDL: 75 mg/dL

## 2016-12-14 LAB — HEMOGLOBIN A1C: Hgb A1c MFr Bld: 7.2 % — ABNORMAL HIGH (ref 4.6–6.5)

## 2016-12-20 ENCOUNTER — Encounter: Payer: Self-pay | Admitting: Family Medicine

## 2016-12-20 ENCOUNTER — Ambulatory Visit (INDEPENDENT_AMBULATORY_CARE_PROVIDER_SITE_OTHER): Payer: BLUE CROSS/BLUE SHIELD | Admitting: Family Medicine

## 2016-12-20 VITALS — BP 142/80 | HR 89 | Temp 98.9°F | Wt 271.5 lb

## 2016-12-20 DIAGNOSIS — E785 Hyperlipidemia, unspecified: Secondary | ICD-10-CM | POA: Diagnosis not present

## 2016-12-20 DIAGNOSIS — E1165 Type 2 diabetes mellitus with hyperglycemia: Secondary | ICD-10-CM

## 2016-12-20 DIAGNOSIS — IMO0001 Reserved for inherently not codable concepts without codable children: Secondary | ICD-10-CM

## 2016-12-20 MED ORDER — GLIMEPIRIDE 4 MG PO TABS: 4.0000 mg | ORAL_TABLET | Freq: Two times a day (BID) | ORAL | 6 refills | Status: DC

## 2016-12-20 NOTE — Progress Notes (Signed)
Pre visit review using our clinic review tool, if applicable. No additional management support is needed unless otherwise documented below in the visit note. 

## 2016-12-20 NOTE — Progress Notes (Signed)
BP (!) 142/80 (BP Location: Right Arm, Patient Position: Sitting, Cuff Size: Large)   Pulse 89   Temp 98.9 F (37.2 C) (Oral)   Wt 271 lb 8 oz (123.2 kg)   LMP 12/02/2016   SpO2 98%   BMI 46.60 kg/m    CC: 3 mo f/u visit Subjective:    Patient ID: Lisa Curry, female    DOB: [birth year], 46 y.o.   MRN: [id number]  HPI: Lisa Curry is a 46 y.o. female presenting on 12/20/2016 for Follow-up (3 month f/u)   R earache and spots on tonsils. h/o strep 1 month ago treated with 7d amox course.   DM - regularly does check sugars once daily fasting and brings log - fasting 130-180, post dinner 150-170s. Compliant with antihyperglycemic regimen which includes: actos 30mg  daily, amaryl 8mg  with breakfast, and tradjenta 5mg  daily. Last visit we stopped metformin XR due to ongoing GI upset. Denies low sugars or hypoglycemic symptoms. Denies paresthesias. Last diabetic eye exam 10/2015. Pneumovax: 2015. Prevnar: not due.  Lab Results  Component Value Date   HGBA1C 7.2 (H) 12/14/2016   Diabetic Foot Exam - Simple   No data filed       HLD - last visit we transitioned from RYR to lipitor 20mg  daily, tolerating well without myalgias.   Relevant past medical, surgical, family and social history reviewed and updated as indicated. Interim medical history since our last visit reviewed. Allergies and medications reviewed and updated. Outpatient Medications Prior to Visit  Medication Sig Dispense Refill  . atorvastatin (LIPITOR) 20 MG tablet Take 1 tablet (20 mg total) by mouth daily. 30 tablet 6  . buPROPion (WELLBUTRIN) 75 MG tablet Take 1 tablet (75 mg total) by mouth 2 (two) times daily. 60 tablet 11  . Cholecalciferol (VITAMIN D3) 1000 UNITS CAPS Take 1 capsule (1,000 Units total) by mouth daily. 30 capsule   . clonazePAM (KLONOPIN) 0.5 MG tablet TAKE 1 TABLET BY MOUTH DAILY. MAY TAKE 1 ADDITIONAL TABLET DAILY AS NEEDED 60 tablet 1  . clotrimazole (LOTRIMIN) 1 % cream Apply 1 application  topically 2 (two) times daily. 60 g 0  . cyclobenzaprine (FLEXERIL) 10 MG tablet Take 1 tablet (10 mg total) by mouth 2 (two) times daily as needed for muscle spasms. 40 tablet 1  . dexlansoprazole (DEXILANT) 60 MG capsule TAKE 1 CAPSULE (60 MG TOTAL) BY MOUTH DAILY. 30 capsule 11  . ferrous sulfate 325 (65 FE) MG EC tablet Take 1 tablet (325 mg total) by mouth daily with breakfast.    . glucose blood (ONE TOUCH ULTRA TEST) test strip Use to check sugar twice daily and as needed. Dx: E11.65 200 each 3  . ibuprofen (ADVIL,MOTRIN) 200 MG tablet Take 800 mg by mouth as needed.    . Multiple Vitamin (MULTIVITAMIN) capsule Take 1 capsule by mouth daily.    . pioglitazone (ACTOS) 30 MG tablet Take 1 tablet (30 mg total) by mouth daily. 30 tablet 6  . TRADJENTA 5 MG TABS tablet TAKE 1 TABLET (5 MG TOTAL) BY MOUTH DAILY. 30 tablet 6  . venlafaxine XR (EFFEXOR-XR) 75 MG 24 hr capsule TAKE 3 CAPSULES BY MOUTH DAILY WITH BREAKFAST EVERY MORNING 90 capsule 6  . amoxicillin (AMOXIL) 875 MG tablet Take 1 tablet (875 mg total) by mouth 2 (two) times daily. 14 tablet 0  . glimepiride (AMARYL) 4 MG tablet TAKE 2 TABLETS (8 MG TOTAL) BY MOUTH DAILY WITH BREAKFAST. 60 tablet 6   No facility-administered  medications prior to visit.      Per HPI unless specifically indicated in ROS section below Review of Systems     Objective:    BP (!) 142/80 (BP Location: Right Arm, Patient Position: Sitting, Cuff Size: Large)   Pulse 89   Temp 98.9 F (37.2 C) (Oral)   Wt 271 lb 8 oz (123.2 kg)   LMP 12/02/2016   SpO2 98%   BMI 46.60 kg/m   Wt Readings from Last 3 Encounters:  12/20/16 271 lb 8 oz (123.2 kg)  11/18/16 268 lb 12.8 oz (121.9 kg)  09/20/16 269 lb (122 kg)    Physical Exam  Constitutional: She appears well-developed and well-nourished. No distress.  HENT:  Head: Normocephalic and atraumatic.  Right Ear: Hearing, tympanic membrane, external ear and ear canal normal.  Left Ear: Hearing, tympanic  membrane, external ear and ear canal normal.  Nose: Nose normal. No mucosal edema or rhinorrhea. Right sinus exhibits no maxillary sinus tenderness and no frontal sinus tenderness. Left sinus exhibits no maxillary sinus tenderness and no frontal sinus tenderness.  Mouth/Throat: Uvula is midline, oropharynx is clear and moist and mucous membranes are normal. No oropharyngeal exudate, posterior oropharyngeal edema, posterior oropharyngeal erythema or tonsillar abscesses.  Eyes: Conjunctivae and EOM are normal. Pupils are equal, round, and reactive to light. No scleral icterus.  Neck: Normal range of motion. Neck supple.  Cardiovascular: Normal rate, regular rhythm, normal heart sounds and intact distal pulses.   No murmur heard. Pulmonary/Chest: Effort normal and breath sounds normal. No respiratory distress. She has no wheezes. She has no rales.  Musculoskeletal: She exhibits no edema.  See HPI for foot exam if done  Lymphadenopathy:    She has no cervical adenopathy.  Skin: Skin is warm and dry. No rash noted.  Psychiatric: She has a normal mood and affect.  Nursing note and vitals reviewed.  Results for orders placed or performed in visit on 12/14/16  Hemoglobin A1c  Result Value Ref Range   Hgb A1c MFr Bld 7.2 (H) 4.6 - 6.5 %  LDL Cholesterol, Direct  Result Value Ref Range   Direct LDL 75.0 mg/dL      Assessment & Plan:   Problem List Items Addressed This Visit    Diabetes mellitus type 2, uncontrolled ([place]) - Primary    Chronic. Some improvement noted today - however A1c still above goal. Log showing persistent impaired fasting glycemic control. Will split glimepiride to 4mg  bid, continue other medicines as up to now. Pt agrees with plan. rec schedule eye exam.       Relevant Medications   glimepiride (AMARYL) 4 MG tablet   HLD (hyperlipidemia)    Chronic. Improved readings on lipitor - continue.           Follow up plan: Return in about 4 months (around 04/21/2017) for  annual exam, prior fasting for blood work.  Ria Bush, MD

## 2016-12-20 NOTE — Patient Instructions (Addendum)
Schedule eye exam as you're due.  Change glimepiride to 4mg  twice daily with meals.  Try phillips colon health to help regularize stools. If no better, ok to try magnesium supplement.  Return as needed or in 4 months for physical.

## 2016-12-20 NOTE — Assessment & Plan Note (Signed)
Chronic. Some improvement noted today - however A1c still above goal. Log showing persistent impaired fasting glycemic control. Will split glimepiride to 4mg  bid, continue other medicines as up to now. Pt agrees with plan. rec schedule eye exam.

## 2016-12-20 NOTE — Assessment & Plan Note (Signed)
Chronic. Improved readings on lipitor - continue.

## 2017-01-30 ENCOUNTER — Telehealth: Payer: Self-pay | Admitting: Family Medicine

## 2017-01-30 NOTE — Telephone Encounter (Signed)
Patient Name: Lisa Curry  DOB: 11/18/1970    Initial Comment Caller states she pulled a muscle in her back. Can't move very well.    Nurse Assessment  Nurse: Raphael Gibney, RN, Vanita Ingles Date/Time (Eastern Time): 01/30/2017 1:11:47 PM  Confirm and document reason for call. If symptomatic, describe symptoms. ---Caller states she thinks she pulled a muscle in her lower left back. Having trouble moving due to pain. Pain gets worse when she moves. Can't stand up straight. Pain level 8 with movement.  Does the patient have any new or worsening symptoms? ---Yes  Will a triage be completed? ---Yes  Related visit to physician within the last 2 weeks? ---No  Does the PT have any chronic conditions? (i.e. diabetes, asthma, etc.) ---No  Is the patient pregnant or possibly pregnant? (Ask all females between the ages of 74-55) ---No  Is this a behavioral health or substance abuse call? ---No     Guidelines    Guideline Title Affirmed Question Affirmed Notes  Back Pain [1] SEVERE back pain (e.g., excruciating, unable to do any normal activities) AND [2] not improved 2 hours after pain medicine    Final Disposition User   See Physician within 4 Hours (or PCP triage) Raphael Gibney, RN, Vera    Comments  no appts available at [facility] within 4 hrs. Please call pt back regarding appt.  pt does not want to go to urgent care.  Pt has taken OTC medication and flexeril 5 mg and it is not helping pain.   Referrals  GO TO FACILITY REFUSED   Disagree/Comply: Disagree  Disagree/Comply Reason: Disagree with instructions

## 2017-01-30 NOTE — Telephone Encounter (Signed)
I spoke with pt and no available appts at [facility], [place] or [place]; pt did not want to drive any further. Pt scheduled appt 01/31/17 with Dr Diona Browner; if condition worsened prior to appt pt would go to [facility].FYI to Dr Diona Browner.

## 2017-01-31 ENCOUNTER — Encounter: Payer: Self-pay | Admitting: Family Medicine

## 2017-01-31 ENCOUNTER — Ambulatory Visit (INDEPENDENT_AMBULATORY_CARE_PROVIDER_SITE_OTHER): Payer: BLUE CROSS/BLUE SHIELD | Admitting: Family Medicine

## 2017-01-31 VITALS — BP 122/84 | HR 90 | Temp 98.5°F | Ht 64.0 in | Wt 273.5 lb

## 2017-01-31 DIAGNOSIS — M545 Low back pain, unspecified: Secondary | ICD-10-CM

## 2017-01-31 MED ORDER — DICLOFENAC SODIUM 75 MG PO TBEC: 75.0000 mg | DELAYED_RELEASE_TABLET | Freq: Two times a day (BID) | ORAL | 0 refills | Status: DC

## 2017-01-31 MED ORDER — CYCLOBENZAPRINE HCL 10 MG PO TABS: 10.0000 mg | ORAL_TABLET | Freq: Every day | ORAL | 0 refills | Status: DC

## 2017-01-31 NOTE — Progress Notes (Signed)
Subjective:    Patient ID: Lisa Curry, female    DOB: [birth year], 46 y.o.   MRN: [id number]  HPI  46 year old female  Pt of Dr. Darnell Level presents with new onset pain in low back.She reports she twisted her low back while in shower yesterday AM. No fall.  Sudden onset sharp pain in left low back pain.  Pain over the day progressed given she had to lift and work during the day. Radiation of pain to left buttock.  No radiation to rest of leg. Pain is worse with movement leaning back, sitting for a while causes stiffness. Better with lying flat.  No weakness, no numbness.  No incontinence, no perineal numbness.  No dysuria, no fever.   She has tried 5-10 mg flexeril and 600 mg ibuprofen... Helped some.  Also at bed had hydrocodone .Marland Kitchen Tried this and was able to sleep.   Hx of DM  1996 partial discectomy Since then off and on low back pain from DDD  Lab Results  Component Value Date   HGBA1C 7.2 (H) 12/14/2016     Review of Systems  Constitutional: Negative for fatigue and fever.  HENT: Negative for ear pain.   Eyes: Negative for pain.  Respiratory: Negative for chest tightness and shortness of breath.   Cardiovascular: Negative for chest pain, palpitations and leg swelling.  Gastrointestinal: Negative for abdominal pain.  Genitourinary: Negative for dysuria.       Objective:   Physical Exam  Constitutional: Vital signs are normal. She appears well-developed and well-nourished. She is cooperative.  Non-toxic appearance. She does not appear ill. No distress.  Morbidly obese  HENT:  Head: Normocephalic.  Right Ear: Hearing, tympanic membrane, external ear and ear canal normal. Tympanic membrane is not erythematous, not retracted and not bulging.  Left Ear: Hearing, tympanic membrane, external ear and ear canal normal. Tympanic membrane is not erythematous, not retracted and not bulging.  Nose: No mucosal edema or rhinorrhea. Right sinus exhibits no maxillary sinus tenderness and  no frontal sinus tenderness. Left sinus exhibits no maxillary sinus tenderness and no frontal sinus tenderness.  Mouth/Throat: Uvula is midline, oropharynx is clear and moist and mucous membranes are normal.  Eyes: Conjunctivae, EOM and lids are normal. Pupils are equal, round, and reactive to light. Lids are everted and swept, no foreign bodies found.  Neck: Trachea normal and normal range of motion. Neck supple. Carotid bruit is not present. No thyroid mass and no thyromegaly present.  Cardiovascular: Normal rate, regular rhythm, S1 normal, S2 normal, normal heart sounds, intact distal pulses and normal pulses.  Exam reveals no gallop and no friction rub.   No murmur heard. Pulmonary/Chest: Effort normal and breath sounds normal. No tachypnea. No respiratory distress. She has no decreased breath sounds. She has no wheezes. She has no rhonchi. She has no rales.  Abdominal: Soft. Normal appearance and bowel sounds are normal. There is no tenderness.  Musculoskeletal:       Thoracic back: Normal.       Lumbar back: She exhibits decreased range of motion and tenderness. She exhibits no bony tenderness.  Neg SLR, neg fabers  Neurological: She is alert. She has normal strength. No cranial nerve deficit or sensory deficit. She displays a negative Romberg sign. Coordination and gait normal.  Skin: Skin is warm, dry and intact. No rash noted.  Psychiatric: Her speech is normal and behavior is normal. Judgment and thought content normal. Her mood appears not anxious. Cognition and  memory are normal. She does not exhibit a depressed mood.          Assessment & Plan:

## 2017-01-31 NOTE — Patient Instructions (Signed)
Start diclofenac twice daily for pain and inflammation.. Hold ibuprofen and aleve.  Use muscle relaxant at night. Start low back stretches and heat on low back.  If not improving in 2 weeks follow up with Dr. Jacinto Reap or PCP.

## 2017-01-31 NOTE — Assessment & Plan Note (Signed)
Heat, NSAIDs, muscle relaxant and home PT.  No s/s of fracutre.Marland Kitchen No x-ray indicated.  No red flags.

## 2017-02-13 ENCOUNTER — Other Ambulatory Visit: Payer: Self-pay | Admitting: Family Medicine

## 2017-02-14 NOTE — Telephone Encounter (Signed)
plz phone in. 

## 2017-02-14 NOTE — Telephone Encounter (Signed)
Medication phoned to pharmacy.  

## 2017-02-14 NOTE — Telephone Encounter (Signed)
Received refill request electronically Last refill 10/07/16 #60/1 Last office visit 01/31/17/acute

## 2017-03-20 ENCOUNTER — Ambulatory Visit (INDEPENDENT_AMBULATORY_CARE_PROVIDER_SITE_OTHER): Payer: BLUE CROSS/BLUE SHIELD | Admitting: Family Medicine

## 2017-03-20 ENCOUNTER — Encounter: Payer: Self-pay | Admitting: Family Medicine

## 2017-03-20 VITALS — BP 108/70 | HR 90 | Temp 98.0°F | Wt 279.0 lb

## 2017-03-20 DIAGNOSIS — M545 Low back pain, unspecified: Secondary | ICD-10-CM

## 2017-03-20 DIAGNOSIS — M62830 Muscle spasm of back: Secondary | ICD-10-CM

## 2017-03-20 DIAGNOSIS — S29012A Strain of muscle and tendon of back wall of thorax, initial encounter: Secondary | ICD-10-CM | POA: Insufficient documentation

## 2017-03-20 LAB — POC URINALSYSI DIPSTICK (AUTOMATED)
Bilirubin, UA: NEGATIVE
Blood, UA: NEGATIVE
Glucose, UA: NEGATIVE
Ketones, UA: NEGATIVE
Leukocytes, UA: NEGATIVE
Nitrite, UA: NEGATIVE
Protein, UA: NEGATIVE
Spec Grav, UA: 1.015 (ref 1.010–1.025)
Urobilinogen, UA: 0.2 E.U./dL
pH, UA: 6 (ref 5.0–8.0)

## 2017-03-20 MED ORDER — METHOCARBAMOL 500 MG PO TABS: 500.0000 mg | ORAL_TABLET | Freq: Three times a day (TID) | ORAL | 1 refills | Status: DC | PRN

## 2017-03-20 NOTE — Assessment & Plan Note (Signed)
Story/exam consistent with this, reproducible thoracic muscle pain. Supportive care as per instructions. Stop flexeril, start robaxin. Continue NSAID, add heating pad, rec gentle stretching. Update if not improving with treatment.

## 2017-03-20 NOTE — Patient Instructions (Signed)
I think you have thoracic back strain. Treat with heating pad, massage, gentle stretches, ok to continue advil. Stop flexeril, start robaxin muscle relaxant 1-2 tablets as needed (caution with sedation).  Let us know if not improving.

## 2017-03-20 NOTE — Progress Notes (Signed)
BP 108/70 (BP Location: Left Arm, Patient Position: Sitting, Cuff Size: Large)   Pulse 90   Temp 98 F (36.7 C) (Oral)   Wt 279 lb (126.6 kg)   SpO2 98%   BMI 47.89 kg/m    CC: pain R back Subjective:    Patient ID: [name], female    DOB: [birth year], 46 y.o.   MRN: [id number]  HPI: MARELLY WEHRMAN is a 46 y.o. female presenting on 03/20/2017 for Back Pain (Right side back pain started several weeks ago. No injury. Has tried Advil diclifenac, Flexeril, ice all without relief. )   Here with husband Ronalee Belts.   Several week (4-6) h/o R flank pain described as muscle spasm. Denies inciting trauma/injury or falls. Possible frequency early on in course of illness. Possible sxs started after actos initiation.   No fevers/chills, new rashes, abd pain or nausea. Denies blood in urine or stool. Denies back pain, radiation down legs.   No improvement after ice, flexeril, diclofenac or advil. She has also tried topical essential oils.   Relevant past medical, surgical, family and social history reviewed and updated as indicated. Interim medical history since our last visit reviewed. Allergies and medications reviewed and updated. Outpatient Medications Prior to Visit  Medication Sig Dispense Refill  . atorvastatin (LIPITOR) 20 MG tablet Take 1 tablet (20 mg total) by mouth daily. 30 tablet 6  . buPROPion (WELLBUTRIN) 75 MG tablet Take 1 tablet (75 mg total) by mouth 2 (two) times daily. 60 tablet 11  . Cholecalciferol (VITAMIN D3) 1000 UNITS CAPS Take 1 capsule (1,000 Units total) by mouth daily. 30 capsule   . clonazePAM (KLONOPIN) 0.5 MG tablet TAKE 1 TABLET BY MOUTH DAILY. MAY TAKE 1 ADDITIONAL TABLET DAILY IF NEEDED 60 tablet 1  . clotrimazole (LOTRIMIN) 1 % cream Apply 1 application topically 2 (two) times daily. 60 g 0  . dexlansoprazole (DEXILANT) 60 MG capsule TAKE 1 CAPSULE (60 MG TOTAL) BY MOUTH DAILY. 30 capsule 11  . diclofenac (VOLTAREN) 75 MG EC tablet Take 1 tablet (75 mg total)  by mouth 2 (two) times daily. 30 tablet 0  . glimepiride (AMARYL) 4 MG tablet Take 1 tablet (4 mg total) by mouth 2 (two) times daily. Take with meals 60 tablet 6  . glucose blood (ONE TOUCH ULTRA TEST) test strip Use to check sugar twice daily and as needed. Dx: E11.65 200 each 3  . Multiple Vitamin (MULTIVITAMIN) capsule Take 1 capsule by mouth daily.    . pioglitazone (ACTOS) 30 MG tablet Take 1 tablet (30 mg total) by mouth daily. 30 tablet 6  . TRADJENTA 5 MG TABS tablet TAKE 1 TABLET (5 MG TOTAL) BY MOUTH DAILY. 30 tablet 6  . venlafaxine XR (EFFEXOR-XR) 75 MG 24 hr capsule TAKE 3 CAPSULES BY MOUTH DAILY WITH BREAKFAST EVERY MORNING 90 capsule 6  . cyclobenzaprine (FLEXERIL) 10 MG tablet Take 1 tablet (10 mg total) by mouth at bedtime. 15 tablet 0  . ferrous sulfate 325 (65 FE) MG EC tablet Take 1 tablet (325 mg total) by mouth daily with breakfast.     No facility-administered medications prior to visit.      Per HPI unless specifically indicated in ROS section below Review of Systems     Objective:    BP 108/70 (BP Location: Left Arm, Patient Position: Sitting, Cuff Size: Large)   Pulse 90   Temp 98 F (36.7 C) (Oral)   Wt 279 lb (126.6 kg)  SpO2 98%   BMI 47.89 kg/m   Wt Readings from Last 3 Encounters:  03/20/17 279 lb (126.6 kg)  01/31/17 273 lb 8 oz (124.1 kg)  12/20/16 271 lb 8 oz (123.2 kg)    Physical Exam  Constitutional: She appears well-developed and well-nourished. No distress.  Abdominal: Soft. Normal appearance and bowel sounds are normal. She exhibits no distension and no mass. There is no hepatosplenomegaly. There is no tenderness. There is no rigidity, no rebound, no guarding, no CVA tenderness and negative Murphy's sign.  Musculoskeletal: Normal range of motion.  No midline spine tenderness R lower thoracic paraspinous mm reproducible tenderness/spasm/tightness   Nursing note and vitals reviewed.  Results for orders placed or performed in visit on  03/20/17  POCT Urinalysis Dipstick (Automated)  Result Value Ref Range   Color, UA light yellow    Clarity, UA clear    Glucose, UA negative    Bilirubin, UA negative    Ketones, UA negative    Spec Grav, UA 1.015 1.010 - 1.025   Blood, UA negative    pH, UA 6.0 5.0 - 8.0   Protein, UA negative    Urobilinogen, UA 0.2 0.2 or 1.0 E.U./dL   Nitrite, UA negative    Leukocytes, UA Negative Negative      Assessment & Plan:   Problem List Items Addressed This Visit    Spasm of thoracic back muscle - Primary    Story/exam consistent with this, reproducible thoracic muscle pain. Supportive care as per instructions. Stop flexeril, start robaxin. Continue NSAID, add heating pad, rec gentle stretching. Update if not improving with treatment.        Other Visit Diagnoses    Low back pain, episodic       Relevant Medications   methocarbamol (ROBAXIN) 500 MG tablet   Other Relevant Orders   POCT Urinalysis Dipstick (Automated) (Completed)       Follow up plan: Return if symptoms worsen or fail to improve.  Ria Bush, MD

## 2017-03-21 ENCOUNTER — Other Ambulatory Visit: Payer: Self-pay | Admitting: Family Medicine

## 2017-03-21 NOTE — Telephone Encounter (Signed)
Pt left v/m; pt got notice from pharmacy that they do not have robaxin in the pharmacy and pt wants to continue with the flexeril. Pt was seen 03/20/17.pt thinks back is better on the flexeril 10 mg and the heating pad.

## 2017-03-22 ENCOUNTER — Other Ambulatory Visit: Payer: Self-pay | Admitting: Family Medicine

## 2017-03-22 MED ORDER — CYCLOBENZAPRINE HCL 10 MG PO TABS: 10.0000 mg | ORAL_TABLET | Freq: Two times a day (BID) | ORAL | 0 refills | Status: DC | PRN

## 2017-03-22 NOTE — Telephone Encounter (Signed)
Flexeril refilled.

## 2017-04-15 ENCOUNTER — Other Ambulatory Visit: Payer: Self-pay | Admitting: Family Medicine

## 2017-04-15 DIAGNOSIS — E559 Vitamin D deficiency, unspecified: Secondary | ICD-10-CM

## 2017-04-15 DIAGNOSIS — IMO0001 Reserved for inherently not codable concepts without codable children: Secondary | ICD-10-CM

## 2017-04-15 DIAGNOSIS — E1165 Type 2 diabetes mellitus with hyperglycemia: Principal | ICD-10-CM

## 2017-04-15 DIAGNOSIS — E785 Hyperlipidemia, unspecified: Secondary | ICD-10-CM

## 2017-04-18 ENCOUNTER — Other Ambulatory Visit (INDEPENDENT_AMBULATORY_CARE_PROVIDER_SITE_OTHER): Payer: BLUE CROSS/BLUE SHIELD

## 2017-04-18 DIAGNOSIS — E559 Vitamin D deficiency, unspecified: Secondary | ICD-10-CM | POA: Diagnosis not present

## 2017-04-18 DIAGNOSIS — R7989 Other specified abnormal findings of blood chemistry: Secondary | ICD-10-CM | POA: Diagnosis not present

## 2017-04-18 DIAGNOSIS — E1165 Type 2 diabetes mellitus with hyperglycemia: Secondary | ICD-10-CM | POA: Diagnosis not present

## 2017-04-18 DIAGNOSIS — IMO0001 Reserved for inherently not codable concepts without codable children: Secondary | ICD-10-CM

## 2017-04-18 DIAGNOSIS — E785 Hyperlipidemia, unspecified: Secondary | ICD-10-CM

## 2017-04-18 LAB — HEMOGLOBIN A1C: Hgb A1c MFr Bld: 6.8 % — ABNORMAL HIGH (ref 4.6–6.5)

## 2017-04-18 LAB — COMPREHENSIVE METABOLIC PANEL
ALT: 10 U/L (ref 0–35)
AST: 13 U/L (ref 0–37)
Albumin: 3.5 g/dL (ref 3.5–5.2)
Alkaline Phosphatase: 84 U/L (ref 39–117)
BUN: 10 mg/dL (ref 6–23)
CO2: 29 mEq/L (ref 19–32)
Calcium: 8.5 mg/dL (ref 8.4–10.5)
Chloride: 100 mEq/L (ref 96–112)
Creatinine, Ser: 0.77 mg/dL (ref 0.40–1.20)
GFR: 85.4 mL/min (ref >60.00)
Glucose, Bld: 141 mg/dL — ABNORMAL HIGH (ref 70–99)
Potassium: 3.6 mEq/L (ref 3.5–5.1)
Sodium: 136 mEq/L (ref 135–145)
Total Bilirubin: 0.3 mg/dL (ref 0.2–1.2)
Total Protein: 6.3 g/dL (ref 6.0–8.3)

## 2017-04-18 LAB — MICROALBUMIN / CREATININE URINE RATIO
Creatinine,U: 212.2 mg/dL
Microalb Creat Ratio: 0.6 mg/g (ref 0.0–30.0)
Microalb, Ur: 1.2 mg/dL (ref 0.0–1.9)

## 2017-04-18 LAB — LIPID PANEL
Cholesterol: 137 mg/dL (ref 0–200)
HDL: 42.8 mg/dL (ref >39.00)
NonHDL: 94.65
Total CHOL/HDL Ratio: 3
Triglycerides: 220 mg/dL — ABNORMAL HIGH (ref 0.0–149.0)
VLDL: 44 mg/dL — ABNORMAL HIGH (ref 0.0–40.0)

## 2017-04-18 LAB — LDL CHOLESTEROL, DIRECT: Direct LDL: 73 mg/dL

## 2017-04-18 LAB — VITAMIN D 25 HYDROXY (VIT D DEFICIENCY, FRACTURES): VITD: 20.56 ng/mL — ABNORMAL LOW (ref 30.00–100.00)

## 2017-04-21 ENCOUNTER — Other Ambulatory Visit: Payer: Self-pay | Admitting: Family Medicine

## 2017-04-24 ENCOUNTER — Ambulatory Visit (INDEPENDENT_AMBULATORY_CARE_PROVIDER_SITE_OTHER): Payer: BLUE CROSS/BLUE SHIELD | Admitting: Family Medicine

## 2017-04-24 ENCOUNTER — Encounter: Payer: Self-pay | Admitting: Family Medicine

## 2017-04-24 VITALS — BP 118/70 | HR 87 | Temp 97.6°F | Ht 64.0 in | Wt 285.4 lb

## 2017-04-24 DIAGNOSIS — IMO0001 Reserved for inherently not codable concepts without codable children: Secondary | ICD-10-CM

## 2017-04-24 DIAGNOSIS — E785 Hyperlipidemia, unspecified: Secondary | ICD-10-CM | POA: Diagnosis not present

## 2017-04-24 DIAGNOSIS — K219 Gastro-esophageal reflux disease without esophagitis: Secondary | ICD-10-CM | POA: Diagnosis not present

## 2017-04-24 DIAGNOSIS — S29012D Strain of muscle and tendon of back wall of thorax, subsequent encounter: Secondary | ICD-10-CM | POA: Diagnosis not present

## 2017-04-24 DIAGNOSIS — Z0000 Encounter for general adult medical examination without abnormal findings: Secondary | ICD-10-CM

## 2017-04-24 DIAGNOSIS — E1165 Type 2 diabetes mellitus with hyperglycemia: Secondary | ICD-10-CM

## 2017-04-24 DIAGNOSIS — F418 Other specified anxiety disorders: Secondary | ICD-10-CM

## 2017-04-24 DIAGNOSIS — E559 Vitamin D deficiency, unspecified: Secondary | ICD-10-CM

## 2017-04-24 NOTE — Assessment & Plan Note (Signed)
Anticipate lat dorsi strain on right - ongoing despite conservative treatment with muscle relaxant and heating pad - ongoing over 1 month now. Pt interested in PT referral for evaluation, review of HEP - will refer.

## 2017-04-24 NOTE — Patient Instructions (Addendum)
Call to schedule follow up mammogram at [facility] [phone] If you go on low carb diet, continue monitoring sugars, and as they drop, first medicine to hold would be glimepiride.  Let me know about wellbutrin dosing later this week or next week.  Good luck with job search!  We will refer you to physical therapy - at least one session to learn home exercise program for ongoing R sided back pain.  Return in 4 months for follow up visit.   Health Maintenance, Female Adopting a healthy lifestyle and getting preventive care can go a long way to promote health and wellness. Talk with your health care provider about what schedule of regular examinations is right for you. This is a good chance for you to check in with your provider about disease prevention and staying healthy. In between checkups, there are plenty of things you can do on your own. Experts have done a lot of research about which lifestyle changes and preventive measures are most likely to keep you healthy. Ask your health care provider for more information. Weight and diet Eat a healthy diet  Be sure to include plenty of vegetables, fruits, low-fat dairy products, and lean protein.  Do not eat a lot of foods high in solid fats, added sugars, or salt.  Get regular exercise. This is one of the most important things you can do for your health. ? Most adults should exercise for at least 150 minutes each week. The exercise should increase your heart rate and make you sweat (moderate-intensity exercise). ? Most adults should also do strengthening exercises at least twice a week. This is in addition to the moderate-intensity exercise.  Maintain a healthy weight  Body mass index (BMI) is a measurement that can be used to identify possible weight problems. It estimates body fat based on height and weight. Your health care provider can help determine your BMI and help you achieve or maintain a healthy weight.  For females 78 years of  age and older: ? A BMI below 18.5 is considered underweight. ? A BMI of 18.5 to 24.9 is normal. ? A BMI of 25 to 29.9 is considered overweight. ? A BMI of 30 and above is considered obese.  Watch levels of cholesterol and blood lipids  You should start having your blood tested for lipids and cholesterol at 47 years of age, then have this test every 5 years.  You may need to have your cholesterol levels checked more often if: ? Your lipid or cholesterol levels are high. ? You are older than 47 years of age. ? You are at high risk for heart disease.  Cancer screening Lung Cancer  Lung cancer screening is recommended for adults 75-27 years old who are at high risk for lung cancer because of a history of smoking.  A yearly low-dose CT scan of the lungs is recommended for people who: ? Currently smoke. ? Have quit within the past 15 years. ? Have at least a 30-pack-year history of smoking. A pack year is smoking an average of one pack of cigarettes a day for 1 year.  Yearly screening should continue until it has been 15 years since you quit.  Yearly screening should stop if you develop a health problem that would prevent you from having lung cancer treatment.  Breast Cancer  Practice breast self-awareness. This means understanding how your breasts normally appear and feel.  It also means doing regular breast self-exams. Let your health care provider know  about any changes, no matter how small.  If you are in your 20s or 30s, you should have a clinical breast exam (CBE) by a health care provider every 1-3 years as part of a regular health exam.  If you are 55 or older, have a CBE every year. Also consider having a breast X-ray (mammogram) every year.  If you have a family history of breast cancer, talk to your health care provider about genetic screening.  If you are at high risk for breast cancer, talk to your health care provider about having an MRI and a mammogram every  year.  Breast cancer gene (BRCA) assessment is recommended for women who have family members with BRCA-related cancers. BRCA-related cancers include: ? Breast. ? Ovarian. ? Tubal. ? Peritoneal cancers.  Results of the assessment will determine the need for genetic counseling and BRCA1 and BRCA2 testing.  Cervical Cancer Your health care provider may recommend that you be screened regularly for cancer of the pelvic organs (ovaries, uterus, and vagina). This screening involves a pelvic examination, including checking for microscopic changes to the surface of your cervix (Pap test). You may be encouraged to have this screening done every 3 years, beginning at age 31.  For women ages 41-65, health care providers may recommend pelvic exams and Pap testing every 3 years, or they may recommend the Pap and pelvic exam, combined with testing for human papilloma virus (HPV), every 5 years. Some types of HPV increase your risk of cervical cancer. Testing for HPV may also be done on women of any age with unclear Pap test results.  Other health care providers may not recommend any screening for nonpregnant women who are considered low risk for pelvic cancer and who do not have symptoms. Ask your health care provider if a screening pelvic exam is right for you.  If you have had past treatment for cervical cancer or a condition that could lead to cancer, you need Pap tests and screening for cancer for at least 20 years after your treatment. If Pap tests have been discontinued, your risk factors (such as having a new sexual partner) need to be reassessed to determine if screening should resume. Some women have medical problems that increase the chance of getting cervical cancer. In these cases, your health care provider may recommend more frequent screening and Pap tests.  Colorectal Cancer  This type of cancer can be detected and often prevented.  Routine colorectal cancer screening usually begins at 47  years of age and continues through 47 years of age.  Your health care provider may recommend screening at an earlier age if you have risk factors for colon cancer.  Your health care provider may also recommend using home test kits to check for hidden blood in the stool.  A small camera at the end of a tube can be used to examine your colon directly (sigmoidoscopy or colonoscopy). This is done to check for the earliest forms of colorectal cancer.  Routine screening usually begins at age 12.  Direct examination of the colon should be repeated every 5-10 years through 47 years of age. However, you may need to be screened more often if early forms of precancerous polyps or small growths are found.  Skin Cancer  Check your skin from head to toe regularly.  Tell your health care provider about any new moles or changes in moles, especially if there is a change in a mole's shape or color.  Also tell your health care  provider if you have a mole that is larger than the size of a pencil eraser.  Always use sunscreen. Apply sunscreen liberally and repeatedly throughout the day.  Protect yourself by wearing long sleeves, pants, a wide-brimmed hat, and sunglasses whenever you are outside.  Heart disease, diabetes, and high blood pressure  High blood pressure causes heart disease and increases the risk of stroke. High blood pressure is more likely to develop in: ? People who have blood pressure in the high end of the normal range (130-139/85-89 mm Hg). ? People who are overweight or obese. ? People who are African American.  If you are 31-50 years of age, have your blood pressure checked every 3-5 years. If you are 39 years of age or older, have your blood pressure checked every year. You should have your blood pressure measured twice-once when you are at a hospital or clinic, and once when you are not at a hospital or clinic. Record the average of the two measurements. To check your blood pressure  when you are not at a hospital or clinic, you can use: ? An automated blood pressure machine at a pharmacy. ? A home blood pressure monitor.  If you are between 27 years and 15 years old, ask your health care provider if you should take aspirin to prevent strokes.  Have regular diabetes screenings. This involves taking a blood sample to check your fasting blood sugar level. ? If you are at a normal weight and have a low risk for diabetes, have this test once every three years after 47 years of age. ? If you are overweight and have a high risk for diabetes, consider being tested at a younger age or more often. Preventing infection Hepatitis B  If you have a higher risk for hepatitis B, you should be screened for this virus. You are considered at high risk for hepatitis B if: ? You were born in a country where hepatitis B is common. Ask your health care provider which countries are considered high risk. ? Your parents were born in a high-risk country, and you have not been immunized against hepatitis B (hepatitis B vaccine). ? You have HIV or AIDS. ? You use needles to inject street drugs. ? You live with someone who has hepatitis B. ? You have had sex with someone who has hepatitis B. ? You get hemodialysis treatment. ? You take certain medicines for conditions, including cancer, organ transplantation, and autoimmune conditions.  Hepatitis C  Blood testing is recommended for: ? Everyone born from [birth year] through 1966. ? Anyone with known risk factors for hepatitis C.  Sexually transmitted infections (STIs)  You should be screened for sexually transmitted infections (STIs) including gonorrhea and chlamydia if: ? You are sexually active and are younger than 47 years of age. ? You are older than 47 years of age and your health care provider tells you that you are at risk for this type of infection. ? Your sexual activity has changed since you were last screened and you are at an increased  risk for chlamydia or gonorrhea. Ask your health care provider if you are at risk.  If you do not have HIV, but are at risk, it may be recommended that you take a prescription medicine daily to prevent HIV infection. This is called pre-exposure prophylaxis (PrEP). You are considered at risk if: ? You are sexually active and do not regularly use condoms or know the HIV status of your partner(s). ? You take  drugs by injection. ? You are sexually active with a partner who has HIV.  Talk with your health care provider about whether you are at high risk of being infected with HIV. If you choose to begin PrEP, you should first be tested for HIV. You should then be tested every 3 months for as long as you are taking PrEP. Pregnancy  If you are premenopausal and you may become pregnant, ask your health care provider about preconception counseling.  If you may become pregnant, take 400 to 800 micrograms (mcg) of folic acid every day.  If you want to prevent pregnancy, talk to your health care provider about birth control (contraception). Osteoporosis and menopause  Osteoporosis is a disease in which the bones lose minerals and strength with aging. This can result in serious bone fractures. Your risk for osteoporosis can be identified using a bone density scan.  If you are 42 years of age or older, or if you are at risk for osteoporosis and fractures, ask your health care provider if you should be screened.  Ask your health care provider whether you should take a calcium or vitamin D supplement to lower your risk for osteoporosis.  Menopause may have certain physical symptoms and risks.  Hormone replacement therapy may reduce some of these symptoms and risks. Talk to your health care provider about whether hormone replacement therapy is right for you. Follow these instructions at home:  Schedule regular health, dental, and eye exams.  Stay current with your immunizations.  Do not use any  tobacco products including cigarettes, chewing tobacco, or electronic cigarettes.  If you are pregnant, do not drink alcohol.  If you are breastfeeding, limit how much and how often you drink alcohol.  Limit alcohol intake to no more than 1 drink per day for nonpregnant women. One drink equals 12 ounces of beer, 5 ounces of wine, or 1 ounces of hard liquor.  Do not use street drugs.  Do not share needles.  Ask your health care provider for help if you need support or information about quitting drugs.  Tell your health care provider if you often feel depressed.  Tell your health care provider if you have ever been abused or do not feel safe at home. This information is not intended to replace advice given to you by your health care provider. Make sure you discuss any questions you have with your health care provider. Document Released: 03/21/2011 Document Revised: 02/11/2016 Document Reviewed: 06/09/2015 Elsevier Interactive Patient Education  [employer].

## 2017-04-24 NOTE — Assessment & Plan Note (Signed)
Encouraged she be more regular with vit D.

## 2017-04-24 NOTE — Progress Notes (Signed)
BP 118/70 (BP Location: Right Arm, Patient Position: Sitting, Cuff Size: Large)   Pulse 87   Temp 97.6 F (36.4 C) (Oral)   Ht 5\' 4"  (1.626 m)   Wt 285 lb 6.4 oz (129.5 kg)   LMP 04/06/2017   SpO2 98%   BMI 48.99 kg/m    CC: CPE Subjective:    Patient ID: Lisa Curry, female    DOB: [birth year], 46 y.o.   MRN: [id number]  HPI: Lisa Curry is a 46 y.o. female presenting on 04/24/2017 for Annual Exam (Pt would like to discuss her diet and discuss her hormone levels, Want to update you on her back as well)   Increased anxiety with increased financial stressors at home. Remains unemployed.  Husband has started working overtime - increased relationship stress.  Ongoing R mid back pain for 2.5 months. Some days worse than others. Treating with hydrocodone, aleve, muscle relaxant, heating pad. Takes aleve every night. Interested in physical therapy.   DM - brings sugar log - checks several times a week. Fasting 100s-150s. PM 140-160. Considering starting very low carb diet.   Preventative: LMP 04/06/2017 Pap Smear: 2012, 2015 (absent transformation zone), 2016 WNL. Rpt due 2019.  Mammogram: 12/2013 normal, would like Q2 yrs. Due.  EGD - endorses h/o barrett's at last endoscopy at [facility]. May want to establish locally but requests deferred for now Flu: done Pneumovax 2015 Tetanus: >10 yrs - declines.  Seat belt use discussed Sunscreen use discussed. No changing moles on skin.  Non smoker Alcohol - none  Eye Doctor: QO year.  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 Activity: no regular exercise Diet: good water, fruits/vegetables daily  Relevant past medical, surgical, family and social history reviewed and updated as indicated. Interim medical history since our last visit reviewed. Allergies and medications reviewed and updated. Outpatient  Medications Prior to Visit  Medication Sig Dispense Refill  . atorvastatin (LIPITOR) 20 MG tablet TAKE 1 TABLET (20 MG TOTAL) BY MOUTH DAILY. 30 tablet 6  . buPROPion (WELLBUTRIN) 75 MG tablet Take 1 tablet (75 mg total) by mouth 2 (two) times daily. 60 tablet 11  . Cholecalciferol (VITAMIN D3) 1000 UNITS CAPS Take 1 capsule (1,000 Units total) by mouth daily. 30 capsule   . clonazePAM (KLONOPIN) 0.5 MG tablet TAKE 1 TABLET BY MOUTH DAILY. MAY TAKE 1 ADDITIONAL TABLET DAILY IF NEEDED 60 tablet 1  . clotrimazole (LOTRIMIN) 1 % cream Apply 1 application topically 2 (two) times daily. 60 g 0  . cyclobenzaprine (FLEXERIL) 10 MG tablet Take 1 tablet (10 mg total) by mouth 2 (two) times daily as needed for muscle spasms (sedation precautions). 30 tablet 0  . dexlansoprazole (DEXILANT) 60 MG capsule TAKE 1 CAPSULE (60 MG TOTAL) BY MOUTH DAILY. 30 capsule 11  . glimepiride (AMARYL) 4 MG tablet Take 1 tablet (4 mg total) by mouth 2 (two) times daily. Take with meals 60 tablet 6  . glucose blood (ONE TOUCH ULTRA TEST) test strip Use to check sugar twice daily and as needed. Dx: E11.65 200 each 3  . Multiple Vitamin (MULTIVITAMIN) capsule Take 1 capsule by mouth daily.    . pioglitazone (ACTOS) 30 MG tablet TAKE 1 TABLET (30 MG TOTAL) BY MOUTH DAILY. 30 tablet 6  . TRADJENTA 5 MG TABS tablet TAKE 1 TABLET (5 MG TOTAL) BY MOUTH DAILY.  30 tablet 6  . venlafaxine XR (EFFEXOR-XR) 75 MG 24 hr capsule TAKE 3 CAPSULES BY MOUTH DAILY WITH BREAKFAST EVERY MORNING 90 capsule 6  . diclofenac (VOLTAREN) 75 MG EC tablet Take 1 tablet (75 mg total) by mouth 2 (two) times daily. (Patient not taking: Reported on 04/24/2017) 30 tablet 0   No facility-administered medications prior to visit.      Per HPI unless specifically indicated in ROS section below Review of Systems  Constitutional: Negative for activity change, appetite change, chills, fatigue, fever and unexpected weight change.  HENT: Negative for hearing loss.     Eyes: Negative for visual disturbance.  Respiratory: Positive for cough (occas) and shortness of breath (?anxiety related). Negative for chest tightness and wheezing.   Cardiovascular: Positive for leg swelling. Negative for chest pain and palpitations.  Gastrointestinal: Negative for abdominal distention, abdominal pain, blood in stool, constipation, diarrhea, nausea and vomiting.  Genitourinary: Negative for difficulty urinating and hematuria.  Musculoskeletal: Negative for arthralgias, myalgias and neck pain.  Skin: Negative for rash.  Neurological: Negative for dizziness, seizures, syncope and headaches.  Hematological: Negative for adenopathy. Does not bruise/bleed easily.  Psychiatric/Behavioral: Negative for dysphoric mood. The patient is nervous/anxious.        Objective:    BP 118/70 (BP Location: Right Arm, Patient Position: Sitting, Cuff Size: Large)   Pulse 87   Temp 97.6 F (36.4 C) (Oral)   Ht 5\' 4"  (1.626 m)   Wt 285 lb 6.4 oz (129.5 kg)   LMP 04/06/2017   SpO2 98%   BMI 48.99 kg/m   Wt Readings from Last 3 Encounters:  04/24/17 285 lb 6.4 oz (129.5 kg)  03/20/17 279 lb (126.6 kg)  01/31/17 273 lb 8 oz (124.1 kg)    Physical Exam  Constitutional: She is oriented to person, place, and time. She appears well-developed and well-nourished. No distress.  HENT:  Head: Normocephalic and atraumatic.  Right Ear: Hearing, tympanic membrane, external ear and ear canal normal.  Left Ear: Hearing, tympanic membrane, external ear and ear canal normal.  Nose: Nose normal.  Mouth/Throat: Uvula is midline, oropharynx is clear and moist and mucous membranes are normal. No oropharyngeal exudate, posterior oropharyngeal edema or posterior oropharyngeal erythema.  Eyes: Pupils are equal, round, and reactive to light. Conjunctivae and EOM are normal. No scleral icterus.  Neck: Normal range of motion. Neck supple. No thyromegaly present.  Cardiovascular: Normal rate, regular rhythm,  normal heart sounds and intact distal pulses.   No murmur heard. Pulses:      Radial pulses are 2+ on the right side, and 2+ on the left side.  Pulmonary/Chest: Effort normal and breath sounds normal. No respiratory distress. She has no wheezes. She has no rales.  Abdominal: Soft. Bowel sounds are normal. She exhibits no distension and no mass. There is no tenderness. There is no rebound and no guarding.  Musculoskeletal: Normal range of motion. She exhibits no edema.  No significant midline spine tenderness.  Tender to palpation R flank region as well as lateral chest below ribcage at lat dorsi  Lymphadenopathy:    She has no cervical adenopathy.  Neurological: She is alert and oriented to person, place, and time.  CN grossly intact, station and gait intact  Skin: Skin is warm and dry. No rash noted.  Psychiatric: She has a normal mood and affect. Her behavior is normal. Judgment and thought content normal.  Nursing note and vitals reviewed.  Results for orders placed or performed in  visit on 04/18/17  Lipid panel  Result Value Ref Range   Cholesterol 137 0 - 200 mg/dL   Triglycerides 220.0 (H) 0.0 - 149.0 mg/dL   HDL 42.80 >39.00 mg/dL   VLDL 44.0 (H) 0.0 - 40.0 mg/dL   Total CHOL/HDL Ratio 3    NonHDL 94.65   Comprehensive metabolic panel  Result Value Ref Range   Sodium 136 135 - 145 mEq/L   Potassium 3.6 3.5 - 5.1 mEq/L   Chloride 100 96 - 112 mEq/L   CO2 29 19 - 32 mEq/L   Glucose, Bld 141 (H) 70 - 99 mg/dL   BUN 10 6 - 23 mg/dL   Creatinine, Ser 0.77 0.40 - 1.20 mg/dL   Total Bilirubin 0.3 0.2 - 1.2 mg/dL   Alkaline Phosphatase 84 39 - 117 U/L   AST 13 0 - 37 U/L   ALT 10 0 - 35 U/L   Total Protein 6.3 6.0 - 8.3 g/dL   Albumin 3.5 3.5 - 5.2 g/dL   Calcium 8.5 8.4 - 10.5 mg/dL   GFR 85.40 >60.00 mL/min  Hemoglobin A1c  Result Value Ref Range   Hgb A1c MFr Bld 6.8 (H) 4.6 - 6.5 %  VITAMIN D 25 Hydroxy (Vit-D Deficiency, Fractures)  Result Value Ref Range   VITD  20.56 (L) 30.00 - 100.00 ng/mL  Microalbumin / creatinine urine ratio  Result Value Ref Range   Microalb, Ur 1.2 0.0 - 1.9 mg/dL   Creatinine,U 212.2 mg/dL   Microalb Creat Ratio 0.6 0.0 - 30.0 mg/g  LDL cholesterol, direct  Result Value Ref Range   Direct LDL 73.0 mg/dL      Assessment & Plan:  Patient will call to schedule mammogram as overdue - # provided today. Problem List Items Addressed This Visit    Depression with anxiety    Deterioration due to current family and financial stressors including unemployment. 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 Continue high dose effexor 225mg  daily, will let me know if desires to increase wellbutrin depending on results of recent interviews.       Diabetes mellitus type 2, uncontrolled (HCC)    Chronic, stable with latest A1c at goal <7%. Continue current regimen. Discussed pt's interested in low carb high fat diet, reviewed goal cbg's and indication when to discontinue sulfonylurea if necessary. RTC 4 mo f/u visit.      GERD (gastroesophageal reflux disease)    I've never received records from [place] but pt states she has h/o barrett's and recently received letter to return for rpt EGD. She desires to defer referral at this time, but when decides to would like to stay local with [facility].       Health maintenance examination - Primary    Preventative protocols reviewed and updated unless pt declined. Discussed healthy diet and lifestyle.       HLD (hyperlipidemia)    Trig elevated, LDL at goal. Reviewed healthy diet changes to improve triglyceride control      Severe obesity (BMI >= 40) (HCC)    Encouraged healthy idet and lifestyle changes to affect sustainable weight loss       Strain of latissimus dorsi muscle    Anticipate lat dorsi strain on right - ongoing despite conservative treatment with muscle relaxant and heating pad - ongoing over 1 month now. Pt interested in PT referral for evaluation,  review of HEP - will refer.       Relevant Orders   Ambulatory  referral to Physical Therapy   Vitamin D deficiency    Encouraged she be more regular with vit D.           Follow up plan: Return in about 4 months (around 08/24/2017) for follow up visit.  Ria Bush, MD

## 2017-04-24 NOTE — Assessment & Plan Note (Signed)
Trig elevated, LDL at goal. Reviewed healthy diet changes to improve triglyceride control

## 2017-04-24 NOTE — Assessment & Plan Note (Signed)
Encouraged healthy idet and lifestyle changes to affect sustainable weight loss

## 2017-04-24 NOTE — Assessment & Plan Note (Signed)
Preventative protocols reviewed and updated unless pt declined. Discussed healthy diet and lifestyle.  

## 2017-04-24 NOTE — Assessment & Plan Note (Signed)
I've never received records from [place] but pt states she has h/o barrett's and recently received letter to return for rpt EGD. She desires to defer referral at this time, but when decides to would like to stay local with [facility].

## 2017-04-24 NOTE — Assessment & Plan Note (Signed)
Chronic, stable with latest A1c at goal <7%. Continue current regimen. Discussed pt's interested in low carb high fat diet, reviewed goal cbg's and indication when to discontinue sulfonylurea if necessary. RTC 4 mo f/u visit.

## 2017-04-24 NOTE — Assessment & Plan Note (Signed)
Deterioration due to current family and financial stressors including unemployment. 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 Continue high dose effexor 225mg  daily, will let me know if desires to increase wellbutrin depending on results of recent interviews.

## 2017-05-02 ENCOUNTER — Encounter: Payer: Self-pay | Admitting: Family Medicine

## 2017-06-15 ENCOUNTER — Other Ambulatory Visit: Payer: Self-pay | Admitting: Family Medicine

## 2017-06-16 ENCOUNTER — Other Ambulatory Visit: Payer: Self-pay | Admitting: Family Medicine

## 2017-06-16 NOTE — Telephone Encounter (Signed)
Refill left on vm at pharmacy.  

## 2017-06-16 NOTE — Telephone Encounter (Signed)
Last filled:  04/21/17. #60 Last OV (CPE):  04/24/17 Next OV:  none

## 2017-06-16 NOTE — Telephone Encounter (Signed)
plz phone in. 

## 2017-06-23 ENCOUNTER — Ambulatory Visit (INDEPENDENT_AMBULATORY_CARE_PROVIDER_SITE_OTHER): Payer: BLUE CROSS/BLUE SHIELD | Admitting: Family Medicine

## 2017-06-23 ENCOUNTER — Encounter: Payer: Self-pay | Admitting: Family Medicine

## 2017-06-23 VITALS — BP 132/78 | HR 99 | Temp 98.1°F | Wt 271.2 lb

## 2017-06-23 DIAGNOSIS — M79672 Pain in left foot: Secondary | ICD-10-CM | POA: Diagnosis not present

## 2017-06-23 DIAGNOSIS — M25511 Pain in right shoulder: Secondary | ICD-10-CM

## 2017-06-23 DIAGNOSIS — M545 Low back pain, unspecified: Secondary | ICD-10-CM

## 2017-06-23 DIAGNOSIS — Z23 Encounter for immunization: Secondary | ICD-10-CM | POA: Diagnosis not present

## 2017-06-23 MED ORDER — CYCLOBENZAPRINE HCL 10 MG PO TABS: 10.0000 mg | ORAL_TABLET | Freq: Two times a day (BID) | ORAL | 1 refills | Status: DC | PRN

## 2017-06-23 NOTE — Progress Notes (Signed)
BP 132/78 (BP Location: Left Arm, Patient Position: Sitting, Cuff Size: Large)   Pulse 99   Temp 98.1 F (36.7 C) (Oral)   Wt 271 lb 4 oz (123 kg)   LMP 06/04/2017   SpO2 98%   BMI 46.56 kg/m    CC: back pain Subjective:    Patient ID: [name], female    DOB: 11-Jun-1971, 46 y.o.   MRN: [id number]  HPI: SABINE TENENBAUM is a 46 y.o. female presenting on 06/23/2017 for Back Pain (lower left radiating into left hip); Plantar Fasciitis (in left foot); and Arm Pain (rigtht upper arm)   Ongoing weight loss attempts with low carb diet. "reversing diabetes".   R thoracic back pain improved after she started following keto-like diet.   3 wk h/o L lower back pain that started when she was in bathroom, reached over to flush toilet. No radiation of pain down legs or numbness/weakness of legs, fevers, bowel/bladder incontinence. No midline back pain. Has been treating with aleve and flexeril 5mg  PRN. Again acutely worsened 1 wk ago.   Several month history of L medial heel pain not improved despite new sandals. H/o R plantar fasciitis in the past.   R anteriolateral shoulder pain more noticeable at night time, with lifting or pressure on arm. Tender to palpation at [facility] joint. Denies inciting trauma or falls. No neck pain.   Relevant past medical, surgical, family and social history reviewed and updated as indicated. Interim medical history since our last visit reviewed. Allergies and medications reviewed and updated. Outpatient Medications Prior to Visit  Medication Sig Dispense Refill  . atorvastatin (LIPITOR) 20 MG tablet TAKE 1 TABLET (20 MG TOTAL) BY MOUTH DAILY. 30 tablet 6  . buPROPion (WELLBUTRIN) 75 MG tablet TAKE 1 TABLET (75 MG TOTAL) BY MOUTH 2 (TWO) TIMES DAILY. 60 tablet 3  . Cholecalciferol (VITAMIN D3) 1000 UNITS CAPS Take 1 capsule (1,000 Units total) by mouth daily. 30 capsule   . clonazePAM (KLONOPIN) 0.5 MG tablet TAKE 1 TABLET BY MOUTH DAILY. MAY TAKE 1 ADDITIONAL TABLET  DAILY IF NEEDED 60 tablet 1  . clotrimazole (LOTRIMIN) 1 % cream Apply 1 application topically 2 (two) times daily. 60 g 0  . dexlansoprazole (DEXILANT) 60 MG capsule TAKE 1 CAPSULE (60 MG TOTAL) BY MOUTH DAILY. 30 capsule 11  . glimepiride (AMARYL) 4 MG tablet Take 1 tablet (4 mg total) by mouth 2 (two) times daily. Take with meals 60 tablet 6  . glucose blood (ONE TOUCH ULTRA TEST) test strip Use to check sugar twice daily and as needed. Dx: E11.65 200 each 3  . Multiple Vitamin (MULTIVITAMIN) capsule Take 1 capsule by mouth daily.    . pioglitazone (ACTOS) 30 MG tablet TAKE 1 TABLET (30 MG TOTAL) BY MOUTH DAILY. 30 tablet 6  . TRADJENTA 5 MG TABS tablet TAKE 1 TABLET (5 MG TOTAL) BY MOUTH DAILY. 30 tablet 6  . venlafaxine XR (EFFEXOR-XR) 75 MG 24 hr capsule TAKE 3 CAPSULES BY MOUTH DAILY WITH BREAKFAST EVERY MORNING 90 capsule 6  . cyclobenzaprine (FLEXERIL) 10 MG tablet Take 1 tablet (10 mg total) by mouth 2 (two) times daily as needed for muscle spasms (sedation precautions). 30 tablet 0   No facility-administered medications prior to visit.      Per HPI unless specifically indicated in ROS section below Review of Systems     Objective:    BP 132/78 (BP Location: Left Arm, Patient Position: Sitting, Cuff Size: Large)  Pulse 99   Temp 98.1 F (36.7 C) (Oral)   Wt 271 lb 4 oz (123 kg)   LMP 06/04/2017   SpO2 98%   BMI 46.56 kg/m   Wt Readings from Last 3 Encounters:  06/23/17 271 lb 4 oz (123 kg)  04/24/17 285 lb 6.4 oz (129.5 kg)  03/20/17 279 lb (126.6 kg)    Physical Exam  Constitutional: She is oriented to person, place, and time. She appears well-developed and well-nourished. No distress.  Musculoskeletal: She exhibits no edema.  L shoulder WNL R Shoulder exam: No deformity of shoulders on inspection. Tender to palpation anterior and lateral shoulder  FROM in abduction and forward flexion. No pain or weakness with testing SITS in ext/int rotation. No pain with  empty can sign. ++ Speed test. No impingement. No pain with crossover test. No pain with rotation of humeral head in [place] joint.   No pain midline spine Discomfort L lower lumbar paraspinous mm  Neg SLR bilaterally. No pain with int/ext rotation at hip. Tender at left SIJ, GTB and sciatic notch bilaterally.   R foot WNL L foot: tender to palpation medial heel, discomfort with calcaneal squeeze  Neurological: She is oriented to person, place, and time.  5/5 strength BLE No antalgic gait  Skin: Skin is warm and dry. No rash noted. No erythema.  Nursing note and vitals reviewed.     Assessment & Plan:   Problem List Items Addressed This Visit    Lower back pain - Primary    Anticipate lumbar strain. Treat with ice/heating pad, aleve, flexeril, stretching exercises provided today.       Relevant Medications   cyclobenzaprine (FLEXERIL) 10 MG tablet   Pain of left heel    Developing plantar fasciitis vs heel spur. rec frozen water bottle massages, stretching, and gel heel insert.      Right shoulder pain    Exam most consistent with biceps tendonitis. Treat with aleve scheduled, exercises provided today, ice/heat. Update if not improving with treatment.       Severe obesity (BMI >= 40) (HCC)    Congratulated on weight loss to date with keto-like diet.        Other Visit Diagnoses    Need for influenza vaccination       Relevant Orders   Flu Vaccine QUAD 6+ mos PF IM (Fluarix Quad PF) (Completed)       Follow up plan: Return if symptoms worsen or fail to improve.  Ria Bush, MD

## 2017-06-23 NOTE — Assessment & Plan Note (Signed)
Exam most consistent with biceps tendonitis. Treat with aleve scheduled, exercises provided today, ice/heat. Update if not improving with treatment.

## 2017-06-23 NOTE — Assessment & Plan Note (Signed)
Developing plantar fasciitis vs heel spur. rec frozen water bottle massages, stretching, and gel heel insert.

## 2017-06-23 NOTE — Assessment & Plan Note (Signed)
Anticipate lumbar strain. Treat with ice/heating pad, aleve, flexeril, stretching exercises provided today.

## 2017-06-23 NOTE — Assessment & Plan Note (Signed)
Congratulated on weight loss to date with keto-like diet.

## 2017-06-23 NOTE — Patient Instructions (Addendum)
For left heel - possible plantar fasciitis or heel spur - treat with frozen water bottle massage and buy gel heel insert and use supportive shoe For back pain - I think you have a lumbar sprain that should improve over time. Continue flexeril, ice or heating pad (whichever soothes better), and do exercises provided today. For R shoulder - I think you have biceps tendonitis. Continue aleve 2 tablets twice daily with meals for 5 days then back down to w daily total, do exercises provided today.

## 2017-07-31 ENCOUNTER — Ambulatory Visit (INDEPENDENT_AMBULATORY_CARE_PROVIDER_SITE_OTHER): Payer: BLUE CROSS/BLUE SHIELD

## 2017-07-31 ENCOUNTER — Encounter (INDEPENDENT_AMBULATORY_CARE_PROVIDER_SITE_OTHER): Payer: Self-pay | Admitting: Specialist

## 2017-07-31 ENCOUNTER — Ambulatory Visit (INDEPENDENT_AMBULATORY_CARE_PROVIDER_SITE_OTHER): Payer: BLUE CROSS/BLUE SHIELD | Admitting: Specialist

## 2017-07-31 ENCOUNTER — Telehealth (INDEPENDENT_AMBULATORY_CARE_PROVIDER_SITE_OTHER): Payer: Self-pay | Admitting: Specialist

## 2017-07-31 VITALS — BP 131/62 | HR 85 | Ht 64.0 in | Wt 260.0 lb

## 2017-07-31 DIAGNOSIS — M722 Plantar fascial fibromatosis: Secondary | ICD-10-CM | POA: Diagnosis not present

## 2017-07-31 DIAGNOSIS — M545 Low back pain: Secondary | ICD-10-CM

## 2017-07-31 DIAGNOSIS — M5136 Other intervertebral disc degeneration, lumbar region: Secondary | ICD-10-CM

## 2017-07-31 MED ORDER — DICLOFENAC-MISOPROSTOL 50-0.2 MG PO TBEC: 1.0000 | DELAYED_RELEASE_TABLET | Freq: Two times a day (BID) | ORAL | 2 refills | Status: DC

## 2017-07-31 MED ORDER — DICLOFENAC SODIUM 1 % TD GEL: 2.0000 g | Freq: Four times a day (QID) | TRANSDERMAL | Status: DC

## 2017-07-31 NOTE — Patient Instructions (Signed)
Avoid frequent bending and stooping  No lifting greater than 10 lbs. May use ice or moist heat for pain. Weight loss is of benefit. 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 Heel cord stretching exercises daily. Apply voltaren gel to the heels 3-4 times daily. Arthrotec 50-200 po BID for antiinflamatory affect.

## 2017-07-31 NOTE — Progress Notes (Signed)
Office Visit Note   Patient: Lisa Curry           Date of Birth: [birth year]           MRN: [id number] Visit Date: 07/31/2017              Requested by: Ria Bush, MD [address] [place], [place] [address] PCP: Ria Bush, MD   Assessment & Plan: Visit Diagnoses:  1. Low back pain, unspecified back pain laterality, unspecified chronicity, with sciatica presence unspecified   2. Degenerative disc disease, lumbar   3. Plantar fasciitis, bilateral     Plan: Avoid frequent bending and stooping  No lifting greater than 10 lbs. May use ice or moist heat for pain. Weight loss is of benefit. 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 Heel cord stretching exercises daily. Apply voltaren gel to the heels 3-4 times daily. Arthrotec 50-200 po BID for antiinflamatory affect.  Follow-Up Instructions: Return in about 6 weeks (around 09/11/2017).   Orders:  Orders Placed This Encounter  Procedures  . XR Lumbar Spine 2-3 Views   No orders of the defined types were placed in this encounter.     Procedures: No procedures performed   Clinical Data: No additional findings.   Subjective: Chief Complaint  Patient presents with  . Lower Back - Pain  . Right Foot - Pain  . Left Foot - Pain    46 year old female with history of heel and back pain for the last 6 months the back hurting and heel pain, left initially in the last 1-2 weeks the right heel is hurting also.  The back pain is intemittantly, worsening with twisting and bending. Lifting heavy weights without watching how she does it increases the pain. At night she gets up and  Is restless, so she goes to the recliner with a heating pad or without pad is better than the bed all night. Bed is thick memory foam for one year. The mattress was given to  Them by her sister. Pain is mostly in the back, with slouching she will get pain in her buttock. In July of 1996, she underwent a right L4-5  microdiscectomy. In the past had Nerve compression, PT and eventually surgery when I was at [facility]No bowel or bladder difficulty except constipation.She reports that she could walk a mile if she had too. Standing still in one place increases the pain and riding in a small car or coupe. In a minivan she does okay. 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    Review of Systems  Constitutional: Negative for activity change, appetite change, chills, diaphoresis, fatigue, fever and unexpected weight change.  HENT: Positive for rhinorrhea, sinus pressure, sinus pain and sneezing. Negative for congestion, dental problem, drooling, ear discharge, ear pain, facial swelling, hearing loss, mouth sores, nosebleeds, postnasal drip, sore throat, tinnitus, trouble swallowing and voice change.   Eyes: Negative for photophobia, pain, discharge, redness, itching and visual disturbance.  Respiratory: Positive for cough. Negative for apnea, choking, chest tightness, shortness of breath, wheezing and stridor.   Cardiovascular: Negative for chest pain, palpitations and leg swelling.  Gastrointestinal: Positive for constipation. Negative for abdominal distention, abdominal pain, anal bleeding, blood in stool, diarrhea, nausea and rectal pain.  Endocrine: Positive for cold intolerance. Negative for heat intolerance, polydipsia, polyphagia and polyuria.  Genitourinary: Negative for difficulty urinating, dyspareunia, dysuria, enuresis, flank pain, frequency and hematuria.  Musculoskeletal: Positive for back pain. Negative for arthralgias, gait problem, joint swelling, myalgias,  neck pain and neck stiffness.  Skin: Negative for color change, pallor, rash and wound.  Allergic/Immunologic: Positive for environmental allergies. Negative for food allergies and immunocompromised state.  Neurological: Positive for light-headedness, numbness and headaches. Negative for dizziness, tremors, seizures, syncope, facial asymmetry, speech difficulty and  weakness.  Hematological: Negative for adenopathy. Does not bruise/bleed easily.  Psychiatric/Behavioral: Negative for agitation, behavioral problems, confusion, decreased concentration, dysphoric mood, hallucinations, self-injury, sleep disturbance and suicidal ideas. The patient is not nervous/anxious and is not hyperactive.      Objective: Vital Signs: BP 131/62 (BP Location: Left Arm, Patient Position: Sitting)   Pulse 85   Ht 5\' 4"  (1.626 m)   Wt 260 lb (117.9 kg)   LMP 07/03/2017   BMI 44.63 kg/m   Physical Exam  Constitutional: She is oriented to person, place, and time. She appears well-developed and well-nourished.  HENT:  Head: Normocephalic and atraumatic.  Eyes: EOM are normal. Pupils are equal, round, and reactive to light.  Neck: Normal range of motion. Neck supple.  Pulmonary/Chest: Effort normal and breath sounds normal.  Abdominal: Soft. Bowel sounds are normal.  Musculoskeletal: Normal range of motion.  Neurological: She is alert and oriented to person, place, and time.  Skin: Skin is warm and dry.  Psychiatric: She has a normal mood and affect. Her behavior is normal. Judgment and thought content normal.    Back Exam   Tenderness  The patient is experiencing tenderness in the lumbar.  Range of Motion  Extension: normal  Flexion: normal  Lateral bend right: normal  Lateral bend left: normal  Rotation right: normal  Rotation left: normal   Muscle Strength  Right Quadriceps:  5/5  Left Quadriceps:  5/5  Right Hamstrings:  5/5  Left Hamstrings:  5/5   Tests  Straight leg raise right: negative Straight leg raise left: negative  Reflexes  Patellar: 1/4 Achilles: 0/4 Babinski's sign: normal   Other  Toe walk: normal Heel walk: normal Sensation: normal      Specialty Comments:  No specialty comments available.  Imaging: Xr Lumbar Spine 2-3 Views  Result Date: 07/31/2017 AP and lateral flexion and extension radiographs show DDD with  disc narrowing worse at L4-5 and L5-S1 , mild at L2-3, moderated at L1-2 and L3-4. No listhesis.     PMFS History: Patient Active Problem List   Diagnosis Date Noted  . Pain of left heel 06/23/2017  . Right shoulder pain 06/23/2017  . Strain of latissimus dorsi muscle 03/20/2017  . Anemia 09/01/2016  . Umbilical hernia 81/82/9937  . Lower back pain 08/16/2016  . Intertrigo 06/04/2016  . Sinus congestion 06/04/2016  . Vitamin D deficiency   . HLD (hyperlipidemia)   . Health maintenance examination 01/20/2015  . Severe obesity (BMI >= 40) ([place]) 11/04/2013  . GERD (gastroesophageal reflux disease) 11/04/2013  . Diabetes mellitus type 2, uncontrolled ([facility]) 11/04/2013  . Depression with anxiety 11/04/2013   Past Medical History:  Diagnosis Date  . Allergy   . Barrett esophagus    Don Bulla, have not received EGD report  . Depression with anxiety   . Diabetes mellitus without complication ([place])    DSME 04/2014  . Gastric polyp 05/2012   h/o EGD  . GERD (gastroesophageal reflux disease)   . History of Helicobacter pylori infection   . HLD (hyperlipidemia)   . Hyperlipidemia   . Vitamin D deficiency     Family History  Problem Relation Age of Onset  . Breast cancer Maternal Grandmother  45  . Heart disease Paternal Grandfather   . Supraventricular tachycardia Sister   . Other Sister        Postural Orthostatic Tachycardia Syndrome  . Other Maternal Aunt        hysterectomy for irregular bleed with abnormal pap    Past Surgical History:  Procedure Laterality Date  . BACK SURGERY  03/1995  . CESAREAN SECTION  6/95, 4/98  . DILATION AND CURETTAGE OF UTERUS  2011  . ESOPHAGOGASTRODUODENOSCOPY  2012?   Ferdinand Lango at [facility]  . NASAL SINUS SURGERY  03/2001   Social History   Occupational History  . Not on file  Tobacco Use  . Smoking status: Never Smoker  . Smokeless tobacco: Never Used  Substance and Sexual Activity  . Alcohol use: No    Alcohol/week: 0.0  oz  . Drug use: No  . Sexual activity: Not on file

## 2017-07-31 NOTE — Telephone Encounter (Signed)
Please call pt she needs prior authorization to pick up her gel

## 2017-07-31 NOTE — Telephone Encounter (Signed)
Curry,Lisa H  Mar 27, 1971      Please sched appt for [name] no availably in the system. Please leave voicemail message if you dont receive an answer.    Anytime Wed for scheduling purposes

## 2017-08-01 NOTE — Telephone Encounter (Signed)
I got info from pharm this morning and will work on it.

## 2017-08-02 NOTE — Telephone Encounter (Signed)
Submitted on Cover my meds

## 2017-08-03 ENCOUNTER — Encounter (INDEPENDENT_AMBULATORY_CARE_PROVIDER_SITE_OTHER): Payer: Self-pay | Admitting: Specialist

## 2017-08-07 ENCOUNTER — Other Ambulatory Visit: Payer: Self-pay | Admitting: Family Medicine

## 2017-08-07 DIAGNOSIS — E1165 Type 2 diabetes mellitus with hyperglycemia: Secondary | ICD-10-CM

## 2017-08-07 DIAGNOSIS — E559 Vitamin D deficiency, unspecified: Secondary | ICD-10-CM

## 2017-08-08 ENCOUNTER — Other Ambulatory Visit (INDEPENDENT_AMBULATORY_CARE_PROVIDER_SITE_OTHER): Payer: Self-pay | Admitting: Specialist

## 2017-08-08 NOTE — Telephone Encounter (Addendum)
I called and lmom advised that I tried to submit for Prior approval on Covermymeds site but they don't handle this plan so I called and spoke to the insurance comp and they have sent the info to the pharmacy dept to be reviewed and since she has a commercial plan it can take up to 15 days to be processed.  I have put her on a cancellation list so that if some one cancels then I will give her a call and let her know.---Ref # for call to insurance Comp is [id number]

## 2017-08-09 ENCOUNTER — Other Ambulatory Visit (INDEPENDENT_AMBULATORY_CARE_PROVIDER_SITE_OTHER): Payer: BLUE CROSS/BLUE SHIELD

## 2017-08-09 ENCOUNTER — Encounter (INDEPENDENT_AMBULATORY_CARE_PROVIDER_SITE_OTHER): Payer: Self-pay | Admitting: Specialist

## 2017-08-09 ENCOUNTER — Ambulatory Visit (INDEPENDENT_AMBULATORY_CARE_PROVIDER_SITE_OTHER): Payer: BLUE CROSS/BLUE SHIELD | Admitting: Specialist

## 2017-08-09 VITALS — BP 112/64 | HR 87 | Ht 64.0 in | Wt 260.0 lb

## 2017-08-09 DIAGNOSIS — M722 Plantar fascial fibromatosis: Secondary | ICD-10-CM

## 2017-08-09 DIAGNOSIS — E559 Vitamin D deficiency, unspecified: Secondary | ICD-10-CM | POA: Diagnosis not present

## 2017-08-09 DIAGNOSIS — E1165 Type 2 diabetes mellitus with hyperglycemia: Secondary | ICD-10-CM

## 2017-08-09 LAB — BASIC METABOLIC PANEL
BUN: 10 mg/dL (ref 6–23)
CO2: 31 mEq/L (ref 19–32)
Calcium: 9.2 mg/dL (ref 8.4–10.5)
Chloride: 102 mEq/L (ref 96–112)
Creatinine, Ser: 0.75 mg/dL (ref 0.40–1.20)
GFR: 87.91 mL/min (ref >60.00)
Glucose, Bld: 144 mg/dL — ABNORMAL HIGH (ref 70–99)
Potassium: 3.9 mEq/L (ref 3.5–5.1)
Sodium: 140 mEq/L (ref 135–145)

## 2017-08-09 LAB — VITAMIN D 25 HYDROXY (VIT D DEFICIENCY, FRACTURES): VITD: 31.25 ng/mL (ref 30.00–100.00)

## 2017-08-09 LAB — HEMOGLOBIN A1C: Hgb A1c MFr Bld: 6.3 % (ref 4.6–6.5)

## 2017-08-09 MED ORDER — METHYLPREDNISOLONE ACETATE 40 MG/ML IJ SUSP
40.0000 mg | INTRAMUSCULAR | Status: AC | PRN
  Administered 2017-08-09: 40 mg via INTRA_ARTICULAR

## 2017-08-09 MED ORDER — BUPIVACAINE HCL 0.25 % IJ SOLN
2.0000 mL | INTRAMUSCULAR | Status: AC | PRN
  Administered 2017-08-09: 2 mL via INTRA_ARTICULAR

## 2017-08-09 MED ORDER — DICLOFENAC SODIUM 1 % TD GEL: 2.0000 g | Freq: Four times a day (QID) | TRANSDERMAL | 3 refills | Status: DC

## 2017-08-09 MED ORDER — LIDOCAINE HCL (PF) 1 % IJ SOLN
3.0000 mL | INTRAMUSCULAR | Status: AC | PRN
  Administered 2017-08-09: 3 mL

## 2017-08-09 NOTE — Patient Instructions (Signed)
Wear well padded shoes. Do the strengthening exercises on the foot muscles. Heel cord stretching. See if you can use a pad for the floor in your work place.  Use diclofenac gel and by mouth as prescribed.  Lose weight to decrease foot pain and lower leg stresses.

## 2017-08-09 NOTE — Progress Notes (Signed)
Office Visit Note   Patient: Lisa Curry           Date of Birth: 1971-03-21           MRN: [id number] Visit Date: 08/09/2017              Requested by: Ria Bush, MD [address] [place], [place] [address] PCP: Ria Bush, MD   Assessment & Plan: Visit Diagnoses:  1. Plantar fasciitis of left foot     Plan: Wear well padded shoes. Do the strengthening exercises on the foot muscles. Heel cord stretching. See if you can use a pad for the floor in your work place.  Use diclofenac gel and by mouth as prescribed.  Lose weight to decrease foot pain and lower leg stresses.   Follow-Up Instructions: Return if symptoms worsen or fail to improve.   Orders:  Orders Placed This Encounter  Procedures  . Small Joint Inj   Meds ordered this encounter  Medications  . diclofenac sodium (VOLTAREN) 1 % GEL    Sig: Apply 2 g topically 4 (four) times daily.    Dispense:  3 Tube    Refill:  3      Procedures: Small Joint Inj on 08/09/2017 12:00 PM Indications: pain Details: 25 G needle, plantar approach  Spinal Needle: No  Medications: 3 mL lidocaine (PF) 1 %; 2 mL bupivacaine 0.25 %; 40 mg methylPREDNISolone acetate 40 MG/ML  Injection via a medial approach to the left heel performed, bandaid applied.  Procedure, treatment alternatives, risks and benefits explained, specific risks discussed. Immediately prior to procedure a time out was called to verify the correct patient, procedure, equipment, support staff and site/side marked as required. Patient was prepped and draped in the usual sterile fashion.       Clinical Data: No additional findings.   Subjective: Chief Complaint  Patient presents with  . Left Foot - Pain    Patient is wanting left heel injection    46 year old female with persistent pain and discomfort with standing and walking. Last seen with severe plantar fasciitis symptoms that persist. Rx for oral diclofenac was able to  be Submitted unfortunately the voltaren gel did not get submitted correctly, went into clinic administered no to pharmacy for filling.     Review of Systems  Constitutional: Negative.   HENT: Negative.   Eyes: Negative.   Respiratory: Negative.   Cardiovascular: Negative.   Gastrointestinal: Negative.   Endocrine: Negative.   Genitourinary: Negative.   Musculoskeletal: Negative.   Skin: Negative.   Allergic/Immunologic: Negative.   Neurological: Negative.   Hematological: Negative.   Psychiatric/Behavioral: Negative.      Objective: Vital Signs: BP 112/64 (BP Location: Left Arm, Patient Position: Sitting)   Pulse 87   Ht 5\' 4"  (1.626 m)   Wt 260 lb (117.9 kg)   BMI 44.63 kg/m   Physical Exam  Constitutional: She is oriented to person, place, and time. She appears well-developed and well-nourished.  HENT:  Head: Normocephalic and atraumatic.  Eyes: EOM are normal. Pupils are equal, round, and reactive to light.  Neck: Normal range of motion. Neck supple.  Pulmonary/Chest: Effort normal and breath sounds normal.  Abdominal: Soft. Bowel sounds are normal.  Neurological: She is alert and oriented to person, place, and time.  Skin: Skin is warm and dry.  Psychiatric: She has a normal mood and affect. Her behavior is normal. Judgment and thought content normal.    Left Ankle  Exam  Swelling: none  Range of Motion  Dorsiflexion:  0 abnormal  Plantar flexion: normal  Eversion: normal  Inversion: normal   Muscle Strength  Dorsiflexion:  5/5  Plantar flexion:  5/5  Anterior tibial:  5/5  Posterior tibial:  5/5 Gastrocsoleus:  5/5  Other  Erythema: absent Scars: absent Sensation: normal Pulse: present  Comments:  Pain with DF of the left foot with pressure over the heel tuberosity plantar attachment of the plantar fascial tissue.       Specialty Comments:  No specialty comments available.  Imaging: No results found.   PMFS History: Patient Active  Problem List   Diagnosis Date Noted  . Pain of left heel 06/23/2017  . Right shoulder pain 06/23/2017  . Strain of latissimus dorsi muscle 03/20/2017  . Anemia 09/01/2016  . Umbilical hernia 12/87/8676  . Lower back pain 08/16/2016  . Intertrigo 06/04/2016  . Sinus congestion 06/04/2016  . Vitamin D deficiency   . HLD (hyperlipidemia)   . Health maintenance examination 01/20/2015  . Severe obesity (BMI >= 40) ([place]) 11/04/2013  . GERD (gastroesophageal reflux disease) 11/04/2013  . Diabetes mellitus type 2, uncontrolled ([place]) 11/04/2013  . Depression with anxiety 11/04/2013   Past Medical History:  Diagnosis Date  . Allergy   . Barrett esophagus    Don Bulla, have not received EGD report  . Depression with anxiety   . Diabetes mellitus without complication ([place])    DSME 04/2014  . Gastric polyp 05/2012   h/o EGD  . GERD (gastroesophageal reflux disease)   . History of Helicobacter pylori infection   . HLD (hyperlipidemia)   . Hyperlipidemia   . Vitamin D deficiency     Family History  Problem Relation Age of Onset  . Breast cancer Maternal Grandmother 50  . Heart disease Paternal Grandfather   . Supraventricular tachycardia Sister   . Other Sister        Postural Orthostatic Tachycardia Syndrome  . Other Maternal Aunt        hysterectomy for irregular bleed with abnormal pap    Past Surgical History:  Procedure Laterality Date  . BACK SURGERY  03/1995  . CESAREAN SECTION  6/95, 4/98  . DILATION AND CURETTAGE OF UTERUS  2011  . ESOPHAGOGASTRODUODENOSCOPY  2012?   [facility]erdinand Lango at [facility]  . NASAL SINUS SURGERY  03/2001   Social History   Occupational History  . Not on file  Tobacco Use  . Smoking status: Never Smoker  . Smokeless tobacco: Never Used  Substance and Sexual Activity  . Alcohol use: No    Alcohol/week: 0.0 oz  . Drug use: No  . Sexual activity: Not on file

## 2017-08-16 ENCOUNTER — Ambulatory Visit (INDEPENDENT_AMBULATORY_CARE_PROVIDER_SITE_OTHER): Payer: BLUE CROSS/BLUE SHIELD | Admitting: Family Medicine

## 2017-08-16 ENCOUNTER — Encounter: Payer: Self-pay | Admitting: Family Medicine

## 2017-08-16 VITALS — BP 122/80 | HR 87 | Temp 98.2°F | Wt 263.0 lb

## 2017-08-16 DIAGNOSIS — K219 Gastro-esophageal reflux disease without esophagitis: Secondary | ICD-10-CM | POA: Diagnosis not present

## 2017-08-16 DIAGNOSIS — E119 Type 2 diabetes mellitus without complications: Secondary | ICD-10-CM

## 2017-08-16 DIAGNOSIS — K2270 Barrett's esophagus without dysplasia: Secondary | ICD-10-CM | POA: Diagnosis not present

## 2017-08-16 DIAGNOSIS — M79672 Pain in left foot: Secondary | ICD-10-CM | POA: Diagnosis not present

## 2017-08-16 DIAGNOSIS — R0981 Nasal congestion: Secondary | ICD-10-CM

## 2017-08-16 MED ORDER — DEXLANSOPRAZOLE 60 MG PO CPDR: DELAYED_RELEASE_CAPSULE | ORAL | 11 refills | Status: DC

## 2017-08-16 MED ORDER — CLONAZEPAM 0.5 MG PO TABS: ORAL_TABLET | ORAL | 1 refills | Status: DC

## 2017-08-16 NOTE — Progress Notes (Signed)
BP 122/80 (BP Location: Left Arm, Patient Position: Sitting, Cuff Size: Large)   Pulse 87   Temp 98.2 F (36.8 C) (Oral)   Wt 263 lb (119.3 kg)   LMP 08/01/2017   SpO2 100%   BMI 45.14 kg/m    CC:  4 mo f/u visit Subjective:    Patient ID: Lisa Curry, female    DOB: [birth year], 46 y.o.   MRN: [id number]  HPI: Lisa Curry is a 46 y.o. female presenting on 08/16/2017 for 4 mo follow-up   Started new long term sub jobs  Seeing Dr Louanne Skye ortho for plantar fasciitis and back pain. Treating with voltaren gel.  Ongoing weight loss with low carb diet "reversing diabetes". 22 lbs down since 04/2017.   DM - does regularly check sugars 114 this morning. Compliant with antihyperglycemic regimen which includes: tradjenta 65m daily, amaryl 4mg  bid. Denies low sugars or hypoglycemic symptoms. Denies paresthesias. Last diabetic eye exam due. Pneumovax: 06/2014. Prevnar: not due. Glucometer brand: accucheck. DSME: has not done. Foot exam today Lab Results  Component Value Date   HGBA1C 6.3 08/09/2017   Diabetic Foot Exam - Simple   Simple Foot Form Diabetic Foot exam was performed with the following findings:  Yes 08/16/2017  3:48 PM  Visual Inspection No deformities, no ulcerations, no other skin breakdown bilaterally:  Yes Sensation Testing Intact to touch and monofilament testing bilaterally:  Yes Pulse Check Posterior Tibialis and Dorsalis pulse intact bilaterally:  Yes Comments    Lab Results  Component Value Date   MICROALBUR 1.2 04/18/2017     Ongoing GERD despite dexilant daily. Pt states h/o barrett's during EGD 2012. Requests referral to establish with local MD.   Ongoing ST and nasal drainage over the last 2 weeks. No fevers. Treating with flonase and mucinex. rec start allegra D x 5 days and update me with effect.   Relevant past medical, surgical, family and social history reviewed and updated as indicated. Interim medical history since our last visit  reviewed. Allergies and medications reviewed and updated. Outpatient Medications Prior to Visit  Medication Sig Dispense Refill  . atorvastatin (LIPITOR) 20 MG tablet TAKE 1 TABLET (20 MG TOTAL) BY MOUTH DAILY. 30 tablet 6  . buPROPion (WELLBUTRIN) 75 MG tablet TAKE 1 TABLET (75 MG TOTAL) BY MOUTH 2 (TWO) TIMES DAILY. 60 tablet 3  . Cholecalciferol (VITAMIN D3) 1000 UNITS CAPS Take 1 capsule (1,000 Units total) by mouth daily. 30 capsule   . clotrimazole (LOTRIMIN) 1 % cream Apply 1 application topically 2 (two) times daily. 60 g 0  . cyclobenzaprine (FLEXERIL) 10 MG tablet Take 1 tablet (10 mg total) by mouth 2 (two) times daily as needed for muscle spasms (sedation precautions). 30 tablet 1  . diclofenac sodium (VOLTAREN) 1 % GEL Apply 2 g topically 4 (four) times daily. 3 Tube 3  . Diclofenac-Misoprostol 50-0.2 MG TBEC Take 1 tablet 2 (two) times daily after a meal by mouth. 60 tablet 2  . glimepiride (AMARYL) 4 MG tablet Take 1 tablet (4 mg total) by mouth 2 (two) times daily. Take with meals 60 tablet 6  . glucose blood (ONE TOUCH ULTRA TEST) test strip Use to check sugar twice daily and as needed. Dx: E11.65 200 each 3  . Multiple Vitamin (MULTIVITAMIN) capsule Take 1 capsule by mouth daily.    . pioglitazone (ACTOS) 30 MG tablet TAKE 1 TABLET (30 MG TOTAL) BY MOUTH DAILY. 30 tablet 6  . TRADJENTA 5 MG TABS  tablet TAKE 1 TABLET (5 MG TOTAL) BY MOUTH DAILY. 30 tablet 6  . venlafaxine XR (EFFEXOR-XR) 75 MG 24 hr capsule TAKE 3 CAPSULES BY MOUTH DAILY WITH BREAKFAST EVERY MORNING 90 capsule 6  . clonazePAM (KLONOPIN) 0.5 MG tablet TAKE 1 TABLET BY MOUTH DAILY. MAY TAKE 1 ADDITIONAL TABLET DAILY IF NEEDED 60 tablet 1  . dexlansoprazole (DEXILANT) 60 MG capsule TAKE 1 CAPSULE (60 MG TOTAL) BY MOUTH DAILY. 30 capsule 11   Facility-Administered Medications Prior to Visit  Medication Dose Route Frequency Provider Last Rate Last Dose  . diclofenac sodium (VOLTAREN) 1 % transdermal gel 2 g  2 g  Topical QID Jessy Oto, MD         Per HPI unless specifically indicated in ROS section below Review of Systems     Objective:    BP 122/80 (BP Location: Left Arm, Patient Position: Sitting, Cuff Size: Large)   Pulse 87   Temp 98.2 F (36.8 C) (Oral)   Wt 263 lb (119.3 kg)   LMP 08/01/2017   SpO2 100%   BMI 45.14 kg/m   Wt Readings from Last 3 Encounters:  08/16/17 263 lb (119.3 kg)  08/09/17 260 lb (117.9 kg)  07/31/17 260 lb (117.9 kg)    Physical Exam  Constitutional: She appears well-developed and well-nourished. No distress.  HENT:  Head: Normocephalic and atraumatic.  Right Ear: Hearing, tympanic membrane, external ear and ear canal normal.  Left Ear: Hearing, tympanic membrane, external ear and ear canal normal.  Nose: Mucosal edema (and erythema) present. No rhinorrhea. Right sinus exhibits maxillary sinus tenderness (mild). Right sinus exhibits no frontal sinus tenderness. Left sinus exhibits no maxillary sinus tenderness and no frontal sinus tenderness.  Mouth/Throat: Uvula is midline, oropharynx is clear and moist and mucous membranes are normal. No oropharyngeal exudate, posterior oropharyngeal edema, posterior oropharyngeal erythema or tonsillar abscesses.  Eyes: Conjunctivae and EOM are normal. Pupils are equal, round, and reactive to light. No scleral icterus.  Neck: Normal range of motion. Neck supple. No thyromegaly present.  Cardiovascular: Normal rate, regular rhythm, normal heart sounds and intact distal pulses.  No murmur heard. Pulmonary/Chest: Effort normal and breath sounds normal. No respiratory distress. She has no wheezes. She has no rales.  Musculoskeletal: She exhibits no edema.  See HPI for foot exam if done  Lymphadenopathy:    She has no cervical adenopathy.  Skin: Skin is warm and dry. No rash noted.  Psychiatric: She has a normal mood and affect.  Nursing note and vitals reviewed.  Results for orders placed or performed in visit on  08/09/17  VITAMIN D 25 Hydroxy (Vit-D Deficiency, Fractures)  Result Value Ref Range   VITD 31.25 30.00 - 100.00 ng/mL  Hemoglobin A1c  Result Value Ref Range   Hgb A1c MFr Bld 6.3 4.6 - 6.5 %  Basic metabolic panel  Result Value Ref Range   Sodium 140 135 - 145 mEq/L   Potassium 3.9 3.5 - 5.1 mEq/L   Chloride 102 96 - 112 mEq/L   CO2 31 19 - 32 mEq/L   Glucose, Bld 144 (H) 70 - 99 mg/dL   BUN 10 6 - 23 mg/dL   Creatinine, Ser 0.75 0.40 - 1.20 mg/dL   Calcium 9.2 8.4 - 10.5 mg/dL   GFR 87.91 >60.00 mL/min      Assessment & Plan:   Problem List Items Addressed This Visit    Barrett's esophagus without dysplasia    H/o this by prior EGD  at [facility].  I never received records of this. Pt states overdue for EGD. Will refer to local GI to establish care.       Relevant Orders   Ambulatory referral to Gastroenterology   Controlled type 2 diabetes mellitus without complication, without long-term current use of insulin ([place]) - Primary    Chronic, significant improvement with healthy diet changes and weight loss. Congratulated. Continue current regimen of tradjenta and amaryl bid. RTC 4 mo f/u visit. Due for eye exam.       GERD (gastroesophageal reflux disease)    Requests dexilant refill. Has previously failed esomeprazole, omeprazole, and pantoprazole. H/o barrett's - see below.       Relevant Medications   dexlansoprazole (DEXILANT) 60 MG capsule   Other Relevant Orders   Ambulatory referral to Gastroenterology   Pain of left heel    Plantar fasciitis improving under care of Dr Louanne Skye.       Severe obesity (BMI >= 40) ([facility])    Congratulated on ongoing weight loss to date. She continues low carb diet. She has cheat days. No hypoglyemia.       Sinus congestion    I don't think she has sinusitis. Recommended continued flonase, mucinex, start allegra D x5 days. Update with effect - if worsening symptoms, fever, or facial pain develops, low threshold to start augmentin.         Other Visit Diagnoses    Nasal congestion           Follow up plan: Return in about 4 months (around 12/14/2017) for follow up visit.  Ria Bush, MD

## 2017-08-16 NOTE — Patient Instructions (Addendum)
You are doing well today! Keep up the good work. Continue current medicines. Return as needed or in 4 months for diabetes follow up visit.  We will refer you to GI for follow up history of barrett's esophagus.

## 2017-08-17 DIAGNOSIS — K2270 Barrett's esophagus without dysplasia: Secondary | ICD-10-CM | POA: Insufficient documentation

## 2017-08-17 NOTE — Assessment & Plan Note (Signed)
H/o this by prior EGD at [facility].  I never received records of this. Pt states overdue for EGD. Will refer to local GI to establish care.

## 2017-08-17 NOTE — Assessment & Plan Note (Signed)
Requests dexilant refill. Has previously failed esomeprazole, omeprazole, and pantoprazole. H/o barrett's - see below.

## 2017-08-17 NOTE — Assessment & Plan Note (Signed)
I don't think she has sinusitis. Recommended continued flonase, mucinex, start allegra D x5 days. Update with effect - if worsening symptoms, fever, or facial pain develops, low threshold to start augmentin.

## 2017-08-17 NOTE — Assessment & Plan Note (Signed)
Plantar fasciitis improving under care of Dr Louanne Skye.

## 2017-08-17 NOTE — Assessment & Plan Note (Signed)
Chronic, significant improvement with healthy diet changes and weight loss. Congratulated. Continue current regimen of tradjenta and amaryl bid. RTC 4 mo f/u visit. Due for eye exam.

## 2017-08-17 NOTE — Assessment & Plan Note (Signed)
Congratulated on ongoing weight loss to date. She continues low carb diet. She has cheat days. No hypoglyemia.

## 2017-09-04 ENCOUNTER — Telehealth: Payer: Self-pay | Admitting: Family Medicine

## 2017-09-04 NOTE — Telephone Encounter (Signed)
Copied from [place] [id number]. Topic: Quick Communication - See Telephone Encounter >> Sep 04, 2017  4:48 PM Boyd Kerbs wrote: CRM for notification. See Telephone encounter for:  Patient is calling wanting to speak to Dr. Darnell Level about medication adjustment.  She said today was not a good day and needs help She is having a lot of Anxiety. She declined talking to nurse triage 09/04/17.
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
## 2017-09-11 ENCOUNTER — Telehealth: Payer: Self-pay | Admitting: Family Medicine

## 2017-09-11 MED ORDER — BUPROPION HCL 100 MG PO TABS: 100.0000 mg | ORAL_TABLET | Freq: Two times a day (BID) | ORAL | 6 refills | Status: DC

## 2017-09-11 NOTE — Telephone Encounter (Signed)
Copied from [place] [id number]. Topic: Quick Communication - See Telephone Encounter >> Sep 11, 2017  9:38 AM Ahmed Prima L wrote: CRM for notification. See Telephone encounter for:   09/11/17.  Pt called and wants Dr. Darnell Level to know that the clonazepam does help, however 3 or 4 o'clock in the afternoon she reaches a point where she starts crying. He advised her to call on 12/24 to let him know how she was doing with the med. She says will the Wellbutrin help with that, or what would she change. [phone]

## 2017-09-11 NOTE — Telephone Encounter (Signed)
Continue klonopin twice daily.  Let's increase wellbutrin to next highest dose- sent to pharmacy.  Update me with effect in 2-3 wks.  Have a merry christmas!

## 2017-09-11 NOTE — Telephone Encounter (Signed)
Spoke with pt relaying message and notifying her rx was sent to pharmacy.  Also, pt states PA for Dexilant expires this month. Asks that we renew it.

## 2017-09-13 ENCOUNTER — Telehealth (INDEPENDENT_AMBULATORY_CARE_PROVIDER_SITE_OTHER): Payer: Self-pay | Admitting: Specialist

## 2017-09-13 ENCOUNTER — Telehealth: Payer: Self-pay

## 2017-09-13 ENCOUNTER — Ambulatory Visit (INDEPENDENT_AMBULATORY_CARE_PROVIDER_SITE_OTHER): Payer: BLUE CROSS/BLUE SHIELD | Admitting: Specialist

## 2017-09-13 NOTE — Telephone Encounter (Addendum)
Please send prior authorization for Diclofence-Misoprostol    [employer] is no longer using [facility]  [phone]

## 2017-09-13 NOTE — Telephone Encounter (Signed)
Started PA for Dexilant 60 mg cap, key:  [id number], PA case ID:  [id number].  Decision pending.  PA approved

## 2017-09-15 ENCOUNTER — Other Ambulatory Visit: Payer: Self-pay | Admitting: Family Medicine

## 2017-09-15 NOTE — Telephone Encounter (Signed)
Last filled:  08/16/17, #60 Last OV:  08/16/17 Next OV:  none

## 2017-09-15 NOTE — Telephone Encounter (Signed)
followup on PA

## 2017-09-15 NOTE — Telephone Encounter (Signed)
IC patient and she says the the PA was denied, she s/w insurance this am and they told her that she would need to have tried/failed 2 other nsaids.  She has not taken any other nsaids per chart.  I have advised her to call them back and see which meds are on their formulary, and call us back to advise and we can have Dr Louanne Skye write Rx from that point.

## 2017-09-17 NOTE — Telephone Encounter (Signed)
Refilled electronically 

## 2017-09-22 ENCOUNTER — Telehealth (INDEPENDENT_AMBULATORY_CARE_PROVIDER_SITE_OTHER): Payer: Self-pay | Admitting: Specialist

## 2017-09-22 NOTE — Telephone Encounter (Signed)
[facility] called saying that prior authorization was needed but also that medication had been denied. She stated patient is looking for alternative covered by her insurance. CB to advise pharmacy

## 2017-09-26 ENCOUNTER — Encounter: Payer: Self-pay | Admitting: Gastroenterology

## 2017-09-26 NOTE — Telephone Encounter (Signed)
I have resubmitted on cover my meds

## 2017-09-27 NOTE — Telephone Encounter (Signed)
Still pending on cover my meds. 

## 2017-09-28 ENCOUNTER — Other Ambulatory Visit: Payer: Self-pay | Admitting: Family Medicine

## 2017-10-02 NOTE — Telephone Encounter (Signed)
I called ins and they advised that it can take till Oct 10, 2017 to get the review completed.----shows on their end it is in clinical review.

## 2017-10-11 NOTE — Telephone Encounter (Signed)
Patients insurance has denied the generic Arthrotec.  Please advise.

## 2017-10-12 ENCOUNTER — Encounter: Payer: Self-pay | Admitting: Family Medicine

## 2017-10-14 MED ORDER — AMOXICILLIN-POT CLAVULANATE 875-125 MG PO TABS: 1.0000 | ORAL_TABLET | Freq: Two times a day (BID) | ORAL | 0 refills | Status: AC

## 2017-10-14 NOTE — Telephone Encounter (Signed)
Sinus congestion, drainage headache since 07/2017 - will Rx augmentin course.

## 2017-10-19 ENCOUNTER — Ambulatory Visit (INDEPENDENT_AMBULATORY_CARE_PROVIDER_SITE_OTHER): Payer: BLUE CROSS/BLUE SHIELD | Admitting: Specialist

## 2017-10-20 HISTORY — PX: ESOPHAGOGASTRODUODENOSCOPY: SHX1529

## 2017-10-29 IMAGING — CT CT HEAD W/O CM
3 series · 16 of 47 positions shown, 19 images · non-contrast
Comparison: 08/09/2007

CLINICAL DATA: Headache, nausea, and light sensitivity since 8 am
this morning.

EXAM:
CT HEAD WITHOUT CONTRAST
TECHNIQUE: Contiguous axial images were obtained from the base of the skull
through the vertex without intravenous contrast.

[Series 2: head wo · axial · 0.41mm/px · z∈[-155,-25]mm · 10 of 32 slices shown, 13 images]
[im 3/32  brain]
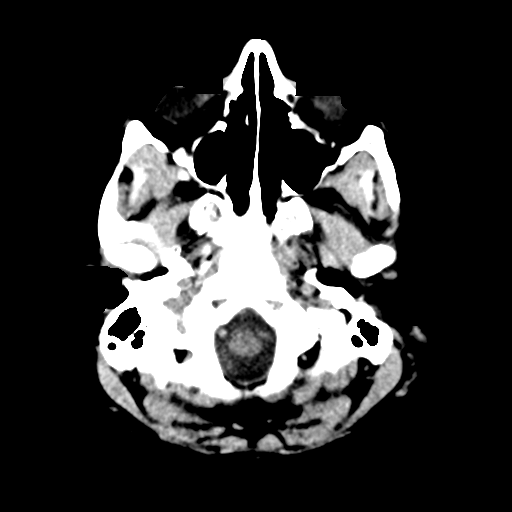
[im 3/32  bone]
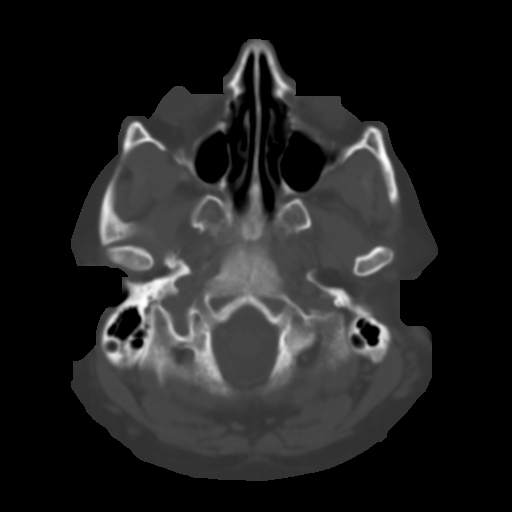
[im 6/32  brain]
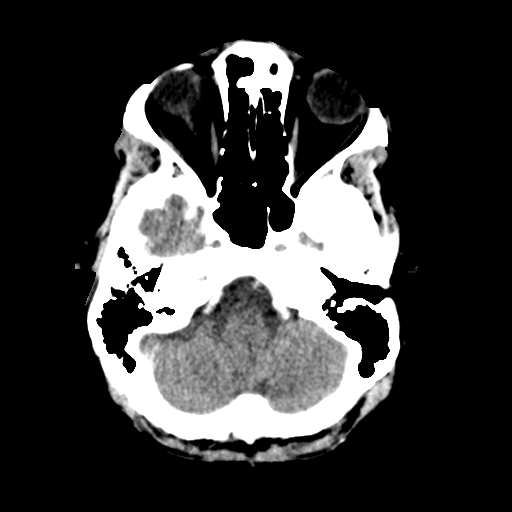
[im 9/32  brain]
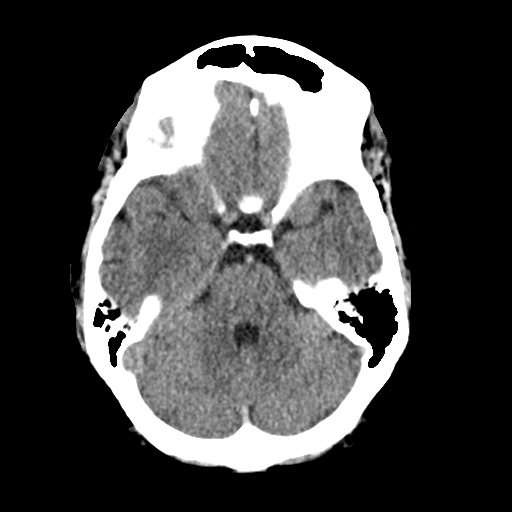
[im 11/32  brain]
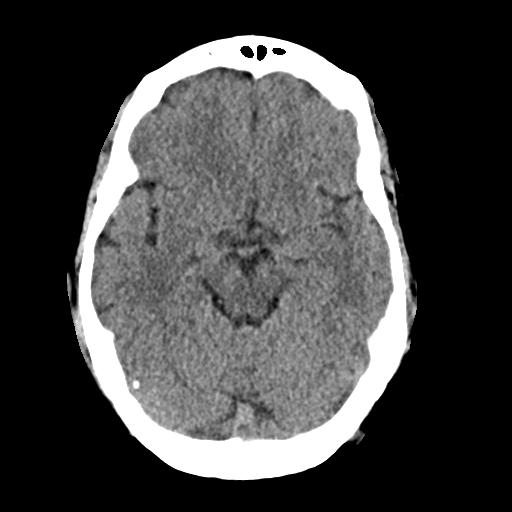
[im 14/32  brain]
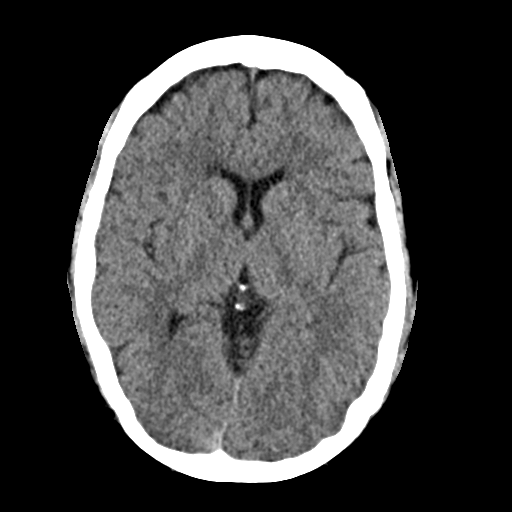
[im 14/32  bone]
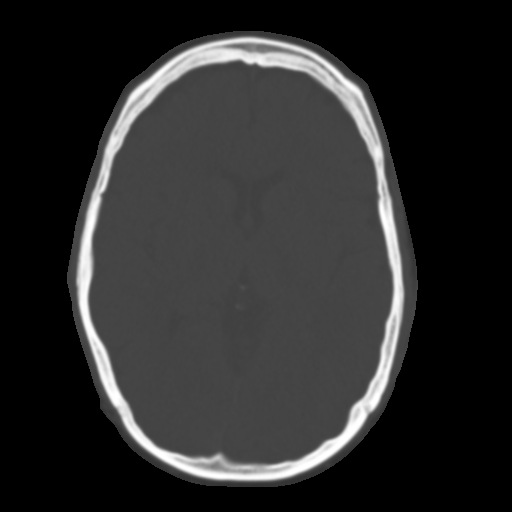
[im 18/32  brain]
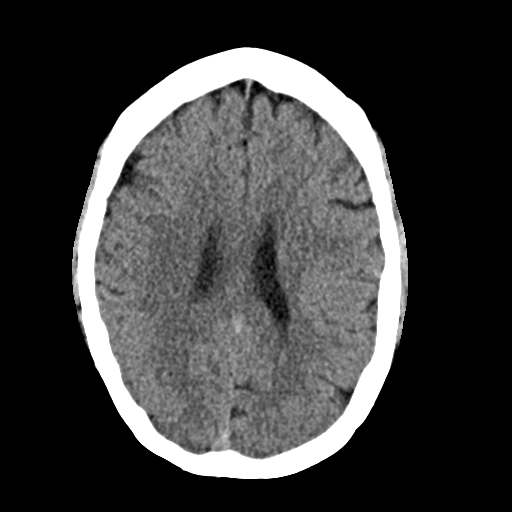
[im 21/32  brain]
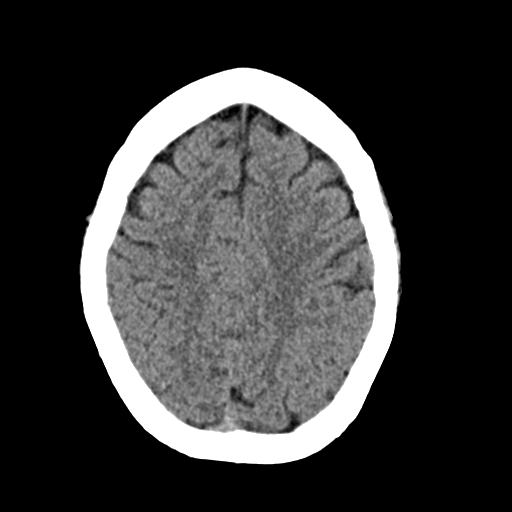
[im 24/32  brain]
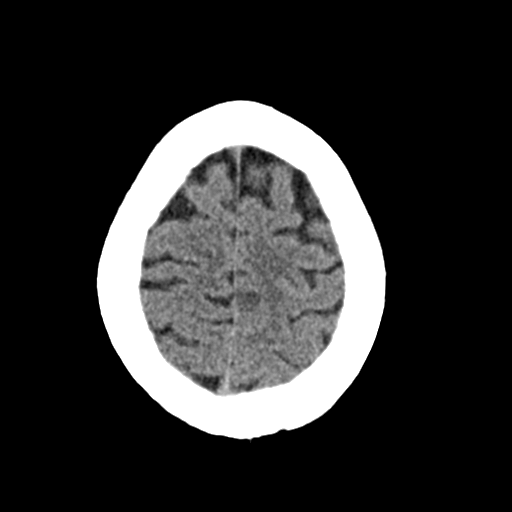
[im 26/32  brain]
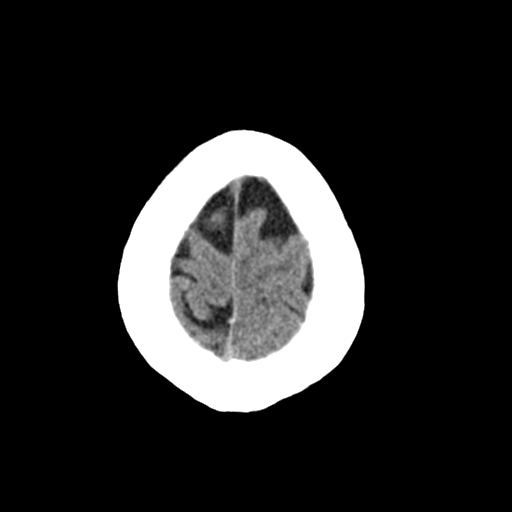
[im 26/32  bone]
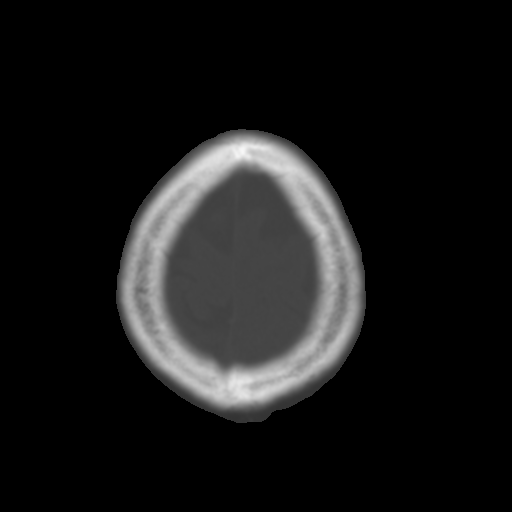
[im 29/32  brain]
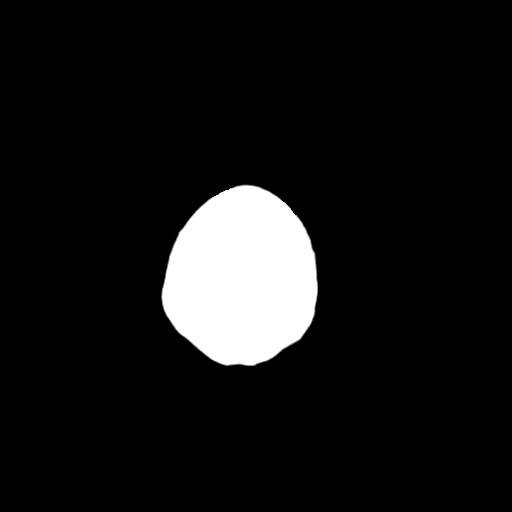

[Series 4: coronal soft · coronal · 0.29mm/px · 3 of 69 slices shown]
[im 23/69  brain]
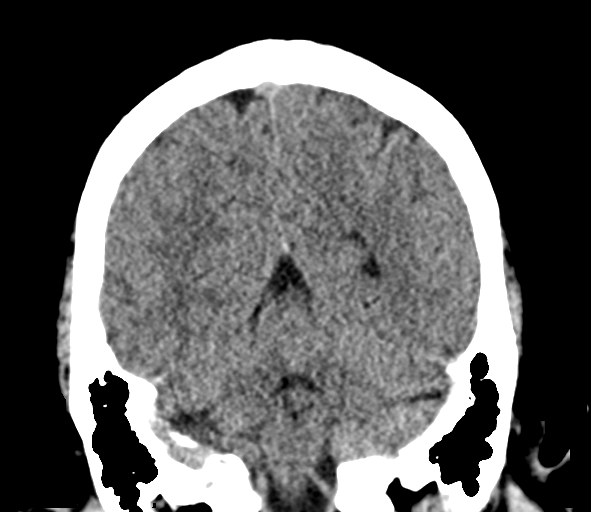
[im 31/69  brain]
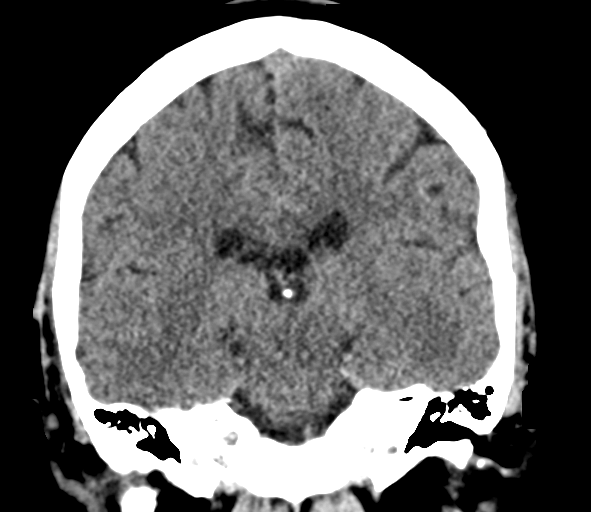
[im 38/69  brain]
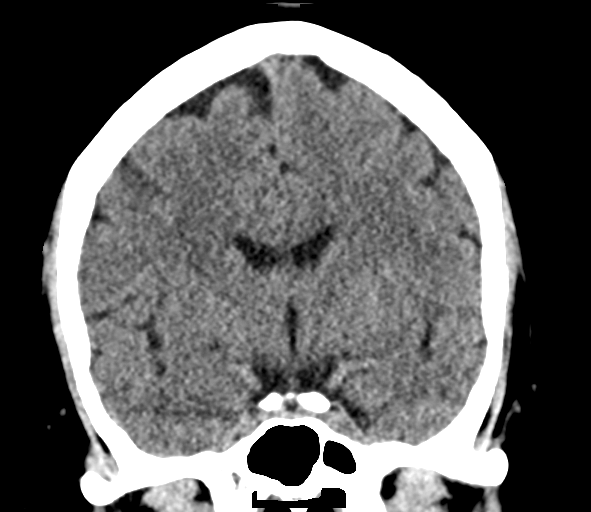

[Series 5: sag soft · sagittal · 0.30mm/px · 3 of 53 slices shown]
[im 18/53  brain]
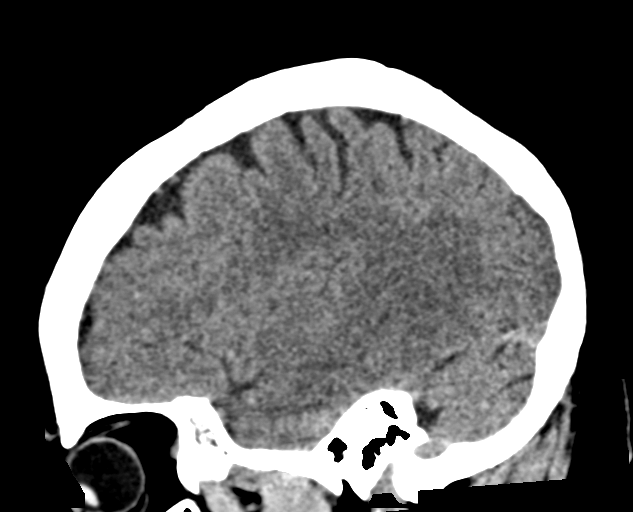
[im 27/53  brain]
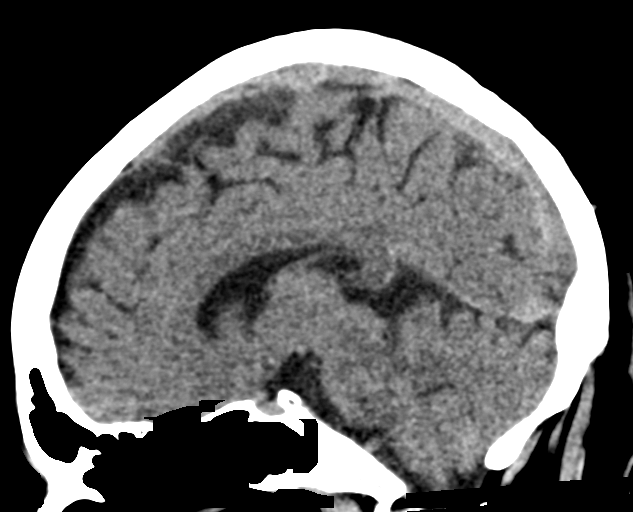
[im 35/53  brain]
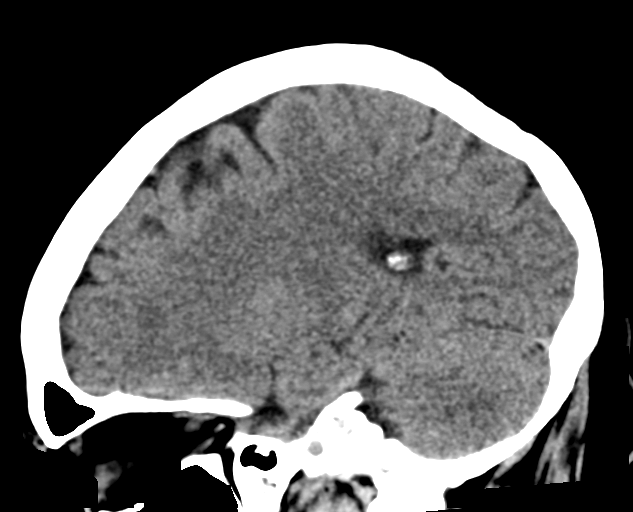

[16 of 47 positions shown; findings below may reference images not displayed]

FINDINGS: Brain: No evidence of acute infarction, hemorrhage, hydrocephalus,
extra-axial collection or mass lesion/mass effect.

Vascular: No hyperdense vessel or unexpected calcification.

Skull: Normal. Negative for fracture or focal lesion.

Sinuses/Orbits: Normal globes and orbits. Visualized sinuses and
mastoid air cells are clear.

Other: None.
IMPRESSION: Normal unenhanced CT scan of the brain.

## 2017-10-30 LAB — HM DIABETES EYE EXAM

## 2017-10-30 LAB — HM DIABETES FOOT EXAM: HM Diabetic Foot Exam: NORMAL

## 2017-10-31 DIAGNOSIS — H524 Presbyopia: Secondary | ICD-10-CM | POA: Diagnosis not present

## 2017-10-31 DIAGNOSIS — H52223 Regular astigmatism, bilateral: Secondary | ICD-10-CM | POA: Diagnosis not present

## 2017-10-31 DIAGNOSIS — E119 Type 2 diabetes mellitus without complications: Secondary | ICD-10-CM | POA: Diagnosis not present

## 2017-10-31 DIAGNOSIS — H5231 Anisometropia: Secondary | ICD-10-CM | POA: Diagnosis not present

## 2017-11-03 ENCOUNTER — Ambulatory Visit: Payer: Self-pay | Admitting: Gastroenterology

## 2017-11-07 ENCOUNTER — Encounter: Payer: Self-pay | Admitting: Gastroenterology

## 2017-11-07 ENCOUNTER — Ambulatory Visit: Payer: BLUE CROSS/BLUE SHIELD | Admitting: Gastroenterology

## 2017-11-07 VITALS — BP 128/70 | HR 84 | Ht 64.0 in | Wt 274.1 lb

## 2017-11-07 DIAGNOSIS — K219 Gastro-esophageal reflux disease without esophagitis: Secondary | ICD-10-CM

## 2017-11-07 DIAGNOSIS — K2270 Barrett's esophagus without dysplasia: Secondary | ICD-10-CM | POA: Diagnosis not present

## 2017-11-07 NOTE — Progress Notes (Signed)
HPI: This is a very pleasant 47 year old woman who was referred to me by Lisa Curry, Lisa Curry  to evaluate history of Barrett's esophagus, GERD.    Chief complaint is chronic GERD, Barrett's esophagus  2011 EGD with Dr. Ferdinand Curry, since then on dexilant once daily. IT helps she still has occasions with pyrosis.  Sometimes occurs after raw broccoli causes belching.  May be something with the quantity of her meals. Worse at bedtime.  She tells me she was contacted by Dr. Ferdinand Curry office about a year ago and told to have follow-up upper endoscopy.  She does not want to follow-up with his office anymore.  She did not have a very good experience with him or with the office.  One example is that she is try to get her records from their office without success, numerous times  She takes zantac 150mg  PRN, maybe twice per month.  Overall her weight up 10 pounds in 2-3 months.  No dsyphagia.  No esophageal cancer in her family.    Old Data Reviewed: None available at the time of this visit.  Review of systems: Pertinent positive and negative review of systems were noted in the above HPI section. All other review negative.   Past Medical History:  Diagnosis Date  . Allergy   . Anxiety   . Barrett esophagus    Lisa Curry, have not received EGD report  . Depression with anxiety   . Diabetes mellitus without complication (Lisa Curry)    DSME 04/2014  . Gastric polyp 05/2012   h/o EGD  . GERD (gastroesophageal reflux disease)   . History of Helicobacter pylori infection   . HLD (hyperlipidemia)   . Hyperlipidemia   . Vitamin D deficiency     Past Surgical History:  Procedure Laterality Date  . CESAREAN SECTION  6/95, 4/98  . DILATION AND CURETTAGE OF UTERUS  2011  . ESOPHAGOGASTRODUODENOSCOPY  2012?   [facility] at [facility]  . LUMBAR DISC SURGERY  03/1995  . NASAL SINUS SURGERY  03/2001    Current Outpatient Medications  Medication Sig Dispense Refill  . atorvastatin (LIPITOR) 20 MG  tablet TAKE 1 TABLET (20 MG TOTAL) BY MOUTH DAILY. 30 tablet 6  . buPROPion (WELLBUTRIN) 100 MG tablet Take 1 tablet (100 mg total) by mouth 2 (two) times daily. 60 tablet 6  . Cholecalciferol (VITAMIN D3) 1000 UNITS CAPS Take 1 capsule (1,000 Units total) by mouth daily. 30 capsule   . clonazePAM (KLONOPIN) 0.5 MG tablet TAKE 1 TABLET BY MOUTH DAILY. MAY TAKE 1 ADDITIONAL TABLET DAILY IF NEEDED 60 tablet 1  . clotrimazole (LOTRIMIN) 1 % cream Apply 1 application topically 2 (two) times daily. 60 g 0  . dexlansoprazole (DEXILANT) 60 MG capsule TAKE 1 CAPSULE (60 MG TOTAL) BY MOUTH DAILY. 30 capsule 11  . glimepiride (AMARYL) 4 MG tablet Take 1 tablet (4 mg total) by mouth 2 (two) times daily. Take with meals 60 tablet 6  . glucose blood (ONE TOUCH ULTRA TEST) test strip Use to check sugar twice daily and as needed. Dx: E11.65 200 each 3  . Multiple Vitamin (MULTIVITAMIN) capsule Take 1 capsule by mouth daily.    . naproxen sodium (ALEVE) 220 MG tablet Take 220-440 mg by mouth at bedtime as needed.    . pioglitazone (ACTOS) 30 MG tablet TAKE 1 TABLET (30 MG TOTAL) BY MOUTH DAILY. 30 tablet 6  . TRADJENTA 5 MG TABS tablet TAKE 1 TABLET (5 MG TOTAL) BY MOUTH DAILY. [place]  tablet 6  . venlafaxine XR (EFFEXOR-XR) 75 MG 24 hr capsule TAKE 3 CAPSULES BY MOUTH DAILY WITH BREAKFAST EVERY MORNING 90 capsule 6  . cyclobenzaprine (FLEXERIL) 10 MG tablet Take 1 tablet (10 mg total) by mouth 2 (two) times daily as needed for muscle spasms (sedation precautions). (Patient not taking: Reported on 11/07/2017) 30 tablet 1  . diclofenac sodium (VOLTAREN) 1 % GEL Apply 2 g topically 4 (four) times daily. (Patient not taking: Reported on 11/07/2017) 3 Tube 3   Current Facility-Administered Medications  Medication Dose Route Frequency Provider Last Rate Last Dose  . diclofenac sodium (VOLTAREN) 1 % transdermal gel 2 g  2 g Topical QID Lisa Curry, Lisa Curry        Allergies as of 11/07/2017 - Review Complete 11/07/2017   Allergen Reaction Noted  . Biaxin [clarithromycin] Hives 11/04/2013  . Metformin and related Diarrhea 03/28/2014    Family History  Problem Relation Age of Onset  . Breast cancer Maternal Grandmother 45  . Heart disease Paternal Grandfather   . Other Sister        Postural Orthostatic Tachycardia Syndrome  . Supraventricular tachycardia Sister   . Other Maternal Aunt        hysterectomy for irregular bleed with abnormal pap  . Hyperlipidemia Mother     Social History   Socioeconomic History  . Marital status: Married    Spouse name: Not on file  . Number of children: 2  . Years of education: Not on file  . Highest education level: Not on file  Social Needs  . Financial resource strain: Not on file  . Food insecurity - worry: Not on file  . Food insecurity - inability: Not on file  . Transportation needs - medical: Not on file  . Transportation needs - non-medical: Not on file  Occupational History  . Occupation: Pharmacist, hospital  Tobacco Use  . Smoking status: Never Smoker  . Smokeless tobacco: Never Used  Substance and Sexual Activity  . Alcohol use: Yes    Alcohol/week: 0.0 oz    Comment: occasional  . Drug use: No  . Sexual activity: Not on file  Other Topics Concern  . Not on file  Social History Narrative   Lives with husband and 2 children, 3 dogs, 2 cats and 2 duck   Occ: special ed Oceanographer.   Activity: no regular exercise   Diet: good water, fruits/vegetables daily     Physical Exam: BP 128/70 (BP Location: Left Arm, Patient Position: Sitting, Cuff Size: Large)   Pulse 84   Ht 5\' 4"  (1.626 m) Comment: height measured without shoes  Wt 274 lb 2 oz (124.3 kg)   LMP 11/01/2017   BMI 47.05 kg/m  Constitutional: generally well-appearing Psychiatric: alert and oriented x3 Eyes: extraocular movements intact Mouth: oral pharynx moist, no lesions Neck: supple no lymphadenopathy Cardiovascular: heart regular rate and rhythm Lungs: clear to  auscultation bilaterally Abdomen: soft, nontender, nondistended, no obvious ascites, no peritoneal signs, normal bowel sounds Extremities: no lower extremity edema bilaterally Skin: no lesions on visible extremities   Assessment and plan: 47 y.o. female with obesity, chronic GERD, history of Barrett's change  First we will try to get records from her previous gastroenterologist Dr. Ferdinand Curry from [place].  His office is notoriously difficult to get records from.  I do believe that she was told that she has Barrett's change.  I recommended repeat upper endoscopy for evaluation since it has been about 8 years.  She will continue on her proton pump inhibitor for now and she will add ranitidine 150 mg pill bedtime since the Dexilant only partially controls her symptoms.  I did explain to her that losing weight has been very well documented to decrease acid related symptoms.    Please see the "Patient Instructions" section for addition details about the plan.   Lisa Loffler, Lisa Curry [facility] 11/07/2017, 3:29 PM  Cc: Lisa Curry, Lisa Curry

## 2017-11-07 NOTE — Patient Instructions (Addendum)
We will get records sent from your previous gastroenterologist (at [facility]) for review.  This will include any endoscopic (colonoscopy or upper endoscopy) procedures and any associated pathology reports.  After reviewing those reports, will decide on necessity/timing of repeat endoscopy.  Continue your dexilant once daily.  Start ranitidine 150mg  pills, one pill at bedtime nightly.   Losing weight has been shown to decrease acid related (GERD) problems.  Normal BMI (Body Mass Index- based on height and weight) is between 23 and 30. Your BMI today is Body mass index is 47.05 kg/m. Marland Kitchen Please consider follow up  regarding your BMI with your Primary Care Provider.

## 2017-11-10 ENCOUNTER — Other Ambulatory Visit: Payer: Self-pay | Admitting: Family Medicine

## 2017-11-14 ENCOUNTER — Encounter: Payer: Self-pay | Admitting: Gastroenterology

## 2017-11-14 ENCOUNTER — Telehealth: Payer: Self-pay | Admitting: Gastroenterology

## 2017-11-14 ENCOUNTER — Other Ambulatory Visit: Payer: Self-pay

## 2017-11-14 ENCOUNTER — Ambulatory Visit (AMBULATORY_SURGERY_CENTER): Payer: BLUE CROSS/BLUE SHIELD | Admitting: Gastroenterology

## 2017-11-14 VITALS — BP 122/56 | HR 85 | Temp 97.1°F | Resp 14 | Ht 64.0 in | Wt 274.0 lb

## 2017-11-14 DIAGNOSIS — K219 Gastro-esophageal reflux disease without esophagitis: Secondary | ICD-10-CM | POA: Diagnosis not present

## 2017-11-14 MED ORDER — SODIUM CHLORIDE 0.9 % IV SOLN: 500.0000 mL | Freq: Once | INTRAVENOUS | Status: DC

## 2017-11-14 NOTE — Op Note (Signed)
[facility] Patient Name: Lisa Curry Procedure Date: 11/14/2017 10:05 AM MRN: [id number] Endoscopist: Milus Banister , MD Age: 47 Referring MD:  Date of Birth: [birth year] Gender: Female Account #: [id number] Procedure:                Upper GI endoscopy Indications:              Surveillance for Barrett's esophagus; EGD 05/2011                            Dr. Virgel Bouquet in [facility]: Indication "GERD"                            Findings "Esophagitis (Location) (6cm).Marland KitchenMarland KitchenPolyp                            (Size, Location, Method) (8-47mm polyp x4, gastric                            body snare (SIC);" Pathology report: at 2cm                            "gastric type mucosa associated with IM consistent                            with Barrett esophagus. Esophageal mucosa                            exhibiting GERD. EoE not identified. Neg for                            dysplasia. (SIC)" at 4cm, 6cm above GE                            "inflammation of gastric type mucosa UNassociated                            with intestinal metaplasia (Barrett esophagus not                            identified)." Stomach, polypectomy "fundic gland                            polyp x 2." Medicines:                Monitored Anesthesia Care Procedure:                Pre-Anesthesia Assessment:                           - Prior to the procedure, a History and Physical                            was performed, and patient medications and  allergies were reviewed. The patient's tolerance of                            previous anesthesia was also reviewed. The risks                            and benefits of the procedure and the sedation                            options and risks were discussed with the patient.                            All questions were answered, and informed consent                            was obtained. Prior Anticoagulants: The patient has                  taken no previous anticoagulant or antiplatelet                            agents. ASA Grade Assessment: II - A patient with                            mild systemic disease. After reviewing the risks                            and benefits, the patient was deemed in                            satisfactory condition to undergo the procedure.                           After obtaining informed consent, the endoscope was                            passed under direct vision. Throughout the                            procedure, the patient's blood pressure, pulse, and                            oxygen saturations were monitored continuously. The                            Endoscope was introduced through the mouth, and                            advanced to the second part of duodenum. The upper                            GI endoscopy was accomplished without difficulty.  The patient tolerated the procedure well. Scope In: Scope Out: Findings:                 The esophagus was normal. Normal Z-line without any                            sign of Barrett's change.                           There were a few small (1-71mm) fundic gland                            appearing polyps in the gastric body.                           The examined duodenum was normal. Complications:            No immediate complications. Estimated blood loss:                            None. Estimated Blood Loss:     Estimated blood loss: none. Impression:               - Normal esohagus (no sign of Barrett's change).                           - Small fundic gland polyps; these were noted                            previously and are not of any clinical concern. Recommendation:           - Patient has a contact number available for                            emergencies. The signs and symptoms of potential                            delayed complications were discussed with the                             patient. Return to normal activities tomorrow.                            Written discharge instructions were provided to the                            patient.                           - Resume previous diet.                           - Continue present medications. Milus Banister, MD 11/14/2017 10:17:26 AM This report has been signed electronically.

## 2017-11-14 NOTE — Progress Notes (Signed)
Spontaneous respirations throughout. VSS. Resting comfortably. To PACU on room air. Report to  RN. 

## 2017-11-14 NOTE — Progress Notes (Signed)
Pt's states no medical or surgical changes since previsit or office visit. 

## 2017-11-14 NOTE — Patient Instructions (Signed)
YOU HAD AN ENDOSCOPIC PROCEDURE TODAY AT THE [facility]:   Refer to the procedure report that was given to you for any specific questions about what was found during the examination.  If the procedure report does not answer your questions, please call your gastroenterologist to clarify.  If you requested that your care partner not be given the details of your procedure findings, then the procedure report has been included in a sealed envelope for you to review at your convenience later.  YOU SHOULD EXPECT: Some feelings of bloating in the abdomen. Passage of more gas than usual.  Walking can help get rid of the air that was put into your GI tract during the procedure and reduce the bloating. If you had a lower endoscopy (such as a colonoscopy or flexible sigmoidoscopy) you may notice spotting of blood in your stool or on the toilet paper. If you underwent a bowel prep for your procedure, you may not have a normal bowel movement for a few days.  Please Note:  You might notice some irritation and congestion in your nose or some drainage.  This is from the oxygen used during your procedure.  There is no need for concern and it should clear up in a day or so.  SYMPTOMS TO REPORT IMMEDIATELY:   Following upper endoscopy (EGD)  Vomiting of blood or coffee ground material  New chest pain or pain under the shoulder blades  Painful or persistently difficult swallowing  New shortness of breath  Fever of 100F or higher  Black, tarry-looking stools  For urgent or emergent issues, a gastroenterologist can be reached at any hour by calling [phone].   DIET:  We do recommend a small meal at first, but then you may proceed to your regular diet.  Drink plenty of fluids but you should avoid alcoholic beverages for 24 hours.  ACTIVITY:  You should plan to take it easy for the rest of today and you should NOT DRIVE or use heavy machinery until tomorrow (because of the sedation medicines used  during the test).    FOLLOW UP: Our staff will call the number listed on your records the next business day following your procedure to check on you and address any questions or concerns that you may have regarding the information given to you following your procedure. If we do not reach you, we will leave a message.  However, if you are feeling well and you are not experiencing any problems, there is no need to return our call.  We will assume that you have returned to your regular daily activities without incident.  If any biopsies were taken you will be contacted by phone or by letter within the next 1-3 weeks.  Please call us at [phone] if you have not heard about the biopsies in 3 weeks.    SIGNATURES/CONFIDENTIALITY: You and/or your care partner have signed paperwork which will be entered into your electronic medical record.  These signatures attest to the fact that that the information above on your After Visit Summary has been reviewed and is understood.  Full responsibility of the confidentiality of this discharge information lies with you and/or your care-partner. 

## 2017-11-14 NOTE — Telephone Encounter (Signed)
EGD 05/2011 Dr. Virgel Bouquet: Indication "GERD" Findings "Esophagitis (Location) (6cm).Marland KitchenMarland KitchenPolyp (Size, Location, Method) (8-19mm polyp x4, gastric body snare (SIC);" Pathology report: 2cm "gastric type mucosa associated with IM consistent with Barrett esophagus.  Esophageal mucosa exhibiting GERD. EoE not identified. Neg for dysplasia. (SIC)" 4cm, 6cm above GE "inflammation of gastric type mucosa UNassociated with intestinal metaplasia (Barrett esophagus not identified)."  Stomach, polypectomy "fundic gland polyp x 2."  Unusual wording to Dr. Ferdinand Lango' report and to the associated path report.  I think she did at least have some Barrett's esophagus with dysplasia.

## 2017-11-15 ENCOUNTER — Telehealth: Payer: Self-pay

## 2017-11-15 ENCOUNTER — Ambulatory Visit (INDEPENDENT_AMBULATORY_CARE_PROVIDER_SITE_OTHER): Payer: Self-pay | Admitting: Specialist

## 2017-11-15 NOTE — Telephone Encounter (Signed)
  Follow up Call-  Call back number 11/14/2017  Post procedure Call Back phone  # [phone]  Permission to leave phone message Yes  Some recent data might be hidden     Patient questions:  Do you have a fever, pain , or abdominal swelling? No. Pain Score  0 *  Have you tolerated food without any problems? Yes.    Have you been able to return to your normal activities? Yes.    Do you have any questions about your discharge instructions: Diet   No. Medications  No. Follow up visit  No.  Do you have questions or concerns about your Care? No.  Actions: * If pain score is 4 or above: No action needed, pain <4.

## 2017-11-16 ENCOUNTER — Other Ambulatory Visit: Payer: Self-pay | Admitting: Family Medicine

## 2017-11-18 ENCOUNTER — Encounter: Payer: Self-pay | Admitting: Family Medicine

## 2017-11-21 ENCOUNTER — Encounter (INDEPENDENT_AMBULATORY_CARE_PROVIDER_SITE_OTHER): Payer: Self-pay | Admitting: Specialist

## 2017-11-21 ENCOUNTER — Ambulatory Visit (INDEPENDENT_AMBULATORY_CARE_PROVIDER_SITE_OTHER): Payer: BLUE CROSS/BLUE SHIELD | Admitting: Specialist

## 2017-11-21 ENCOUNTER — Telehealth (INDEPENDENT_AMBULATORY_CARE_PROVIDER_SITE_OTHER): Payer: Self-pay | Admitting: Radiology

## 2017-11-21 VITALS — BP 129/75 | HR 95 | Ht 64.0 in | Wt 260.0 lb

## 2017-11-21 DIAGNOSIS — M722 Plantar fascial fibromatosis: Secondary | ICD-10-CM | POA: Diagnosis not present

## 2017-11-21 DIAGNOSIS — G8929 Other chronic pain: Secondary | ICD-10-CM

## 2017-11-21 DIAGNOSIS — R29898 Other symptoms and signs involving the musculoskeletal system: Secondary | ICD-10-CM

## 2017-11-21 DIAGNOSIS — M79672 Pain in left foot: Secondary | ICD-10-CM | POA: Diagnosis not present

## 2017-11-21 MED ORDER — DICLOFENAC-MISOPROSTOL 75-0.2 MG PO TBEC: 1.0000 | DELAYED_RELEASE_TABLET | Freq: Two times a day (BID) | ORAL | 0 refills | Status: DC

## 2017-11-21 NOTE — Progress Notes (Signed)
Office Visit Note   Patient: Lisa Curry           Date of Birth: 1971-07-29           MRN: [id number] Visit Date: 11/21/2017              Requested by: Ria Bush, MD [address] [place], [place] [address] PCP: Ria Bush, MD   Assessment & Plan: Visit Diagnoses:  1. Chronic pain of left heel   2. Plantar fasciitis, left     Plan: Plantar fascitis normally responds to decreasing stress on the foot, use of a good arch support and well padded heel. Antiinflamatory agents both transdermal and oral Diclofenac are usually of benefit. With a history of Barretts Esophagus and GERD use of a prostaglandin inhibitor is recommended. We are appealing to BC/BS for permission for Arthrotec generic diclofenac 75 mg with 200 mcg misoprostol.  The reference # [id number].  I have recommended an EMG/NCV of the left leg to assess for foot eversion weakness. I will schedule you a return visit with Dr. Sharol Given, a foot and ankle specialist for consideration of a surgical solution for the left foot plantar fascitis.   Follow-Up Instructions: Return in about 3 weeks (around 12/12/2017), or Dr. Meridee Score on return visit for evaluation and  treatment of recalcitrant left foot plantar fasc.   Orders:  No orders of the defined types were placed in this encounter.  No orders of the defined types were placed in this encounter.     Procedures: No procedures performed   Clinical Data: No additional findings.   Subjective: Chief Complaint  Patient presents with  . Left Foot - Pain    47 year old female with a nearly 6 month history of left heel pain. She has used a night splint and performed stretching exercises and changes shoes without benefit. She reports staying off the foot helps but can not stay inactive. Her insurance company will not approve arthrotec and she has a history of Barrett's esophagus and GERD.     Review of Systems  Constitutional: Negative.   HENT:  Negative.   Eyes: Negative.   Respiratory: Negative.   Cardiovascular: Negative.   Gastrointestinal: Negative.   Endocrine: Negative.   Genitourinary: Negative.   Musculoskeletal: Negative.   Skin: Negative.   Allergic/Immunologic: Negative.   Neurological: Negative.   Hematological: Negative.   Psychiatric/Behavioral: Negative.      Objective: Vital Signs: BP 129/75 (BP Location: Left Arm, Patient Position: Sitting)   Pulse 95   Ht 5\' 4"  (1.626 m)   Wt 260 lb (117.9 kg)   LMP 11/01/2017   BMI 44.63 kg/m   Physical Exam  Constitutional: She is oriented to person, place, and time. She appears well-developed and well-nourished.  HENT:  Head: Normocephalic and atraumatic.  Eyes: EOM are normal. Pupils are equal, round, and reactive to light.  Neck: Normal range of motion. Neck supple.  Pulmonary/Chest: Effort normal and breath sounds normal.  Abdominal: Soft. Bowel sounds are normal.  Musculoskeletal: Normal range of motion.  Neurological: She is alert and oriented to person, place, and time.  Skin: Skin is warm and dry.  Psychiatric: She has a normal mood and affect. Her behavior is normal. Judgment and thought content normal.    Ortho Exam  Specialty Comments:  No specialty comments available.  Imaging: No results found.   PMFS History: Patient Active Problem List   Diagnosis Date Noted  . Barrett's esophagus without  dysplasia 08/17/2017  . Pain of left heel 06/23/2017  . Right shoulder pain 06/23/2017  . Strain of latissimus dorsi muscle 03/20/2017  . Anemia 09/01/2016  . Umbilical hernia 26/33/3545  . Lower back pain 08/16/2016  . Intertrigo 06/04/2016  . Sinus congestion 06/04/2016  . Vitamin D deficiency   . HLD (hyperlipidemia)   . Health maintenance examination 01/20/2015  . Severe obesity (BMI >= 40) ([place]) 11/04/2013  . GERD (gastroesophageal reflux disease) 11/04/2013  . Controlled type 2 diabetes mellitus without complication, without long-term  current use of insulin ([place]) 11/04/2013  . Depression with anxiety 11/04/2013   Past Medical History:  Diagnosis Date  . Allergy   . Anxiety   . Barrett esophagus    Don Bulla, have not received EGD report  . Depression with anxiety   . Diabetes mellitus without complication ([place])    DSME 04/2014  . Gastric polyp 05/2012   h/o EGD  . GERD (gastroesophageal reflux disease)   . History of Helicobacter pylori infection   . HLD (hyperlipidemia)   . Hyperlipidemia   . Vitamin D deficiency     Family History  Problem Relation Age of Onset  . Breast cancer Maternal Grandmother 53  . Heart disease Paternal Grandfather   . Other Sister        Postural Orthostatic Tachycardia Syndrome  . Supraventricular tachycardia Sister   . Other Maternal Aunt        hysterectomy for irregular bleed with abnormal pap  . Hyperlipidemia Mother     Past Surgical History:  Procedure Laterality Date  . CESAREAN SECTION  6/95, 4/98  . DILATION AND CURETTAGE OF UTERUS  2011  . ESOPHAGOGASTRODUODENOSCOPY  2012?   [facility]  . ESOPHAGOGASTRODUODENOSCOPY  10/2017   WNL Ardis Hughs)  . LUMBAR DISC SURGERY  03/1995  . NASAL SINUS SURGERY  03/2001   Social History   Occupational History  . Occupation: Pharmacist, hospital  Tobacco Use  . Smoking status: Never Smoker  . Smokeless tobacco: Never Used  Substance and Sexual Activity  . Alcohol use: Yes    Alcohol/week: 0.0 oz    Comment: occasional  . Drug use: No  . Sexual activity: Not on file

## 2017-11-21 NOTE — Telephone Encounter (Signed)
Dr. Louanne Skye called insurance company and discussed with denial for Arthortec and it has been sent for review, they will let us know in 72 hours if the meds have been approved or denied. Reference # is [id number]

## 2017-11-21 NOTE — Patient Instructions (Signed)
Plantar fascitis normally responds to decreasing stress on the foot, use of a good arch support and well padded heel. Antiinflamatory agents both transdermal and oral Diclofenac are usually of benefit. With a history of Barretts Esophagus and GERD use of a prostaglandin inhibitor is recommended. We are appealing to BC/BS for permission for Arthrotec generic diclofenac 75 mg with 200 mcg misoprostol.  The reference # [id number].  I have recommended an EMG/NCV of the left leg to assess for foot eversion weakness. I will schedule you a return visit with Dr. Sharol Given, a foot and ankle specialist for consideration of a surgical solution for the left foot plantar fascitis.

## 2017-11-30 ENCOUNTER — Other Ambulatory Visit: Payer: Self-pay | Admitting: Family Medicine

## 2017-11-30 NOTE — Telephone Encounter (Signed)
Copied from [place] [id number]. Topic: Quick Communication - Rx Refill/Question >> Nov 30, 2017  5:03 PM Cleaster Corin, [place] wrote: Medication: glucose blood (ONE TOUCH ULTRA TEST) test strip [[id number] One touch delica lancets   Has the patient contacted their pharmacy? yes   (Agent: If no, request that the patient contact the pharmacy for the refill.)  [place], [place] [address] [place] [address] Phone: [phone] Fax: [phone]   Preferred Pharmacy (with phone number or street name):   Agent: Please be advised that RX refills may take up to 3 business days. We ask that you follow-up with your pharmacy.

## 2017-11-30 NOTE — Telephone Encounter (Signed)
Patient called following up on the prior authorization that was done on March 5th.  She is wanting to know if it has been approved.  Thank you.

## 2017-12-01 ENCOUNTER — Other Ambulatory Visit: Payer: Self-pay | Admitting: Family Medicine

## 2017-12-01 ENCOUNTER — Telehealth: Payer: Self-pay | Admitting: Family Medicine

## 2017-12-01 ENCOUNTER — Encounter: Payer: Self-pay | Admitting: Family Medicine

## 2017-12-01 MED ORDER — ONETOUCH DELICA LANCETS 33G MISC: 0 refills | Status: AC

## 2017-12-01 MED ORDER — GLUCOSE BLOOD VI STRP: ORAL_STRIP | 0 refills | Status: DC

## 2017-12-01 NOTE — Telephone Encounter (Signed)
I called and advised rx was denied again by her insurance-----Patient wants to know if she can do the Diclofenac any way since her insurance has denied meds again---Please advise

## 2017-12-01 NOTE — Telephone Encounter (Signed)
Spoke with pt relaying message per Dr. Darnell Level.  Pt verbalizes understanding.  Says she will see how the weekend goes.

## 2017-12-01 NOTE — Telephone Encounter (Signed)
See phone note

## 2017-12-01 NOTE — Telephone Encounter (Signed)
I'm not sure what's causing her symptoms - difficult to tell without exam/discussion. Would recommend supportive care for now, make sure she's staying well hydrated, getting enough restful sleep at night. If ongoing symptoms would suggest OV.

## 2017-12-01 NOTE — Telephone Encounter (Signed)
One Touch Ultra Test test strips.  Also     One touch delica lancets  LOV 16/24/46 with Dr. Danise [name].  Pt was for 4 month f/u.   Don't see where one is scheduled.  (Should be end of March, 2019).  [facility] [address] [place] Phone [phone] Fax [phone]

## 2017-12-01 NOTE — Telephone Encounter (Signed)
Last filled:  10/21/17, # 60 Last OV:  08/16/17 Next OV:  none

## 2017-12-01 NOTE — Telephone Encounter (Signed)
I spoke with pt; pt said she listed her symptoms in the pt email she sent 12/01/17; offered pt some appts after she gets off work and pt said she does not have the copay to schedule an appt and wanted Dr Darnell Level to review her email to see if he thought could be a virus or what he would suggest. Pt request cb after reviewed by Dr Darnell Level.

## 2017-12-01 NOTE — Telephone Encounter (Signed)
Copied from [place] [id number]. Topic: Quick Communication - See Telephone Encounter >> Dec 01, 2017 11:00 AM Ether Griffins B wrote: CRM for notification. See Telephone encounter for:  Pt calling in to make sure Dr. Danise [name] sees her mychart message she sent in. 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  12/01/17.

## 2017-12-01 NOTE — Telephone Encounter (Signed)
Unable to reach pt by phone; per DPR left v/m that onetouch test strips [facility] and pt needs to call and schedule 4 mth f/u appt end of March 2019.

## 2017-12-02 NOTE — Telephone Encounter (Signed)
Eprescribed.

## 2017-12-05 ENCOUNTER — Telehealth: Payer: Self-pay | Admitting: Family Medicine

## 2017-12-05 DIAGNOSIS — E119 Type 2 diabetes mellitus without complications: Secondary | ICD-10-CM

## 2017-12-05 NOTE — Telephone Encounter (Signed)
Copied from [place]. Topic: Quick Communication - See Telephone Encounter >> Dec 05, 2017  8:55 AM Clack, Laban Emperor wrote: CRM for notification. See Telephone encounter for:  Pt would like to come in be check for her diabetes but would like to her labs done 1st. Please check with PCP to have orders put in.  Please f/u with pt.  12/05/17.

## 2017-12-06 NOTE — Telephone Encounter (Signed)
Left message on vm per dpr notifying pt labs she requested are ordered.  Asked pt to call back to schedule lab visit.

## 2017-12-06 NOTE — Telephone Encounter (Signed)
plz notify labs ordered. Thanks.

## 2017-12-11 ENCOUNTER — Other Ambulatory Visit (INDEPENDENT_AMBULATORY_CARE_PROVIDER_SITE_OTHER): Payer: BLUE CROSS/BLUE SHIELD

## 2017-12-11 DIAGNOSIS — E119 Type 2 diabetes mellitus without complications: Secondary | ICD-10-CM | POA: Diagnosis not present

## 2017-12-11 LAB — HEMOGLOBIN A1C: Hgb A1c MFr Bld: 6.3 % (ref 4.6–6.5)

## 2017-12-11 LAB — BASIC METABOLIC PANEL
BUN: 8 mg/dL (ref 6–23)
CO2: 30 mEq/L (ref 19–32)
Calcium: 9.2 mg/dL (ref 8.4–10.5)
Chloride: 99 mEq/L (ref 96–112)
Creatinine, Ser: 0.75 mg/dL (ref 0.40–1.20)
GFR: 87.79 mL/min (ref >60.00)
Glucose, Bld: 104 mg/dL — ABNORMAL HIGH (ref 70–99)
Potassium: 3.4 mEq/L — ABNORMAL LOW (ref 3.5–5.1)
Sodium: 138 mEq/L (ref 135–145)

## 2017-12-15 ENCOUNTER — Encounter: Payer: Self-pay | Admitting: Family Medicine

## 2017-12-15 ENCOUNTER — Ambulatory Visit: Payer: BLUE CROSS/BLUE SHIELD | Admitting: Family Medicine

## 2017-12-15 VITALS — BP 126/72 | HR 96 | Temp 97.9°F | Wt 273.0 lb

## 2017-12-15 DIAGNOSIS — E119 Type 2 diabetes mellitus without complications: Secondary | ICD-10-CM | POA: Diagnosis not present

## 2017-12-15 DIAGNOSIS — K2270 Barrett's esophagus without dysplasia: Secondary | ICD-10-CM | POA: Diagnosis not present

## 2017-12-15 DIAGNOSIS — L304 Erythema intertrigo: Secondary | ICD-10-CM | POA: Diagnosis not present

## 2017-12-15 DIAGNOSIS — K219 Gastro-esophageal reflux disease without esophagitis: Secondary | ICD-10-CM

## 2017-12-15 MED ORDER — GLUCOSE BLOOD VI STRP: ORAL_STRIP | 3 refills | Status: DC

## 2017-12-15 MED ORDER — GLIMEPIRIDE 4 MG PO TABS: 4.0000 mg | ORAL_TABLET | Freq: Two times a day (BID) | ORAL | 9 refills | Status: DC

## 2017-12-15 MED ORDER — ONETOUCH DELICA LANCING DEV MISC: 0 refills | Status: AC

## 2017-12-15 MED ORDER — ATORVASTATIN CALCIUM 20 MG PO TABS: 20.0000 mg | ORAL_TABLET | Freq: Every day | ORAL | 6 refills | Status: DC

## 2017-12-15 MED ORDER — NYSTATIN 100000 UNIT/GM EX CREA: 1.0000 "application " | TOPICAL_CREAM | Freq: Two times a day (BID) | CUTANEOUS | 1 refills | Status: DC

## 2017-12-15 NOTE — Assessment & Plan Note (Signed)
Chronic, stable. Continue current regimen. Foot exam today.  

## 2017-12-15 NOTE — Assessment & Plan Note (Signed)
No signs of barrett's on latest EGD by [facility] - will resolve.

## 2017-12-15 NOTE — Assessment & Plan Note (Signed)
Persistent despite clotrimazole cream. Will start nystatin cream and continue gold bond medicated powder

## 2017-12-15 NOTE — Progress Notes (Signed)
BP 126/72 (BP Location: Left Arm, Patient Position: Sitting, Cuff Size: Large)   Pulse 96   Temp 97.9 F (36.6 C) (Oral)   Wt 273 lb (123.8 kg)   LMP 11/28/2017   SpO2 98%   BMI 46.86 kg/m    CC: 4 mo f/u visit Subjective:    Patient ID: Lisa Curry, female    DOB: 1970-12-02, 47 y.o.   MRN: [id number]  HPI: Lisa Curry is a 47 y.o. 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 Lots of family medical stressors.   Had been following low carb diet "reversing diabetes". Weight up 10lbs since last visit.   DM - does regularly check sugars and brings log: 123 fasting this morning. Compliant with antihyperglycemic regimen which includes: tradjenta 5mg  daily, amaryl 4mg  bid, actos 30mg  daily. Denies low sugars or hypoglycemic symptoms. Denies paresthesias. Last diabetic eye exam: 10/2017. Pneumovax: 06/2014. Prevnar: not due yet. Glucometer brand: [employer]. DSME: due. Lab Results  Component Value Date   HGBA1C 6.3 12/11/2017   Diabetic Foot Exam - Simple   Simple Foot Form Diabetic Foot exam was performed with the following findings:  Yes 12/15/2017  9:50 AM  Visual Inspection No deformities, no ulcerations, no other skin breakdown bilaterally:  Yes Sensation Testing Intact to touch and monofilament testing bilaterally:  Yes Pulse Check Posterior Tibialis and Dorsalis pulse intact bilaterally:  Yes Comments    Lab Results  Component Value Date   MICROALBUR 1.2 04/18/2017     Saw GI - EGD WNL without barrett's esophagus. Ranitidine was added at night time.   Worried she has yeast infection to groin area - burning pain with rash. Tried gold bond medicated anti-fungal. Has tried clotrimazole without benefit. Odor present   Relevant past medical, surgical, family and social history  reviewed and updated as indicated. Interim medical history since our last visit reviewed. Allergies and medications reviewed and updated. Outpatient Medications Prior to Visit  Medication Sig Dispense Refill  . buPROPion (WELLBUTRIN) 100 MG tablet Take 1 tablet (100 mg total) by mouth 2 (two) times daily. 60 tablet 6  . Cholecalciferol (VITAMIN D3) 1000 UNITS CAPS Take 1 capsule (1,000 Units total) by mouth daily. 30 capsule   . clonazePAM (KLONOPIN) 0.5 MG tablet TAKE 1 TABLET BY MOUTH DAILY. MAY TAKE 1 ADDITIONAL TABLET DAILY IF NEEDED 60 tablet 1  . clotrimazole (LOTRIMIN) 1 % cream Apply 1 application topically 2 (two) times daily. 60 g 0  . cyclobenzaprine (FLEXERIL) 10 MG tablet Take 1 tablet (10 mg total) by mouth 2 (two) times daily as needed for muscle spasms (sedation precautions). 30 tablet 1  . dexlansoprazole (DEXILANT) 60 MG capsule TAKE 1 CAPSULE (60 MG TOTAL) BY MOUTH DAILY. 30 capsule 11  . Multiple Vitamin (MULTIVITAMIN) capsule Take 1 capsule by mouth daily.    . naproxen sodium (ALEVE) 220 MG tablet Take 220-440 mg by mouth at bedtime as needed.    Glory Rosebush DELICA LANCETS 90W MISC Check blood sugar twice daily and as directed. Dx E11.65 200 each 0  . pioglitazone (ACTOS) 30 MG tablet TAKE 1 TABLET (30 MG TOTAL) BY MOUTH DAILY. 30 tablet 1  . ranitidine (ZANTAC) 150 MG tablet Take 150 mg by mouth at bedtime.    . TRADJENTA 5 MG TABS  tablet TAKE 1 TABLET (5 MG TOTAL) BY MOUTH DAILY. 30 tablet 0  . venlafaxine XR (EFFEXOR-XR) 75 MG 24 hr capsule TAKE 3 CAPSULES BY MOUTH DAILY WITH BREAKFAST EVERY MORNING 90 capsule 1  . atorvastatin (LIPITOR) 20 MG tablet TAKE 1 TABLET (20 MG TOTAL) BY MOUTH DAILY. 30 tablet 6  . glimepiride (AMARYL) 4 MG tablet TAKE 2 TABLETS (8 MG TOTAL) BY MOUTH DAILY WITH BREAKFAST. 60 tablet 9  . glucose blood (ONE TOUCH ULTRA TEST) test strip Use to check sugar twice daily and as needed. Dx: E11.65 200 each 0  . diclofenac sodium (VOLTAREN) 1 % GEL Apply  2 g topically 4 (four) times daily. 3 Tube 3  . Diclofenac-miSOPROStol 75-0.2 MG TBEC Take 1 tablet by mouth 2 (two) times daily before a meal. 60 tablet 0   Facility-Administered Medications Prior to Visit  Medication Dose Route Frequency Provider Last Rate Last Dose  . 0.9 %  sodium chloride infusion  500 mL Intravenous Once Milus Banister, MD      . diclofenac sodium (VOLTAREN) 1 % transdermal gel 2 g  2 g Topical QID Jessy Oto, MD         Per HPI unless specifically indicated in ROS section below Review of Systems     Objective:    BP 126/72 (BP Location: Left Arm, Patient Position: Sitting, Cuff Size: Large)   Pulse 96   Temp 97.9 F (36.6 C) (Oral)   Wt 273 lb (123.8 kg)   LMP 11/28/2017   SpO2 98%   BMI 46.86 kg/m   Wt Readings from Last 3 Encounters:  12/15/17 273 lb (123.8 kg)  11/21/17 260 lb (117.9 kg)  11/14/17 274 lb (124.3 kg)    Physical Exam  Constitutional: She appears well-developed and well-nourished. No distress.  HENT:  Head: Normocephalic and atraumatic.  Right Ear: External ear normal.  Left Ear: External ear normal.  Nose: Nose normal.  Mouth/Throat: Oropharynx is clear and moist. No oropharyngeal exudate.  Eyes: Pupils are equal, round, and reactive to light. Conjunctivae and EOM are normal. No scleral icterus.  Neck: Normal range of motion. Neck supple.  Cardiovascular: Normal rate, regular rhythm, normal heart sounds and intact distal pulses.  No murmur heard. Pulmonary/Chest: Effort normal and breath sounds normal. No respiratory distress. She has no wheezes. She has no rales.  Musculoskeletal: She exhibits no edema.  See HPI for foot exam if done  Lymphadenopathy:    She has no cervical adenopathy.  Skin: Skin is warm and dry. Rash noted. There is erythema.  Mild erythematous rash below abodminal pannus and into groin with mild maceration of skin  Psychiatric: She has a normal mood and affect.  Nursing note and vitals  reviewed.  Results for orders placed or performed in visit on 12/15/17  HM DIABETES FOOT EXAM  Result Value Ref Range   HM Diabetic Foot Exam [place] - normal   HM DIABETES EYE EXAM  Result Value Ref Range   HM Diabetic Eye Exam No Retinopathy No Retinopathy      Assessment & Plan:   Problem List Items Addressed This Visit    RESOLVED: Barrett's esophagus without dysplasia    No signs of barrett's on latest EGD by [facility] - will resolve.       Controlled type 2 diabetes mellitus without complication, without long-term current use of insulin ([facility]) - Primary    Chronic, stable. Continue current regimen.  Foot exam today.  Relevant Medications   glimepiride (AMARYL) 4 MG tablet   atorvastatin (LIPITOR) 20 MG tablet   GERD (gastroesophageal reflux disease)    Continue dexilant, with nightly ranitidine.       Relevant Medications   ranitidine (ZANTAC) 150 MG tablet   Intertrigo    Persistent despite clotrimazole cream. Will start nystatin cream and continue gold bond medicated powder      Severe obesity (BMI >= 40) (HCC)    Encouraged continued effort at weight loss through healthy diet - she has fallen off this recently.       Relevant Medications   glimepiride (AMARYL) 4 MG tablet       Meds ordered this encounter  Medications  . glimepiride (AMARYL) 4 MG tablet    Sig: Take 1 tablet (4 mg total) by mouth 2 (two) times daily.    Dispense:  60 tablet    Refill:  9  . atorvastatin (LIPITOR) 20 MG tablet    Sig: Take 1 tablet (20 mg total) by mouth daily at 6 PM.    Dispense:  30 tablet    Refill:  6  . Lancet Devices (ONE TOUCH DELICA LANCING DEV) MISC    Sig: Use as directed with lancets and one touch meter E11.65    Dispense:  1 each    Refill:  0  . glucose blood (ONE TOUCH ULTRA TEST) test strip    Sig: Use to check sugar twice daily and as needed. Dx: E11.65    Dispense:  300 each    Refill:  3  . nystatin cream (MYCOSTATIN)    Sig: Apply 1  application topically 2 (two) times daily.    Dispense:  60 g    Refill:  1   Orders Placed This Encounter  Procedures  . HM DIABETES FOOT EXAM    This external order was created through the Results Console.  Marland Kitchen HM DIABETES EYE EXAM    This external order was created through the Results Console.    Follow up plan: Return in about 4 months (around 04/16/2018) for follow up visit.  Ria Bush, MD

## 2017-12-15 NOTE — Patient Instructions (Addendum)
[facility].  Sugars are doing great! Continue current medicines.  Return after 8/6 for physical.  Try nystatin for fungal rash

## 2017-12-15 NOTE — Assessment & Plan Note (Signed)
Encouraged continued effort at weight loss through healthy diet - she has fallen off this recently.

## 2017-12-15 NOTE — Assessment & Plan Note (Signed)
Continue dexilant, with nightly ranitidine.

## 2017-12-19 ENCOUNTER — Ambulatory Visit (INDEPENDENT_AMBULATORY_CARE_PROVIDER_SITE_OTHER): Payer: BLUE CROSS/BLUE SHIELD | Admitting: Orthopedic Surgery

## 2017-12-27 ENCOUNTER — Other Ambulatory Visit: Payer: Self-pay | Admitting: Family Medicine

## 2018-01-10 ENCOUNTER — Other Ambulatory Visit: Payer: Self-pay | Admitting: Family Medicine

## 2018-01-10 MED ORDER — PIOGLITAZONE HCL 30 MG PO TABS: 30.0000 mg | ORAL_TABLET | Freq: Every day | ORAL | 5 refills | Status: DC

## 2018-01-10 NOTE — Telephone Encounter (Signed)
Copied from [place] [id number]. Topic: Inquiry >> Jan 10, 2018 12:57 PM Pricilla Handler wrote: Reason for CRM: Patient called requesting refills of both Pioglitazone (ACTOS) 30 MG tablet and Venlafaxine XR (EFFEXOR-XR) 75 MG 24 hr capsule. Patient contacted her pharmacy. The pharmacy stated that they sent over the requests for these medications last month, but that our office replied that we would not refill the requests at that time. [facility]'s preferred [facility] is [id number] [place], [place] [employer] [phone] (Phone) [phone] (Fax).       Thank You!!!

## 2018-01-10 NOTE — Telephone Encounter (Signed)
I spoke with Lisa Curry and she was seen on 12/15/17 with labs prior to visit; refilled actos per protocol; did not see where venlafaxine XR was discussed.Please advise if OK to refill Venlafaxine XR; Lisa Curry is not out of med. [facility] [place].. Lisa Curry also said received nystatin cream at 3 /29/19 visit for yeast and it helped for a while but now having burning ? Rash under breast and Lisa Curry request diflucan to [facility] [place].Please advise.

## 2018-01-12 MED ORDER — VENLAFAXINE HCL ER 75 MG PO CP24: ORAL_CAPSULE | ORAL | 3 refills | Status: DC

## 2018-01-12 MED ORDER — FLUCONAZOLE 150 MG PO TABS: 150.0000 mg | ORAL_TABLET | Freq: Once | ORAL | 0 refills | Status: AC

## 2018-01-12 NOTE — Telephone Encounter (Addendum)
plz notify sent in as well as diflucan

## 2018-01-12 NOTE — Addendum Note (Signed)
Addended by: Ria Bush on: 01/12/2018 01:59 PM   Modules accepted: Orders

## 2018-01-12 NOTE — Telephone Encounter (Signed)
Patient calling checking status, please advise [phone]

## 2018-02-22 ENCOUNTER — Telehealth: Payer: Self-pay | Admitting: Family Medicine

## 2018-02-22 ENCOUNTER — Other Ambulatory Visit: Payer: Self-pay

## 2018-02-22 MED ORDER — BUPROPION HCL 100 MG PO TABS: 100.0000 mg | ORAL_TABLET | Freq: Two times a day (BID) | ORAL | 3 refills | Status: DC

## 2018-02-22 NOTE — Telephone Encounter (Signed)
Spoke with pt informing her we received refill request today and Dr. Darnell Level is working on it for her. States she is just anxious to get it since she is very low on pills.

## 2018-02-22 NOTE — Telephone Encounter (Signed)
Received fax from [facility] stating pt requests 90-day rx for bupropion.  Looks like rx was last discussed at last CPE on 04/24/17.  Only see one time rx was for 90 days.  Next OV:  none

## 2018-02-22 NOTE — Telephone Encounter (Signed)
Refilled

## 2018-02-22 NOTE — Telephone Encounter (Signed)
Duplicate encounter

## 2018-02-22 NOTE — Telephone Encounter (Signed)
Patient calling checking status, states pharmacy told her refill was sent to office 1 week ago. Only has 1 pill left, very concerned on what to do with no meds. Wants to know if she can increase her clonazePAM (KLONOPIN) 0.5 MG tablet until refill in approved. Very concerned. Would like a call back today.

## 2018-02-22 NOTE — Telephone Encounter (Signed)
Copied from [place] [id number]. Topic: Quick Communication - Rx Refill/Question >> Feb 22, 2018  9:12 AM Celedonio Savage L wrote: Medication: buPROPion (WELLBUTRIN) 100 MG tablet  90 day refill due to insurance  Has the patient contacted their pharmacy? Yes.  A week ago   faxed over a request (Agent: If no, request that the patient contact the pharmacy for the refill.) (Agent: If yes, when and what did the pharmacy advise?)  Preferred Pharmacy (with phone number or street name): [id number] [place], [place] [phone] (Phone) [phone] (Fax)      Agent: Please be advised that RX refills may take up to 3 business days. We ask that you follow-up with your pharmacy.       Pt would like a call back when sent to pharmacy

## 2018-02-22 NOTE — Telephone Encounter (Signed)
See refill encounter for 02/22/18

## 2018-02-22 NOTE — Telephone Encounter (Unsigned)
Copied from [place] [id number]. Topic: Quick Communication - Rx Refill/Question >> Feb 22, 2018  1:16 PM Stovall, Shana A wrote: Medication: buPROPion (WELLBUTRIN) 100 MG tablet [[id number]]    Has the patient contacted their pharmacy? no (Agent: If no, request that the patient contact the pharmacy for the refill.) (Agent: If yes, when and what did the pharmacy advise?)  Preferred Pharmacy (with phone number or street [facility] [place] [facility] , ins will only cove a 90 day supply  Agent: Please be advised that RX refills may take up to 3 business days. We ask that you follow-up with your pharmacy.

## 2018-02-23 ENCOUNTER — Other Ambulatory Visit: Payer: Self-pay | Admitting: Family Medicine

## 2018-02-26 ENCOUNTER — Telehealth: Payer: Self-pay | Admitting: *Deleted

## 2018-02-26 MED ORDER — VENLAFAXINE HCL ER 225 MG PO TB24: 225.0000 mg | ORAL_TABLET | Freq: Every day | ORAL | 1 refills | Status: DC

## 2018-02-26 NOTE — Telephone Encounter (Signed)
I have refilled 225mg  dose.

## 2018-02-26 NOTE — Telephone Encounter (Signed)
Copied from [place] [id number]. Topic: General - Other >> Feb 26, 2018  7:38 AM Yvette Rack wrote: Reason for CRM:  [id number] [place] [place] [phone] (Phone) [phone]   calling stating that the pt insurance want cover 3 caps a day but will cover the 1 cap of venlafaxine XR (EFFEXOR-XR) 225mg  capsule a day please send a new RX for this

## 2018-04-02 ENCOUNTER — Other Ambulatory Visit: Payer: Self-pay | Admitting: Family Medicine

## 2018-04-02 NOTE — Telephone Encounter (Signed)
Name of Medication:  Clonazepam Name of Pharmacy[facility]  [facility] [place] or Written Date and Quantity:  02/28/18, #60 Last Office Visit and Type: 12/15/17, f/u Next Office Visit and Type: none Last Controlled Substance Agreement Date: 04/24/17 Last UDS: 10/16/14

## 2018-04-05 NOTE — Telephone Encounter (Signed)
Eprescribed.

## 2018-05-02 ENCOUNTER — Other Ambulatory Visit: Payer: Self-pay | Admitting: Family Medicine

## 2018-05-03 ENCOUNTER — Ambulatory Visit: Payer: BLUE CROSS/BLUE SHIELD | Admitting: Neurology

## 2018-05-16 ENCOUNTER — Encounter: Payer: Self-pay | Admitting: Family Medicine

## 2018-05-16 ENCOUNTER — Ambulatory Visit (INDEPENDENT_AMBULATORY_CARE_PROVIDER_SITE_OTHER): Payer: BLUE CROSS/BLUE SHIELD | Admitting: Family Medicine

## 2018-05-16 ENCOUNTER — Other Ambulatory Visit (HOSPITAL_COMMUNITY)
Admission: RE | Admit: 2018-05-16 | Discharge: 2018-05-16 | Disposition: A | Discharge status: Home / Self Care | Payer: BLUE CROSS/BLUE SHIELD | Source: Ambulatory Visit | Attending: Family Medicine | Admitting: Family Medicine

## 2018-05-16 VITALS — BP 122/70 | HR 92 | Temp 97.8°F | Ht 63.5 in | Wt 289.2 lb

## 2018-05-16 DIAGNOSIS — F331 Major depressive disorder, recurrent, moderate: Secondary | ICD-10-CM

## 2018-05-16 DIAGNOSIS — Z6841 Body Mass Index (BMI) 40.0 and over, adult: Secondary | ICD-10-CM

## 2018-05-16 DIAGNOSIS — Z0000 Encounter for general adult medical examination without abnormal findings: Secondary | ICD-10-CM | POA: Insufficient documentation

## 2018-05-16 DIAGNOSIS — M545 Low back pain, unspecified: Secondary | ICD-10-CM

## 2018-05-16 DIAGNOSIS — E785 Hyperlipidemia, unspecified: Secondary | ICD-10-CM

## 2018-05-16 DIAGNOSIS — E119 Type 2 diabetes mellitus without complications: Secondary | ICD-10-CM

## 2018-05-16 DIAGNOSIS — J0110 Acute frontal sinusitis, unspecified: Secondary | ICD-10-CM

## 2018-05-16 DIAGNOSIS — E559 Vitamin D deficiency, unspecified: Secondary | ICD-10-CM | POA: Diagnosis not present

## 2018-05-16 NOTE — Patient Instructions (Addendum)
Call breast center to schedule screening mammogram. Labs today.  Hang in there! Return in 4 months for follow up visit.  Health Maintenance, Female Adopting a healthy lifestyle and getting preventive care can go a long way to promote health and wellness. Talk with your health care provider about what schedule of regular examinations is right for you. This is a good chance for you to check in with your provider about disease prevention and staying healthy. In between checkups, there are plenty of things you can do on your own. Experts have done a lot of research about which lifestyle changes and preventive measures are most likely to keep you healthy. Ask your health care provider for more information. Weight and diet Eat a healthy diet  Be sure to include plenty of vegetables, fruits, low-fat dairy products, and lean protein.  Do not eat a lot of foods high in solid fats, added sugars, or salt.  Get regular exercise. This is one of the most important things you can do for your health. ? Most adults should exercise for at least 150 minutes each week. The exercise should increase your heart rate and make you sweat (moderate-intensity exercise). ? Most adults should also do strengthening exercises at least twice a week. This is in addition to the moderate-intensity exercise.  Maintain a healthy weight  Body mass index (BMI) is a measurement that can be used to identify possible weight problems. It estimates body fat based on height and weight. Your health care provider can help determine your BMI and help you achieve or maintain a healthy weight.  For females 108 years of age and older: ? A BMI below 18.5 is considered underweight. ? A BMI of 18.5 to 24.9 is normal. ? A BMI of 25 to 29.9 is considered overweight. ? A BMI of 30 and above is considered obese.  Watch levels of cholesterol and blood lipids  You should start having your blood tested for lipids and cholesterol at 48 years of age,  then have this test every 5 years.  You may need to have your cholesterol levels checked more often if: ? Your lipid or cholesterol levels are high. ? You are older than 48 years of age. ? You are at high risk for heart disease.  Cancer screening Lung Cancer  Lung cancer screening is recommended for adults 98-38 years old who are at high risk for lung cancer because of a history of smoking.  A yearly low-dose CT scan of the lungs is recommended for people who: ? Currently smoke. ? Have quit within the past 15 years. ? Have at least a 30-pack-year history of smoking. A pack year is smoking an average of one pack of cigarettes a day for 1 year.  Yearly screening should continue until it has been 15 years since you quit.  Yearly screening should stop if you develop a health problem that would prevent you from having lung cancer treatment.  Breast Cancer  Practice breast self-awareness. This means understanding how your breasts normally appear and feel.  It also means doing regular breast self-exams. Let your health care provider know about any changes, no matter how small.  If you are in your 20s or 30s, you should have a clinical breast exam (CBE) by a health care provider every 1-3 years as part of a regular health exam.  If you are 93 or older, have a CBE every year. Also consider having a breast X-ray (mammogram) every year.  If you have  a family history of breast cancer, talk to your health care provider about genetic screening.  If you are at high risk for breast cancer, talk to your health care provider about having an MRI and a mammogram every year.  Breast cancer gene (BRCA) assessment is recommended for women who have family members with BRCA-related cancers. BRCA-related cancers include: ? Breast. ? Ovarian. ? Tubal. ? Peritoneal cancers.  Results of the assessment will determine the need for genetic counseling and BRCA1 and BRCA2 testing.  Cervical Cancer Your  health care provider may recommend that you be screened regularly for cancer of the pelvic organs (ovaries, uterus, and vagina). This screening involves a pelvic examination, including checking for microscopic changes to the surface of your cervix (Pap test). You may be encouraged to have this screening done every 3 years, beginning at age 69.  For women ages 53-65, health care providers may recommend pelvic exams and Pap testing every 3 years, or they may recommend the Pap and pelvic exam, combined with testing for human papilloma virus (HPV), every 5 years. Some types of HPV increase your risk of cervical cancer. Testing for HPV may also be done on women of any age with unclear Pap test results.  Other health care providers may not recommend any screening for nonpregnant women who are considered low risk for pelvic cancer and who do not have symptoms. Ask your health care provider if a screening pelvic exam is right for you.  If you have had past treatment for cervical cancer or a condition that could lead to cancer, you need Pap tests and screening for cancer for at least 20 years after your treatment. If Pap tests have been discontinued, your risk factors (such as having a new sexual partner) need to be reassessed to determine if screening should resume. Some women have medical problems that increase the chance of getting cervical cancer. In these cases, your health care provider may recommend more frequent screening and Pap tests.  Colorectal Cancer  This type of cancer can be detected and often prevented.  Routine colorectal cancer screening usually begins at 48 years of age and continues through 48 years of age.  Your health care provider may recommend screening at an earlier age if you have risk factors for colon cancer.  Your health care provider may also recommend using home test kits to check for hidden blood in the stool.  A small camera at the end of a tube can be used to examine your  colon directly (sigmoidoscopy or colonoscopy). This is done to check for the earliest forms of colorectal cancer.  Routine screening usually begins at age 54.  Direct examination of the colon should be repeated every 5-10 years through 48 years of age. However, you may need to be screened more often if early forms of precancerous polyps or small growths are found.  Skin Cancer  Check your skin from head to toe regularly.  Tell your health care provider about any new moles or changes in moles, especially if there is a change in a mole's shape or color.  Also tell your health care provider if you have a mole that is larger than the size of a pencil eraser.  Always use sunscreen. Apply sunscreen liberally and repeatedly throughout the day.  Protect yourself by wearing long sleeves, pants, a wide-brimmed hat, and sunglasses whenever you are outside.  Heart disease, diabetes, and high blood pressure  High blood pressure causes heart disease and increases the risk  of stroke. High blood pressure is more likely to develop in: ? People who have blood pressure in the high end of the normal range (130-139/85-89 mm Hg). ? People who are overweight or obese. ? People who are African American.  If you are 40-8 years of age, have your blood pressure checked every 3-5 years. If you are 63 years of age or older, have your blood pressure checked every year. You should have your blood pressure measured twice-once when you are at a hospital or clinic, and once when you are not at a hospital or clinic. Record the average of the two measurements. To check your blood pressure when you are not at a hospital or clinic, you can use: ? An automated blood pressure machine at a pharmacy. ? A home blood pressure monitor.  If you are between 33 years and 2 years old, ask your health care provider if you should take aspirin to prevent strokes.  Have regular diabetes screenings. This involves taking a blood sample  to check your fasting blood sugar level. ? If you are at a normal weight and have a low risk for diabetes, have this test once every three years after 48 years of age. ? If you are overweight and have a high risk for diabetes, consider being tested at a younger age or more often. Preventing infection Hepatitis B  If you have a higher risk for hepatitis B, you should be screened for this virus. You are considered at high risk for hepatitis B if: ? You were born in a country where hepatitis B is common. Ask your health care provider which countries are considered high risk. ? Your parents were born in a high-risk country, and you have not been immunized against hepatitis B (hepatitis B vaccine). ? You have HIV or AIDS. ? You use needles to inject street drugs. ? You live with someone who has hepatitis B. ? You have had sex with someone who has hepatitis B. ? You get hemodialysis treatment. ? You take certain medicines for conditions, including cancer, organ transplantation, and autoimmune conditions.  Hepatitis C  Blood testing is recommended for: ? Everyone born from [birth year] through 1966. ? Anyone with known risk factors for hepatitis C.  Sexually transmitted infections (STIs)  You should be screened for sexually transmitted infections (STIs) including gonorrhea and chlamydia if: ? You are sexually active and are younger than 48 years of age. ? You are older than 48 years of age and your health care provider tells you that you are at risk for this type of infection. ? Your sexual activity has changed since you were last screened and you are at an increased risk for chlamydia or gonorrhea. Ask your health care provider if you are at risk.  If you do not have HIV, but are at risk, it may be recommended that you take a prescription medicine daily to prevent HIV infection. This is called pre-exposure prophylaxis (PrEP). You are considered at risk if: ? You are sexually active and do not  regularly use condoms or know the HIV status of your partner(s). ? You take drugs by injection. ? You are sexually active with a partner who has HIV.  Talk with your health care provider about whether you are at high risk of being infected with HIV. If you choose to begin PrEP, you should first be tested for HIV. You should then be tested every 3 months for as long as you are taking PrEP. Pregnancy  If you are premenopausal and you may become pregnant, ask your health care provider about preconception counseling.  If you may become pregnant, take 400 to 800 micrograms (mcg) of folic acid every day.  If you want to prevent pregnancy, talk to your health care provider about birth control (contraception). Osteoporosis and menopause  Osteoporosis is a disease in which the bones lose minerals and strength with aging. This can result in serious bone fractures. Your risk for osteoporosis can be identified using a bone density scan.  If you are 37 years of age or older, or if you are at risk for osteoporosis and fractures, ask your health care provider if you should be screened.  Ask your health care provider whether you should take a calcium or vitamin D supplement to lower your risk for osteoporosis.  Menopause may have certain physical symptoms and risks.  Hormone replacement therapy may reduce some of these symptoms and risks. Talk to your health care provider about whether hormone replacement therapy is right for you. Follow these instructions at home:  Schedule regular health, dental, and eye exams.  Stay current with your immunizations.  Do not use any tobacco products including cigarettes, chewing tobacco, or electronic cigarettes.  If you are pregnant, do not drink alcohol.  If you are breastfeeding, limit how much and how often you drink alcohol.  Limit alcohol intake to no more than 1 drink per day for nonpregnant women. One drink equals 12 ounces of beer, 5 ounces of wine, or  1 ounces of hard liquor.  Do not use street drugs.  Do not share needles.  Ask your health care provider for help if you need support or information about quitting drugs.  Tell your health care provider if you often feel depressed.  Tell your health care provider if you have ever been abused or do not feel safe at home. This information is not intended to replace advice given to you by your health care provider. Make sure you discuss any questions you have with your health care provider. Document Released: 03/21/2011 Document Revised: 02/11/2016 Document Reviewed: 06/09/2015 Elsevier Interactive Patient Education  [employer].

## 2018-05-16 NOTE — Assessment & Plan Note (Addendum)
PHQ9 and GAD7 reviewed. Support provided.  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  No med changes recommended today. Discussed return to counseling - will hold off for now.

## 2018-05-16 NOTE — Assessment & Plan Note (Addendum)
Preventative protocols reviewed and updated unless pt declined. Discussed healthy diet and lifestyle.  Encouraged she schedule due health maintenance, namely mammogram. Pap smear today.

## 2018-05-16 NOTE — Progress Notes (Signed)
BP 122/70 (BP Location: Left Arm, Patient Position: Sitting, Cuff Size: Large)   Pulse 92   Temp 97.8 F (36.6 C) (Oral)   Ht 5' 3.5" (1.613 m)   Wt 289 lb 4 oz (131.2 kg)   LMP 04/28/2018   SpO2 99%   BMI 50.43 kg/m    CC: CPE Subjective:    Patient ID: Lisa Curry, female    DOB: [birth year], 47 y.o.   MRN: [id number]  HPI: Lisa Curry is a 47 y.o. female presenting on 05/16/2018 for Annual Exam (Takes clonazepam BID.)   Increased family stress. 52yo grandmother with significant health concerns - she has become her grandmother's caregiver - shares this task with her aunt. Husband not happy about Lisa Curry caring for her grandmother. Mother is getting over shoulder surgery.  Previously saw Dr Lisa Curry but not since she's moved.   More trouble with mood recently - see above. She takes effexor 225mg , wellbutrin 100mg  bid, and klonopin 0.5mg  bid (recently scheduled).   30 lb weight gain in last 6 months.   Possible sinus infection - describes R sided pressure headache ongoing over past month. Increased nasal congestion, nasal mucous. Allegra D has helped, flonase didn't. Notes increasing dizziness.   Sugar log was briefly reviewed today.   Preventative: LMP 04/28/2018, regular monthly Pap Smear: 2012, 2015 (absent transformation zone), 2016 WNL. Rpt today 2019.  Mammogram: 12/2013 normal. Due.  EGD - ?h/o barrett's during EGD at [facility]. Rpt EGD with [facility] 10/2017 - WNL  Flu: yearly Pneumovax 2015 Tetanus: >10 yrs - declines.  Seat belt use discussed  Sunscreen use discussed. No changing moles on skin.  Non smoker  Alcohol - none  Eye exam: every other year.  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 Activity: no regular exercise  Diet: good water, fruits/vegetables daily   Relevant past medical, surgical, family  and social history reviewed and updated as indicated. Interim medical history since our last visit reviewed. Allergies and medications reviewed and updated. Outpatient Medications Prior to Visit  Medication Sig Dispense Refill  . atorvastatin (LIPITOR) 20 MG tablet Take 1 tablet (20 mg total) by mouth daily at 6 PM. 30 tablet 6  . buPROPion (WELLBUTRIN) 100 MG tablet Take 1 tablet (100 mg total) by mouth 2 (two) times daily. 180 tablet 3  . Cholecalciferol (VITAMIN D3) 1000 UNITS CAPS Take 1 capsule (1,000 Units total) by mouth daily. 30 capsule   . clonazePAM (KLONOPIN) 0.5 MG tablet TAKE 1 TABLET BY MOUTH DAILY. MAY TAKE 1 ADDITIONAL TABLET DAILY IF NEEDED 60 tablet 1  . clotrimazole (LOTRIMIN) 1 % cream Apply 1 application topically 2 (two) times daily. 60 g 0  . cyclobenzaprine (FLEXERIL) 10 MG tablet Take 1 tablet (10 mg total) by mouth 2 (two) times daily as needed for muscle spasms (sedation precautions). 30 tablet 1  . dexlansoprazole (DEXILANT) 60 MG capsule TAKE 1 CAPSULE (60 MG TOTAL) BY MOUTH DAILY. 30 capsule 11  . glimepiride (AMARYL) 4 MG tablet Take 1 tablet (4 mg total) by mouth 2 (two) times daily. 60 tablet 9  . glucose blood (ONE TOUCH ULTRA TEST) test strip Use to check sugar twice daily and as needed. Dx: E11.65 300 each 3  . Lancet Devices (ONE TOUCH DELICA LANCING DEV) MISC Use as directed with lancets  and one touch meter E11.65 1 each 0  . Multiple Vitamin (MULTIVITAMIN) capsule Take 1 capsule by mouth daily.    . naproxen sodium (ALEVE) 220 MG tablet Take 220-440 mg by mouth at bedtime as needed.    . nystatin cream (MYCOSTATIN) Apply 1 application topically 2 (two) times daily. 60 g 1  . ONETOUCH DELICA LANCETS 95A MISC Check blood sugar twice daily and as directed. Dx E11.65 200 each 0  . pioglitazone (ACTOS) 30 MG tablet TAKE 1 TABLET BY MOUTH EVERY DAY 30 tablet 1  . POTASSIUM PO Take by mouth daily.    . ranitidine (ZANTAC) 150 MG tablet Take 150 mg by mouth at  bedtime.    . TRADJENTA 5 MG TABS tablet TAKE 1 TABLET BY MOUTH EVERY DAY 30 tablet 0  . Venlafaxine HCl 225 MG TB24 Take 1 tablet (225 mg total) by mouth daily. 90 each 1   Facility-Administered Medications Prior to Visit  Medication Dose Route Frequency Provider Last Rate Last Dose  . diclofenac sodium (VOLTAREN) 1 % transdermal gel 2 g  2 g Topical QID Jessy Oto, MD      . 0.9 %  sodium chloride infusion  500 mL Intravenous Once Milus Banister, MD         Per HPI unless specifically indicated in ROS section below Review of Systems  Constitutional: Negative for activity change, appetite change, chills, fatigue, fever and unexpected weight change.  HENT: Positive for congestion. Negative for hearing loss.   Eyes: Negative for visual disturbance.  Respiratory: Positive for cough (?sinus related). Negative for chest tightness, shortness of breath and wheezing.   Cardiovascular: Negative for chest pain, palpitations and leg swelling.  Gastrointestinal: Negative for abdominal distention, abdominal pain, blood in stool, constipation, diarrhea, nausea and vomiting.  Genitourinary: Negative for difficulty urinating and hematuria.  Musculoskeletal: Negative for arthralgias, myalgias and neck pain.  Skin: Negative for rash.  Neurological: Positive for dizziness and headaches. Negative for seizures and syncope.  Hematological: Negative for adenopathy. Does not bruise/bleed easily.  Psychiatric/Behavioral: Positive for dysphoric mood. The patient is not nervous/anxious.        Increased stress recently       Objective:    BP 122/70 (BP Location: Left Arm, Patient Position: Sitting, Cuff Size: Large)   Pulse 92   Temp 97.8 F (36.6 C) (Oral)   Ht 5' 3.5" (1.613 m)   Wt 289 lb 4 oz (131.2 kg)   LMP 04/28/2018   SpO2 99%   BMI 50.43 kg/m   Wt Readings from Last 3 Encounters:  05/16/18 289 lb 4 oz (131.2 kg)  12/15/17 273 lb (123.8 kg)  11/21/17 260 lb (117.9 kg)    Physical  Exam  Constitutional: She is oriented to person, place, and time. She appears well-developed and well-nourished. No distress.  HENT:  Head: Normocephalic and atraumatic.  Right Ear: Hearing, tympanic membrane, external ear and ear canal normal.  Left Ear: Hearing, tympanic membrane, external ear and ear canal normal.  Nose: Mucosal edema present. No rhinorrhea. Right sinus exhibits frontal sinus tenderness (mild). Right sinus exhibits no maxillary sinus tenderness. Left sinus exhibits no maxillary sinus tenderness and no frontal sinus tenderness.  Mouth/Throat: Uvula is midline, oropharynx is clear and moist and mucous membranes are normal. No oropharyngeal exudate, posterior oropharyngeal edema, posterior oropharyngeal erythema or tonsillar abscesses.  Eyes: Pupils are equal, round, and reactive to light. Conjunctivae and EOM are normal. No scleral icterus.  Neck: Normal range  of motion. Neck supple. No thyromegaly present.  Cardiovascular: Normal rate, regular rhythm, normal heart sounds and intact distal pulses.  No murmur heard. Pulses:      Radial pulses are 2+ on the right side, and 2+ on the left side.  Pulmonary/Chest: Effort normal and breath sounds normal. No respiratory distress. She has no wheezes. She has no rales.  Abdominal: Soft. Bowel sounds are normal. She exhibits no distension and no mass. There is no tenderness. There is no rebound and no guarding.  Genitourinary: Vagina normal and uterus normal. Pelvic exam was performed with patient supine. There is no rash, tenderness, lesion or injury on the right labia. There is no rash, tenderness, lesion or injury on the left labia. Cervix exhibits no motion tenderness. Right adnexum displays no mass and no tenderness. Left adnexum displays no mass and no tenderness.  Genitourinary Comments: Pap performed on cervix, deep and left sided  Musculoskeletal: Normal range of motion. She exhibits no edema.  Lymphadenopathy:    She has no  cervical adenopathy.  Neurological: She is alert and oriented to person, place, and time.  CN grossly intact, station and gait intact  Skin: Skin is warm and dry. No rash noted.  Psychiatric: Her behavior is normal. Judgment and thought content normal. She exhibits a depressed mood.  Tears with discussion of current caregiver stress  Nursing note and vitals reviewed.  Results for orders placed or performed in visit on 12/15/17  HM DIABETES FOOT EXAM  Result Value Ref Range   HM Diabetic Foot Exam [place] - normal   HM DIABETES EYE EXAM  Result Value Ref Range   HM Diabetic Eye Exam No Retinopathy No Retinopathy   Depression screen [facility] 2/9 05/16/2018 04/24/2017 02/27/2014 11/04/2013  Decreased Interest 3 2 1  0  Down, Depressed, Hopeless 1 2 2  0  PHQ - 2 Score 4 4 3  0  Altered sleeping 1 2 0 -  Tired, decreased energy 2 3 3  -  Change in appetite 2 3 0 -  Feeling bad or failure about yourself  1 1 0 -  Trouble concentrating 2 1 0 -  Moving slowly or fidgety/restless 1 1 0 -  Suicidal thoughts 0 0 0 -  PHQ-9 Score 13 15 6  -  Difficult doing work/chores - Somewhat difficult - -    GAD 7 : Generalized Anxiety Score 05/16/2018  Nervous, Anxious, on Edge 2  Control/stop worrying 0  Worry too much - different things 0  Trouble relaxing 0  Restless 0  Easily annoyed or irritable 2  Afraid - awful might happen 0  Total GAD 7 Score 4       Assessment & Plan:   Problem List Items Addressed This Visit    Vitamin D deficiency    Update labs      Relevant Orders   VITAMIN D 25 Hydroxy (Vit-D Deficiency, Fractures)   Morbid obesity with BMI of 50.0-59.9, adult ([place])    Discussed weight gain noted. Encouraged healthy diet and lifestyle changes to affect sustainable weight loss.       MDD (major depressive disorder), recurrent episode, moderate ([place])    PHQ9 and GAD7 reviewed. Support provided.  Hopeful for improvement once her mother's released from ortho care (recent shoulder  surgery) and can help as caregiver again.  No med changes recommended today. Discussed return to counseling - will hold off for now.       Lower back pain    Ongoing, current physical labor  can exacerbate this.      HLD (hyperlipidemia)    Chronic, on statin. Update FLP.       Relevant Orders   Lipid panel   Comprehensive metabolic panel   TSH   Health maintenance examination - Primary    Preventative protocols reviewed and updated unless pt declined. Discussed healthy diet and lifestyle.  Encouraged she schedule due health maintenance, namely mammogram. Pap smear today.       Relevant Orders   Cytology - PAP   Controlled type 2 diabetes mellitus without complication, without long-term current use of insulin ([place])    Anticipate worsening with weight gain noted, however cbg log she brings is overall stable. Update A1c today. Continue tradjenta, actos, and amaryl.       Relevant Orders   Hemoglobin A1c   Microalbumin / creatinine urine ratio   Acute sinusitis    Suspicion for acute sinusitis - will Rx augmentin course.       Relevant Medications   amoxicillin-clavulanate (AUGMENTIN) 875-125 MG tablet       Meds ordered this encounter  Medications  . amoxicillin-clavulanate (AUGMENTIN) 875-125 MG tablet    Sig: Take 1 tablet by mouth 2 (two) times daily for 10 days.    Dispense:  20 tablet    Refill:  0   Orders Placed This Encounter  Procedures  . Lipid panel  . Comprehensive metabolic panel  . TSH  . Hemoglobin A1c  . Microalbumin / creatinine urine ratio  . VITAMIN D 25 Hydroxy (Vit-D Deficiency, Fractures)    Follow up plan: Return in about 4 months (around 09/15/2018) for follow up visit.  Ria Bush, MD

## 2018-05-17 DIAGNOSIS — J019 Acute sinusitis, unspecified: Secondary | ICD-10-CM | POA: Insufficient documentation

## 2018-05-17 LAB — COMPREHENSIVE METABOLIC PANEL
ALT: 16 U/L (ref 0–35)
AST: 19 U/L (ref 0–37)
Albumin: 4.1 g/dL (ref 3.5–5.2)
Alkaline Phosphatase: 83 U/L (ref 39–117)
BUN: 7 mg/dL (ref 6–23)
CO2: 30 mEq/L (ref 19–32)
Calcium: 9.1 mg/dL (ref 8.4–10.5)
Chloride: 101 mEq/L (ref 96–112)
Creatinine, Ser: 0.79 mg/dL (ref 0.40–1.20)
GFR: 82.53 mL/min (ref >60.00)
Glucose, Bld: 89 mg/dL (ref 70–99)
Potassium: 3.4 mEq/L — ABNORMAL LOW (ref 3.5–5.1)
Sodium: 140 mEq/L (ref 135–145)
Total Bilirubin: 0.5 mg/dL (ref 0.2–1.2)
Total Protein: 7.1 g/dL (ref 6.0–8.3)

## 2018-05-17 LAB — LIPID PANEL
Cholesterol: 127 mg/dL (ref 0–200)
HDL: 50.1 mg/dL (ref >39.00)
LDL Cholesterol: 52 mg/dL (ref 0–99)
NonHDL: 76.7
Total CHOL/HDL Ratio: 3
Triglycerides: 124 mg/dL (ref 0.0–149.0)
VLDL: 24.8 mg/dL (ref 0.0–40.0)

## 2018-05-17 LAB — MICROALBUMIN / CREATININE URINE RATIO
Creatinine,U: 56.8 mg/dL
Microalb Creat Ratio: 1.2 mg/g (ref 0.0–30.0)
Microalb, Ur: <0.7 mg/dL (ref 0.0–1.9)

## 2018-05-17 LAB — TSH: TSH: 2.42 u[IU]/mL (ref 0.35–4.50)

## 2018-05-17 LAB — HEMOGLOBIN A1C: Hgb A1c MFr Bld: 6.4 % (ref 4.6–6.5)

## 2018-05-17 LAB — VITAMIN D 25 HYDROXY (VIT D DEFICIENCY, FRACTURES): VITD: 36.49 ng/mL (ref 30.00–100.00)

## 2018-05-17 MED ORDER — AMOXICILLIN-POT CLAVULANATE 875-125 MG PO TABS: 1.0000 | ORAL_TABLET | Freq: Two times a day (BID) | ORAL | 0 refills | Status: AC

## 2018-05-17 NOTE — Assessment & Plan Note (Signed)
Anticipate worsening with weight gain noted, however cbg log she brings is overall stable. Update A1c today. Continue tradjenta, actos, and amaryl.

## 2018-05-17 NOTE — Assessment & Plan Note (Signed)
Update labs.  

## 2018-05-17 NOTE — Assessment & Plan Note (Signed)
Ongoing, current physical labor can exacerbate this.

## 2018-05-17 NOTE — Assessment & Plan Note (Signed)
Suspicion for acute sinusitis - will Rx augmentin course.

## 2018-05-17 NOTE — Assessment & Plan Note (Signed)
Discussed weight gain noted. Encouraged healthy diet and lifestyle changes to affect sustainable weight loss.  

## 2018-05-17 NOTE — Assessment & Plan Note (Addendum)
Chronic, on statin. Update FLP.

## 2018-05-18 LAB — CYTOLOGY - PAP
Adequacy: ABSENT
Diagnosis: NEGATIVE
HPV: NOT DETECTED

## 2018-05-22 ENCOUNTER — Telehealth: Payer: Self-pay | Admitting: Family Medicine

## 2018-05-22 MED ORDER — FLUCONAZOLE 150 MG PO TABS: 150.0000 mg | ORAL_TABLET | Freq: Once | ORAL | 0 refills | Status: DC

## 2018-05-22 MED ORDER — FLUCONAZOLE 150 MG PO TABS: 150.0000 mg | ORAL_TABLET | Freq: Once | ORAL | 0 refills | Status: AC

## 2018-05-22 NOTE — Telephone Encounter (Signed)
plz notify diflucan sent to pharmacy. Take 1 tablet towards end of abx course. May take 2nd dose 4d later if needed. plz check on preferred pharmacy and send in to correct one if needed. I sent to [facility].

## 2018-05-22 NOTE — Addendum Note (Signed)
Addended by: Brenton Grills on: 03/26/9395 88:64 PM   Modules accepted: Orders

## 2018-05-22 NOTE — Telephone Encounter (Signed)
Copied from [place] [id number]. Topic: Quick Communication - See Telephone Encounter >> May 22, 2018 12:46 PM Percell Belt A wrote: CRM for notification. See Telephone encounter for: 05/22/18.  Pt called in and stated that she is on antibiotic for sinus infection and now she has a yeast infection.  She stated taking the Antibiotic Thurs last week.  She would like to know if Dr Darnell Level could call in meds for her  [place] on [address]  Call back number -[phone]

## 2018-05-22 NOTE — Telephone Encounter (Signed)
Spoke with pt relaying message and instructions per Dr. Darnell Level. States she is having really bad vaginal burning. Pt states her last day on abx is Sun, 05/27/18. She is asking what day to take the diflucan.  Also, pt is asking is there another abx she can take. Pt also mentioned [facility] [[facility]  Per Dr. Darnell Level, pt can go ahead and take 1st dose now and that 2nd dose in 4 days if needed. Dr. Darnell Level does not recommend another abx at this time. State pt may use OTC Monistat cream to help soothe the area.   Spoke with pt relaying this latest message per Dr. Darnell Level.  Pt verbalizes understanding.

## 2018-05-23 ENCOUNTER — Other Ambulatory Visit: Payer: Self-pay

## 2018-05-23 ENCOUNTER — Other Ambulatory Visit: Payer: Self-pay | Admitting: Family Medicine

## 2018-05-23 ENCOUNTER — Encounter: Payer: BLUE CROSS/BLUE SHIELD | Admitting: Family Medicine

## 2018-05-25 ENCOUNTER — Encounter: Payer: Self-pay | Admitting: Family Medicine

## 2018-05-25 ENCOUNTER — Ambulatory Visit: Payer: BLUE CROSS/BLUE SHIELD | Admitting: Family Medicine

## 2018-05-25 ENCOUNTER — Ambulatory Visit: Payer: Self-pay | Admitting: *Deleted

## 2018-05-25 VITALS — BP 120/74 | HR 98 | Temp 98.8°F | Resp 15 | Ht 63.5 in | Wt 282.6 lb

## 2018-05-25 DIAGNOSIS — M5442 Lumbago with sciatica, left side: Secondary | ICD-10-CM

## 2018-05-25 DIAGNOSIS — M7918 Myalgia, other site: Secondary | ICD-10-CM | POA: Diagnosis not present

## 2018-05-25 MED ORDER — ACETAMINOPHEN-CODEINE #3 300-30 MG PO TABS: 1.0000 | ORAL_TABLET | Freq: Four times a day (QID) | ORAL | 0 refills | Status: AC | PRN

## 2018-05-25 MED ORDER — KETOROLAC TROMETHAMINE 60 MG/2ML IM SOLN
60.0000 mg | Freq: Once | INTRAMUSCULAR | Status: AC
  Administered 2018-05-25: 60 mg via INTRAMUSCULAR

## 2018-05-25 MED ORDER — METHYLPREDNISOLONE 4 MG PO TBPK: ORAL_TABLET | ORAL | 0 refills | Status: DC

## 2018-05-25 MED ORDER — METHYLPREDNISOLONE ACETATE 40 MG/ML IJ SUSP
40.0000 mg | Freq: Once | INTRAMUSCULAR | Status: AC
  Administered 2018-05-25: 40 mg via INTRAMUSCULAR

## 2018-05-25 NOTE — Telephone Encounter (Signed)
Pt called with complaints of having "back tenderness" this morning; she says that she reached over and she felt like she was having a "sudden massive cramp";the pt says that she took 2.5 mg flexeril and 0630, and 5 mg at 1300 along with 800 mg of ibuprofen; she says that she also has ben applying ice, and sitting in a recliner with little relief; recommendations per to include seeing a physician within 4 hours; the pt normally sees Dr Danise [name], [place], but there is no availability for this provider or in the office today;  pt offered and accepted appointment with Dr Terese Door, [facility] today at 1515; she verbalizes understanding; also the pt would like to know if Dr Danise [name] can call her something in since she was just seen in the office on 05/16/18; spoke with Rena and per Dr Danise [name] the pt should be seen because it is unclear what caused the pain; pt notified of Dr Gutierrez's reply; she again verbalizes understanding; also spoke with [place], [place], regarding this appointment. Reason for Disposition . [1] SEVERE back pain (e.g., excruciating, unable to do any normal activities) AND [2] not improved 2 hours after pain medicine  Answer Assessment - Initial Assessment Questions 1. ONSET: "When did the pain begin?"      05/25/18 0530 2. LOCATION: "Where does it hurt?" (upper, mid or lower back)     Lower back primarily left and center 3. SEVERITY: "How bad is the pain?"  (e.g., Scale 1-10; mild, moderate, or severe)   - MILD (1-3): doesn't interfere with normal activities    - MODERATE (4-7): interferes with normal activities or awakens from sleep    - SEVERE (8-10): excruciating pain, unable to do any normal activities      Servere; 10 over 10; hurts for the pt to walk; she can't stand up straight 4. PATTERN: "Is the pain constant?" (e.g., yes, no; constant, intermittent)      constant 5. RADIATION: "Does the pain shoot into your legs or elsewhere?"     no 6. CAUSE:   "What do you think is causing the back pain?"      Not sure 7. BACK OVERUSE:  "Any recent lifting of heavy objects, strenuous work or exercise?"     no 8. MEDICATIONS: "What have you taken so far for the pain?" (e.g., nothing, acetaminophen, NSAIDS)     Muscle relaxer 5 mg flexeril and 800 mg ibuprofen at 1300; also ice to the area and has been sitting in recliner off her feet 9. NEUROLOGIC SYMPTOMS: "Do you have any weakness, numbness, or problems with bowel/bladder control?"     no 10. OTHER SYMPTOMS: "Do you have any other symptoms?" (e.g., fever, abdominal pain, burning with urination, blood in urine)       no 11. PREGNANCY: "Is there any chance you are pregnant?" (e.g., yes, no; LMP)       No  LMP 04/28/18  Protocols used: BACK PAIN-A-AH

## 2018-05-25 NOTE — Progress Notes (Signed)
Subjective:    Patient ID: Lisa Curry, female    DOB: [birth year], 47 y.o.   MRN: [id number]  HPI   Patient presents to clinic complaining of left-sided low back pain.  States pain started this morning -noticed it when she went to change position.  Denies any known injury to back, but notes she easily could have twisted wrong and tweaked her back.  Patient does have a history of back pain flareups off and on.  She has taken one Aleve with minimal effect controlling pain.  Denies any saddle anesthesia, denies any loss of bowel or bladder control.  States pain does travel down left leg and off-and-on she does feel some tingling.  Patient Active Problem List   Diagnosis Date Noted  . Acute sinusitis 05/17/2018  . Pain of left heel 06/23/2017  . Right shoulder pain 06/23/2017  . Anemia 09/01/2016  . Umbilical hernia 02/63/7858  . Lower back pain 08/16/2016  . Intertrigo 06/04/2016  . Sinus congestion 06/04/2016  . Vitamin D deficiency   . HLD (hyperlipidemia)   . Health maintenance examination 01/20/2015  . Morbid obesity with BMI of 50.0-59.9, adult ([place]) 11/04/2013  . GERD (gastroesophageal reflux disease) 11/04/2013  . Controlled type 2 diabetes mellitus without complication, without long-term current use of insulin ([place]) 11/04/2013  . MDD (major depressive disorder), recurrent episode, moderate ([place]) 11/04/2013   Social History   Tobacco Use  . Smoking status: Never Smoker  . Smokeless tobacco: Never Used  Substance Use Topics  . Alcohol use: Yes    Alcohol/week: 0.0 standard drinks    Comment: occasional   Review of Systems   Constitutional: Negative for chills, fatigue and fever.  HENT: Negative for congestion, ear pain, sinus pain and sore throat.   Eyes: Negative.   Respiratory: Negative for cough, shortness of breath and wheezing.   Cardiovascular: Negative for chest pain, palpitations and leg swelling.  Gastrointestinal: Negative for abdominal pain, diarrhea,  nausea and vomiting.  Genitourinary: Negative for dysuria, frequency and urgency.  Musculoskeletal: left sided low back pain  Skin: Negative for color change, pallor and rash.  Neurological: Negative for syncope, light-headedness and headaches.  Psychiatric/Behavioral: The patient is not nervous/anxious.       Objective:   Physical Exam  Constitutional: She is oriented to person, place, and time. No distress.  HENT:  Head: Normocephalic and atraumatic.  Eyes: No scleral icterus.  Cardiovascular: Normal rate and regular rhythm.  Pulmonary/Chest: Effort normal. No respiratory distress.  Musculoskeletal: She exhibits tenderness.  +tenderness left low back into buttock area, pain mainly in left paraspinal muscles, not as much along lumbar spine. Able to cause pulling pain when left leg raised up.   Neurological: She is alert and oriented to person, place, and time. No cranial nerve deficit or sensory deficit.  Skin: Skin is warm and dry. She is not diaphoretic. No pallor.  Psychiatric: She has a normal mood and affect. Her behavior is normal.  Nursing note and vitals reviewed.     Vitals:   05/25/18 1539  BP: 120/74  Pulse: 98  Resp: 15  Temp: 98.8 F (37.1 C)  SpO2: 97%   Assessment & Plan:    Low back pain, acute --patient will get IM injection of Depo-Medrol and Toradol in clinic.  Prescription for prednisone taper sent to pharmacy.  Patient advised she can use 1 to 2 tablets of Aleve twice daily at home to help pain control as well.  Patient given 10 tablets  supply of Tylenol No. 3 to use as needed for severe pain, patient advised this medication can cause drowsiness, so noticed not to be taken prior to driving or operating heavy machinery.  Range of motion stretching exercises to help low back pain improved discussed.  Administrations This Visit    ketorolac (TORADOL) injection 60 mg    Admin Date 05/25/2018 Action Given Dose 60 mg Route Intramuscular Administered  By Adair Laundry, CMA       methylPREDNISolone acetate (DEPO-MEDROL) injection 40 mg    Admin Date 05/25/2018 Action Given Dose 40 mg Route Intramuscular Administered By Adair Laundry, CMA           Keep regular follow-up as planned with PCP. Return to clinic sooner if symptoms persist or worsen.

## 2018-05-25 NOTE — Patient Instructions (Addendum)
You can take 2 aleve 2 times per day for acute pain episodes. Topical biofreeze and or bengay is also a nice OTC option.  Great to meet you!

## 2018-05-25 NOTE — Telephone Encounter (Signed)
Katrese RN called;this morning pts back was tender and then felt like massive cramp in lower lt to the center of pts back. Pt took Ibuprofen 800 mg and Flexeril 5 mg with no real relief. Pt using ice and sitting in recliner which seems to help some. Pt has appt at [facility] 05/25/18 at 3:15 but pt wants to know since recently see for annual could med be called in. No known injury or lifting. Dr Darnell Level said since not sure of cause pt should be seen for eval. Iona Coach RN will let pt know.

## 2018-06-03 ENCOUNTER — Other Ambulatory Visit: Payer: Self-pay | Admitting: Family Medicine

## 2018-06-04 ENCOUNTER — Encounter: Payer: Self-pay | Admitting: Family Medicine

## 2018-06-04 NOTE — Telephone Encounter (Signed)
Name of Medication: Clonazepam [facility] of [facility] or Written Date and Quantity: 05/05/18, #60 Last Office Visit and Type: 05/25/18, acute Next Office Visit and Type: 09/10/18, f/u Last Controlled Substance Agreement Date: 04/24/17 Last UDS: 10/16/14

## 2018-06-04 NOTE — Telephone Encounter (Signed)
Eprescribed.

## 2018-06-08 MED ORDER — CYCLOBENZAPRINE HCL 10 MG PO TABS: 10.0000 mg | ORAL_TABLET | Freq: Two times a day (BID) | ORAL | 1 refills | Status: DC | PRN

## 2018-06-08 MED ORDER — ACETAMINOPHEN-CODEINE #3 300-30 MG PO TABS: 1.0000 | ORAL_TABLET | Freq: Three times a day (TID) | ORAL | 0 refills | Status: DC | PRN

## 2018-06-08 NOTE — Telephone Encounter (Signed)
[place] CSRS reviewed  ?

## 2018-06-13 ENCOUNTER — Ambulatory Visit: Payer: BLUE CROSS/BLUE SHIELD | Admitting: Neurology

## 2018-06-13 ENCOUNTER — Encounter: Payer: Self-pay | Admitting: Neurology

## 2018-06-13 VITALS — BP 129/70 | HR 91 | Ht 64.0 in | Wt 285.0 lb

## 2018-06-13 DIAGNOSIS — M2142 Flat foot [pes planus] (acquired), left foot: Secondary | ICD-10-CM

## 2018-06-13 DIAGNOSIS — R208 Other disturbances of skin sensation: Secondary | ICD-10-CM

## 2018-06-13 DIAGNOSIS — M5416 Radiculopathy, lumbar region: Secondary | ICD-10-CM | POA: Diagnosis not present

## 2018-06-13 DIAGNOSIS — R200 Anesthesia of skin: Secondary | ICD-10-CM

## 2018-06-13 DIAGNOSIS — R29898 Other symptoms and signs involving the musculoskeletal system: Secondary | ICD-10-CM

## 2018-06-13 NOTE — Progress Notes (Signed)
[facility]    Provider:  Dr Jaynee Eagles Referring Provider: Basil Dess, MD Primary Care Physician:  Ria Bush, MD  CC:  Leg weakness  HPI:  Lisa Curry is a 47 y.o. female here as requested by Dr. Danise [name] for left leg weakness. She has a history of lumbar spine surgery, unsure when the left leg weakness started maybe about 5 years ago when she fell through porch and hurt knee. Recently symptoms worsening. Having back issues recently 3 weeks ago, the left foot rolls even in stable shoes. Weakness in more of the foot with numbness and pain even water hitting the foot it hurts, right is not affected. Sensory changes on the top of the foot. Her back issues, pain shoots into the both hips at different times, difficulty walking due to pain, worse with long periods of sitting or standing. She can't answer if she has claudication bc she tries not to walk. Sleeping in a recliner helps. She has been Dr. Louanne Skye and been under his care fo rover 6 months conservative treatment heat, nsaids, ice, sstretching, prednisone, flexeril and still not improving. No changes in bowel or bladder.   Reviewed notes, labs and imaging from outside physicians, which showed:   Reviewed images xr lumbar and agree with the following:  AP and lateral flexion and extension radiographs show DDD with disc  narrowing worse at L4-5 and L5-S1 , mild at L2-3, moderated at L1-2 and L3-4. No listhesis.   CMP BUN 7, creatinine .79 04/2018  Review of Systems: Patient complains of symptoms per HPI as well as the following symptoms: leg pain, numbness, weakness. Pertinent negatives and positives per HPI. All others negative.   Social History   Socioeconomic History  . Marital status: Married    Spouse name: Not on file  . Number of children: 2  . Years of education: Not on file  . Highest education level: Bachelor's degree (e.g., BA, AB, BS)  Occupational History  . Occupation: Pharmacist, hospital  Social Needs  .  Financial resource strain: Not on file  . Food insecurity:    Worry: Not on file    Inability: Not on file  . Transportation needs:    Medical: Not on file    Non-medical: Not on file  Tobacco Use  . Smoking status: Never Smoker  . Smokeless tobacco: Never Used  Substance and Sexual Activity  . Alcohol use: Yes    Alcohol/week: 0.0 standard drinks    Comment: occasional  . Drug use: No  . Sexual activity: Not on file  Lifestyle  . Physical activity:    Days per week: Not on file    Minutes per session: Not on file  . Stress: Not on file  Relationships  . Social connections:    Talks on phone: Not on file    Gets together: Not on file    Attends religious service: Not on file    Active member of club or organization: Not on file    Attends meetings of clubs or organizations: Not on file    Relationship status: Not on file  . Intimate partner violence:    Fear of current or ex partner: Not on file    Emotionally abused: Not on file    Physically abused: Not on file    Forced sexual activity: Not on file  Other Topics Concern  . Not on file  Social History Narrative   Lives with husband and 2 children, 2 dogs, 2 cats and  4 ducks and 16 chickens and a fish   Occ: special ed substitute teacher.   Activity: no regular exercise   Diet: good water, fruits/vegetables daily   Right handed   Caffeine: daily    Family History  Problem Relation Age of Onset  . Breast cancer Maternal Grandmother 8  . Dementia Maternal Grandmother   . Heart disease Paternal Grandfather   . Other Sister        Postural Orthostatic Tachycardia Syndrome  . Supraventricular tachycardia Sister   . Other Maternal Aunt        hysterectomy for irregular bleed with abnormal pap  . Hyperlipidemia Mother   . Alzheimer's disease Maternal Grandfather     Past Medical History:  Diagnosis Date  . Allergy   . Anxiety   . Barrett esophagus    Don Bulla, have not received EGD report  . Depression  with anxiety   . Diabetes mellitus without complication ([place])    DSME 04/2014  . Gastric polyp 05/2012   h/o EGD  . GERD (gastroesophageal reflux disease)   . History of Helicobacter pylori infection   . HLD (hyperlipidemia)   . Hyperlipidemia   . Vitamin D deficiency     Past Surgical History:  Procedure Laterality Date  . CESAREAN SECTION  6/95, 4/98  . DILATION AND CURETTAGE OF UTERUS  2011  . ESOPHAGOGASTRODUODENOSCOPY  2012?   [facility] at [facility]  . ESOPHAGOGASTRODUODENOSCOPY  10/2017   [facility] Ardis Hughs)  . LUMBAR DISC SURGERY  03/1995  . NASAL SINUS SURGERY  03/2001    Current Outpatient Medications  Medication Sig Dispense Refill  . acetaminophen-codeine (TYLENOL #3) 300-30 MG tablet Take 1 tablet by mouth 3 (three) times daily as needed for moderate pain. 15 tablet 0  . ALOE VERA JUICE PO Take 3 oz by mouth daily.    Marland Kitchen atorvastatin (LIPITOR) 20 MG tablet Take 1 tablet (20 mg total) by mouth daily at 6 PM. 30 tablet 6  . buPROPion (WELLBUTRIN) 100 MG tablet Take 1 tablet (100 mg total) by mouth 2 (two) times daily. 180 tablet 3  . Cholecalciferol (VITAMIN D3) 1000 UNITS CAPS Take 1 capsule (1,000 Units total) by mouth daily. 30 capsule   . clonazePAM (KLONOPIN) 0.5 MG tablet TAKE 1 TABLET BY MOUTH DAILY. MAY TAKE 1 ADDITIONAL TABLET DAILY IF NEEDED 60 tablet 1  . cyclobenzaprine (FLEXERIL) 10 MG tablet Take 1 tablet (10 mg total) by mouth 2 (two) times daily as needed for muscle spasms (sedation precautions). 30 tablet 1  . dexlansoprazole (DEXILANT) 60 MG capsule TAKE 1 CAPSULE (60 MG TOTAL) BY MOUTH DAILY. 30 capsule 11  . glimepiride (AMARYL) 4 MG tablet Take 1 tablet (4 mg total) by mouth 2 (two) times daily. 60 tablet 9  . glucose blood (ONE TOUCH ULTRA TEST) test strip Use to check sugar twice daily and as needed. Dx: E11.65 300 each 3  . ibuprofen (ADVIL,MOTRIN) 200 MG tablet Take 400-800 mg by mouth. 1-2 times daily    . Lancet Devices (ONE TOUCH DELICA LANCING  DEV) MISC Use as directed with lancets and one touch meter E11.65 1 each 0  . Multiple Vitamin (MULTIVITAMIN) capsule Take 1 capsule by mouth daily.    Marland Kitchen nystatin cream (MYCOSTATIN) Apply 1 application topically 2 (two) times daily. 60 g 1  . ONETOUCH DELICA LANCETS 78H MISC Check blood sugar twice daily and as directed. Dx E11.65 200 each 0  . pioglitazone (ACTOS) 30 MG tablet TAKE  1 TABLET BY MOUTH EVERY DAY 30 tablet 1  . POTASSIUM PO Take by mouth daily.    . ranitidine (ZANTAC) 150 MG tablet Take 150 mg by mouth at bedtime.    . TRADJENTA 5 MG TABS tablet TAKE 1 TABLET BY MOUTH EVERY DAY 30 tablet 3  . Venlafaxine HCl 225 MG TB24 Take 1 tablet (225 mg total) by mouth daily. 90 each 1  . naproxen sodium (ALEVE) 220 MG tablet Take 220-440 mg by mouth at bedtime as needed.     Current Facility-Administered Medications  Medication Dose Route Frequency Provider Last Rate Last Dose  . diclofenac sodium (VOLTAREN) 1 % transdermal gel 2 g  2 g Topical QID Jessy Oto, MD        Allergies as of 06/13/2018 - Review Complete 06/13/2018  Allergen Reaction Noted  . Biaxin [clarithromycin] Hives 11/04/2013  . Metformin and related Diarrhea 03/28/2014    Vitals: BP 129/70 (BP Location: Right Arm, Patient Position: Sitting)   Pulse 91   Ht 5\' 4"  (1.626 m)   Wt 285 lb (129.3 kg)   BMI 48.92 kg/m  Last Weight:  Wt Readings from Last 1 Encounters:  06/13/18 285 lb (129.3 kg)   Last Height:   Ht Readings from Last 1 Encounters:  06/13/18 5\' 4"  (1.626 m)   Physical exam: Exam: Gen: NAD, conversant, well nourised, morbidly obese, well groomed                     CV: RRR, no MRG. No Carotid Bruits. No peripheral edema, warm, nontender Eyes: Conjunctivae clear without exudates or hemorrhage  Neuro: Detailed Neurologic Exam  Speech:    Speech is normal; fluent and spontaneous with normal comprehension.  Cognition:    The patient is oriented to person, place, and time;     recent and  remote memory intact;     language fluent;     normal attention, concentration,     fund of knowledge Cranial Nerves:    The pupils are equal, round, and reactive to light. The fundi are normal and spontaneous venous pulsations are present. Visual fields are full to finger confrontation. Extraocular movements are intact. Trigeminal sensation is intact and the muscles of mastication are normal. The face is symmetric. The palate elevates in the midline. Hearing intact. Voice is normal. Shoulder shrug is normal. The tongue has normal motion without fasciculations.   Coordination:    Normal finger to nose and heel to shin. Normal rapid alternating movements.   Gait:    Heel-toe intact, antalgic  Motor Observation:    No asymmetry, no atrophy, and no involuntary movements noted. Tone:    Normal muscle tone.    Posture:    Posture is normal. normal erect    Strength:    Strength is V/V in the upper and lower limbs.      Sensation: decreased pin prick in a left peroneal nerve distribution.      Reflex Exam:  DTR's:  absent right  AJ otherwise deep tendon reflexes in the upper and lower extremities are normal bilaterally.   Toes:    The toes are down equiv bilaterally.   Clonus:    Clonus is absent.  Assessment/Plan:  47 year old with acute on chronic left leg weakness, numbness, radicular symptoms. > 6 months conservative treatment under the care of Dr. Louanne Skye, symptoms worsening.  Emg/ncs left leg (may also add right hand due to some numbness in the radial  nerve distribution) MRI lumbar spine   Orders Placed This Encounter  Procedures  . MR LUMBAR SPINE WO CONTRAST  . NCV with EMG(electromyography)    Sarina Ill, MD  [facility] [address], [place] [address]  Phone [phone] Fax [phone]

## 2018-06-13 NOTE — Patient Instructions (Addendum)
EMG/NCS MRI Lumbar Spine  Electromyoneurogram Electromyoneurogram is a test to check how well your muscles and nerves are working. This procedure includes the combined use of electromyogram (EMG) and nerve conduction study (NCS). EMG is used to look for muscular disorders. NCS, which is also called electroneurogram, measures how well your nerves are controlling your muscles. The procedures are usually performed together to check if your muscles and nerves are healthy. If the reaction to testing is abnormal, this can indicate disease or injury, such as peripheral nerve damage. Tell a health care provider about:  Any allergies you have.  All medicines you are taking, including vitamins, herbs, eye drops, creams, and over-the-counter medicines.  Any problems you or family members have had with anesthetic medicines.  Any blood disorders you have.  Any surgeries you have had.  Any medical conditions you have.  Any pacemaker you have. What are the risks? Generally, this is a safe procedure. However, problems may occur, including:  Infection where the electrodes were inserted.  Bleeding.  What happens before the procedure?  Ask your health care provider about: ? Changing or stopping your regular medicines. This is especially important if you are taking diabetes medicines or blood thinners. ? Taking medicines such as aspirin and ibuprofen. These medicines can thin your blood. Do not take these medicines before your procedure if your health care provider instructs you not to.  Your health care provider may ask you to avoid: ? Caffeine, such as coffee and tea. ? Nicotine. This includes cigarettes and anything with tobacco.  Do not use lotions or creams on the same day that you will be having the procedure. What happens during the procedure? For EMG:  Your health care provider will ask you to stay in a position so that he or she can access the muscle that will be studied. You may be  standing, sitting down, or lying down.  You may be given a medicine that numbs the area (local anesthetic).  A very thin needle that has an electrode on it will be inserted into your muscle.  Another small electrode will be placed on your skin near the muscle.  Your health care provider will ask you to continue to remain still.  The electrodes will send a signal that tells about the electrical activity of your muscles. You may see this on a monitor or hear it in the room.  After your muscles have been studied at rest, your health care provider will ask you to contract or flex your muscles. The electrodes will send a signal that tells about the electrical activity of your muscles.  Your health care provider will remove the electrodes and the electrode needles when the procedure is finished. The procedure may vary among health care providers and hospitals. For NCS:  An electrode that records your nerve activity (recording electrode) will be placed on your skin by the muscle that is being studied.  An electrode that is used as a reference (reference electrode) will be placed near the recording electrode.  A paste or gel will be applied to your skin between the recording electrode and the reference electrode.  Your nerve will be stimulated with a mild shock. Your health care provider will measure how much time it takes for your muscle to react.  Your health care provider will remove the electrodes and the gel when the procedure is finished. The procedure may vary among health care providers and hospitals. What happens after the procedure?  It is your  responsibility to obtain your test results. Ask your health care provider or the department performing the test when and how you will get your results.  Your health care provider may: ? Give you medicines for any pain. ? Monitor the insertion sites to make sure that they stop bleeding. This information is not intended to replace advice  given to you by your health care provider. Make sure you discuss any questions you have with your health care provider. Document Released: 01/06/2005 Document Revised: 02/11/2016 Document Reviewed: 10/27/2014 Elsevier Interactive Patient Education  2018 [employer].

## 2018-06-14 ENCOUNTER — Telehealth: Payer: Self-pay | Admitting: Radiology

## 2018-06-14 ENCOUNTER — Other Ambulatory Visit: Payer: Self-pay | Admitting: Family Medicine

## 2018-06-14 ENCOUNTER — Telehealth: Payer: Self-pay | Admitting: Neurology

## 2018-06-14 NOTE — Telephone Encounter (Signed)
Pt returned Emily's call °

## 2018-06-14 NOTE — Telephone Encounter (Signed)
Spoke to patient she is scheduled for 06/20/18 at [facility].

## 2018-06-14 NOTE — Telephone Encounter (Signed)
spoke to pt she is in class she wcb  BCBS Auth: [id number] (exp. 06/14/18 to 07/13/18)

## 2018-06-14 NOTE — Telephone Encounter (Signed)
Spoke to patient she is scheduled for 06/20/18 at [facility].

## 2018-06-18 ENCOUNTER — Telehealth: Payer: Self-pay | Admitting: Family Medicine

## 2018-06-18 NOTE — Telephone Encounter (Signed)
Call to pharmacy- patient is able to get 90 day supply- they are going to fill that for her- she uses [facility]. Patient notified.

## 2018-06-18 NOTE — Telephone Encounter (Signed)
Copied from [place] [phone]. Topic: Quick Communication - Rx Refill/Question >> Jun 18, 2018  3:53 PM Waldemar Dickens, Sade R wrote: Medication: buPROPion (WELLBUTRIN) 100 MG tablet  Has the patient contacted their pharmacy? Yes  Preferred Pharmacy (with phone number or street name): [id number] [address], [place]. [phone] (Phone) [phone] (Fax)    Agent: Please be advised that RX refills may take up to 3 business days. We ask that you follow-up with your pharmacy.

## 2018-06-19 ENCOUNTER — Other Ambulatory Visit: Payer: Self-pay | Admitting: Family Medicine

## 2018-06-20 ENCOUNTER — Ambulatory Visit: Payer: BLUE CROSS/BLUE SHIELD

## 2018-06-20 DIAGNOSIS — R208 Other disturbances of skin sensation: Secondary | ICD-10-CM | POA: Diagnosis not present

## 2018-06-20 DIAGNOSIS — R29898 Other symptoms and signs involving the musculoskeletal system: Secondary | ICD-10-CM | POA: Diagnosis not present

## 2018-06-20 DIAGNOSIS — M5416 Radiculopathy, lumbar region: Secondary | ICD-10-CM | POA: Diagnosis not present

## 2018-06-20 DIAGNOSIS — R200 Anesthesia of skin: Secondary | ICD-10-CM

## 2018-07-03 ENCOUNTER — Telehealth: Payer: Self-pay | Admitting: Family Medicine

## 2018-07-03 NOTE — Telephone Encounter (Signed)
Spoke with pt to discuss Actos.  Says she has lost med and has not taken for 1 week. Fasting BS this morning was 108 and yesterday was 131.  She prefers not to have to take Actos if Dr. Darnell Level is ok with these numbers.  If not, her insurance is requesting 90-day rx sent to [facility]. Pt gives permission to lvm but would rather speak to Dr. Darnell Level directly.

## 2018-07-03 NOTE — Telephone Encounter (Signed)
Copied from [place] [id number]. Topic: General - Other >> Jul 03, 2018 12:03 PM Lennox Solders wrote: Reason for CRM: pt does not want an appt she would like to discuss discontinuing one of her medication actos. Pt would like callback. Pt missed place her medication

## 2018-07-04 MED ORDER — PIOGLITAZONE HCL 30 MG PO TABS: 30.0000 mg | ORAL_TABLET | Freq: Every day | ORAL | 1 refills | Status: DC

## 2018-07-04 NOTE — Telephone Encounter (Signed)
Spoke with pt relaying Dr. G's message and instructions.  Pt verbalizes understanding. 

## 2018-07-04 NOTE — Telephone Encounter (Signed)
Actos is a long acting medication so we will likely not see effect of stopping until several weeks from now. I do recommend we continue this med - [facility]

## 2018-07-05 ENCOUNTER — Ambulatory Visit (INDEPENDENT_AMBULATORY_CARE_PROVIDER_SITE_OTHER): Payer: BLUE CROSS/BLUE SHIELD | Admitting: Neurology

## 2018-07-05 ENCOUNTER — Ambulatory Visit: Payer: BLUE CROSS/BLUE SHIELD | Admitting: Neurology

## 2018-07-05 ENCOUNTER — Encounter: Payer: BLUE CROSS/BLUE SHIELD | Admitting: Neurology

## 2018-07-05 DIAGNOSIS — M5417 Radiculopathy, lumbosacral region: Secondary | ICD-10-CM

## 2018-07-05 DIAGNOSIS — M5416 Radiculopathy, lumbar region: Secondary | ICD-10-CM

## 2018-07-05 DIAGNOSIS — Z0289 Encounter for other administrative examinations: Secondary | ICD-10-CM

## 2018-07-05 MED ORDER — GABAPENTIN 300 MG PO CAPS: 300.0000 mg | ORAL_CAPSULE | Freq: Three times a day (TID) | ORAL | 3 refills | Status: DC

## 2018-07-05 NOTE — Procedures (Signed)
Full Name: Lisa Curry Gender: Female MRN #: [id number] Date of Birth: 1971-01-23    Visit Date: 07/05/2018 08:21 Age: 47 Years 2 Months Old Examining Physician: Sarina Ill, MD  Referring Physician: Louanne Skye, MD  History: 47 year old with acute on chronic left leg weakness, numbness, radicular symptoms. Also right hand dorsal numbness.  Summary: EMG/NCS was performed on the right upper and left lower extremities.  All nerves were within normal limits (as detailed in the tables below).  Needle evaluation of the left medial gastrocnemius muscle and the left lower paraspinal muscles showed increased spontaneous activity(+2PSW).      Conclusion: There is electrophysiologic evidence for acute/ongoing left S1 radiculopathy. No suggestion of mononeuropathy or polyneuropathy.    Sarina Ill M.D.  [facility] [place],  [address] Tel: [phone] Fax: [phone]    Cc:Dr. Louanne Skye     [facility]    Nerve / Sites Muscle Latency Ref. Amplitude Ref. Rel Amp Segments Distance Velocity Ref. Area    ms ms mV mV %  cm m/s m/s mVms  R Median - APB     Wrist APB 3.4 ?4.4 4.7 ?4.0 100 Wrist - APB 7   13.7     Upper arm APB 7.4  4.5  95 Upper arm - Wrist 21 53 ?49 12.0  R Ulnar - ADM     Wrist ADM 3.0 ?3.3 9.1 ?6.0 100 Wrist - ADM 7   28.9     B.Elbow ADM 6.5  8.7  95.6 B.Elbow - Wrist 19 54 ?49 28.3     A.Elbow ADM 8.2  9.1  105 A.Elbow - B.Elbow 10 58 ?49 30.7         A.Elbow - Wrist      L Peroneal - EDB     Ankle EDB 4.4 ?6.5 4.7 ?2.0 100 Ankle - EDB 9   14.5     Fib head EDB 10.9  4.3  91.3 Fib head - Ankle 32 50 ?44 13.7     Pop fossa EDB 12.8  4.1  96.2 Pop fossa - Fib head 10 53 ?44 13.2         Pop fossa - Ankle      L Tibial - AH     Ankle AH 4.2 ?5.8 13.3 ?4.0 100 Ankle - AH 9   35.5     Pop fossa AH 12.5  11.2  84.4 Pop fossa - Ankle 38 46 ?41 32.9  R Radial - EIP     Forearm EIP 1.8 ?2.9 5.5 ?2.0 100 Forearm - EIP 4  ?49 23.7     Elbow  EIP 4.3  5.4  98.5 Elbow - Forearm 16 64  27.7     Spiral Gr EIP 6.0  4.6  84.5 Spiral Gr - Elbow 10 60  23.4               SNC    Nerve / Sites Rec. Site Peak Lat Ref.  Amp Ref. Segments Distance Peak Diff Ref.    ms ms V V  cm ms ms  R Radial - Anatomical snuff box (Forearm)     Forearm Wrist 2.6 ?2.9 10 ?10 Forearm - Wrist 10    L Sural - Ankle (Calf)     Calf Ankle 3.5 ?4.4 9 ?6 Calf - Ankle 14    L Superficial peroneal - Ankle     Lat leg Ankle 3.0 ?4.4 7 ?6 Lat leg -  Ankle 14    R Median, Ulnar - Transcarpal comparison     Median Palm Wrist 2.2 ?2.2 73 ?35 Median Palm - Wrist 8       Ulnar Palm Wrist 2.1 ?2.2 11 ?12 Ulnar Palm - Wrist 8          Median Palm - Ulnar Palm  0.1 ?0.4  R Median - Orthodromic (Dig II, Mid palm)     Dig II Wrist 3.2 ?3.4 19 ?10 Dig II - Wrist 13    R Ulnar - Orthodromic, (Dig V, Mid palm)     Dig V Wrist 2.7 ?3.1 14 ?5 Dig V - Wrist 53                   F  Wave    Nerve F Lat Ref.   ms ms  R Ulnar - ADM 25.8 ?32.0  L Tibial - AH 45.5 ?56.0         EMG full       EMG Summary Table    Spontaneous MUAP Recruitment  Muscle IA Fib PSW Fasc Other Amp Dur. Poly Pattern  L. Vastus medialis Normal None None None _______ Normal Normal Normal Normal  L. Tibialis anterior Normal None None None _______ Normal Normal Normal Normal  L. Gastrocnemius (Medial head) Normal None 2+ None _______ Normal Normal Normal Normal  L. Biceps femoris (long head) Normal None None None _______ Normal Normal Normal Normal  L. Gluteus maximus Normal None None None _______ Normal Normal Normal Normal  L. Gluteus medius Normal None None None _______ Normal Normal Normal Normal  L. Lumbar paraspinals (low) Normal None 2+ None _______ Normal Normal Normal Normal

## 2018-07-05 NOTE — Progress Notes (Signed)
Full Name: Ronni Osterberg Gender: Female MRN #: [id number] Date of Birth: 02-02-1971    Visit Date: 07/05/2018 08:21 Age: 47 Years 2 Months Old Examining Physician: Sarina Ill, MD  Referring Physician: Louanne Skye, MD  History: 47 year old with acute on chronic left leg weakness, numbness, radicular symptoms. Also right hand radial nerve numbness.  Summary: EMG/NCS was performed on the right upper and left lower extremities.  All nerves were within normal limits (as detailed in the tables below).  Needle evaluation of the left medial gastrocnemius muscle and the left lower paraspinal muscles showed increased spontaneous activity(+2PSW).      Conclusion: There is electrophysiologic evidence for acute/ongoing left S1 radiculopathy. No suggestion of mononeuropathy or polyneuropathy.    Sarina Ill M.D.  [facility] [place], [place] [address] Tel: [phone] Fax: [phone]    Cc:Dr. Louanne Skye     [facility]    Nerve / Sites Muscle Latency Ref. Amplitude Ref. Rel Amp Segments Distance Velocity Ref. Area    ms ms mV mV %  cm m/s m/s mVms  R Median - APB     Wrist APB 3.4 ?4.4 4.7 ?4.0 100 Wrist - APB 7   13.7     Upper arm APB 7.4  4.5  95 Upper arm - Wrist 21 53 ?49 12.0  R Ulnar - ADM     Wrist ADM 3.0 ?3.3 9.1 ?6.0 100 Wrist - ADM 7   28.9     B.Elbow ADM 6.5  8.7  95.6 B.Elbow - Wrist 19 54 ?49 28.3     A.Elbow ADM 8.2  9.1  105 A.Elbow - B.Elbow 10 58 ?49 30.7         A.Elbow - Wrist      L Peroneal - EDB     Ankle EDB 4.4 ?6.5 4.7 ?2.0 100 Ankle - EDB 9   14.5     Fib head EDB 10.9  4.3  91.3 Fib head - Ankle 32 50 ?44 13.7     Pop fossa EDB 12.8  4.1  96.2 Pop fossa - Fib head 10 53 ?44 13.2         Pop fossa - Ankle      L Tibial - AH     Ankle AH 4.2 ?5.8 13.3 ?4.0 100 Ankle - AH 9   35.5     Pop fossa AH 12.5  11.2  84.4 Pop fossa - Ankle 38 46 ?41 32.9  R Radial - EIP     Forearm EIP 1.8 ?2.9 5.5 ?2.0 100 Forearm - EIP 4  ?49 23.7   Elbow EIP 4.3  5.4  98.5 Elbow - Forearm 16 64  27.7     Spiral Gr EIP 6.0  4.6  84.5 Spiral Gr - Elbow 10 60  23.4               SNC    Nerve / Sites Rec. Site Peak Lat Ref.  Amp Ref. Segments Distance Peak Diff Ref.    ms ms V V  cm ms ms  R Radial - Anatomical snuff box (Forearm)     Forearm Wrist 2.6 ?2.9 10 ?10 Forearm - Wrist 10    L Sural - Ankle (Calf)     Calf Ankle 3.5 ?4.4 9 ?6 Calf - Ankle 14    L Superficial peroneal - Ankle     Lat leg Ankle 3.0 ?4.4 7 ?6 Lat leg - Ankle  14    R Median, Ulnar - Transcarpal comparison     Median Palm Wrist 2.2 ?2.2 73 ?35 Median Palm - Wrist 8       Ulnar Palm Wrist 2.1 ?2.2 11 ?12 Ulnar Palm - Wrist 8          Median Palm - Ulnar Palm  0.1 ?0.4  R Median - Orthodromic (Dig II, Mid palm)     Dig II Wrist 3.2 ?3.4 19 ?10 Dig II - Wrist 13    R Ulnar - Orthodromic, (Dig V, Mid palm)     Dig V Wrist 2.7 ?3.1 14 ?5 Dig V - Wrist 49                   F  Wave    Nerve F Lat Ref.   ms ms  R Ulnar - ADM 25.8 ?32.0  L Tibial - AH 45.5 ?56.0         EMG full       EMG Summary Table    Spontaneous MUAP Recruitment  Muscle IA Fib PSW Fasc Other Amp Dur. Poly Pattern  L. Vastus medialis Normal None None None _______ Normal Normal Normal Normal  L. Tibialis anterior Normal None None None _______ Normal Normal Normal Normal  L. Gastrocnemius (Medial head) Normal None 2+ None _______ Normal Normal Normal Normal  L. Biceps femoris (long head) Normal None None None _______ Normal Normal Normal Normal  L. Gluteus maximus Normal None None None _______ Normal Normal Normal Normal  L. Gluteus medius Normal None None None _______ Normal Normal Normal Normal  L. Lumbar paraspinals (low) Normal None 2+ None _______ Normal Normal Normal Normal

## 2018-07-05 NOTE — Patient Instructions (Signed)
Gabapentin capsules or tablets What is this medicine? GABAPENTIN (GA ba pen tin) is used to control partial seizures in adults with epilepsy. It is also used to treat certain types of nerve pain. This medicine may be used for other purposes; ask your health care provider or pharmacist if you have questions. COMMON BRAND NAME(S): Active-PAC with Gabapentin, Gabarone, Neurontin What should I tell my health care provider before I take this medicine? They need to know if you have any of these conditions: -kidney disease -suicidal thoughts, plans, or attempt; a previous suicide attempt by you or a family member -an unusual or allergic reaction to gabapentin, other medicines, foods, dyes, or preservatives -pregnant or trying to get pregnant -breast-feeding How should I use this medicine? Take this medicine by mouth with a glass of water. Follow the directions on the prescription label. You can take it with or without food. If it upsets your stomach, take it with food.Take your medicine at regular intervals. Do not take it more often than directed. Do not stop taking except on your doctor's advice. If you are directed to break the 600 or 800 mg tablets in half as part of your dose, the extra half tablet should be used for the next dose. If you have not used the extra half tablet within 28 days, it should be thrown away. A special MedGuide will be given to you by the pharmacist with each prescription and refill. Be sure to read this information carefully each time. Talk to your pediatrician regarding the use of this medicine in children. Special care may be needed. Overdosage: If you think you have taken too much of this medicine contact a poison control center or emergency room at once. NOTE: This medicine is only for you. Do not share this medicine with others. What if I miss a dose? If you miss a dose, take it as soon as you can. If it is almost time for your next dose, take only that dose. Do not  take double or extra doses. What may interact with this medicine? Do not take this medicine with any of the following medications: -other gabapentin products This medicine may also interact with the following medications: -alcohol -antacids -antihistamines for allergy, cough and cold -certain medicines for anxiety or sleep -certain medicines for depression or psychotic disturbances -homatropine; hydrocodone -naproxen -narcotic medicines (opiates) for pain -phenothiazines like chlorpromazine, mesoridazine, prochlorperazine, thioridazine This list may not describe all possible interactions. Give your health care provider a list of all the medicines, herbs, non-prescription drugs, or dietary supplements you use. Also tell them if you smoke, drink alcohol, or use illegal drugs. Some items may interact with your medicine. What should I watch for while using this medicine? Visit your doctor or health care professional for regular checks on your progress. You may want to keep a record at home of how you feel your condition is responding to treatment. You may want to share this information with your doctor or health care professional at each visit. You should contact your doctor or health care professional if your seizures get worse or if you have any new types of seizures. Do not stop taking this medicine or any of your seizure medicines unless instructed by your doctor or health care professional. Stopping your medicine suddenly can increase your seizures or their severity. Wear a medical identification bracelet or chain if you are taking this medicine for seizures, and carry a card that lists all your medications. You may get drowsy, dizzy,   or have blurred vision. Do not drive, use machinery, or do anything that needs mental alertness until you know how this medicine affects you. To reduce dizzy or fainting spells, do not sit or stand up quickly, especially if you are an older patient. Alcohol can  increase drowsiness and dizziness. Avoid alcoholic drinks. Your mouth may get dry. Chewing sugarless gum or sucking hard candy, and drinking plenty of water will help. The use of this medicine may increase the chance of suicidal thoughts or actions. Pay special attention to how you are responding while on this medicine. Any worsening of mood, or thoughts of suicide or dying should be reported to your health care professional right away. Women who become pregnant while using this medicine may enroll in the [place] Antiepileptic Drug Pregnancy Registry by calling 1-888-233-2334. This registry collects information about the safety of antiepileptic drug use during pregnancy. What side effects may I notice from receiving this medicine? Side effects that you should report to your doctor or health care professional as soon as possible: -allergic reactions like skin rash, itching or hives, swelling of the face, lips, or tongue -worsening of mood, thoughts or actions of suicide or dying Side effects that usually do not require medical attention (report to your doctor or health care professional if they continue or are bothersome): -constipation -difficulty walking or controlling muscle movements -dizziness -nausea -slurred speech -tiredness -tremors -weight gain This list may not describe all possible side effects. Call your doctor for medical advice about side effects. You may report side effects to FDA at 1-800-FDA-1088. Where should I keep my medicine? Keep out of reach of children. This medicine may cause accidental overdose and death if it taken by other adults, children, or pets. Mix any unused medicine with a substance like cat litter or coffee grounds. Then throw the medicine away in a sealed container like a sealed bag or a coffee can with a lid. Do not use the medicine after the expiration date. Store at room temperature between 15 and 30 degrees C (59 and 86 degrees F). NOTE: This  sheet is a summary. It may not cover all possible information. If you have questions about this medicine, talk to your doctor, pharmacist, or health care provider.  2018 Elsevier/Gold Standard (2013-11-01 15:26:50)  

## 2018-07-05 NOTE — Progress Notes (Signed)
See procedure note.

## 2018-07-05 NOTE — Progress Notes (Signed)
History: 47 year old with acute on chronic left leg weakness, numbness, radicular symptoms. Also right hand dorsal numbness.   Reviewed MRI of the lumbar spine images with patient and agree with the following: MRI lumbar spine (without) demonstrating: - L5-S1: disc bulging and facet hypertrophy with mild spinal stenosis and moderate biforaminal stenosis  - L3-4: disc bulging and facet hypertrophy with mild right and moderate left foraminal stenosis  - L1-2, L2-3, L4-5: disc bulging and facet hypertrophy with mild biforaminal stenosis   Discussed with patient, reviewed imaging, reviewed spine model, this is likely left S1 radiculopathy based on exam, MRI and emg/ncs correlation.   Discussed obesity and role in degenerative disk disease. She also may have sleep apnea, recommend sleep test and sequela of untreated sleep apnea(she declines, discuss with pcp), weight loss, discussed the [facility] and will refer her there. Prescribed Gabapentin.    Orders Placed This Encounter  Procedures  . Ambulatory referral to [facility] ordered this encounter  Medications  . gabapentin (NEURONTIN) 300 MG capsule    Sig: Take 1 capsule (300 mg total) by mouth 3 (three) times daily.    Dispense:  90 capsule    Refill:  3    A total of 40 minutes was spent face-to-face with this patient. Over half this time was spent on counseling patient on the  1. Chronic left lumbar radiculopathy   2. Morbid obesity (HCC)   3. Lumbosacral radiculopathy at L5   4. Lumbosacral radiculopathy at S1     diagnosis and different diagnostic and therapeutic options, counseling and coordination of care, risks ans benefits of management, compliance, or risk factor reduction and education.  This does not include time spent on emg/ncs.

## 2018-07-09 ENCOUNTER — Other Ambulatory Visit: Payer: Self-pay | Admitting: Neurology

## 2018-07-17 ENCOUNTER — Encounter (INDEPENDENT_AMBULATORY_CARE_PROVIDER_SITE_OTHER): Payer: BLUE CROSS/BLUE SHIELD

## 2018-07-22 ENCOUNTER — Other Ambulatory Visit: Payer: Self-pay | Admitting: Family Medicine

## 2018-07-24 ENCOUNTER — Encounter: Payer: Self-pay | Admitting: Family Medicine

## 2018-07-25 NOTE — Telephone Encounter (Signed)
Pt called in stating she only has enough medication to last another day. Pt sent a mychart message regarding her insurance and needing prescriptions resent.

## 2018-07-26 ENCOUNTER — Ambulatory Visit (INDEPENDENT_AMBULATORY_CARE_PROVIDER_SITE_OTHER): Payer: BLUE CROSS/BLUE SHIELD | Admitting: Bariatrics

## 2018-07-26 ENCOUNTER — Encounter (INDEPENDENT_AMBULATORY_CARE_PROVIDER_SITE_OTHER): Payer: Self-pay | Admitting: Bariatrics

## 2018-07-26 ENCOUNTER — Other Ambulatory Visit: Payer: Self-pay

## 2018-07-26 VITALS — BP 114/71 | HR 83 | Temp 98.2°F | Ht 64.0 in | Wt 279.0 lb

## 2018-07-26 DIAGNOSIS — E119 Type 2 diabetes mellitus without complications: Secondary | ICD-10-CM | POA: Diagnosis not present

## 2018-07-26 DIAGNOSIS — R0602 Shortness of breath: Secondary | ICD-10-CM | POA: Diagnosis not present

## 2018-07-26 DIAGNOSIS — E1169 Type 2 diabetes mellitus with other specified complication: Secondary | ICD-10-CM | POA: Insufficient documentation

## 2018-07-26 DIAGNOSIS — Z9189 Other specified personal risk factors, not elsewhere classified: Secondary | ICD-10-CM

## 2018-07-26 DIAGNOSIS — Z6841 Body Mass Index (BMI) 40.0 and over, adult: Secondary | ICD-10-CM

## 2018-07-26 DIAGNOSIS — F3289 Other specified depressive episodes: Secondary | ICD-10-CM | POA: Diagnosis not present

## 2018-07-26 DIAGNOSIS — R5383 Other fatigue: Secondary | ICD-10-CM

## 2018-07-26 DIAGNOSIS — K760 Fatty (change of) liver, not elsewhere classified: Secondary | ICD-10-CM

## 2018-07-26 DIAGNOSIS — E559 Vitamin D deficiency, unspecified: Secondary | ICD-10-CM

## 2018-07-26 DIAGNOSIS — E876 Hypokalemia: Secondary | ICD-10-CM

## 2018-07-26 DIAGNOSIS — E7849 Other hyperlipidemia: Secondary | ICD-10-CM

## 2018-07-26 DIAGNOSIS — Z0289 Encounter for other administrative examinations: Secondary | ICD-10-CM

## 2018-07-26 MED ORDER — ATORVASTATIN CALCIUM 20 MG PO TABS: 20.0000 mg | ORAL_TABLET | Freq: Every day | ORAL | 0 refills | Status: DC

## 2018-07-26 MED ORDER — GLIMEPIRIDE 4 MG PO TABS: 4.0000 mg | ORAL_TABLET | Freq: Two times a day (BID) | ORAL | 0 refills | Status: DC

## 2018-07-26 MED ORDER — DEXLANSOPRAZOLE 60 MG PO CPDR: DELAYED_RELEASE_CAPSULE | ORAL | 0 refills | Status: DC

## 2018-07-26 NOTE — Telephone Encounter (Signed)
Sent 90-day refills.

## 2018-07-26 NOTE — Telephone Encounter (Addendum)
Received MyChart message from pt requesting 90-day refills be sent to [facility]. [See Pt Msg, 07/25/18.]  Next OV is 09/10/18.  E-scribed. Notified pt via [place].

## 2018-07-27 ENCOUNTER — Other Ambulatory Visit: Payer: Self-pay | Admitting: *Deleted

## 2018-07-27 LAB — INSULIN, RANDOM: INSULIN: 20 u[IU]/mL (ref 2.6–24.9)

## 2018-07-27 LAB — POTASSIUM: Potassium: 4.4 mmol/L (ref 3.5–5.2)

## 2018-07-27 MED ORDER — VENLAFAXINE HCL ER 225 MG PO TB24: 225.0000 mg | ORAL_TABLET | Freq: Every day | ORAL | 0 refills | Status: DC

## 2018-07-27 NOTE — Telephone Encounter (Signed)
Pt requesting 90d refill. States she is needing refills on all her maintenance meds and will contact office back with names of medications needed.

## 2018-07-30 ENCOUNTER — Encounter (INDEPENDENT_AMBULATORY_CARE_PROVIDER_SITE_OTHER): Payer: Self-pay | Admitting: Bariatrics

## 2018-07-30 NOTE — Progress Notes (Signed)
Office: [phone]  /  Fax: [phone]   Dear Dr. Jaynee Eagles,   Thank you for referring Lisa Curry to our clinic. The following note includes my evaluation and treatment recommendations.  HPI:   Chief Complaint: OBESITY    Lisa Curry has been referred by Lisa Curry B. Jaynee Eagles, MD for consultation regarding her obesity and obesity related comorbidities.    [name] (MR# [id number]) is a 47 y.o. female who presents on 07/30/2018 for obesity evaluation and treatment. Current BMI is Body mass index is 47.89 kg/m.Lisa Curry has been struggling with her weight for many years and has been unsuccessful in either losing weight, maintaining weight loss, or reaching her healthy weight goal.     Lisa Curry attended our information session and states she is currently in the action stage of change and ready to dedicate time achieving and maintaining a healthier weight. Lisa Curry is interested in becoming our patient and working on intensive lifestyle modifications including (but not limited to) diet, exercise and weight loss.    Lisa Curry states her family eats meals together she struggles with family and or coworkers weight loss sabotage her desired weight loss is 120 to 130 lbs she has been heavy most of  her life she started gaining weight after pregnancies her heaviest weight ever was 285 lbs. she states that she craves chocolate and salty carbohydrates (late afternoon to bedtime) she snacks frequently in the evenings she sometimes skips breakfast and lunch she does hide foods occasionally she is frequently drinking liquids with calories she frequently eats larger portions than normal  she struggles with emotional eating    [name] feels her energy is lower than it should be. This has worsened with weight gain and has not worsened recently. Lisa Curry admits to daytime somnolence and she admits to waking up still tired. Patient is at risk for obstructive sleep apnea. Patent has a history of symptoms of daytime  fatigue and morning fatigue. Patient generally gets 6 to 8 hours of sleep per night, and states she is sleeping reasonably well. Snoring is present. Apneic episodes are present. Epworth Sleepiness Score is 9  Dyspnea on exertion Lisa Curry notes increasing shortness of breath with certain activities and deconditioning. This seems to be worsening over time with weight gain. She notes getting out of breath sooner with activity than she used to. This has not gotten worse recently. [name] admits orthopnea.  Diabetes II Lisa Curry has a diagnosis of diabetes type II. She is currently taking Glimepiride and Tradjenta. Lisa Curry states fasting BGs range between 100 and 140 and she denies any lows. Last A1c was at 6.4. She has been working on intensive lifestyle modifications including diet, exercise, and weight loss to help control her blood glucose levels.  Hyperlipidemia Lisa Curry has hyperlipidemia and she is currently taking Atorvastatin. See lipids 05/16/18. Lisa Curry checked good in all parameters. She is attempting to improve her cholesterol levels with intensive lifestyle modification including a low saturated fat diet, exercise and weight loss. She denies myalgias.  Fatty Liver (based on labs) Lisa Curry has a diagnosis of fatty liver. Her BMI is over 40. She denies abdominal pain, nausea or vomiting. There is no ultrasound.  Hypokalemia [name] has a diagnosis of hypokalemia and her last 3 potassium levels were at 3.4, 3.4 and 3.9. She is not on diuretics and she is taking potassium per herself.  Vitamin D deficiency Lisa Curry has a diagnosis of vitamin D deficiency. She is currently taking OTC vit D and her last level was  at 36.49. She denies nausea, vomiting or muscle weakness.  At risk for osteopenia and osteoporosis Lisa Curry is at higher risk of osteopenia and osteoporosis due to vitamin D deficiency.   Depression with emotional eating behaviors Lisa Curry is struggling with emotional eating and using food for comfort to the extent that  it is negatively impacting her health. She often snacks when she is not hungry. Lisa Curry sometimes feels she is out of control and then feels guilty that she made poor food choices. She is attempting to work on behavior modification techniques to help reduce her emotional eating. She shows no sign of suicidal or homicidal ideations.  Depression screen [facility] 2/9 07/26/2018 05/16/2018 04/24/2017 02/27/2014 11/04/2013  Decreased Interest 1 3 2 1  0  Down, Depressed, Hopeless 1 1 2 2  0  PHQ - 2 Score 2 4 4 3  0  Altered sleeping 0 1 2 0 -  Tired, decreased energy 2 2 3 3  -  Change in appetite 2 2 3  0 -  Feeling bad or failure about yourself  2 1 1  0 -  Trouble concentrating 1 2 1  0 -  Moving slowly or fidgety/restless 0 1 1 0 -  Suicidal thoughts 0 0 0 0 -  PHQ-9 Score 9 13 15 6  -  Difficult doing work/chores Somewhat difficult - Somewhat difficult - -     Depression Screen Lisa Curry's Food and Mood (modified PHQ-9) score was  Depression screen PHQ 2/9 07/26/2018  Decreased Interest 1  Down, Depressed, Hopeless 1  PHQ - 2 Score 2  Altered sleeping 0  Tired, decreased energy 2  Change in appetite 2  Feeling bad or failure about yourself  2  Trouble concentrating 1  Moving slowly or fidgety/restless 0  Suicidal thoughts 0  PHQ-9 Score 9  Difficult doing work/chores Somewhat difficult    ALLERGIES: Allergies  Allergen Reactions  . Biaxin [Clarithromycin] Hives  . Metformin And Related Diarrhea    MEDICATIONS: Current Outpatient Medications on File Prior to Visit  Medication Sig Dispense Refill  . buPROPion (WELLBUTRIN) 100 MG tablet Take 1 tablet (100 mg total) by mouth 2 (two) times daily. 180 tablet 3  . Cholecalciferol (VITAMIN D3) 1000 UNITS CAPS Take 1 capsule (1,000 Units total) by mouth daily. 30 capsule   . clonazePAM (KLONOPIN) 0.5 MG tablet TAKE 1 TABLET BY MOUTH DAILY. MAY TAKE 1 ADDITIONAL TABLET DAILY IF NEEDED 60 tablet 1  . cyclobenzaprine (FLEXERIL) 10 MG tablet Take 1 tablet (10  mg total) by mouth 2 (two) times daily as needed for muscle spasms (sedation precautions). 30 tablet 1  . glucose blood (ONE TOUCH ULTRA TEST) test strip Use to check sugar twice daily and as needed. Dx: E11.65 300 each 3  . Lancet Devices (ONE TOUCH DELICA LANCING DEV) MISC Use as directed with lancets and one touch meter E11.65 1 each 0  . Multiple Vitamin (MULTIVITAMIN) capsule Take 1 capsule by mouth daily.    Glory Rosebush DELICA LANCETS 15B MISC Check blood sugar twice daily and as directed. Dx E11.65 200 each 0  . pioglitazone (ACTOS) 30 MG tablet Take 30 mg by mouth daily.    Marland Kitchen POTASSIUM PO Take by mouth daily.    . ranitidine (ZANTAC) 150 MG tablet Take 150 mg by mouth at bedtime.    . TRADJENTA 5 MG TABS tablet TAKE 1 TABLET BY MOUTH EVERY DAY 30 tablet 1   Current Facility-Administered Medications on File Prior to Visit  Medication Dose Route Frequency Provider Last  Rate Last Dose  . diclofenac sodium (VOLTAREN) 1 % transdermal gel 2 g  2 g Topical QID Jessy Oto, MD        PAST MEDICAL HISTORY: Past Medical History:  Diagnosis Date  . Allergy   . Anemia   . Anxiety   . Barrett esophagus    Don Bulla, have not received EGD report  . Chronic back pain   . Constipation   . Depression with anxiety   . Diabetes mellitus without complication ([place])    DSME 04/2014  . Edema, lower extremity   . Fatty liver   . Gastric polyp 05/2012   h/o EGD  . GERD (gastroesophageal reflux disease)   . History of Helicobacter pylori infection   . HLD (hyperlipidemia)   . Hyperlipidemia   . Vitamin D deficiency     PAST SURGICAL HISTORY: Past Surgical History:  Procedure Laterality Date  . CESAREAN SECTION  6/95, 4/98  . DILATION AND CURETTAGE OF UTERUS  2011  . ESOPHAGOGASTRODUODENOSCOPY  2012?   [facility]  . ESOPHAGOGASTRODUODENOSCOPY  10/2017   [facility] Hughs)  . LUMBAR DISC SURGERY  03/1995  . NASAL SINUS SURGERY  03/2001    SOCIAL HISTORY: Social History     Tobacco Use  . Smoking status: Never Smoker  . Smokeless tobacco: Never Used  Substance Use Topics  . Alcohol use: Yes    Alcohol/week: 0.0 standard drinks    Comment: occasional  . Drug use: No    FAMILY HISTORY: Family History  Problem Relation Age of Onset  . Breast cancer Maternal Grandmother 67  . Dementia Maternal Grandmother   . Heart disease Paternal Grandfather   . Other Sister        Postural Orthostatic Tachycardia Syndrome  . Supraventricular tachycardia Sister   . Other Maternal Aunt        hysterectomy for irregular bleed with abnormal pap  . Hyperlipidemia Mother   . Alzheimer's disease Maternal Grandfather     ROS: Review of Systems  Constitutional: Positive for malaise/fatigue.       + Breasts Pain (left)  HENT: Positive for congestion (nasal stuffiness).        + Dry Mouth  Eyes:       + Wear Glasses or Contacts  Respiratory: Positive for cough and shortness of breath (on exertion).   Cardiovascular: Positive for orthopnea.  Gastrointestinal: Positive for constipation and heartburn. Negative for abdominal pain, nausea and vomiting.  Genitourinary: Positive for frequency.  Musculoskeletal: Positive for back pain. Negative for myalgias.       Negative for muscle weakness  Endo/Heme/Allergies:       Negative for hypoglycemia  Psychiatric/Behavioral: Positive for depression. Negative for suicidal ideas.       + Stress    PHYSICAL EXAM: Blood pressure 114/71, pulse 83, temperature 98.2 F (36.8 C), temperature source Oral, height 5\' 4"  (1.626 m), weight 279 lb (126.6 kg), last menstrual period 07/01/2018, SpO2 98 %. Body mass index is 47.89 kg/m. Physical Exam  Constitutional: She is oriented to person, place, and time. She appears well-developed and well-nourished.  HENT:  Head: Normocephalic and atraumatic.  Nose: Nose normal.  M[place]pati = 4  Eyes: EOM are normal. No scleral icterus.  Neck: Normal range of motion. Neck supple. No  thyromegaly present.  Cardiovascular: Normal rate and regular rhythm.  Pulmonary/Chest: Effort normal. No respiratory distress.  Abdominal: Soft. There is no tenderness.  + Obesity  Musculoskeletal: Normal range of  motion. She exhibits edema (trace edema bilateral lower extremities).  Range of Motion normal in all 4 extremities  Neurological: She is alert and oriented to person, place, and time. Coordination normal.  Skin: Skin is warm and dry.  Psychiatric: She has a normal mood and affect. She expresses no homicidal and no suicidal ideation.  Vitals reviewed.   RECENT LABS AND TESTS: BMET    Component Value Date/Time   NA 140 05/16/2018 1747   K 4.4 07/26/2018 1045   CL 101 05/16/2018 1747   CO2 30 05/16/2018 1747   GLUCOSE 89 05/16/2018 1747   BUN 7 05/16/2018 1747   CREATININE 0.79 05/16/2018 1747   CREATININE 0.68 03/28/2014 1632   CALCIUM 9.1 05/16/2018 1747   GFRNONAA >90 06/21/2014 0309   GFRAA >90 06/21/2014 0309   Lab Results  Component Value Date   HGBA1C 6.4 05/16/2018   Lab Results  Component Value Date   INSULIN 20.0 07/26/2018   CBC    Component Value Date/Time   WBC 8.9 09/01/2016 1142   RBC 4.39 09/01/2016 1142   HGB 13.2 09/01/2016 1142   HCT 39.3 09/01/2016 1142   PLT 313.0 09/01/2016 1142   MCV 89.6 09/01/2016 1142   MCH 28.0 06/21/2014 0309   MCHC 33.7 09/01/2016 1142   RDW 15.2 09/01/2016 1142   LYMPHSABS 2.4 09/01/2016 1142   MONOABS 0.2 09/01/2016 1142   EOSABS 0.2 09/01/2016 1142   BASOSABS 0.0 09/01/2016 1142   Iron/TIBC/Ferritin/ %Sat    Component Value Date/Time   IRON 62 05/30/2016 1307   FERRITIN 55.2 05/30/2016 1307   IRONPCTSAT 16.4 (L) 05/30/2016 1307   Lipid Panel     Component Value Date/Time   CHOL 127 05/16/2018 1747   CHOL 123 10/30/2012   TRIG 124.0 05/16/2018 1747   TRIG 124 10/30/2012   HDL 50.10 05/16/2018 1747   CHOLHDL 3 05/16/2018 1747   VLDL 24.8 05/16/2018 1747   LDLCALC 52 05/16/2018 1747   LDLCALC  59 10/30/2012   LDLDIRECT 73.0 04/18/2017 0847   Hepatic Function Panel     Component Value Date/Time   PROT 7.1 05/16/2018 1747   ALBUMIN 4.1 05/16/2018 1747   AST 19 05/16/2018 1747   ALT 16 05/16/2018 1747   ALKPHOS 83 05/16/2018 1747   BILITOT 0.5 05/16/2018 1747      Component Value Date/Time   TSH 2.42 05/16/2018 1747   TSH 1.80 04/07/2016 1632   TSH 2.351 03/28/2014 1632    ECG  shows NSR with a rate of 88 BPM INDIRECT CALORIMETER done today shows a VO2 of 298 and a REE of 2073.  Her calculated basal metabolic rate is 8413 thus her basal metabolic rate is better than expected.    ASSESSMENT AND PLAN: Other fatigue - Plan: EKG 12-Lead  Shortness of breath on exertion  Type 2 diabetes mellitus without complication, without long-term current use of insulin (HCC) - Plan: Insulin, random, CANCELED: Insulin, random  Other hyperlipidemia  Fatty liver  Hypokalemia - Plan: Potassium, CANCELED: Potassium  Vitamin D deficiency  Other depression  At risk for osteoporosis  Class 3 severe obesity with serious comorbidity and body mass index (BMI) of 45.0 to 49.9 in adult, unspecified obesity type ([place])  PLAN: Fatigue Cianni was informed that her fatigue may be related to obesity, depression or many other causes. Labs will be ordered, and in the meanwhile Kimbely has agreed to work on diet, exercise and weight loss to help with fatigue. Proper sleep hygiene was  discussed including the need for 7-8 hours of quality sleep each night. We will consider a sleep study if she continues to have symptoms with weight loss.  Dyspnea on exertion Antoine's shortness of breath appears to be obesity related and exercise induced. She has agreed to work on weight loss and gradually increase exercise to treat her exercise induced shortness of breath. If Kelcy follows our instructions and loses weight without improvement of her shortness of breath, we will plan to refer to pulmonology. We will  monitor this condition regularly. Sieanna agrees to this plan.  Diabetes II Tima has been given extensive diabetes education by myself today including ideal fasting and post-prandial blood glucose readings, individual ideal Hgb A1c goals and hypoglycemia prevention. We discussed the importance of good blood sugar control to decrease the likelihood of diabetic complications such as nephropathy, neuropathy, limb loss, blindness, coronary artery disease, and death. We discussed the importance of intensive lifestyle modification including diet, exercise and weight loss as the first line treatment for diabetes. Leightyn agrees to continue checking fasting blood sugar and 2 hour post prandial blood sugar. She agrees to take half of 4 mg in the morning and half of 4 mg in the evening and if fasting blood sugar is 90, or she has symptoms and if it continues to run low, she will call.  Ryker agrees to follow up at the agreed upon time.  Hyperlipidemia Renay was informed of the American Heart Association Guidelines emphasizing intensive lifestyle modifications as the first line treatment for hyperlipidemia. We discussed many lifestyle modifications today in depth, and Merideth will continue to work on decreasing saturated fats such as fatty red meat, butter and many fried foods. She will also increase vegetables and lean protein in her diet and continue to work on exercise and weight loss efforts. Mckenley will continue to take Atorvastatin and follow up as directed.  Fatty Liver (based on labs) We discussed the likely diagnosis of fatty liver disease today and how this condition is obesity related. Kaelah was educated on her risk of developing NASH or even liver failure and the only proven treatment for fatty liver disease was weight loss of 5 to 10% and exercise (cardio and resistance). Marilin agreed to start with her weight loss efforts with healthier diet and exercise as an essential part of her treatment plan.  Hypokalemia We  will check potassium level today and Johnae will follow up with our clinic in 2 weeks.  Vitamin D Deficiency Veva was informed that low vitamin D levels contributes to fatigue and are associated with obesity, breast, and colon cancer. She will continue to take OTC vitamin D and will follow up for routine testing of vitamin D, at least 2-3 times per year. She was informed of the risk of over-replacement of vitamin D and agrees to not increase her dose unless she discusses this with [place] first. We will recheck vitamin D in the future and Catlin agrees to follow up at the agreed upon time.  At risk for osteopenia and osteoporosis Elham was given extended  (15 minutes) osteoporosis prevention counseling today. Tabathia is at risk for osteopenia and osteoporosis due to her vitamin D deficiency. She was encouraged to take her vitamin D and follow her higher calcium diet and increase strengthening exercise to help strengthen her bones and decrease her risk of osteopenia and osteoporosis.  Depression with Emotional Eating Behaviors We discussed behavior modification techniques today to help Melah deal with her emotional eating and depression. We  will refer to Dr. Mallie Mussel, our bariatric psychologist.  Depression Screen Lisa Curry had a mildly positive depression screening. Depression is commonly associated with obesity and often results in emotional eating behaviors. We will monitor this closely and work on CBT to help improve the non-hunger eating patterns.   Obesity Claude is currently in the action stage of change and her goal is to continue with weight loss efforts. I recommend Meriah begin the structured treatment plan as follows:  She has agreed to follow the Category 3 plan modified for no peanut butter Michol has been instructed to eventually work up to a goal of 150 minutes of combined cardio and strengthening exercise per week for weight loss and overall health benefits. We discussed the following Behavioral  Modification Strategies today: increase H2O intake, no skipping meals, better snacking choices,  increasing lean protein intake, decreasing simple carbohydrates , increasing vegetables, decrease eating out, work on meal planning and easy cooking plans, emotional eating strategies and ways to avoid night time snacking   She was informed of the importance of frequent follow up visits to maximize her success with intensive lifestyle modifications for her multiple health conditions. She was informed we would discuss her lab results at her next visit unless there is a critical issue that needs to be addressed sooner. Twilla agreed to keep her next visit at the agreed upon time to discuss these results.    OBESITY BEHAVIORAL INTERVENTION VISIT  Today's visit was # 1   Starting weight: 279 lbs Starting date: 07/26/2018 Today's weight : 279 lbs  Today's date: 07/26/2018 Total lbs lost to date: 0   ASK: We discussed the diagnosis of obesity with Lisa Curry today and Lisa Curry agreed to give [place] permission to discuss obesity behavioral modification therapy today.  ASSESS: Elaynah has the diagnosis of obesity and her BMI today is 24.87 Yanet is in the action stage of change   ADVISE: Lillith was educated on the multiple health risks of obesity as well as the benefit of weight loss to improve her health. She was advised of the need for long term treatment and the importance of lifestyle modifications to improve her current health and to decrease her risk of future health problems.  AGREE: Multiple dietary modification options and treatment options were discussed and  Leeana agreed to follow the recommendations documented in the above note.  ARRANGE: Joyclyn was educated on the importance of frequent visits to treat obesity as outlined per CMS and USPSTF guidelines and agreed to schedule her next follow up appointment today.  Corey Skains, am acting as Location manager for [employer]. Owens Shark, DO  I have  reviewed the above documentation for accuracy and completeness, and I agree with the above. -Jearld Lesch, DO

## 2018-07-31 ENCOUNTER — Other Ambulatory Visit: Payer: Self-pay | Admitting: Family Medicine

## 2018-07-31 NOTE — Telephone Encounter (Signed)
Cyclobenzaprine Last filled:  06/30/18, #30/0 Last OV:  05/16/18, CPE Next OV:  09/10/18, 4 mo f/u

## 2018-08-02 ENCOUNTER — Other Ambulatory Visit: Payer: Self-pay | Admitting: Family Medicine

## 2018-08-02 NOTE — Telephone Encounter (Signed)
Name of Medication: Clonazepam Name of Pharmacy: [facility] Last Fill or Written Date and Quantity: 07/03/18, #60/1 Last Office Visit and Type: 05/16/18, CPE Next Office Visit and Type: 09/10/18, f/u Last Controlled Substance Agreement Date: 04/24/17 Last UDS: 10/16/14

## 2018-08-02 NOTE — Telephone Encounter (Signed)
Name of Medication: Klonopin 0.5 mg # 60 x 1 on 06/04/18 Name of Pharmacy: [facility] or Written Date and Quantity: # 60 x 1 on 06/04/18 Last Office Visit and Type: 05/16/18 CPX Next Office Visit and Type: 09/10/18 4 mth FU

## 2018-08-03 NOTE — Telephone Encounter (Signed)
Eprescribed.

## 2018-08-08 ENCOUNTER — Encounter (INDEPENDENT_AMBULATORY_CARE_PROVIDER_SITE_OTHER): Payer: Self-pay | Admitting: Bariatrics

## 2018-08-10 ENCOUNTER — Telehealth: Payer: Self-pay

## 2018-08-10 NOTE — Telephone Encounter (Signed)
Can leave a detailed message on patient's voicemail if she does not answer

## 2018-08-10 NOTE — Telephone Encounter (Signed)
Patient called and states that for the past 2 years she has been struggling with family gatherings and finds herself crying a lot every time. Gets overwhelmed and thinks maybe she has social-anxiety issue. She states she has not spoken to you about this and is wondering if she can adjust her medications. She is still taking Bupropion and Venlafaxine as noted in the chart. Patient is also taking Clonazepam 0.5 mg 1 tablet twice daily every day. She has family gathering this Saturday and is wondering maybe she can take more of Clonazepam or what can she do. I advised patient that she needs an appointment because this has not been addressed with PCP before. Patient states she has an appointment in December that was scheduled for 4 months f/u and she is not able to come in twice so she would like to know if she can come in sooner and be seen for this issue and her 4 months f/u or wait till December and also be seen for both issues. Patient would also like to know what can she do in meantime for her anxiety issue before an appointment. CB: [phone]

## 2018-08-13 ENCOUNTER — Ambulatory Visit (INDEPENDENT_AMBULATORY_CARE_PROVIDER_SITE_OTHER): Payer: BLUE CROSS/BLUE SHIELD | Admitting: Bariatrics

## 2018-08-13 MED ORDER — BUPROPION HCL ER (SR) 150 MG PO TB12: 150.0000 mg | ORAL_TABLET | Freq: Two times a day (BID) | ORAL | 3 refills | Status: DC

## 2018-08-13 NOTE — Telephone Encounter (Signed)
Spoke with pt relaying message and instructions per Dr. Darnell Level.  Pt verbalizes understanding.  She declines psych appt at this time. Says she may be interested after the holidays but will call when she decides.

## 2018-08-13 NOTE — Telephone Encounter (Signed)
Current regimen is wellbutrin 100mg  bid, venlafaxine 225mg  daily, and klonopin 0.5mg  bid.  Let's try a more extended release wellbutrin - to start at one daily and if tolerated after 4 days could increase to twice daily. I've sent this in to [facility] to take in place of her immediate release wellbutrin. Ok to keep December appt to review effect.  Would also offer psychiatry appt for further review of medication options if pt interested.

## 2018-08-15 ENCOUNTER — Encounter (INDEPENDENT_AMBULATORY_CARE_PROVIDER_SITE_OTHER): Payer: Self-pay | Admitting: Bariatrics

## 2018-08-15 ENCOUNTER — Ambulatory Visit (INDEPENDENT_AMBULATORY_CARE_PROVIDER_SITE_OTHER): Payer: BLUE CROSS/BLUE SHIELD | Admitting: Bariatrics

## 2018-08-15 VITALS — BP 120/71 | HR 76 | Temp 97.5°F | Ht 64.0 in | Wt 269.0 lb

## 2018-08-15 DIAGNOSIS — E559 Vitamin D deficiency, unspecified: Secondary | ICD-10-CM

## 2018-08-15 DIAGNOSIS — K760 Fatty (change of) liver, not elsewhere classified: Secondary | ICD-10-CM | POA: Diagnosis not present

## 2018-08-15 DIAGNOSIS — R0683 Snoring: Secondary | ICD-10-CM | POA: Diagnosis not present

## 2018-08-15 DIAGNOSIS — E7849 Other hyperlipidemia: Secondary | ICD-10-CM

## 2018-08-15 DIAGNOSIS — Z9189 Other specified personal risk factors, not elsewhere classified: Secondary | ICD-10-CM | POA: Diagnosis not present

## 2018-08-15 DIAGNOSIS — Z6841 Body Mass Index (BMI) 40.0 and over, adult: Secondary | ICD-10-CM

## 2018-08-15 DIAGNOSIS — E119 Type 2 diabetes mellitus without complications: Secondary | ICD-10-CM | POA: Diagnosis not present

## 2018-08-15 MED ORDER — VITAMIN D (ERGOCALCIFEROL) 1.25 MG (50000 UNIT) PO CAPS: 50000.0000 [IU] | ORAL_CAPSULE | ORAL | 0 refills | Status: DC

## 2018-08-22 ENCOUNTER — Encounter (INDEPENDENT_AMBULATORY_CARE_PROVIDER_SITE_OTHER): Payer: Self-pay | Admitting: Bariatrics

## 2018-08-22 ENCOUNTER — Ambulatory Visit (INDEPENDENT_AMBULATORY_CARE_PROVIDER_SITE_OTHER): Payer: BLUE CROSS/BLUE SHIELD | Admitting: Psychology

## 2018-08-22 DIAGNOSIS — Z6841 Body Mass Index (BMI) 40.0 and over, adult: Secondary | ICD-10-CM

## 2018-08-22 NOTE — Progress Notes (Signed)
Office: [phone]  /  Fax: [phone]   HPI:   Chief Complaint: OBESITY Lisa Curry is here to discuss her progress with her obesity treatment plan. She is on the  follow the Category 3 plan and is following her eating plan approximately 50 % of the time. She states she is exercising 0 minutes 0 times per week. Nakeya has struggled with the plan. She states it is "too much meat". She did feel hungry occasionally on the plan, in the afternoon. She denies any significant cravings.   Her weight is 269 lb (122 kg) today and has had a weight loss of 10 pounds over a period of 2 weeks since her last visit. She has lost 10 lbs since starting treatment with [place].  Diabetes II Lisa Curry has a diagnosis of diabetes type II. Lisa Curry states fasting BGs range between 120 and 170 and denies any hypoglycemic episodes. Last A1c was 6.4 and insulin level of 20.0. She has been working on intensive lifestyle modifications including diet, exercise, and weight loss to help control her blood glucose levels. She is currently taking glimepiride, tradjenta, and actos.   Hyperlipidemia Lisa Curry has hyperlipidemia and has been trying to improve her cholesterol levels with intensive lifestyle modification including a low saturated fat diet, exercise and weight loss. She denies any chest pain, claudication or myalgias.  Fatty Liver Lisa Curry has a diagnosis of fatty liver based on previous labs per herself. She states she has not had an ultrasound. Last ALT, AST were within normal limits. She denies abdominal pain and nausea/vomiting.   Snoring Lisa Curry has been snoring. She has had possible apneic episodes.   Vitamin D deficiency Lisa Curry has a diagnosis of vitamin D deficiency. She is not currently taking vit D and denies nausea, vomiting or muscle weakness.  Ref. Range 05/16/2018 17:47  VITD Latest Ref Range: 30.00 - 100.00 ng/mL 36.49   Depression with emotional eating behaviors Lisa Curry is struggling with emotional eating and using food for  comfort to the extent that it is negatively impacting her health. She often snacks when she is not hungry. Lisa Curry sometimes feels she is out of control and then feels guilty that she made poor food choices. She has been working on behavior modification techniques to help reduce her emotional eating and has been somewhat successful. She was referred to Dr. Mallie Mussel, with no appointment. She shows no sign of suicidal or homicidal ideations.  Depression screen [facility] 2/9 07/26/2018 05/16/2018 04/24/2017 02/27/2014 11/04/2013  Decreased Interest 1 3 2 1  0  Down, Depressed, Hopeless 1 1 2 2  0  PHQ - 2 Score 2 4 4 3  0  Altered sleeping 0 1 2 0 -  Tired, decreased energy 2 2 3 3  -  Change in appetite 2 2 3  0 -  Feeling bad or failure about yourself  2 1 1  0 -  Trouble concentrating 1 2 1  0 -  Moving slowly or fidgety/restless 0 1 1 0 -  Suicidal thoughts 0 0 0 0 -  PHQ-9 Score 9 13 15 6  -  Difficult doing work/chores Somewhat difficult - Somewhat difficult - -    At risk for osteopenia and osteoporosis Lisa Curry is at higher risk of osteopenia and osteoporosis due to vitamin D deficiency.    ALLERGIES: Allergies  Allergen Reactions  . Biaxin [Clarithromycin] Hives  . Metformin And Related Diarrhea    MEDICATIONS: Current Outpatient Medications on File Prior to Visit  Medication Sig Dispense Refill  . atorvastatin (LIPITOR) 20 MG tablet  Take 1 tablet (20 mg total) by mouth daily at 6 PM. 90 tablet 0  . buPROPion (WELLBUTRIN SR) 150 MG 12 hr tablet Take 1 tablet (150 mg total) by mouth 2 (two) times daily. Take once daily in am for first 4 days 60 tablet 3  . Cholecalciferol (VITAMIN D3) 1000 UNITS CAPS Take 1 capsule (1,000 Units total) by mouth daily. 30 capsule   . clonazePAM (KLONOPIN) 0.5 MG tablet TAKE 1 TABLET BY MOUTH DAILY. MAY TAKE 1 ADDITIONAL TABLET DAILY IF NEEDED 60 tablet 1  . cyclobenzaprine (FLEXERIL) 10 MG tablet PLEASE SEE ATTACHED FOR DETAILED DIRECTIONS 30 tablet 1  . dexlansoprazole  (DEXILANT) 60 MG capsule TAKE 1 CAPSULE (60 MG TOTAL) BY MOUTH DAILY. 90 capsule 0  . glimepiride (AMARYL) 4 MG tablet Take 1 tablet (4 mg total) by mouth 2 (two) times daily. 180 tablet 0  . glucose blood (ONE TOUCH ULTRA TEST) test strip Use to check sugar twice daily and as needed. Dx: E11.65 300 each 3  . Lancet Devices (ONE TOUCH DELICA LANCING DEV) MISC Use as directed with lancets and one touch meter E11.65 1 each 0  . Multiple Vitamin (MULTIVITAMIN) capsule Take 1 capsule by mouth daily.    Glory Rosebush DELICA LANCETS 22G MISC Check blood sugar twice daily and as directed. Dx E11.65 200 each 0  . pioglitazone (ACTOS) 30 MG tablet Take 30 mg by mouth daily.    Marland Kitchen POTASSIUM PO Take by mouth daily.    . ranitidine (ZANTAC) 150 MG tablet Take 150 mg by mouth at bedtime.    . TRADJENTA 5 MG TABS tablet TAKE 1 TABLET BY MOUTH EVERY DAY 30 tablet 1  . Venlafaxine HCl 225 MG TB24 Take 1 tablet (225 mg total) by mouth daily. 90 tablet 0   Current Facility-Administered Medications on File Prior to Visit  Medication Dose Route Frequency Provider Last Rate Last Dose  . diclofenac sodium (VOLTAREN) 1 % transdermal gel 2 g  2 g Topical QID Jessy Oto, MD        PAST MEDICAL HISTORY: Past Medical History:  Diagnosis Date  . Allergy   . Anemia   . Anxiety   . Barrett esophagus    Don Bulla, have not received EGD report  . Chronic back pain   . Constipation   . Depression with anxiety   . Diabetes mellitus without complication ([place])    DSME 04/2014  . Edema, lower extremity   . Fatty liver   . Gastric polyp 05/2012   h/o EGD  . GERD (gastroesophageal reflux disease)   . History of Helicobacter pylori infection   . HLD (hyperlipidemia)   . Hyperlipidemia   . Vitamin D deficiency     PAST SURGICAL HISTORY: Past Surgical History:  Procedure Laterality Date  . CESAREAN SECTION  6/95, 4/98  . DILATION AND CURETTAGE OF UTERUS  2011  . ESOPHAGOGASTRODUODENOSCOPY  2012?   Ferdinand Lango at  [facility]  . ESOPHAGOGASTRODUODENOSCOPY  10/2017   [facility] Ardis Hughs)  . LUMBAR DISC SURGERY  03/1995  . NASAL SINUS SURGERY  03/2001    SOCIAL HISTORY: Social History   Tobacco Use  . Smoking status: Never Smoker  . Smokeless tobacco: Never Used  Substance Use Topics  . Alcohol use: Yes    Alcohol/week: 0.0 standard drinks    Comment: occasional  . Drug use: No    FAMILY HISTORY: Family History  Problem Relation Age of Onset  . Breast cancer  Maternal Grandmother 74  . Dementia Maternal Grandmother   . Heart disease Paternal Grandfather   . Other Sister        Postural Orthostatic Tachycardia Syndrome  . Supraventricular tachycardia Sister   . Other Maternal Aunt        hysterectomy for irregular bleed with abnormal pap  . Hyperlipidemia Mother   . Alzheimer's disease Maternal Grandfather     ROS: Review of Systems  Constitutional: Positive for weight loss.  Cardiovascular: Negative for chest pain and claudication.  Gastrointestinal: Negative for abdominal pain, nausea and vomiting.  Musculoskeletal: Negative for myalgias.       Negative for muscle weakness  Endo/Heme/Allergies:       Negative for hypoglycemia  Psychiatric/Behavioral: Positive for depression. Negative for suicidal ideas.       Negative for homicidal ideations    PHYSICAL EXAM: Blood pressure 120/71, pulse 76, temperature (!) 97.5 F (36.4 C), temperature source Oral, height 5\' 4"  (1.626 m), weight 269 lb (122 kg), SpO2 100 %. Body mass index is 46.17 kg/m. Physical Exam  Constitutional: She is oriented to person, place, and time. She appears well-developed and well-nourished.  HENT:  Head: Normocephalic.  Eyes: Pupils are equal, round, and reactive to light.  Neck: Normal range of motion.  Cardiovascular: Normal rate.  Pulmonary/Chest: Effort normal.  Neurological: She is alert and oriented to person, place, and time.  Skin: Skin is warm and dry.  Psychiatric: She has a normal mood  and affect. Her behavior is normal.  Vitals reviewed.   RECENT LABS AND TESTS: BMET    Component Value Date/Time   NA 140 05/16/2018 1747   K 4.4 07/26/2018 1045   CL 101 05/16/2018 1747   CO2 30 05/16/2018 1747   GLUCOSE 89 05/16/2018 1747   BUN 7 05/16/2018 1747   CREATININE 0.79 05/16/2018 1747   CREATININE 0.68 03/28/2014 1632   CALCIUM 9.1 05/16/2018 1747   GFRNONAA >90 06/21/2014 0309   GFRAA >90 06/21/2014 0309   Lab Results  Component Value Date   HGBA1C 6.4 05/16/2018   HGBA1C 6.3 12/11/2017   HGBA1C 6.3 08/09/2017   HGBA1C 6.8 (H) 04/18/2017   HGBA1C 7.2 (H) 12/14/2016   Lab Results  Component Value Date   INSULIN 20.0 07/26/2018   CBC    Component Value Date/Time   WBC 8.9 09/01/2016 1142   RBC 4.39 09/01/2016 1142   HGB 13.2 09/01/2016 1142   HCT 39.3 09/01/2016 1142   PLT 313.0 09/01/2016 1142   MCV 89.6 09/01/2016 1142   MCH 28.0 06/21/2014 0309   MCHC 33.7 09/01/2016 1142   RDW 15.2 09/01/2016 1142   LYMPHSABS 2.4 09/01/2016 1142   MONOABS 0.2 09/01/2016 1142   EOSABS 0.2 09/01/2016 1142   BASOSABS 0.0 09/01/2016 1142   Iron/TIBC/Ferritin/ %Sat    Component Value Date/Time   IRON 62 05/30/2016 1307   FERRITIN 55.2 05/30/2016 1307   IRONPCTSAT 16.4 (L) 05/30/2016 1307   Lipid Panel     Component Value Date/Time   CHOL 127 05/16/2018 1747   CHOL 123 10/30/2012   TRIG 124.0 05/16/2018 1747   TRIG 124 10/30/2012   HDL 50.10 05/16/2018 1747   CHOLHDL 3 05/16/2018 1747   VLDL 24.8 05/16/2018 1747   LDLCALC 52 05/16/2018 1747   LDLCALC 59 10/30/2012   LDLDIRECT 73.0 04/18/2017 0847   Hepatic Function Panel     Component Value Date/Time   PROT 7.1 05/16/2018 1747   ALBUMIN 4.1 05/16/2018 1747  AST 19 05/16/2018 1747   ALT 16 05/16/2018 1747   ALKPHOS 83 05/16/2018 1747   BILITOT 0.5 05/16/2018 1747      Component Value Date/Time   TSH 2.42 05/16/2018 1747   TSH 1.80 04/07/2016 1632   TSH 2.351 03/28/2014 1632    Ref. Range  05/16/2018 17:47  VITD Latest Ref Range: 30.00 - 100.00 ng/mL 36.49   ASSESSMENT AND PLAN: Type 2 diabetes mellitus without complication, without long-term current use of insulin (HCC)  Other hyperlipidemia  Fatty liver  Snoring - Plan: Ambulatory referral to Neurology  Vitamin D deficiency - Plan: Vitamin D, Ergocalciferol, (DRISDOL) 1.25 MG (50000 UT) CAPS capsule  At risk for osteoporosis  Class 3 severe obesity with serious comorbidity and body mass index (BMI) of 45.0 to 49.9 in adult, unspecified obesity type ([place])  PLAN: Diabetes II Lisa Curry has been given extensive diabetes education by myself today including ideal fasting and post-prandial blood glucose readings, individual ideal HgA1c goals  and hypoglycemia prevention. We discussed the importance of good blood sugar control to decrease the likelihood of diabetic complications such as nephropathy, neuropathy, limb loss, blindness, coronary artery disease, and death. We discussed the importance of intensive lifestyle modification including diet, exercise and weight loss as the first line treatment for diabetes. Jaydon agrees to continue her diabetes medications and will follow up at the agreed upon time.  Hyperlipidemia Lisa Curry was informed of the American Heart Association Guidelines emphasizing intensive lifestyle modifications as the first line treatment for hyperlipidemia. We discussed many lifestyle modifications today in depth, and Lisa Curry will continue to work on decreasing saturated fats such as fatty red meat, butter and many fried foods. She will also increase vegetables and lean protein in her diet and continue to work on exercise and weight loss efforts.  Fatty Liver We discussed fatty liver. We will continue to monitor. Agrees to follow up with our clinic as directed.   Snoring We discussed snoring. We will refer to [facility] for sleep study consultation. Agrees to follow up with our clinic as directed.   Vitamin D  Deficiency Lisa Curry was informed that low vitamin D levels contributes to fatigue and are associated with obesity, breast, and colon cancer. She agrees to start to take prescription Vit D @50 ,000 IU every week #4 with no refills and will follow up for routine testing of vitamin D, at least 2-3 times per year. She was informed of the risk of over-replacement of vitamin D and agrees to not increase her dose unless she discusses this with [place] first. Agrees to follow up with our clinic as directed.   Depression with Emotional Eating Behaviors We discussed behavior modification techniques today to help Lisa Curry deal with her emotional eating and depression. She agrees to referral to Dr. Mallie Mussel, psychologist, and discussed cognitive behavioral therapy. She agreed to follow up as directed.  At risk for osteopenia and osteoporosis Lisa Curry was given extended  (15 minutes) osteoporosis prevention counseling today. Lisa Curry is at risk for osteopenia and osteoporsis due to her vitamin D deficiency. She was encouraged to take her vitamin D and follow her higher calcium diet and increase strengthening exercise to help strengthen her bones and decrease her risk of osteopenia and osteoporosis.  Obesity Lisa Curry is currently in the action stage of change. As such, her goal is to continue with weight loss efforts She has agreed to follow the Category 3 plan Lisa Curry has been instructed to work up to a goal of 150 minutes of combined cardio  and strengthening exercise per week for weight loss and overall health benefits. We discussed the following Behavioral Modification Strategies today: increasing lean protein intake, decreasing simple carbohydrates , increasing vegetables, decrease eating out, work on meal planning and easy cooking plans, increasing water intake, no skipping meals, keeping healthy foods in the home, celebration eating strategies, and holiday eating strategies.    Meelah has agreed to follow up with our clinic in 2 weeks.  She was informed of the importance of frequent follow up visits to maximize her success with intensive lifestyle modifications for her multiple health conditions.   OBESITY BEHAVIORAL INTERVENTION VISIT  Today's visit was # 2   Starting weight: 279 lb Starting date: 07/26/18 Today's weight : Weight: 269 lb (122 kg)  Today's date: 08/15/18 Total lbs lost to date: 10    ASK: We discussed the diagnosis of obesity with [name] today and [name] agreed to give [place] permission to discuss obesity behavioral modification therapy today.  ASSESS: Sophronia has the diagnosis of obesity and her BMI today is 42.15 Chattie is in the action stage of change   ADVISE: Loralee was educated on the multiple health risks of obesity as well as the benefit of weight loss to improve her health. She was advised of the need for long term treatment and the importance of lifestyle modifications to improve her current health and to decrease her risk of future health problems.  AGREE: Multiple dietary modification options and treatment options were discussed and  Mayci agreed to follow the recommendations documented in the above note.  ARRANGE: Raynisha was educated on the importance of frequent visits to treat obesity as outlined per CMS and USPSTF guidelines and agreed to schedule her next follow up appointment today.  Leary Roca, am acting as transcriptionist for [employer], DO   I have reviewed the above documentation for accuracy and completeness, and I agree with the above. -Jearld Lesch, DO

## 2018-08-24 ENCOUNTER — Ambulatory Visit (INDEPENDENT_AMBULATORY_CARE_PROVIDER_SITE_OTHER): Payer: BLUE CROSS/BLUE SHIELD | Admitting: Family Medicine

## 2018-08-24 ENCOUNTER — Encounter: Payer: Self-pay | Admitting: Family Medicine

## 2018-08-24 VITALS — BP 120/74 | HR 81 | Temp 97.8°F | Ht 64.0 in | Wt 271.5 lb

## 2018-08-24 DIAGNOSIS — B9789 Other viral agents as the cause of diseases classified elsewhere: Secondary | ICD-10-CM

## 2018-08-24 DIAGNOSIS — E119 Type 2 diabetes mellitus without complications: Secondary | ICD-10-CM | POA: Diagnosis not present

## 2018-08-24 DIAGNOSIS — J069 Acute upper respiratory infection, unspecified: Secondary | ICD-10-CM | POA: Diagnosis not present

## 2018-08-24 LAB — POCT GLYCOSYLATED HEMOGLOBIN (HGB A1C): Hemoglobin A1C: 5.6 % (ref 4.0–5.6)

## 2018-08-24 NOTE — Assessment & Plan Note (Signed)
Anticipate viral given short duration, benign exam. No localizing signs of bacterial infection. Supportive care reviewed. Update if not improving with treatment. Pt declines Rx codeine cough syrup today.

## 2018-08-24 NOTE — Assessment & Plan Note (Signed)
Update A1c today 

## 2018-08-24 NOTE — Patient Instructions (Addendum)
Fingerstick A1c today.  You have a viral upper respiratory infection. Antibiotics are not needed for this.  Viral infections usually take 7-10 days to resolve.  The cough can last a few weeks to go away. Continue ibuprofen 600mg  twice daily with meals for next 5 days.  Push fluids and plenty of rest. Please return if you are not improving as expected, or if you have high fevers (>101.5) or difficulty swallowing or worsening productive cough. Call clinic with questions.  Good to see you today. I hope you start feeling better soon.

## 2018-08-24 NOTE — Progress Notes (Signed)
BP 120/74 (BP Location: Left Arm, Patient Position: Sitting, Cuff Size: Large)   Pulse 81   Temp 97.8 F (36.6 C) (Oral)   Ht 5\' 4"  (1.626 m)   Wt 271 lb 8 oz (123.2 kg)   LMP 08/01/2018   SpO2 99%   BMI 46.60 kg/m    CC: ST, cough Subjective:    Patient ID: Lisa Curry, female    DOB: Apr 09, 1971, 47 y.o.   MRN: [id number]  HPI: Lisa Curry is a 47 y.o. female presenting on 08/24/2018 for Cough (C/o somewhat productive cough for about 1 wk. Also, c/o fatigue, runny nose, sinus congestion and loss of appetite. Tried Allegra D, Flonase, ibuprofen and humidifier. ); Sore Throat (C/o sore throat about 1 wk. ); and Labs (Request labs today. )   1 wk h/o ST, productive cough of green mucous, fatigue, RN, congestion, no appetite. Some ear and tooth pain, frontal headaches. PNdrainage.  So far has tried allegra D, flonase, mucinex and ibuprofen, humidifier, neti pot.   No fevers/chills, dyspnea or wheezing. No sick contacts at home.  Non smoker.   Requests f/u labs today.  She was started on weekly vit D last [facility]   Relevant past medical, surgical, family and social history reviewed and updated as indicated. Interim medical history since our last visit reviewed. Allergies and medications reviewed and updated. Outpatient Medications Prior to Visit  Medication Sig Dispense Refill  . atorvastatin (LIPITOR) 20 MG tablet Take 1 tablet (20 mg total) by mouth daily at 6 PM. 90 tablet 0  . buPROPion (WELLBUTRIN SR) 150 MG 12 hr tablet Take 1 tablet (150 mg total) by mouth 2 (two) times daily. Take once daily in am for first 4 days 60 tablet 3  . clonazePAM (KLONOPIN) 0.5 MG tablet TAKE 1 TABLET BY MOUTH DAILY. MAY TAKE 1 ADDITIONAL TABLET DAILY IF NEEDED 60 tablet 1  . cyclobenzaprine (FLEXERIL) 10 MG tablet PLEASE SEE ATTACHED FOR DETAILED DIRECTIONS 30 tablet 1  . dexlansoprazole (DEXILANT) 60 MG capsule TAKE 1 CAPSULE (60 MG TOTAL) BY MOUTH DAILY. 90  capsule 0  . glimepiride (AMARYL) 4 MG tablet Take 1 tablet (4 mg total) by mouth 2 (two) times daily. 180 tablet 0  . glucose blood (ONE TOUCH ULTRA TEST) test strip Use to check sugar twice daily and as needed. Dx: E11.65 300 each 3  . Lancet Devices (ONE TOUCH DELICA LANCING DEV) MISC Use as directed with lancets and one touch meter E11.65 1 each 0  . Multiple Vitamin (MULTIVITAMIN) capsule Take 1 capsule by mouth daily.    Glory Rosebush DELICA LANCETS 97W MISC Check blood sugar twice daily and as directed. Dx E11.65 200 each 0  . pioglitazone (ACTOS) 30 MG tablet Take 30 mg by mouth daily.    Marland Kitchen POTASSIUM PO Take by mouth daily.    . ranitidine (ZANTAC) 150 MG tablet Take 150 mg by mouth at bedtime.    . TRADJENTA 5 MG TABS tablet TAKE 1 TABLET BY MOUTH EVERY DAY 30 tablet 1  . Venlafaxine HCl 225 MG TB24 Take 1 tablet (225 mg total) by mouth daily. 90 tablet 0  . Vitamin D, Ergocalciferol, (DRISDOL) 1.25 MG (50000 UT) CAPS capsule Take 1 capsule (50,000 Units total) by mouth every 7 (seven) days. 4 capsule 0  . Cholecalciferol (VITAMIN D3) 1000 UNITS CAPS Take 1 capsule (1,000 Units total) by mouth daily. 30 capsule    Facility-Administered Medications Prior  to Visit  Medication Dose Route Frequency Provider Last Rate Last Dose  . diclofenac sodium (VOLTAREN) 1 % transdermal gel 2 g  2 g Topical QID Jessy Oto, MD         Per HPI unless specifically indicated in ROS section below Review of Systems     Objective:    BP 120/74 (BP Location: Left Arm, Patient Position: Sitting, Cuff Size: Large)   Pulse 81   Temp 97.8 F (36.6 C) (Oral)   Ht 5\' 4"  (1.626 m)   Wt 271 lb 8 oz (123.2 kg)   LMP 08/01/2018   SpO2 99%   BMI 46.60 kg/m   Wt Readings from Last 3 Encounters:  08/24/18 271 lb 8 oz (123.2 kg)  08/15/18 269 lb (122 kg)  07/26/18 279 lb (126.6 kg)    Physical Exam  Constitutional: She appears well-developed and well-nourished. No distress.  HENT:  Head: Normocephalic  and atraumatic.  Right Ear: Hearing, tympanic membrane, external ear and ear canal normal.  Left Ear: Hearing, tympanic membrane, external ear and ear canal normal.  Nose: No mucosal edema or rhinorrhea. Right sinus exhibits no maxillary sinus tenderness and no frontal sinus tenderness. Left sinus exhibits no maxillary sinus tenderness and no frontal sinus tenderness.  Mouth/Throat: Uvula is midline, oropharynx is clear and moist and mucous membranes are normal. No oropharyngeal exudate, posterior oropharyngeal edema, posterior oropharyngeal erythema or tonsillar abscesses.  Eyes: Pupils are equal, round, and reactive to light. Conjunctivae and EOM are normal. No scleral icterus.  Neck: Normal range of motion. Neck supple.  Cardiovascular: Normal rate, regular rhythm, normal heart sounds and intact distal pulses.  No murmur heard. Pulmonary/Chest: Effort normal and breath sounds normal. No respiratory distress. She has no wheezes. She has no rales.  Lungs clear  Lymphadenopathy:    She has no cervical adenopathy.  Skin: Skin is warm and dry. No rash noted.  Nursing note and vitals reviewed.  Results for orders placed or performed in visit on 08/24/18  POCT glycosylated hemoglobin (Hb A1C)  Result Value Ref Range   Hemoglobin A1C 5.6 4.0 - 5.6 %   HbA1c POC (<> result, manual entry)     HbA1c, POC (prediabetic range)     HbA1c, POC (controlled diabetic range)        Assessment & Plan:   Problem List Items Addressed This Visit    Viral URI with cough - Primary    Anticipate viral given short duration, benign exam. No localizing signs of bacterial infection. Supportive care reviewed. Update if not improving with treatment. Pt declines Rx codeine cough syrup today.      Type 2 diabetes mellitus without complication, without long-term current use of insulin (HCC)   Relevant Orders   POCT glycosylated hemoglobin (Hb A1C) (Completed)   Controlled type 2 diabetes mellitus without  complication, without long-term current use of insulin (HCC)    Update A1c today.           No orders of the defined types were placed in this encounter.  Orders Placed This Encounter  Procedures  . POCT glycosylated hemoglobin (Hb A1C)    Follow up plan: Return if symptoms worsen or fail to improve.  Ria Bush, MD

## 2018-08-25 ENCOUNTER — Encounter: Payer: Self-pay | Admitting: Family Medicine

## 2018-08-27 NOTE — Telephone Encounter (Signed)
Did you want to send new rx for 50 mg tabs?

## 2018-09-03 ENCOUNTER — Encounter (INDEPENDENT_AMBULATORY_CARE_PROVIDER_SITE_OTHER): Payer: Self-pay | Admitting: Bariatrics

## 2018-09-03 ENCOUNTER — Ambulatory Visit (INDEPENDENT_AMBULATORY_CARE_PROVIDER_SITE_OTHER): Payer: BLUE CROSS/BLUE SHIELD | Admitting: Bariatrics

## 2018-09-03 VITALS — BP 127/75 | HR 85 | Temp 97.9°F | Ht 64.0 in | Wt 267.0 lb

## 2018-09-03 DIAGNOSIS — Z6841 Body Mass Index (BMI) 40.0 and over, adult: Secondary | ICD-10-CM

## 2018-09-03 DIAGNOSIS — E119 Type 2 diabetes mellitus without complications: Secondary | ICD-10-CM | POA: Diagnosis not present

## 2018-09-03 DIAGNOSIS — E7849 Other hyperlipidemia: Secondary | ICD-10-CM | POA: Diagnosis not present

## 2018-09-03 DIAGNOSIS — F3289 Other specified depressive episodes: Secondary | ICD-10-CM

## 2018-09-03 DIAGNOSIS — Z9189 Other specified personal risk factors, not elsewhere classified: Secondary | ICD-10-CM | POA: Diagnosis not present

## 2018-09-03 MED ORDER — INSULIN PEN NEEDLE 31G X 6 MM MISC: 0 refills | Status: DC

## 2018-09-03 MED ORDER — LIRAGLUTIDE 18 MG/3ML ~~LOC~~ SOPN: 0.6000 mg | PEN_INJECTOR | SUBCUTANEOUS | 0 refills | Status: DC

## 2018-09-04 ENCOUNTER — Encounter (INDEPENDENT_AMBULATORY_CARE_PROVIDER_SITE_OTHER): Payer: Self-pay | Admitting: Bariatrics

## 2018-09-04 ENCOUNTER — Other Ambulatory Visit (INDEPENDENT_AMBULATORY_CARE_PROVIDER_SITE_OTHER): Payer: Self-pay

## 2018-09-04 DIAGNOSIS — E559 Vitamin D deficiency, unspecified: Secondary | ICD-10-CM

## 2018-09-04 MED ORDER — VITAMIN D (ERGOCALCIFEROL) 1.25 MG (50000 UNIT) PO CAPS: 50000.0000 [IU] | ORAL_CAPSULE | ORAL | 0 refills | Status: DC

## 2018-09-04 NOTE — Progress Notes (Signed)
Office: [phone]  /  Fax: [phone]   HPI:   Chief Complaint: OBESITY Lisa Curry is here to discuss her progress with her obesity treatment plan. She is on the Category 3 plan and is following her eating plan approximately 25 % of the time. She states she is exercising 0 minutes 0 times per week. Lisa Curry has been sick since Thanksgiving. She denies excessive hunger or cravings.  Her weight is 267 lb (121.1 kg) today and has had a weight loss of 2 pounds over a period of 2 to 3 weeks since her last visit. She has lost 12 lbs since starting treatment with [place].  Diabetes II Lisa Curry has a diagnosis of diabetes type II. She is taking Tradjenta, Actos and Amaryl. Lisa Curry states fasting BGs range between 100 and 200 with 1 low at 76, 2 hour post prandial is 160. Last A1c was at 6.4 and insulin level was at 20.0 She has been working on intensive lifestyle modifications including diet, exercise, and weight loss to help control her blood glucose levels.  Hyperlipidemia Lisa Curry has hyperlipidemia and she is taking Atorvastatin with no side effects. She has been trying to improve her cholesterol levels with intensive lifestyle modification including a low saturated fat diet, exercise and weight loss. She denies myalgias.  At risk for cardiovascular disease Lisa Curry is at a higher than average risk for cardiovascular disease due to obesity, diabetes and hyperlipidemia. She currently denies any chest pain.  Depression with emotional eating behaviors Lisa Curry was referred to Dr. Mallie Mussel. She struggles with emotional eating and using food for comfort to the extent that it is negatively impacting her health. She often snacks when she is not hungry. Lisa Curry sometimes feels she is out of control and then feels guilty that she made poor food choices. She has been working on behavior modification techniques to help reduce her emotional eating and has been somewhat successful. She shows no sign of suicidal or homicidal  ideations.  Depression screen Lisa Curry 2/9 07/26/2018 05/16/2018 04/24/2017 02/27/2014 11/04/2013  Decreased Interest 1 3 2 1  0  Down, Depressed, Hopeless 1 1 2 2  0  PHQ - 2 Score 2 4 4 3  0  Altered sleeping 0 1 2 0 -  Tired, decreased energy 2 2 3 3  -  Change in appetite 2 2 3  0 -  Feeling bad or failure about yourself  2 1 1  0 -  Trouble concentrating 1 2 1  0 -  Moving slowly or fidgety/restless 0 1 1 0 -  Suicidal thoughts 0 0 0 0 -  PHQ-9 Score 9 13 15 6  -  Difficult doing work/chores Somewhat difficult - Somewhat difficult - -     ASSESSMENT AND PLAN:  Type 2 diabetes mellitus without complication, without long-term current use of insulin (HCC) - Plan: liraglutide (VICTOZA) 18 MG/3ML SOPN  Other hyperlipidemia  Other depression - with emotional eating  At risk for heart disease  Class 3 severe obesity with serious comorbidity and body mass index (BMI) of 45.0 to 49.9 in adult, unspecified obesity type (HCC)  PLAN:  Diabetes II Lisa Curry has been given extensive diabetes education by myself today including ideal fasting and post-prandial blood glucose readings, individual ideal Hgb A1c goals  and hypoglycemia prevention. We discussed the importance of good blood sugar control to decrease the likelihood of diabetic complications such as nephropathy, neuropathy, limb loss, blindness, coronary artery disease, and death. We discussed the importance of intensive lifestyle modification including diet, exercise and weight loss as the  first line treatment for diabetes. Lisa Curry agrees to take Victoza 18 mg daily (will begin 0.6 mg daily) #1 month supply with no refills and 31G pen needles #100 with no refills. She will stop Amaryl when she starts the Victoza #1 month supply with no refills. [name] agreed to follow up at the agreed upon time.  Hyperlipidemia Lisa Curry was informed of the American Heart Association Guidelines emphasizing intensive lifestyle modifications as the first line treatment for  hyperlipidemia. We discussed many lifestyle modifications today in depth, and Lisa Curry will continue to work on decreasing saturated fats such as fatty red meat, butter and many fried foods. She will also increase vegetables and lean protein in her diet and continue to work on exercise and weight loss efforts. Lisa Curry will continue her medications as prescribed and follow up at the agreed upon time.  Cardiovascular risk counseling Lisa Curry was given extended (15 minutes) coronary artery disease prevention counseling today. She is 47 y.o. female and has risk factors for heart disease including obesity, diabetes and hyperlipidemia. We discussed intensive lifestyle modifications today with an emphasis on specific weight loss instructions and strategies. Pt was also informed of the importance of increasing exercise and decreasing saturated fats to help prevent heart disease.  Depression with Emotional Eating Behaviors We discussed behavior modification techniques today to help Lisa Curry deal with her emotional eating and depression. She will continue Wellbutrin SR per her PCP and follow up as directed.  Obesity Lisa Curry is currently in the action stage of change. As such, her goal is to continue with weight loss efforts She has agreed to follow the Category 3 plan Lisa Curry has been instructed to work up to a goal of 150 minutes of combined cardio and strengthening exercise per week for weight loss and overall health benefits. We discussed the following Behavioral Modification Strategies today: increase H2O intake, increasing lean protein intake, decreasing simple carbohydrates, increasing vegetables and continue to do meal planning  Lisa Curry has agreed to follow up with our clinic in 2 weeks. She was informed of the importance of frequent follow up visits to maximize her success with intensive lifestyle modifications for her multiple health conditions.  ALLERGIES: Allergies  Allergen Reactions  . Biaxin [Clarithromycin] Hives   . Metformin And Related Diarrhea    MEDICATIONS: Current Outpatient Medications on File Prior to Visit  Medication Sig Dispense Refill  . atorvastatin (LIPITOR) 20 MG tablet Take 1 tablet (20 mg total) by mouth daily at 6 PM. 90 tablet 0  . buPROPion (WELLBUTRIN SR) 150 MG 12 hr tablet Take 1 tablet (150 mg total) by mouth 2 (two) times daily. Take once daily in am for first 4 days 60 tablet 3  . clonazePAM (KLONOPIN) 0.5 MG tablet TAKE 1 TABLET BY MOUTH DAILY. MAY TAKE 1 ADDITIONAL TABLET DAILY IF NEEDED 60 tablet 1  . cyclobenzaprine (FLEXERIL) 10 MG tablet PLEASE SEE ATTACHED FOR DETAILED DIRECTIONS 30 tablet 1  . dexlansoprazole (DEXILANT) 60 MG capsule TAKE 1 CAPSULE (60 MG TOTAL) BY MOUTH DAILY. 90 capsule 0  . dextromethorphan-guaiFENesin (MUCINEX DM) 30-600 MG 12hr tablet Take 1 tablet by mouth as needed for cough.    . fluticasone (FLONASE) 50 MCG/ACT nasal spray Place into both nostrils daily.    Marland Kitchen glimepiride (AMARYL) 4 MG tablet Take 1 tablet (4 mg total) by mouth 2 (two) times daily. 180 tablet 0  . glucose blood (ONE TOUCH ULTRA TEST) test strip Use to check sugar twice daily and as needed. Dx: E11.65 300 each 3  .  ibuprofen (ADVIL,MOTRIN) 600 MG tablet Take 600 mg by mouth daily.    Elmore Guise Devices (ONE TOUCH DELICA LANCING DEV) MISC Use as directed with lancets and one touch meter E11.65 1 each 0  . Multiple Vitamin (MULTIVITAMIN) capsule Take 1 capsule by mouth daily.    Glory Rosebush DELICA LANCETS 14N MISC Check blood sugar twice daily and as directed. Dx E11.65 200 each 0  . pioglitazone (ACTOS) 30 MG tablet Take 30 mg by mouth daily.    Marland Kitchen POTASSIUM PO Take by mouth daily.    . ranitidine (ZANTAC) 150 MG tablet Take 150 mg by mouth at bedtime.    . TRADJENTA 5 MG TABS tablet TAKE 1 TABLET BY MOUTH EVERY DAY 30 tablet 1  . Venlafaxine HCl 225 MG TB24 Take 1 tablet (225 mg total) by mouth daily. 90 tablet 0  . Vitamin D, Ergocalciferol, (DRISDOL) 1.25 MG (50000 UT) CAPS  capsule Take 1 capsule (50,000 Units total) by mouth every 7 (seven) days. 4 capsule 0   Current Facility-Administered Medications on File Prior to Visit  Medication Dose Route Frequency Provider Last Rate Last Dose  . diclofenac sodium (VOLTAREN) 1 % transdermal gel 2 g  2 g Topical QID Jessy Oto, MD        PAST MEDICAL HISTORY: Past Medical History:  Diagnosis Date  . Allergy   . Anemia   . Anxiety   . Barrett esophagus    Lisa Curry, have not received EGD report  . Chronic back pain   . Constipation   . Depression with anxiety   . Diabetes mellitus without complication ([place])    DSME 04/2014  . Edema, lower extremity   . Fatty liver   . Gastric polyp 05/2012   h/o EGD  . GERD (gastroesophageal reflux disease)   . History of Helicobacter pylori infection   . HLD (hyperlipidemia)   . Hyperlipidemia   . Vitamin D deficiency     PAST SURGICAL HISTORY: Past Surgical History:  Procedure Laterality Date  . CESAREAN SECTION  6/95, 4/98  . DILATION AND CURETTAGE OF UTERUS  2011  . ESOPHAGOGASTRODUODENOSCOPY  2012?   [facility]  . ESOPHAGOGASTRODUODENOSCOPY  10/2017   [facility] Hughs)  . LUMBAR DISC SURGERY  03/1995  . NASAL SINUS SURGERY  03/2001    SOCIAL HISTORY: Social History   Tobacco Use  . Smoking status: Never Smoker  . Smokeless tobacco: Never Used  Substance Use Topics  . Alcohol use: Yes    Alcohol/week: 0.0 standard drinks    Comment: occasional  . Drug use: No    FAMILY HISTORY: Family History  Problem Relation Age of Onset  . Breast cancer Maternal Grandmother 98  . Dementia Maternal Grandmother   . Heart disease Paternal Grandfather   . Other Sister        Postural Orthostatic Tachycardia Syndrome  . Supraventricular tachycardia Sister   . Other Maternal Aunt        hysterectomy for irregular bleed with abnormal pap  . Hyperlipidemia Mother   . Alzheimer's disease Maternal Grandfather     ROS: Review of Systems   Constitutional: Positive for weight loss.  Cardiovascular: Negative for chest pain.  Musculoskeletal: Negative for myalgias.  Endo/Heme/Allergies:       Positive for hypoglycemia Negative for polyphagia Negative for cravings  Psychiatric/Behavioral: Positive for depression. Negative for suicidal ideas.    PHYSICAL EXAM: Blood pressure 127/75, pulse 85, temperature 97.9 F (36.6 C), temperature  source Oral, height 5\' 4"  (1.626 m), weight 267 lb (121.1 kg), SpO2 98 %. Body mass index is 45.83 kg/m. Physical Exam Vitals signs reviewed.  Constitutional:      Appearance: Normal appearance. She is well-developed. She is obese.  Cardiovascular:     Rate and Rhythm: Normal rate.  Pulmonary:     Effort: Pulmonary effort is normal.  Musculoskeletal: Normal range of motion.  Skin:    General: Skin is warm and dry.  Neurological:     Mental Status: She is alert and oriented to person, place, and time.  Psychiatric:        Mood and Affect: Mood normal.        Behavior: Behavior normal.        Thought Content: Thought content does not include homicidal or suicidal ideation.     RECENT LABS AND TESTS: BMET    Component Value Date/Time   NA 140 05/16/2018 1747   K 4.4 07/26/2018 1045   CL 101 05/16/2018 1747   CO2 30 05/16/2018 1747   GLUCOSE 89 05/16/2018 1747   BUN 7 05/16/2018 1747   CREATININE 0.79 05/16/2018 1747   CREATININE 0.68 03/28/2014 1632   CALCIUM 9.1 05/16/2018 1747   GFRNONAA >90 06/21/2014 0309   GFRAA >90 06/21/2014 0309   Lab Results  Component Value Date   HGBA1C 5.6 08/24/2018   HGBA1C 6.4 05/16/2018   HGBA1C 6.3 12/11/2017   HGBA1C 6.3 08/09/2017   HGBA1C 6.8 (H) 04/18/2017   Lab Results  Component Value Date   INSULIN 20.0 07/26/2018   CBC    Component Value Date/Time   WBC 8.9 09/01/2016 1142   RBC 4.39 09/01/2016 1142   HGB 13.2 09/01/2016 1142   HCT 39.3 09/01/2016 1142   PLT 313.0 09/01/2016 1142   MCV 89.6 09/01/2016 1142   MCH  28.0 06/21/2014 0309   MCHC 33.7 09/01/2016 1142   RDW 15.2 09/01/2016 1142   LYMPHSABS 2.4 09/01/2016 1142   MONOABS 0.2 09/01/2016 1142   EOSABS 0.2 09/01/2016 1142   BASOSABS 0.0 09/01/2016 1142   Iron/TIBC/Ferritin/ %Sat    Component Value Date/Time   IRON 62 05/30/2016 1307   FERRITIN 55.2 05/30/2016 1307   IRONPCTSAT 16.4 (L) 05/30/2016 1307   Lipid Panel     Component Value Date/Time   CHOL 127 05/16/2018 1747   CHOL 123 10/30/2012   TRIG 124.0 05/16/2018 1747   TRIG 124 10/30/2012   HDL 50.10 05/16/2018 1747   CHOLHDL 3 05/16/2018 1747   VLDL 24.8 05/16/2018 1747   LDLCALC 52 05/16/2018 1747   LDLCALC 59 10/30/2012   LDLDIRECT 73.0 04/18/2017 0847   Hepatic Function Panel     Component Value Date/Time   PROT 7.1 05/16/2018 1747   ALBUMIN 4.1 05/16/2018 1747   AST 19 05/16/2018 1747   ALT 16 05/16/2018 1747   ALKPHOS 83 05/16/2018 1747   BILITOT 0.5 05/16/2018 1747      Component Value Date/Time   TSH 2.42 05/16/2018 1747   TSH 1.80 04/07/2016 1632   TSH 2.351 03/28/2014 1632    Ref. Range 05/16/2018 17:47  VITD Latest Ref Range: 30.00 - 100.00 ng/mL 36.49     OBESITY BEHAVIORAL INTERVENTION VISIT  Today's visit was # 3   Starting weight: 279 lbs Starting date: 07/26/2018 Today's weight : 267 lbs Today's date: 09/03/2018 Total lbs lost to date: 12   ASK: We discussed the diagnosis of obesity with [name] today and [name] agreed to  give [place] permission to discuss obesity behavioral modification therapy today.  ASSESS: Amie has the diagnosis of obesity and her BMI today is 45.81 Richa is in the action stage of change   ADVISE: Shadasia was educated on the multiple health risks of obesity as well as the benefit of weight loss to improve her health. She was advised of the need for long term treatment and the importance of lifestyle modifications to improve her current health and to decrease her risk of future health problems.  AGREE: Multiple  dietary modification options and treatment options were discussed and  Tiffaney agreed to follow the recommendations documented in the above note.  ARRANGE: Tariya was educated on the importance of frequent visits to treat obesity as outlined per CMS and USPSTF guidelines and agreed to schedule her next follow up appointment today.  Corey Skains, am acting as Location manager for [employer]. Owens Shark, DO  I have reviewed the above documentation for accuracy and completeness, and I agree with the above. -Jearld Lesch, DO

## 2018-09-05 ENCOUNTER — Other Ambulatory Visit: Payer: Self-pay

## 2018-09-06 ENCOUNTER — Other Ambulatory Visit (INDEPENDENT_AMBULATORY_CARE_PROVIDER_SITE_OTHER): Payer: Self-pay

## 2018-09-06 ENCOUNTER — Other Ambulatory Visit: Payer: Self-pay | Admitting: Family Medicine

## 2018-09-06 DIAGNOSIS — E119 Type 2 diabetes mellitus without complications: Secondary | ICD-10-CM

## 2018-09-06 MED ORDER — LIRAGLUTIDE 18 MG/3ML ~~LOC~~ SOPN: 0.6000 mg | PEN_INJECTOR | Freq: Every day | SUBCUTANEOUS | 0 refills | Status: DC
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
## 2018-09-10 ENCOUNTER — Other Ambulatory Visit: Payer: Self-pay | Admitting: Family Medicine

## 2018-09-10 ENCOUNTER — Ambulatory Visit: Payer: Self-pay | Admitting: Family Medicine

## 2018-09-13 ENCOUNTER — Other Ambulatory Visit: Payer: Self-pay | Admitting: Family Medicine

## 2018-09-20 ENCOUNTER — Ambulatory Visit (INDEPENDENT_AMBULATORY_CARE_PROVIDER_SITE_OTHER): Payer: BLUE CROSS/BLUE SHIELD | Admitting: Psychology

## 2018-09-20 ENCOUNTER — Telehealth (INDEPENDENT_AMBULATORY_CARE_PROVIDER_SITE_OTHER): Payer: Self-pay | Admitting: Family Medicine

## 2018-09-20 ENCOUNTER — Telehealth (INDEPENDENT_AMBULATORY_CARE_PROVIDER_SITE_OTHER): Payer: Self-pay | Admitting: Psychology

## 2018-09-20 DIAGNOSIS — F3289 Other specified depressive episodes: Secondary | ICD-10-CM | POA: Diagnosis not present

## 2018-09-20 NOTE — Telephone Encounter (Signed)
Dr. Mallie Mussel, Patient Lisa Curry stated she will need prior authorization for her appointment with you on 10/08/18. Please let her know what she needs to do for this. [phone].

## 2018-09-20 NOTE — Telephone Encounter (Signed)
Patient stated she is needing RX for needles.  [facility]. Any questions patient may be reached at [phone]

## 2018-09-20 NOTE — Telephone Encounter (Signed)
Prescription sent in to the pt pharmacy. [place], [place]

## 2018-09-21 ENCOUNTER — Encounter: Payer: Self-pay | Admitting: Family Medicine

## 2018-09-25 ENCOUNTER — Encounter

## 2018-09-25 ENCOUNTER — Encounter (INDEPENDENT_AMBULATORY_CARE_PROVIDER_SITE_OTHER): Payer: Self-pay | Admitting: Bariatrics

## 2018-09-25 ENCOUNTER — Encounter: Payer: Self-pay | Admitting: Neurology

## 2018-09-25 ENCOUNTER — Ambulatory Visit (INDEPENDENT_AMBULATORY_CARE_PROVIDER_SITE_OTHER): Payer: BLUE CROSS/BLUE SHIELD | Admitting: Neurology

## 2018-09-25 ENCOUNTER — Ambulatory Visit (INDEPENDENT_AMBULATORY_CARE_PROVIDER_SITE_OTHER): Payer: BLUE CROSS/BLUE SHIELD | Admitting: Bariatrics

## 2018-09-25 VITALS — BP 136/79 | HR 84 | Ht 64.0 in | Wt 263.0 lb

## 2018-09-25 VITALS — BP 101/66 | HR 80 | Temp 97.7°F | Ht 64.0 in | Wt 260.0 lb

## 2018-09-25 DIAGNOSIS — Z6841 Body Mass Index (BMI) 40.0 and over, adult: Secondary | ICD-10-CM

## 2018-09-25 DIAGNOSIS — R351 Nocturia: Secondary | ICD-10-CM | POA: Diagnosis not present

## 2018-09-25 DIAGNOSIS — E119 Type 2 diabetes mellitus without complications: Secondary | ICD-10-CM | POA: Diagnosis not present

## 2018-09-25 DIAGNOSIS — G4719 Other hypersomnia: Secondary | ICD-10-CM

## 2018-09-25 DIAGNOSIS — F3289 Other specified depressive episodes: Secondary | ICD-10-CM

## 2018-09-25 DIAGNOSIS — Z9189 Other specified personal risk factors, not elsewhere classified: Secondary | ICD-10-CM | POA: Diagnosis not present

## 2018-09-25 DIAGNOSIS — R0681 Apnea, not elsewhere classified: Secondary | ICD-10-CM

## 2018-09-25 DIAGNOSIS — E559 Vitamin D deficiency, unspecified: Secondary | ICD-10-CM | POA: Diagnosis not present

## 2018-09-25 DIAGNOSIS — R0683 Snoring: Secondary | ICD-10-CM

## 2018-09-25 DIAGNOSIS — R51 Headache: Secondary | ICD-10-CM

## 2018-09-25 DIAGNOSIS — R519 Headache, unspecified: Secondary | ICD-10-CM

## 2018-09-25 MED ORDER — BUPROPION HCL ER (SR) 100 MG PO TB12: 100.0000 mg | ORAL_TABLET | Freq: Two times a day (BID) | ORAL | 1 refills | Status: DC

## 2018-09-25 MED ORDER — TOPIRAMATE 50 MG PO TABS: 50.0000 mg | ORAL_TABLET | Freq: Every day | ORAL | 0 refills | Status: DC

## 2018-09-25 MED ORDER — BUPROPION HCL ER (SR) 100 MG PO TB12: 100.0000 mg | ORAL_TABLET | Freq: Two times a day (BID) | ORAL | 6 refills | Status: DC

## 2018-09-25 NOTE — Patient Instructions (Signed)

## 2018-09-25 NOTE — Addendum Note (Signed)
Addended by: Ria Bush on: 09/25/2018 10:42 PM   Modules accepted: Orders

## 2018-09-25 NOTE — Progress Notes (Signed)
Office: [phone]  /  Fax: [phone]   HPI:   Chief Complaint: OBESITY Lisa Curry is here to discuss her progress with her obesity treatment plan. She is on the Category 3 plan and is following her eating plan approximately 10 % of the time. She states she is exercising 0 minutes 0 times per week. Lisa Curry is doing well overall. She occasionally wants to snack on something between dinner and bedtime. Her weight is 260 lb (117.9 kg) today and has had a weight loss of 7 pounds over a period of 3 weeks since her last visit. She has lost 19 lbs since starting treatment with [place].  Vitamin D deficiency Lisa Curry has a diagnosis of vitamin D deficiency. She is currently taking high dose vit D and her last level was at 36.49. She  denies nausea, vomiting or muscle weakness.  At risk for osteopenia and osteoporosis Lisa Curry is at higher risk of osteopenia and osteoporosis due to vitamin D deficiency.   Diabetes II Lisa Curry has a diagnosis of diabetes type II. She is taking Tradjenta, Actos and Victoza 0.6 mg. Lisa Curry states fasting BGs range between 90 and 100's and she denies any hypoglycemic episodes. Last A1c was at 5.6 She has been working on intensive lifestyle modifications including diet, exercise, and weight loss to help control her blood glucose levels.  Depression with emotional eating behaviors Lisa Curry is struggling with emotional eating and using food for comfort to the extent that it is negatively impacting her health. She often snacks when she is not hungry. Lisa Curry sometimes feels she is out of control and then feels guilty that she made poor food choices. Lisa Curry is currently taking Bupropion. She has been working on behavior modification techniques to help reduce her emotional eating and has been somewhat successful. She shows no sign of suicidal or homicidal ideations.  Depression screen [facility] 2/9 09/20/2018 07/26/2018 05/16/2018 04/24/2017 02/27/2014  Decreased Interest 1 1 3 2 1   Down, Depressed, Hopeless 1 1 1 2 2     PHQ - 2 Score 2 2 4 4 3   Altered sleeping 0 0 1 2 0  Tired, decreased energy 1 2 2 3 3   Change in appetite 0 2 2 3  0  Feeling bad or failure about yourself  0 2 1 1  0  Trouble concentrating 0 1 2 1  0  Moving slowly or fidgety/restless 0 0 1 1 0  Suicidal thoughts 0 0 0 0 0  PHQ-9 Score 3 9 13 15 6   Difficult doing work/chores - Somewhat difficult - Somewhat difficult -     ASSESSMENT AND PLAN:  Vitamin D deficiency - Plan: VITAMIN D 25 Hydroxy (Vit-D Deficiency, Fractures)  Type 2 diabetes mellitus without complication, without long-term current use of insulin (HCC)  Other depression - with emotional eating - Plan: topiramate (TOPAMAX) 50 MG tablet  At risk for osteoporosis  Class 3 severe obesity with serious comorbidity and body mass index (BMI) of 40.0 to 44.9 in adult, unspecified obesity type (HCC)  PLAN:  Vitamin D Deficiency Lisa Curry was informed that low vitamin D levels contributes to fatigue and are associated with obesity, breast, and colon cancer. She will continue to take prescription Vit D @50 ,000 IU every week and will follow up for routine testing of vitamin D, at least 2-3 times per year. She was informed of the risk of over-replacement of vitamin D and agrees to not increase her dose unless she discusses this with [place] first. We will check vitamin D level and  Lisa Curry will follow up as directed.  At risk for osteopenia and osteoporosis Lisa Curry was given extended  (15 minutes) osteoporosis prevention counseling today. Lisa Curry is at risk for osteopenia and osteoporosis due to her vitamin D deficiency. She was encouraged to take her vitamin D and follow her higher calcium diet and increase strengthening exercise to help strengthen her bones and decrease her risk of osteopenia and osteoporosis.  Diabetes II Lisa Curry has been given extensive diabetes education by myself today including ideal fasting and post-prandial blood glucose readings, individual ideal Hgb A1c goals and  hypoglycemia prevention. We discussed the importance of good blood sugar control to decrease the likelihood of diabetic complications such as nephropathy, neuropathy, limb loss, blindness, coronary artery disease, and death. We discussed the importance of intensive lifestyle modification including diet, exercise and weight loss as the first line treatment for diabetes. Lisa Curry agrees to continue her diabetes medications and victoza 0.6 mg and will follow up at the agreed upon time.  Depression with Emotional Eating Behaviors We discussed behavior modification techniques today to help Lisa Curry deal with her emotional eating and depression. She has agreed to take Bupropion (Wellbutrin SR) 100 mg qd and Topamax 50 mg by mouth at 4:00 PM #30 with no refills. Lisa Curry agreed to follow up as directed.  Obesity Lisa Curry is currently in the action stage of change. As such, her goal is to continue with weight loss efforts She has agreed to follow the Category 3 plan Lisa Curry has been instructed to work up to a goal of 150 minutes of combined cardio and strengthening exercise per week for weight loss and overall health benefits. We discussed the following Behavioral Modification Strategies today: increase H2O intake, increasing lean protein intake, decreasing simple carbohydrates, increasing vegetables and continue meal planning  Lisa Curry will decrease snacks at night.  Lisa Curry has agreed to follow up with our clinic in 2 weeks. She was informed of the importance of frequent follow up visits to maximize her success with intensive lifestyle modifications for her multiple health conditions.  ALLERGIES: Allergies  Allergen Reactions  . Biaxin [Clarithromycin] Hives  . Metformin And Related Diarrhea    MEDICATIONS: Current Outpatient Medications on File Prior to Visit  Medication Sig Dispense Refill  . acetaminophen-codeine (TYLENOL #3) 300-30 MG tablet Take 1 tablet by mouth.    Marland Kitchen atorvastatin (LIPITOR) 20 MG tablet Take 1  tablet (20 mg total) by mouth daily at 6 PM. 90 tablet 0  . buPROPion (WELLBUTRIN SR) 100 MG 12 hr tablet Take 1 tablet (100 mg total) by mouth 2 (two) times daily. 60 tablet 6  . clonazePAM (KLONOPIN) 0.5 MG tablet TAKE 1 TABLET BY MOUTH DAILY. MAY TAKE 1 ADDITIONAL TABLET DAILY IF NEEDED 60 tablet 1  . cyclobenzaprine (FLEXERIL) 10 MG tablet PLEASE SEE ATTACHED FOR DETAILED DIRECTIONS 30 tablet 1  . dexlansoprazole (DEXILANT) 60 MG capsule TAKE 1 CAPSULE (60 MG TOTAL) BY MOUTH DAILY. 90 capsule 0  . dextromethorphan-guaiFENesin (MUCINEX DM) 30-600 MG 12hr tablet Take 1 tablet by mouth as needed for cough.    . fluticasone (FLONASE) 50 MCG/ACT nasal spray Place into both nostrils daily.    Marland Kitchen glimepiride (AMARYL) 4 MG tablet Take 1 tablet (4 mg total) by mouth 2 (two) times daily. 180 tablet 0  . glucose blood (ONE TOUCH ULTRA TEST) test strip Use to check sugar twice daily and as needed. Dx: E11.65 300 each 3  . ibuprofen (ADVIL,MOTRIN) 600 MG tablet Take 600 mg by mouth daily.    Marland Kitchen  Insulin Pen Needle 31G X 6 MM MISC Use once daily for Victoza SQ 100 each 0  . Lancet Devices (ONE TOUCH DELICA LANCING DEV) MISC Use as directed with lancets and one touch meter E11.65 1 each 0  . liraglutide (VICTOZA) 18 MG/3ML SOPN Inject 0.1 mLs (0.6 mg total) into the skin daily. 9 pen 0  . Multiple Vitamin (MULTIVITAMIN) capsule Take 1 capsule by mouth daily.    Glory Rosebush DELICA LANCETS 73U MISC Check blood sugar twice daily and as directed. Dx E11.65 200 each 0  . pioglitazone (ACTOS) 30 MG tablet Take 30 mg by mouth daily.    Marland Kitchen POTASSIUM PO Take by mouth daily.    . ranitidine (ZANTAC) 150 MG tablet Take 150 mg by mouth at bedtime.    . TRADJENTA 5 MG TABS tablet TAKE 1 TABLET BY MOUTH EVERY DAY 30 tablet 0  . Venlafaxine HCl 225 MG TB24 Take 1 tablet (225 mg total) by mouth daily. 90 tablet 0  . Vitamin D, Ergocalciferol, (DRISDOL) 1.25 MG (50000 UT) CAPS capsule Take 1 capsule (50,000 Units total) by mouth  every 7 (seven) days. 4 capsule 0   Current Facility-Administered Medications on File Prior to Visit  Medication Dose Route Frequency Provider Last Rate Last Dose  . diclofenac sodium (VOLTAREN) 1 % transdermal gel 2 g  2 g Topical QID Jessy Oto, MD        PAST MEDICAL HISTORY: Past Medical History:  Diagnosis Date  . Allergy   . Anemia   . Anxiety   . Barrett esophagus    Don Bulla, have not received EGD report  . Chronic back pain   . Constipation   . Depression with anxiety   . Diabetes mellitus without complication ([place])    DSME 04/2014  . Edema, lower extremity   . Fatty liver   . Gastric polyp 05/2012   h/o EGD  . GERD (gastroesophageal reflux disease)   . History of Helicobacter pylori infection   . HLD (hyperlipidemia)   . Hyperlipidemia   . Vitamin D deficiency     PAST SURGICAL HISTORY: Past Surgical History:  Procedure Laterality Date  . CESAREAN SECTION  6/95, 4/98  . DILATION AND CURETTAGE OF UTERUS  2011  . ESOPHAGOGASTRODUODENOSCOPY  2012?   [facility] L[facility] at [facility]  . ESOPHAGOGASTRODUODENOSCOPY  10/2017   [facility] Ardis Hughs)  . LUMBAR DISC SURGERY  03/1995  . NASAL SINUS SURGERY  03/2001    SOCIAL HISTORY: Social History   Tobacco Use  . Smoking status: Never Smoker  . Smokeless tobacco: Never Used  Substance Use Topics  . Alcohol use: Yes    Alcohol/week: 0.0 standard drinks    Comment: occasional  . Drug use: No    FAMILY HISTORY: Family History  Problem Relation Age of Onset  . Breast cancer Maternal Grandmother 47  . Dementia Maternal Grandmother   . Heart disease Paternal Grandfather   . Other Sister        Postural Orthostatic Tachycardia Syndrome  . Supraventricular tachycardia Sister   . Other Maternal Aunt        hysterectomy for irregular bleed with abnormal pap  . Hyperlipidemia Mother   . Alzheimer's disease Maternal Grandfather     ROS: Review of Systems  Constitutional: Positive for weight loss.   Gastrointestinal: Negative for nausea and vomiting.  Musculoskeletal:       Negative for muscle weakness  Endo/Heme/Allergies:       Negative for  hypoglycemia  Psychiatric/Behavioral: Positive for depression. Negative for suicidal ideas.    PHYSICAL EXAM: Blood pressure 101/66, pulse 80, temperature 97.7 F (36.5 C), temperature source Oral, height 5\' 4"  (1.626 m), weight 260 lb (117.9 kg), SpO2 99 %. Body mass index is 44.63 kg/m. Physical Exam Vitals signs reviewed.  Constitutional:      Appearance: Normal appearance. She is well-developed. She is obese.  Cardiovascular:     Rate and Rhythm: Normal rate.  Pulmonary:     Effort: Pulmonary effort is normal.  Musculoskeletal: Normal range of motion.  Skin:    General: Skin is warm and dry.  Neurological:     Mental Status: She is alert and oriented to person, place, and time.  Psychiatric:        Mood and Affect: Mood normal.        Behavior: Behavior normal.        Thought Content: Thought content does not include homicidal or suicidal ideation.     RECENT LABS AND TESTS: BMET    Component Value Date/Time   NA 140 05/16/2018 1747   K 4.4 07/26/2018 1045   CL 101 05/16/2018 1747   CO2 30 05/16/2018 1747   GLUCOSE 89 05/16/2018 1747   BUN 7 05/16/2018 1747   CREATININE 0.79 05/16/2018 1747   CREATININE 0.68 03/28/2014 1632   CALCIUM 9.1 05/16/2018 1747   GFRNONAA >90 06/21/2014 0309   GFRAA >90 06/21/2014 0309   Lab Results  Component Value Date   HGBA1C 5.6 08/24/2018   HGBA1C 6.4 05/16/2018   HGBA1C 6.3 12/11/2017   HGBA1C 6.3 08/09/2017   HGBA1C 6.8 (H) 04/18/2017   Lab Results  Component Value Date   INSULIN 20.0 07/26/2018   CBC    Component Value Date/Time   WBC 8.9 09/01/2016 1142   RBC 4.39 09/01/2016 1142   HGB 13.2 09/01/2016 1142   HCT 39.3 09/01/2016 1142   PLT 313.0 09/01/2016 1142   MCV 89.6 09/01/2016 1142   MCH 28.0 06/21/2014 0309   MCHC 33.7 09/01/2016 1142   RDW 15.2  09/01/2016 1142   LYMPHSABS 2.4 09/01/2016 1142   MONOABS 0.2 09/01/2016 1142   EOSABS 0.2 09/01/2016 1142   BASOSABS 0.0 09/01/2016 1142   Iron/TIBC/Ferritin/ %Sat    Component Value Date/Time   IRON 62 05/30/2016 1307   FERRITIN 55.2 05/30/2016 1307   IRONPCTSAT 16.4 (L) 05/30/2016 1307   Lipid Panel     Component Value Date/Time   CHOL 127 05/16/2018 1747   CHOL 123 10/30/2012   TRIG 124.0 05/16/2018 1747   TRIG 124 10/30/2012   HDL 50.10 05/16/2018 1747   CHOLHDL 3 05/16/2018 1747   VLDL 24.8 05/16/2018 1747   LDLCALC 52 05/16/2018 1747   LDLCALC 59 10/30/2012   LDLDIRECT 73.0 04/18/2017 0847   Hepatic Function Panel     Component Value Date/Time   PROT 7.1 05/16/2018 1747   ALBUMIN 4.1 05/16/2018 1747   AST 19 05/16/2018 1747   ALT 16 05/16/2018 1747   ALKPHOS 83 05/16/2018 1747   BILITOT 0.5 05/16/2018 1747      Component Value Date/Time   TSH 2.42 05/16/2018 1747   TSH 1.80 04/07/2016 1632   TSH 2.351 03/28/2014 1632   Results for MAECY, PODGURSKI (MRN [id number]) as of 09/25/2018 14:12  Ref. Range 05/16/2018 17:47  VITD Latest Ref Range: 30.00 - 100.00 ng/mL 36.49     OBESITY BEHAVIORAL INTERVENTION VISIT  Today's visit was # 4   Starting weight:  279 lbs Starting date: 07/26/2018 Today's weight : 260 lbs Today's date: 09/25/2018 Total lbs lost to date: 33   ASK: We discussed the diagnosis of obesity with Lisa Curry today and Lisa Curry agreed to give [place] permission to discuss obesity behavioral modification therapy today.  ASSESS: Lisa Curry has the diagnosis of obesity and her BMI today is 44.61 Lisa Curry is in the action stage of change   ADVISE: Lisa Curry was educated on the multiple health risks of obesity as well as the benefit of weight loss to improve her health. She was advised of the need for long term treatment and the importance of lifestyle modifications to improve her current health and to decrease her risk of future health problems.  AGREE: Multiple  dietary modification options and treatment options were discussed and  Lisa Curry agreed to follow the recommendations documented in the above note.  ARRANGE: Lisa Curry was educated on the importance of frequent visits to treat obesity as outlined per CMS and USPSTF guidelines and agreed to schedule her next follow up appointment today.  Corey Skains, am acting as Location manager for [employer]. Owens Shark, DO  I have reviewed the above documentation for accuracy and completeness, and I agree with the above. -Jearld Lesch, DO

## 2018-09-25 NOTE — Progress Notes (Signed)
Subjective:    Patient ID: Lisa Curry is a 48 y.o. female.  HPI     Star Age, MD, PhD [facility] [address] [place], [place] [address]  Dear Dr. Owens Shark,  I saw your patient, Lisa Curry, upon your kind request in my sleep clinic today for initial consultation of her sleep disorder, in particular, concern for underlying obstructive sleep apnea. The patient is unaccompanied today. As you know, Ms. Cheetham is a 48 year old right-handed woman with an underlying medical history of diabetes, hyperlipidemia, history of fatty liver, vitamin D deficiency, and morbid obesity with BMI of over 45, who reports snoring and excessive daytime somnolence. I reviewed her office note from 08/15/2018. She also recently saw Dr. Jaynee Eagles in our office for lumbar radiculopathy. Her Epworth sleepiness score is 11 out of 24, fatigue score is 31 out of 63. She is married and lives with her husband, they have 2 adult children. She is a nonsmoker and drinks alcohol occasionally, caffeine in the form of coffee, usually one cup per day and soda, about 20 ounces per day on average. She has been to weight management clinic for the past 2 months and has started to lose some weight. She takes Tylenol with Codeine for back pain. She works part-time as a Oceanographer. When she has to work she gets up around 5 or 5:30. She does not typically wake up rested, bedtime is usually around 11:30 PM. They have 1 dog and 2 cats in the household and the daughter typically sleeps on the bed with them. She sometimes wakes up due to the dog disturbing her or due to back pain and sometimes she sleeps in the recliner. She has nocturia about once per average night and has had the occasional morning headache. She had one paternal uncle with sleep apnea. She is a side sleeper typically.  Her Past Medical History Is Significant For: Past Medical History:  Diagnosis Date  . Allergy   . Anemia   .  Anxiety   . Barrett esophagus    Don Bulla, have not received EGD report  . Chronic back pain   . Constipation   . Depression with anxiety   . Diabetes mellitus without complication ([place])    DSME 04/2014  . Edema, lower extremity   . Fatty liver   . Gastric polyp 05/2012   h/o EGD  . GERD (gastroesophageal reflux disease)   . History of Helicobacter pylori infection   . HLD (hyperlipidemia)   . Hyperlipidemia   . Vitamin D deficiency     Her Past Surgical History Is Significant For: Past Surgical History:  Procedure Laterality Date  . CESAREAN SECTION  6/95, 4/98  . DILATION AND CURETTAGE OF UTERUS  2011  . ESOPHAGOGASTRODUODENOSCOPY  2012?   Ferdinand Lango at [facility]  . ESOPHAGOGASTRODUODENOSCOPY  10/2017   [facility])  . LUMBAR DISC SURGERY  03/1995  . NASAL SINUS SURGERY  03/2001    Her Family History Is Significant For: Family History  Problem Relation Age of Onset  . Breast cancer Maternal Grandmother 71  . Dementia Maternal Grandmother   . Heart disease Paternal Grandfather   . Other Sister        Postural Orthostatic Tachycardia Syndrome  . Supraventricular tachycardia Sister   . Other Maternal Aunt        hysterectomy for irregular bleed with abnormal pap  . Hyperlipidemia Mother   . Alzheimer's disease Maternal Grandfather  Her Social History Is Significant For: Social History   Socioeconomic History  . Marital status: Married    Spouse name: W. Eyvonne Burchfield  . Number of children: 2  . Years of education: Not on file  . Highest education level: Bachelor's degree (e.g., BA, AB, BS)  Occupational History  . Occupation: Pharmacist, hospital, care giver  Social Needs  . Financial resource strain: Not on file  . Food insecurity:    Worry: Not on file    Inability: Not on file  . Transportation needs:    Medical: Not on file    Non-medical: Not on file  Tobacco Use  . Smoking status: Never Smoker  . Smokeless tobacco: Never Used  Substance and Sexual  Activity  . Alcohol use: Yes    Alcohol/week: 0.0 standard drinks    Comment: occasional  . Drug use: No  . Sexual activity: Not on file  Lifestyle  . Physical activity:    Days per week: Not on file    Minutes per session: Not on file  . Stress: Not on file  Relationships  . Social connections:    Talks on phone: Not on file    Gets together: Not on file    Attends religious service: Not on file    Active member of club or organization: Not on file    Attends meetings of clubs or organizations: Not on file    Relationship status: Not on file  Other Topics Concern  . Not on file  Social History Narrative   Lives with husband and 2 children, 2 dogs, 2 cats and 4 ducks and 16 chickens and a fish   Occ: special ed Oceanographer.   Activity: no regular exercise   Diet: good water, fruits/vegetables daily   Right handed   Caffeine: daily    Her Allergies Are:  Allergies  Allergen Reactions  . Biaxin [Clarithromycin] Hives  . Metformin And Related Diarrhea  :   Her Current Medications Are:  Outpatient Encounter Medications as of 09/25/2018  Medication Sig  . acetaminophen-codeine (TYLENOL #3) 300-30 MG tablet Take 1 tablet by mouth.  Marland Kitchen atorvastatin (LIPITOR) 20 MG tablet Take 1 tablet (20 mg total) by mouth daily at 6 PM.  . buPROPion (WELLBUTRIN SR) 150 MG 12 hr tablet Take 1 tablet (150 mg total) by mouth 2 (two) times daily. Take once daily in am for first 4 days  . clonazePAM (KLONOPIN) 0.5 MG tablet TAKE 1 TABLET BY MOUTH DAILY. MAY TAKE 1 ADDITIONAL TABLET DAILY IF NEEDED  . cyclobenzaprine (FLEXERIL) 10 MG tablet PLEASE SEE ATTACHED FOR DETAILED DIRECTIONS  . dexlansoprazole (DEXILANT) 60 MG capsule TAKE 1 CAPSULE (60 MG TOTAL) BY MOUTH DAILY.  Marland Kitchen dextromethorphan-guaiFENesin (MUCINEX DM) 30-600 MG 12hr tablet Take 1 tablet by mouth as needed for cough.  . fluticasone (FLONASE) 50 MCG/ACT nasal spray Place into both nostrils daily.  Marland Kitchen glimepiride (AMARYL) 4 MG  tablet Take 1 tablet (4 mg total) by mouth 2 (two) times daily.  Marland Kitchen glucose blood (ONE TOUCH ULTRA TEST) test strip Use to check sugar twice daily and as needed. Dx: E11.65  . ibuprofen (ADVIL,MOTRIN) 600 MG tablet Take 600 mg by mouth daily.  . Insulin Pen Needle 31G X 6 MM MISC Use once daily for Victoza SQ  . Lancet Devices (ONE TOUCH DELICA LANCING DEV) MISC Use as directed with lancets and one touch meter E11.65  . liraglutide (VICTOZA) 18 MG/3ML SOPN Inject 0.1 mLs (0.6 mg total) into  the skin daily.  . Multiple Vitamin (MULTIVITAMIN) capsule Take 1 capsule by mouth daily.  Glory Rosebush DELICA LANCETS 68T MISC Check blood sugar twice daily and as directed. Dx E11.65  . pioglitazone (ACTOS) 30 MG tablet Take 30 mg by mouth daily.  Marland Kitchen POTASSIUM PO Take by mouth daily.  . ranitidine (ZANTAC) 150 MG tablet Take 150 mg by mouth at bedtime.  . TRADJENTA 5 MG TABS tablet TAKE 1 TABLET BY MOUTH EVERY DAY  . Venlafaxine HCl 225 MG TB24 Take 1 tablet (225 mg total) by mouth daily.  . Vitamin D, Ergocalciferol, (DRISDOL) 1.25 MG (50000 UT) CAPS capsule Take 1 capsule (50,000 Units total) by mouth every 7 (seven) days.   Facility-Administered Encounter Medications as of 09/25/2018  Medication  . diclofenac sodium (VOLTAREN) 1 % transdermal gel 2 g  :  Review of Systems:  Out of a complete 14 point review of systems, all are reviewed and negative with the exception of these symptoms as listed below: Review of Systems  Neurological:       Pt presents today to discuss her sleep. Pt has never had a sleep study but does endorse snoring.  Epworth Sleepiness Scale 0= would never doze 1= slight chance of dozing 2= moderate chance of dozing 3= high chance of dozing  Sitting and reading: 1 Watching TV: 2 Sitting inactive in a public place (ex. Theater or meeting): 2 As a passenger in a car for an hour without a break: 2 Lying down to rest in the afternoon: 3 Sitting and talking to someone: 0 Sitting  quietly after lunch (no alcohol): 1 In a car, while stopped in traffic: 0 Total: 11     Objective:  Neurological Exam  Physical Exam Physical Examination:   Vitals:   09/25/18 0900  BP: 136/79  Pulse: 84    General Examination: The patient is a very pleasant 48 y.o. female in no acute distress. She appears well-developed and well-nourished and well groomed.   HEENT: Normocephalic, atraumatic, pupils are equal, round and reactive to light and accommodation. Corrective eye glasses in place. Extraocular tracking is good without limitation to gaze excursion or nystagmus noted. Normal smooth pursuit is noted. Hearing is grossly intact. Face is symmetric with normal facial animation and normal facial sensation. Speech is clear with no dysarthria noted. There is no hypophonia. There is no lip, neck/head, jaw or voice tremor. Neck is supple with full range of passive and active motion. There are no carotid bruits on auscultation. Oropharynx exam reveals: mild mouth dryness, adequate dental hygiene and mild airway crowding, due to smaller airway entry, tonsils are 1-2+, Mallampati is class II. Neck circumference is 17-1/2 inches. She has a mild overbite. Tongue protrudes centrally and palate elevates symmetrically.  Chest: Clear to auscultation without wheezing, rhonchi or crackles noted.  Heart: S1+S2+0, regular and normal without murmurs, rubs or gallops noted.   Abdomen: Soft, non-tender and non-distended with normal bowel sounds appreciated on auscultation.  Extremities: There is no pitting edema in the distal lower extremities bilaterally.  Skin: Warm and dry without trophic changes noted.  Musculoskeletal: exam reveals no obvious joint deformities, tenderness or joint swelling or erythema.   Neurologically:  Mental status: The patient is awake, alert and oriented in all 4 spheres. Her immediate and remote memory, attention, language skills and fund of knowledge are appropriate. There  is no evidence of aphasia, agnosia, apraxia or anomia. Speech is clear with normal prosody and enunciation. Thought process is linear. Mood  is normal and affect is normal.  Cranial nerves II - XII are as described above under HEENT exam. In addition: shoulder shrug is normal with equal shoulder height noted. Motor exam: Normal bulk, strength and tone is noted. There is no drift, tremor or rebound. Romberg is negative. Reflexes are 2+ throughout. Fine motor skills and coordination: grossly intact.  Cerebellar testing: No dysmetria or intention tremor on finger to nose testing. Heel to shin is unremarkable bilaterally. There is no truncal or gait ataxia.  Sensory exam: intact to light touch in the upper and lower extremities.  Gait, station and balance: She stands easily. No veering to one side is noted. No leaning to one side is noted. Posture is age-appropriate and stance is narrow based. Gait shows normal stride length and normal pace. No problems turning are noted. Tandem walk is unremarkable.       Assessment and Plan:  In summary, LIDDIE CHICHESTER is a very pleasant 48 y.o.-year old female  with an underlying medical history of diabetes, hyperlipidemia, history of fatty liver, vitamin D deficiency, and morbid obesity with BMI of over 23, whose history and physical exam are concerning for obstructive sleep apnea (OSA). I had a long chat with the patient about my findings and the diagnosis of OSA, its prognosis and treatment options. We talked about medical treatments, surgical interventions and non-pharmacological approaches. I explained in particular the risks and ramifications of untreated moderate to severe OSA, especially with respect to developing cardiovascular disease down the Road, including congestive heart failure, difficult to treat hypertension, cardiac arrhythmias, or stroke. Even type 2 diabetes has, in part, been linked to untreated OSA. Symptoms of untreated OSA include daytime sleepiness,  memory problems, mood irritability and mood disorder such as depression and anxiety, lack of energy, as well as recurrent headaches, especially morning headaches. We talked about trying to maintain a healthy lifestyle in general, as well as the importance of weight control. I encouraged the patient to eat healthy, exercise daily and keep well hydrated, to keep a scheduled bedtime and wake time routine, to not skip any meals and eat healthy snacks in between meals. I advised the patient not to drive when feeling sleepy. I recommended the following at this time: sleep study with potential positive airway pressure titration. (We will score hypopneas at 3%).   I explained the sleep test procedure to the patient and also outlined possible surgical and non-surgical treatment options of OSA, including the use of a custom-made dental device (which would require a referral to a specialist dentist or oral surgeon), upper airway surgical options, such as pillar implants, radiofrequency surgery, tongue base surgery, and UPPP (which would involve a referral to an ENT surgeon). Rarely, jaw surgery such as mandibular advancement may be considered.  I also explained the CPAP treatment option to the patient, who indicated that she would be willing to try CPAP if the need arises. I explained the importance of being compliant with PAP treatment, not only for insurance purposes but primarily to improve Her symptoms, and for the patient's long term health benefit, including to reduce Her cardiovascular risks. I answered all her questions today and the patient was in agreement. I plan to see her back after the sleep study is completed and encouraged her to call with any interim questions, concerns, problems or updates.   Thank you very much for allowing me to participate in the care of this nice patient. If I can be of any further assistance to you  please do not hesitate to call me at [phone].  Sincerely,   Star Age,  MD, PhD

## 2018-09-26 ENCOUNTER — Other Ambulatory Visit: Payer: Self-pay | Admitting: Family Medicine

## 2018-09-26 LAB — VITAMIN D 25 HYDROXY (VIT D DEFICIENCY, FRACTURES): Vit D, 25-Hydroxy: 48.9 ng/mL (ref 30.0–100.0)

## 2018-09-26 NOTE — Telephone Encounter (Signed)
Name of Medication: Clonazepam  Name of Pharmacy: [facility] Last Fill or Written Date and Quantity: 09/04/18, #60/1 Last Office Visit and Type: 08/24/18, acute & f/u Next Office Visit and Type: none Last Controlled Substance Agreement Date: 04/24/17 Last UDS: 10/16/14  Venlafaxine last filled 07/27/18, #90/0

## 2018-09-28 ENCOUNTER — Other Ambulatory Visit: Payer: Self-pay | Admitting: Family Medicine

## 2018-09-28 MED ORDER — CLONAZEPAM 0.5 MG PO TABS: 0.5000 mg | ORAL_TABLET | Freq: Two times a day (BID) | ORAL | 0 refills | Status: DC | PRN

## 2018-09-28 NOTE — Addendum Note (Signed)
Addended by: Ria Bush on: 09/28/2018 05:03 PM   Modules accepted: Orders

## 2018-09-28 NOTE — Telephone Encounter (Signed)
Phoned in due to E prescribing error

## 2018-09-28 NOTE — Telephone Encounter (Signed)
Eprescribed.

## 2018-10-01 ENCOUNTER — Encounter (INDEPENDENT_AMBULATORY_CARE_PROVIDER_SITE_OTHER): Payer: Self-pay | Admitting: Bariatrics

## 2018-10-01 ENCOUNTER — Other Ambulatory Visit (INDEPENDENT_AMBULATORY_CARE_PROVIDER_SITE_OTHER): Payer: Self-pay | Admitting: Bariatrics

## 2018-10-01 DIAGNOSIS — E559 Vitamin D deficiency, unspecified: Secondary | ICD-10-CM

## 2018-10-01 MED ORDER — VITAMIN D (ERGOCALCIFEROL) 1.25 MG (50000 UNIT) PO CAPS: 50000.0000 [IU] | ORAL_CAPSULE | ORAL | 0 refills | Status: DC

## 2018-10-03 ENCOUNTER — Other Ambulatory Visit (INDEPENDENT_AMBULATORY_CARE_PROVIDER_SITE_OTHER): Payer: Self-pay | Admitting: Bariatrics

## 2018-10-03 DIAGNOSIS — E559 Vitamin D deficiency, unspecified: Secondary | ICD-10-CM

## 2018-10-03 NOTE — Progress Notes (Signed)
Office: [phone]  /  Fax: [phone]    Date: October 08, 2018   Time Seen: 9:30am Duration: 36 minutes Provider: Glennie Isle, Psy.D. Type of Session: Individual Therapy  Type of Contact: Face-to-face  HPI: Lisa Curry was referred by Dr. Jearld Lesch due to depression with emotional eating behaviors, and was seen for an initial appointment with this provider on September 20, 2018. Per the note for the visit with Dr. Jearld Lesch on August 15, 2018, "[facility] struggling with emotional eating and using food for comfort to the extent that it is negatively impacting herhealth. Sheoften snacks when sheis not hungry. Marysometimes feels sheis out of control and then feels guilty that shemade poor food choices. Nunzio Cory been working on behavior modification techniques to help reduce heremotional eating and has been somewhat successful.She was referred to Dr. Mallie Mussel, with no appointment.Sheshows no sign of suicidal or homicidal ideations." In addition, per the note for the initial visit with Dr. Jearld Lesch on July 26, 2018, [name] has been heavy most of her life, and she started gaining weight after pregnancies. Herheaviest weight ever was 285 pounds. During the initial appointment with Dr. Jearld Lesch, [name] reported experiencing the following: cravings chocolate and salty carbohydrates (late afternoon to bedtime); snacking frequently in the evenings; sometimes skipping breakfast and lunch; hiding foods occasionally; frequently drinking liquids with calories; frequently eating larger portions than normal; and struggling with emotional eating.  During the initial appointment with this provider, [name] reported she eats "bigger portions" than intended when she is stressed and indicated she experiences difficulty with portion control. She shared the last episode of emotional eating was a "few months ago." [name] shared she tends to crave carbohydrates and described them as being a "big comfort food." She  also indicated she tends to crave sweets. Shanikwa further discussed a history of mindless eating. Furthermore, [name] shared she is often called "a failure" by her husband and daughter when she deviates from her prescribed meal plan. Moreover, [name] indicated she has engaged in emotional eating "all" her life; however, it was exacerbated after she got married. She denied a history of binge eating. [name] also denied a history of purging and engagement other compensatory strategies. She has never been diagnosed with a eating disorder. Furthermore, [name] was asked to complete a questionnaire assessing various behaviors related to emotional eating. [name] endorsed the following: overeat when you are celebrating, eat certain foods when you are anxious, stressed, depressed, or your feelings are hurt, use food to help you cope with emotional situations, overeat when you are angry or upset and eat as a reward.  During today's appointment, [name] reported she lost 5 pounds since her last appointment, but also noted instances of emotional eating. She also shared she was sick.   Session Content: Session focused on the following treatment goal: decrease emotional eating. The session was initiated with the administration of the PHQ-9 and GAD-7, as well as a brief check-in. [name] shared she has been sick, but she noted she lost five pounds. Prior to being sick, she indicated "most" of her eating has been secondary to physical hunger. However, she described instances of experiencing emotional hunger and subsequent emotional eating especially when "tired." As such, this provider and [name] discussed portion control as it relates to utilizing allocated snack calories during instances of emotional eating. Psychoeducation regarding triggers for emotional eating was provided. [name] was provided a handout, and encouraged to utilize the handout between now and the next appointment to increase awareness of triggers and  frequency. [facility] agreed. This  provider also discussed behavioral strategies for specific triggers, such as placing the utensil down when conversing to avoid mindless eating. Regarding initiating therapeutic services for ongoing stressors, [name] indicated she has an appointment on January 29th with [facility]. She expressed desire to continue working with this provider to address emotional eating as therapy at [facility] will address ongoing stressors. [name] was receptive to today's session as evidenced by openness to sharing, responsiveness to feedback, and willingness to identify triggers for emotional eating.  Mental Status Examination: [name] arrived on time for the appointment. She presented as appropriately dressed and groomed. [name] appeared her stated age and demonstrated adequate orientation to time, place, person, and purpose of the appointment. She also demonstrated appropriate eye contact. No psychomotor abnormalities or behavioral peculiarities noted. Her mood was euthymic with congruent affect. Her thought processes were logical, linear, and goal-directed. No hallucinations, delusions, bizarre thinking or behavior reported or observed. Judgment, insight, and impulse control appeared to be grossly intact. There was no evidence of paraphasias (i.e., errors in speech, gross mispronunciations, and word substitutions), repetition deficits, or disturbances in volume or prosody (i.e., rhythm and intonation). There was no evidence of attention or memory impairments. Jett denied current suicidal and homicidal ideation, intent or plan.  Structured Assessment Results: The Patient Health Questionnaire-9 (PHQ-9) is a self-report measure that assesses symptoms and severity of depression over the course of the last two weeks. [name] obtained a score of 10 suggesting moderate depression. [name] finds the endorsed symptoms to be somewhat difficult. Depression screen PHQ 2/9 10/08/2018  Decreased Interest 2  Down,  Depressed, Hopeless 1  PHQ - 2 Score 3  Altered sleeping 0  Tired, decreased energy 3  Change in appetite 2  Feeling bad or failure about yourself  1  Trouble concentrating 1  Moving slowly or fidgety/restless 0  Suicidal thoughts 0  PHQ-9 Score 10  Difficult doing work/chores -   The Generalized Anxiety Disorder-7 (GAD-7) is a brief self-report measure that assesses symptoms of anxiety over the course of the last two weeks. Meesha obtained a score of 2 suggesting minimal anxiety. GAD 7 : Generalized Anxiety Score 10/08/2018  Nervous, Anxious, on Edge 0  Control/stop worrying 0  Worry too much - different things 0  Trouble relaxing 1  Restless 0  Easily annoyed or irritable 1  Afraid - awful might happen 0  Total GAD 7 Score 2  Anxiety Difficulty Not difficult at all   Interventions:  Administration of PHQ-9 and GAD-7 for symptom monitoring Review of content from the previous session Empathic reflections and validation Psychoeducation regarding triggers for emotional eating Rapport building Brief chart review  DSM-5 Diagnosis: 311 (F32.8) Other Specified Depressive Disorder, Emotional Eating Behaviors  Treatment Goal & Progress: During the initial appointment with this provider, the following treatment goal was established: decrease emotional eating. Zailyn has demonstrated progress in her goal as evidenced by increased awareness of hunger patterns. During today's appointment, [name] expressed willingness to identify her triggers for emotional eating.  Plan: Jericka continues to appear able and willing to participate as evidenced by engagement in reciprocal conversation, and asking questions for clarification as appropriate. The next appointment will be scheduled in two weeks. The next session will focus on reviewing triggers for emotional eating and the introduction of thought defusion.

## 2018-10-08 ENCOUNTER — Encounter (INDEPENDENT_AMBULATORY_CARE_PROVIDER_SITE_OTHER): Payer: Self-pay | Admitting: Bariatrics

## 2018-10-08 ENCOUNTER — Ambulatory Visit (INDEPENDENT_AMBULATORY_CARE_PROVIDER_SITE_OTHER): Payer: BLUE CROSS/BLUE SHIELD | Admitting: Psychology

## 2018-10-08 ENCOUNTER — Ambulatory Visit (INDEPENDENT_AMBULATORY_CARE_PROVIDER_SITE_OTHER): Payer: BLUE CROSS/BLUE SHIELD | Admitting: Bariatrics

## 2018-10-08 VITALS — BP 114/70 | HR 79 | Temp 98.1°F | Ht 64.0 in | Wt 255.0 lb

## 2018-10-08 DIAGNOSIS — E559 Vitamin D deficiency, unspecified: Secondary | ICD-10-CM

## 2018-10-08 DIAGNOSIS — Z6841 Body Mass Index (BMI) 40.0 and over, adult: Secondary | ICD-10-CM

## 2018-10-08 DIAGNOSIS — F3289 Other specified depressive episodes: Secondary | ICD-10-CM | POA: Diagnosis not present

## 2018-10-08 DIAGNOSIS — Z9189 Other specified personal risk factors, not elsewhere classified: Secondary | ICD-10-CM

## 2018-10-08 DIAGNOSIS — E119 Type 2 diabetes mellitus without complications: Secondary | ICD-10-CM | POA: Diagnosis not present

## 2018-10-09 LAB — COMPREHENSIVE METABOLIC PANEL
ALT: 12 IU/L (ref 0–32)
AST: 12 IU/L (ref 0–40)
Albumin/Globulin Ratio: 1.8 (ref 1.2–2.2)
Albumin: 4.2 g/dL (ref 3.8–4.8)
Alkaline Phosphatase: 95 IU/L (ref 39–117)
BUN/Creatinine Ratio: 9 (ref 9–23)
BUN: 8 mg/dL (ref 6–24)
Bilirubin Total: 0.2 mg/dL (ref 0.0–1.2)
CO2: 24 mmol/L (ref 20–29)
Calcium: 8.8 mg/dL (ref 8.7–10.2)
Chloride: 102 mmol/L (ref 96–106)
Creatinine, Ser: 0.86 mg/dL (ref 0.57–1.00)
GFR calc Af Amer: 92 mL/min/{1.73_m2} (ref >59)
GFR calc non Af Amer: 80 mL/min/{1.73_m2} (ref >59)
Globulin, Total: 2.3 g/dL (ref 1.5–4.5)
Glucose: 102 mg/dL — ABNORMAL HIGH (ref 65–99)
Potassium: 3.9 mmol/L (ref 3.5–5.2)
Sodium: 142 mmol/L (ref 134–144)
Total Protein: 6.5 g/dL (ref 6.0–8.5)

## 2018-10-09 NOTE — Progress Notes (Signed)
Office: [phone]  /  Fax: [phone]   HPI:   Chief Complaint: OBESITY Lisa Curry is here to discuss her progress with her obesity treatment plan. She is on the Category 3 plan and is following her eating plan approximately 45 % of the time. She states she is exercising 0 minutes 0 times per week. Lisa Curry is doing well. She is drinking water well. Her weight is 255 lb (115.7 kg) today and has had a weight loss of 5 pounds over a period of 3 weeks since her last visit. She has lost 24 lbs since starting treatment with [place].  Diabetes II Lisa Curry has a diagnosis of diabetes type II. Lisa Curry states fasting BGs range between 120 and 150. She is on Amaryl and Tradjenta. Her lowest blood sugar was 73. Last A1c was at 5.6 She has been working on intensive lifestyle modifications including diet, exercise, and weight loss to help control her blood glucose levels.  Vitamin D deficiency Lisa Curry has a diagnosis of vitamin D deficiency. She is currently taking vit D and denies nausea, vomiting or muscle weakness.  At risk for osteopenia and osteoporosis Lisa Curry is at higher risk of osteopenia and osteoporosis due to vitamin D deficiency.   Depression with emotional eating behaviors Lisa Curry is seeing Dr. Mallie Mussel. She is currently taking Wellbutrin and Klonopin and she is taking Topamax intermittently. Lisa Curry struggles with emotional eating and using food for comfort to the extent that it is negatively impacting her health. She often snacks when she is not hungry. Lisa Curry sometimes feels she is out of control and then feels guilty that she made poor food choices. She has been working on behavior modification techniques to help reduce her emotional eating and has been somewhat successful. She shows no sign of suicidal or homicidal ideations.  Depression screen [facility] 2/9 10/08/2018 09/20/2018 07/26/2018 05/16/2018 04/24/2017  Decreased Interest 2 1 1 3 2   Down, Depressed, Hopeless 1 1 1 1 2   PHQ - 2 Score 3 2 2 4 4   Altered sleeping 0 0 0  1 2  Tired, decreased energy 3 1 2 2 3   Change in appetite 2 0 2 2 3   Feeling bad or failure about yourself  1 0 2 1 1   Trouble concentrating 1 0 1 2 1   Moving slowly or fidgety/restless 0 0 0 1 1  Suicidal thoughts 0 0 0 0 0  PHQ-9 Score 10 3 9 13 15   Difficult doing work/chores - - Somewhat difficult - Somewhat difficult    ASSESSMENT AND PLAN:  Type 2 diabetes mellitus without complication, without long-term current use of insulin (HCC) - Plan: Comprehensive metabolic panel  Vitamin D deficiency  Other depression - with emotional eating  At risk for osteoporosis  Class 3 severe obesity with serious comorbidity and body mass index (BMI) of 40.0 to 44.9 in adult, unspecified obesity type (HCC)  PLAN:  Diabetes II Lisa Curry has been given extensive diabetes education by myself today including ideal fasting and post-prandial blood glucose readings, individual ideal Hgb A1c goals and hypoglycemia prevention. We discussed the importance of good blood sugar control to decrease the likelihood of diabetic complications such as nephropathy, neuropathy, limb loss, blindness, coronary artery disease, and death. We discussed the importance of intensive lifestyle modification including diet, exercise and weight loss as the first line treatment for diabetes. Lisa Curry agrees to continue her diabetes medications and will follow up at the agreed upon time.  Vitamin D Deficiency Lisa Curry was informed that low vitamin D  levels contributes to fatigue and are associated with obesity, breast, and colon cancer. She agrees to continue to take prescription Vit D @50 ,000 IU every 14 days and will follow up for routine testing of vitamin D, at least 2-3 times per year. She was informed of the risk of over-replacement of vitamin D and agrees to not increase her dose unless she discusses this with [place] first.  At risk for osteopenia and osteoporosis Lisa Curry was given extended  (15 minutes) osteoporosis prevention counseling  today. Lisa Curry is at risk for osteopenia and osteoporosis due to her vitamin D deficiency. She was encouraged to take her vitamin D and follow her higher calcium diet and increase strengthening exercise to help strengthen her bones and decrease her risk of osteopenia and osteoporosis.  Depression with Emotional Eating Behaviors We discussed behavior modification techniques today to help Lisa Curry deal with her emotional eating and depression. She will continue to see Dr. Mallie Mussel and she will take Topamax in the afternoon. [name] agreed to follow up as directed.  Obesity Lisa Curry is currently in the action stage of change. As such, her goal is to continue with weight loss efforts She has agreed to follow the Category 3 plan Lisa Curry has been instructed to work up to a goal of 150 minutes of combined cardio and strengthening exercise per week for weight loss and overall health benefits. We discussed the following Behavioral Modification Strategies today: increase H2O intake, keeping healthy foods in the home, increasing lean protein intake, decreasing simple carbohydrates, increasing vegetables, work on meal planning and easy cooking plans and decrease snacking   Lisa Curry has agreed to follow up with our clinic in 2 weeks. She was informed of the importance of frequent follow up visits to maximize her success with intensive lifestyle modifications for her multiple health conditions.  ALLERGIES: Allergies  Allergen Reactions  . Biaxin [Clarithromycin] Hives  . Metformin And Related Diarrhea    MEDICATIONS: Current Outpatient Medications on File Prior to Visit  Medication Sig Dispense Refill  . acetaminophen-codeine (TYLENOL #3) 300-30 MG tablet Take 1 tablet by mouth.    Marland Kitchen atorvastatin (LIPITOR) 20 MG tablet TAKE 1 TABLET (20 MG TOTAL) BY MOUTH DAILY AT 6 PM. 90 tablet 2  . buPROPion (WELLBUTRIN SR) 100 MG 12 hr tablet Take 1 tablet (100 mg total) by mouth 2 (two) times daily. 180 tablet 1  . clonazePAM (KLONOPIN)  0.5 MG tablet Take 1 tablet (0.5 mg total) by mouth 2 (two) times daily as needed for anxiety. 60 tablet 0  . cyclobenzaprine (FLEXERIL) 10 MG tablet PLEASE SEE ATTACHED FOR DETAILED DIRECTIONS 30 tablet 1  . dexlansoprazole (DEXILANT) 60 MG capsule TAKE 1 CAPSULE (60 MG TOTAL) BY MOUTH DAILY. 90 capsule 0  . dextromethorphan-guaiFENesin (MUCINEX DM) 30-600 MG 12hr tablet Take 1 tablet by mouth as needed for cough.    . fluticasone (FLONASE) 50 MCG/ACT nasal spray Place into both nostrils daily.    Marland Kitchen glimepiride (AMARYL) 4 MG tablet Take 1 tablet (4 mg total) by mouth 2 (two) times daily. 180 tablet 0  . glucose blood (ONE TOUCH ULTRA TEST) test strip Use to check sugar twice daily and as needed. Dx: E11.65 300 each 3  . ibuprofen (ADVIL,MOTRIN) 600 MG tablet Take 600 mg by mouth daily.    . Insulin Pen Needle 31G X 6 MM MISC Use once daily for Victoza SQ 100 each 0  . Lancet Devices (ONE TOUCH DELICA LANCING DEV) MISC Use as directed with lancets and one  touch meter E11.65 1 each 0  . liraglutide (VICTOZA) 18 MG/3ML SOPN Inject 0.1 mLs (0.6 mg total) into the skin daily. 9 pen 0  . meclizine (ANTIVERT) 12.5 MG tablet Take 12.5 mg by mouth 3 (three) times daily as needed for dizziness.    . Multiple Vitamin (MULTIVITAMIN) capsule Take 1 capsule by mouth daily.    Glory Rosebush DELICA LANCETS 16X MISC Check blood sugar twice daily and as directed. Dx E11.65 200 each 0  . pioglitazone (ACTOS) 30 MG tablet Take 30 mg by mouth daily.    Marland Kitchen POTASSIUM PO Take by mouth daily.    . ranitidine (ZANTAC) 150 MG tablet Take 150 mg by mouth at bedtime.    . topiramate (TOPAMAX) 50 MG tablet Take 1 tablet (50 mg total) by mouth daily. 30 tablet 0  . TRADJENTA 5 MG TABS tablet TAKE 1 TABLET BY MOUTH EVERY DAY 30 tablet 0  . Venlafaxine HCl 225 MG TB24 TAKE 1 TABLET (225 MG TOTAL) BY MOUTH DAILY. 90 tablet 1  . Vitamin D, Ergocalciferol, (DRISDOL) 1.25 MG (50000 UT) CAPS capsule Take 1 capsule (50,000 Units total)  by mouth every 14 (fourteen) days. 2 capsule 0   Current Facility-Administered Medications on File Prior to Visit  Medication Dose Route Frequency Provider Last Rate Last Dose  . diclofenac sodium (VOLTAREN) 1 % transdermal gel 2 g  2 g Topical QID Jessy Oto, MD        PAST MEDICAL HISTORY: Past Medical History:  Diagnosis Date  . Allergy   . Anemia   . Anxiety   . Barrett esophagus    Lisa Curry, have not received EGD report  . Chronic back pain   . Constipation   . Depression with anxiety   . Diabetes mellitus without complication ([place])    DSME 04/2014  . Edema, lower extremity   . Fatty liver   . Gastric polyp 05/2012   h/o EGD  . GERD (gastroesophageal reflux disease)   . History of Helicobacter pylori infection   . HLD (hyperlipidemia)   . Hyperlipidemia   . Vitamin D deficiency     PAST SURGICAL HISTORY: Past Surgical History:  Procedure Laterality Date  . CESAREAN SECTION  6/95, 4/98  . DILATION AND CURETTAGE OF UTERUS  2011  . ESOPHAGOGASTRODUODENOSCOPY  2012?   [facility]  . ESOPHAGOGASTRODUODENOSCOPY  10/2017   [facility]hs)  . LUMBAR DISC SURGERY  03/1995  . NASAL SINUS SURGERY  03/2001    SOCIAL HISTORY: Social History   Tobacco Use  . Smoking status: Never Smoker  . Smokeless tobacco: Never Used  Substance Use Topics  . Alcohol use: Yes    Alcohol/week: 0.0 standard drinks    Comment: occasional  . Drug use: No    FAMILY HISTORY: Family History  Problem Relation Age of Onset  . Breast cancer Maternal Grandmother 72  . Dementia Maternal Grandmother   . Heart disease Paternal Grandfather   . Other Sister        Postural Orthostatic Tachycardia Syndrome  . Supraventricular tachycardia Sister   . Other Maternal Aunt        hysterectomy for irregular bleed with abnormal pap  . Hyperlipidemia Mother   . Alzheimer's disease Maternal Grandfather     ROS: Review of Systems  Constitutional: Positive for weight loss.    Gastrointestinal: Negative for nausea and vomiting.  Musculoskeletal:       Negative for muscle weakness  Endo/Heme/Allergies:  Positive for hypoglycemia  Psychiatric/Behavioral: Positive for depression. Negative for suicidal ideas.    PHYSICAL EXAM: Blood pressure 114/70, pulse 79, temperature 98.1 F (36.7 C), temperature source Oral, height 5\' 4"  (1.626 m), weight 255 lb (115.7 kg), SpO2 97 %. Body mass index is 43.77 kg/m. Physical Exam Vitals signs reviewed.  Constitutional:      Appearance: Normal appearance. She is well-developed. She is obese.  Cardiovascular:     Rate and Rhythm: Normal rate.  Pulmonary:     Effort: Pulmonary effort is normal.  Musculoskeletal: Normal range of motion.  Skin:    General: Skin is warm and dry.  Neurological:     Mental Status: She is alert and oriented to person, place, and time.  Psychiatric:        Mood and Affect: Mood normal.        Behavior: Behavior normal.        Thought Content: Thought content does not include homicidal or suicidal ideation.     RECENT LABS AND TESTS: BMET    Component Value Date/Time   NA 142 10/08/2018 1105   K 3.9 10/08/2018 1105   CL 102 10/08/2018 1105   CO2 24 10/08/2018 1105   GLUCOSE 102 (H) 10/08/2018 1105   GLUCOSE 89 05/16/2018 1747   BUN 8 10/08/2018 1105   CREATININE 0.86 10/08/2018 1105   CREATININE 0.68 03/28/2014 1632   CALCIUM 8.8 10/08/2018 1105   GFRNONAA 80 10/08/2018 1105   GFRAA 92 10/08/2018 1105   Lab Results  Component Value Date   HGBA1C 5.6 08/24/2018   HGBA1C 6.4 05/16/2018   HGBA1C 6.3 12/11/2017   HGBA1C 6.3 08/09/2017   HGBA1C 6.8 (H) 04/18/2017   Lab Results  Component Value Date   INSULIN 20.0 07/26/2018   CBC    Component Value Date/Time   WBC 8.9 09/01/2016 1142   RBC 4.39 09/01/2016 1142   HGB 13.2 09/01/2016 1142   HCT 39.3 09/01/2016 1142   PLT 313.0 09/01/2016 1142   MCV 89.6 09/01/2016 1142   MCH 28.0 06/21/2014 0309   MCHC 33.7  09/01/2016 1142   RDW 15.2 09/01/2016 1142   LYMPHSABS 2.4 09/01/2016 1142   MONOABS 0.2 09/01/2016 1142   EOSABS 0.2 09/01/2016 1142   BASOSABS 0.0 09/01/2016 1142   Iron/TIBC/Ferritin/ %Sat    Component Value Date/Time   IRON 62 05/30/2016 1307   FERRITIN 55.2 05/30/2016 1307   IRONPCTSAT 16.4 (L) 05/30/2016 1307   Lipid Panel     Component Value Date/Time   CHOL 127 05/16/2018 1747   CHOL 123 10/30/2012   TRIG 124.0 05/16/2018 1747   TRIG 124 10/30/2012   HDL 50.10 05/16/2018 1747   CHOLHDL 3 05/16/2018 1747   VLDL 24.8 05/16/2018 1747   LDLCALC 52 05/16/2018 1747   LDLCALC 59 10/30/2012   LDLDIRECT 73.0 04/18/2017 0847   Hepatic Function Panel     Component Value Date/Time   PROT 6.5 10/08/2018 1105   ALBUMIN 4.2 10/08/2018 1105   AST 12 10/08/2018 1105   ALT 12 10/08/2018 1105   ALKPHOS 95 10/08/2018 1105   BILITOT 0.2 10/08/2018 1105      Component Value Date/Time   TSH 2.42 05/16/2018 1747   TSH 1.80 04/07/2016 1632   TSH 2.351 03/28/2014 1632     Ref. Range 09/25/2018 15:02  Vitamin D, 25-Hydroxy Latest Ref Range: 30.0 - 100.0 ng/mL 48.9     OBESITY BEHAVIORAL INTERVENTION VISIT  Today's visit was # 5   Starting  weight: 279 lbs Starting date: 07/26/2018 Today's weight : 255 lbs  Today's date: 10/08/2018 Total lbs lost to date: 24   ASK: We discussed the diagnosis of obesity with Lisa Curry today and Lisa Curry agreed to give [place] permission to discuss obesity behavioral modification therapy today.  ASSESS: Nikiyah has the diagnosis of obesity and her BMI today is 43.75 Mckinsey is in the action stage of change   ADVISE: Lisa Curry was educated on the multiple health risks of obesity as well as the benefit of weight loss to improve her health. She was advised of the need for long term treatment and the importance of lifestyle modifications to improve her current health and to decrease her risk of future health problems.  AGREE: Multiple dietary modification  options and treatment options were discussed and  Lisa Curry agreed to follow the recommendations documented in the above note.  ARRANGE: Nikeia was educated on the importance of frequent visits to treat obesity as outlined per CMS and USPSTF guidelines and agreed to schedule her next follow up appointment today.  Corey Skains, am acting as Location manager for [employer]. Owens Shark, DO  I have reviewed the above documentation for accuracy and completeness, and I agree with the above. -Jearld Lesch, DO

## 2018-10-17 ENCOUNTER — Ambulatory Visit (INDEPENDENT_AMBULATORY_CARE_PROVIDER_SITE_OTHER): Payer: BLUE CROSS/BLUE SHIELD | Admitting: Neurology

## 2018-10-17 DIAGNOSIS — R519 Headache, unspecified: Secondary | ICD-10-CM

## 2018-10-17 DIAGNOSIS — R0683 Snoring: Secondary | ICD-10-CM

## 2018-10-17 DIAGNOSIS — F339 Major depressive disorder, recurrent, unspecified: Secondary | ICD-10-CM | POA: Diagnosis not present

## 2018-10-17 DIAGNOSIS — G4733 Obstructive sleep apnea (adult) (pediatric): Secondary | ICD-10-CM | POA: Diagnosis not present

## 2018-10-17 DIAGNOSIS — Z6841 Body Mass Index (BMI) 40.0 and over, adult: Secondary | ICD-10-CM

## 2018-10-17 DIAGNOSIS — F411 Generalized anxiety disorder: Secondary | ICD-10-CM | POA: Diagnosis not present

## 2018-10-17 DIAGNOSIS — R351 Nocturia: Secondary | ICD-10-CM

## 2018-10-17 DIAGNOSIS — R0681 Apnea, not elsewhere classified: Secondary | ICD-10-CM

## 2018-10-17 DIAGNOSIS — G4719 Other hypersomnia: Secondary | ICD-10-CM

## 2018-10-17 DIAGNOSIS — R51 Headache: Secondary | ICD-10-CM

## 2018-10-22 NOTE — Progress Notes (Signed)
Patient referred by Dr. Owens Shark from wt management clinic, seen by me on 09/25/18, HST on 10/17/18.    Please call and notify the patient that the recent home sleep test showed obstructive sleep apnea in the moderate range. While I recommend treatment for this in the form CPAP, her insurance will not approve a sleep study for this. They will likely only approve a trial of autoPAP, which means, that we don't have to bring her in for a sleep study with CPAP, but will let her try an autoPAP machine at home, through a DME company (of her choice, or as per insurance requirement). The DME representative will educate her on how to use the machine, how to put the mask on, etc. I have placed an order in the chart. Please send referral, talk to patient, send report to referring MD. We will need a FU in sleep clinic for 10 weeks post-PAP set up, please arrange that with me or one of our NPs. Thanks,   Lisa Age, MD, PhD [facility])

## 2018-10-22 NOTE — Addendum Note (Signed)
Addended by: Star Age on: 10/22/2018 07:48 AM   Modules accepted: Orders

## 2018-10-22 NOTE — Progress Notes (Signed)
Office: [phone]  /  Fax: [phone]    Date: October 23, 2018   Time Seen: 4:33pm Duration: 37 minutes Provider: Glennie Isle, Psy.D. Type of Session: Individual Therapy  Type of Contact: Face-to-face  Session Content: Lisa Curry is a 48 y.o. female presenting for a follow-up appointment to address the previously established treatment goal of decreasing emotional eating. The session was initiated with the administration of the PHQ-9 and GAD-7, as well as a brief check-in. [name] reported she learned she has sleep apnea. She also discussed having difficulty focusing, and an increase in fatigue. Thus, it was recommended she talk to her PCP; [name] agreed. [name] reported she initiated therapeutic services at [facility], and her new therapist reportedly "does not have any issue dealing with the eating piece." Thus, she expressed desire to terminate services with this provider due to financial reasons. She explained her next appointment with her new therapist is on Monday at 3pm. Regarding eating, [name] reported she maintained her weight. She discussed ongoing stressors that resulted in emotional eating. Nevertheless, she discussed making better choices. Due to the ongoing stressors and their impact on [place]'s wellbeing and subsequent eating, psychoeducation regarding pleasurable activities, including its impact on emotional eating was provided. [name] was provided with a handout with various options of pleasurable activities, and was encouraged to engage in at least 1 activity a day.  [name] agreed. This provider also discussed engaging in pleasurable activities when experiencing emotional hunger. [name] was receptive to today's session as evidenced by openness to sharing, responsiveness to feedback, and willingness to engage in pleasurable activities  Mental Status Examination: [name] arrived on time for the appointment. She presented as appropriately dressed and groomed. [name] appeared her stated age and  demonstrated adequate orientation to time, place, person, and purpose of the appointment. She also demonstrated appropriate eye contact. No psychomotor abnormalities or behavioral peculiarities noted. Her mood was euthymic with congruent affect. Her thought processes were logical, linear, and goal-directed. No hallucinations, delusions, bizarre thinking or behavior reported or observed. Judgment, insight, and impulse control appeared to be grossly intact. There was no evidence of paraphasias (i.e., errors in speech, gross mispronunciations, and word substitutions), repetition deficits, or disturbances in volume or prosody (i.e., rhythm and intonation). There was no evidence of attention or memory impairments. Promyse denied current suicidal and homicidal ideation, plan and intent.   Structured Assessment Results: The Patient Health Questionnaire-9 (PHQ-9) is a self-report measure that assesses symptoms and severity of depression over the course of the last two weeks. [name] obtained a score of 12 suggesting moderate depression. [name] finds the endorsed symptoms to be somewhat difficult. Depression screen PHQ 2/9 10/23/2018  Decreased Interest 2  Down, Depressed, Hopeless 1  PHQ - 2 Score 3  Altered sleeping 1  Tired, decreased energy 3  Change in appetite 2  Feeling bad or failure about yourself  0  Trouble concentrating 2  Moving slowly or fidgety/restless 1  Suicidal thoughts 0  PHQ-9 Score 12  Difficult doing work/chores -   The Generalized Anxiety Disorder-7 (GAD-7) is a brief self-report measure that assesses symptoms of anxiety over the course of the last two weeks. [name] obtained a score of 6 suggesting mild anxiety. GAD 7 : Generalized Anxiety Score 10/23/2018  Nervous, Anxious, on Edge 2  Control/stop worrying 1  Worry too much - different things 0  Trouble relaxing 1  Restless 0  Easily annoyed or irritable 1  Afraid - awful might happen 1  Total GAD 7 Score 6  Anxiety Difficulty Somewhat  difficult   Interventions:  Administration of PHQ-9 and GAD-7 for symptom monitoring Empathic reflections and validation Psychoeducation regarding pleasurable activities Brief chart review  DSM-5 Diagnosis: 311 (F32.8) Other Specified Depressive Disorder, Emotional Eating Behaviors  Treatment Goal & Progress: During the initial appointment with this provider, the following treatment goal was established: decrease emotional eating. Lisa Curry has demonstrated progress in her goal as evidenced by increased awareness of hunger patterns and triggers for emotional eating. During today's appointment, she demonstrated willingness to engage in pleasurable activities.  Plan: Today was Lisa Curry's last appointment with this provider as she initiated therapeutic services at [facility]. Her next appointment is on Monday at 3 PM.

## 2018-10-22 NOTE — Procedures (Signed)
[facility] [place], [place] [address] NAME: Lisa Curry                                                                                    DOB: 1971-05-02 MEDICAL RECORD no:   [id number]                                                      DOS:  10/17/2018 REFERRING PHYSICIAN: Jearld Lesch, DO STUDY PERFORMED: Home Sleep Test on Watch Pat HISTORY: 48 year old woman with a history of diabetes, hyperlipidemia, history of fatty liver, vitamin D deficiency, and morbid obesity, who reports snoring and excessive daytime somnolence. Her Epworth sleepiness score is 11 out of 24. BMI of 43.7. STUDY RESULTS:    Total Recording Time: 7 hrs, 30 mins; Total Sleep Time:  6 hrs, 38 mins Total Apnea/Hypopnea Index (AHI): 20.5/h; RDI: 20.8h; REM AHI: 43.9/h Average Oxygen Saturation: 94%; Lowest Oxygen Desaturation: 86%  Total Time Oxygen Saturation Below or at 88%: 1.4 minutes  Average Heart Rate:   79 bpm (between 51 and 109 bpm) IMPRESSION: OSA RECOMMENDATION: This home sleep test demonstrates moderate obstructive sleep apnea (by number of events) with a total AHI of 20.5/hour and O2 nadir of 86%. Given the patient's medical history and sleep related complaints, treatment with positive airway pressure (in the form of CPAP) is recommended. This will require a full night CPAP titration study for proper treatment settings, O2 monitoring and mask fitting. Based on the severity of the sleep disordered breathing, an attended titration study would be indicated. However, patient's insurance has denied an attended sleep study; therefore, the patient will be advised to proceed with an autoPAP titration/trial at home for now. Please note that untreated obstructive sleep apnea may carry additional perioperative morbidity. Patients with significant obstructive sleep apnea should receive perioperative PAP therapy and the surgeons and particularly the anesthesiologist should be  informed of the diagnosis and the severity of the sleep disordered breathing. The patient should be cautioned not to drive, work at heights, or operate dangerous or heavy equipment when tired or sleepy. Review and reiteration of good sleep hygiene measures should be pursued with any patient. Other causes of the patient's symptoms, including circadian rhythm disturbances, an underlying mood disorder, medication effect and/or an underlying medical problem cannot be ruled out based on this test. Clinical correlation is recommended. The patient and his referring provider will be notified of the test results. The patient will be seen in follow up in sleep clinic at [facility].  I certify that I have reviewed the raw data recording prior to the issuance of this report in accordance with the standards of the American Academy of Sleep Medicine (AASM).  Star Age, MD, PhD [facility]) Diplomat, ABPN (Neurology and Sleep)

## 2018-10-23 ENCOUNTER — Ambulatory Visit (INDEPENDENT_AMBULATORY_CARE_PROVIDER_SITE_OTHER): Payer: BLUE CROSS/BLUE SHIELD | Admitting: Bariatrics

## 2018-10-23 ENCOUNTER — Encounter (INDEPENDENT_AMBULATORY_CARE_PROVIDER_SITE_OTHER): Payer: Self-pay | Admitting: Bariatrics

## 2018-10-23 ENCOUNTER — Ambulatory Visit (INDEPENDENT_AMBULATORY_CARE_PROVIDER_SITE_OTHER): Payer: BLUE CROSS/BLUE SHIELD | Admitting: Psychology

## 2018-10-23 ENCOUNTER — Telehealth: Payer: Self-pay

## 2018-10-23 VITALS — BP 101/59 | HR 82 | Temp 97.5°F | Ht 64.0 in | Wt 256.0 lb

## 2018-10-23 DIAGNOSIS — E119 Type 2 diabetes mellitus without complications: Secondary | ICD-10-CM | POA: Diagnosis not present

## 2018-10-23 DIAGNOSIS — E559 Vitamin D deficiency, unspecified: Secondary | ICD-10-CM

## 2018-10-23 DIAGNOSIS — J309 Allergic rhinitis, unspecified: Secondary | ICD-10-CM

## 2018-10-23 DIAGNOSIS — Z6841 Body Mass Index (BMI) 40.0 and over, adult: Secondary | ICD-10-CM

## 2018-10-23 DIAGNOSIS — Z9189 Other specified personal risk factors, not elsewhere classified: Secondary | ICD-10-CM

## 2018-10-23 DIAGNOSIS — F3289 Other specified depressive episodes: Secondary | ICD-10-CM | POA: Diagnosis not present

## 2018-10-23 DIAGNOSIS — G4733 Obstructive sleep apnea (adult) (pediatric): Secondary | ICD-10-CM

## 2018-10-23 NOTE — Telephone Encounter (Signed)
-----   Message from Star Age, MD sent at 10/22/2018  7:48 AM EST ----- Patient referred by Dr. Owens Shark from wt management clinic, seen by me on 09/25/18, HST on 10/17/18.    Please call and notify the patient that the recent home sleep test showed obstructive sleep apnea in the moderate range. While I recommend treatment for this in the form CPAP, her insurance will not approve a sleep study for this. They will likely only approve a trial of autoPAP, which means, that we don't have to bring her in for a sleep study with CPAP, but will let her try an autoPAP machine at home, through a DME company (of her choice, or as per insurance requirement). The DME representative will educate her on how to use the machine, how to put the mask on, etc. I have placed an order in the chart. Please send referral, talk to patient, send report to referring MD. We will need a FU in sleep clinic for 10 weeks post-PAP set up, please arrange that with me or one of our NPs. Thanks,   Star Age, MD, PhD [facility])

## 2018-10-23 NOTE — Telephone Encounter (Signed)
Pt returned my call, I had a very extended conversation with her > 10 minutes regarding her sleep study results. Pt is very reluctant to try auto pap to treat her moderate osa. She reports several concerns such as moving around at night, sleeping part of the night in the recliner, having pets in the bedroom, and being claustrophobic. I offered pt an appt with Dr. Rexene Alberts to discuss this and other treatment options but pt declined. I offered to speak with Dr. Rexene Alberts about alternative treatment options such as oral appliance, etc, but pt declined. I offered to send the order to the DME and have them contact pt to discuss auto pap further but pt declined. I offered pt to have our sleep lab call her to set up an appt to perhaps try a cpap densensitization during the day to see if she tolerates cpap. Pt did agree to this but has questions regarding if this appt will have a copay. I advised her that I will ask our sleep lab to discuss all of this, including if there is a copay. Pt verbalized understanding of results.  Shirlean Mylar, please call pt to discuss coming in for a desensitization for cpap. Pt is very reluctant to use a cpap. Please let me know if pt is willing to proceed with the DME referral after the desensitization appt. Pt also wants to know if there will be a charge for this appt.

## 2018-10-23 NOTE — Telephone Encounter (Signed)
I called pt to discuss her sleep study results. No answer, left a message asking her to call me back. 

## 2018-10-24 NOTE — Progress Notes (Signed)
Office: [phone]  /  Fax: [phone]   HPI:   Chief Complaint: OBESITY Catilyn is here to discuss her progress with her obesity treatment plan. She is on the Category 3 plan and is following her eating plan approximately 20 % of the time. She states she is exercising 0 minutes 0 times per week. Clay has done well overall. She gained one pound since her last visit. She has been tempted by some snacks. Kanyah had an increase in water weight (increased sodium, more processed foods).  Her weight is 256 lb (116.1 kg) today and has had a weight gain of 1 pound over a period of 2 weeks since her last visit. She has lost 23 lbs since starting treatment with [place].  Diabetes II Odis has a diagnosis of diabetes type II. She is taking Actos and Tradjenta. Jisell states fasting BGs range between 120 and 150's and 2 hour post prandial BGs range in the 150's. Nyaira had missed some doses of Tradjenta. She denies any hypoglycemic episodes. Last A1c was at 5.6 and was previously at 6.4 She has been working on intensive lifestyle modifications including diet, exercise, and weight loss to help control her blood glucose levels.  Vitamin D deficiency Lexee has a diagnosis of vitamin D deficiency. Her last vitamin D level was at 48.9 She is currently taking vit D and denies nausea, vomiting or muscle weakness.  At risk for osteopenia and osteoporosis Darrah is at higher risk of osteopenia and osteoporosis due to vitamin D deficiency.   Sinus Allergies Tammie has a diagnosis of sinus allergies. She admits to headaches.  OSA (obstructive sleep apnea) Neila had a sleep study (moderate results). Kelsa admits to headaches.  ASSESSMENT AND PLAN:  Type 2 diabetes mellitus without complication, without long-term current use of insulin (HCC)  Vitamin D deficiency  Allergic sinusitis  OSA (obstructive sleep apnea)  At risk for osteoporosis  Class 3 severe obesity with serious comorbidity and body mass index (BMI) of  40.0 to 44.9 in adult, unspecified obesity type ([place])  PLAN:  Diabetes II Bennetta has been given extensive diabetes education by myself today including ideal fasting and post-prandial blood glucose readings, individual ideal Hgb A1c goals and hypoglycemia prevention. We discussed the importance of good blood sugar control to decrease the likelihood of diabetic complications such as nephropathy, neuropathy, limb loss, blindness, coronary artery disease, and death. We discussed the importance of intensive lifestyle modification including diet, exercise and weight loss as the first line treatment for diabetes. Kalena agrees to continue her diabetes medications and continue to check fasting BGs and 2 hour post prandial BGs. Reisa will follow up at the agreed upon time.  Vitamin D Deficiency Marillyn was informed that low vitamin D levels contributes to fatigue and are associated with obesity, breast, and colon cancer. She agrees to begin OTC Vit D @5 ,000 IU daily and we will recheck vitamin D level in 3 months. She will follow up for routine testing of vitamin D, at least 2-3 times per year. She was informed of the risk of over-replacement of vitamin D and agrees to not increase her dose unless she discusses this with [place] first. Yamaira agrees to follow up as directed.  At risk for osteopenia and osteoporosis Mallori was given extended  (15 minutes) osteoporosis prevention counseling today. Anisten is at risk for osteopenia and osteoporosis due to her vitamin D deficiency. She was encouraged to take her vitamin D and follow her higher calcium diet and increase strengthening  exercise to help strengthen her bones and decrease her risk of osteopenia and osteoporosis.  Sinus Allergies Andrika agrees to use OTC Flonase and OTC Excedrin. Gayatri agrees to follow up with our clinic at the agreed upon time.  OSA (obstructive sleep apnea) Raileigh will follow up with her neurologist. She will follow up with our clinic at the agreed upon  time.  Obesity Rylin is currently in the action stage of change. As such, her goal is to continue with weight loss efforts She has agreed to follow the Category 3 plan Raeley has been instructed to work up to a goal of 150 minutes of combined cardio and strengthening exercise per week for weight loss and overall health benefits. We discussed the following Behavioral Modification Strategies today: increase H2O intake, keeping healthy foods in the home, increasing lean protein intake, decreasing simple carbohydrates, increasing vegetables, decreasing sodium intake, decreased processed foods and work on meal planning and easy cooking plans  Cathren has agreed to follow up with our clinic in 2 weeks. She was informed of the importance of frequent follow up visits to maximize her success with intensive lifestyle modifications for her multiple health conditions.  ALLERGIES: Allergies  Allergen Reactions  . Biaxin [Clarithromycin] Hives  . Metformin And Related Diarrhea    MEDICATIONS: Current Outpatient Medications on File Prior to Visit  Medication Sig Dispense Refill  . acetaminophen-codeine (TYLENOL #3) 300-30 MG tablet Take 1 tablet by mouth.    . Aspirin-Acetaminophen-Caffeine (EXCEDRIN PO) Take 1 capsule by mouth daily.    Marland Kitchen atorvastatin (LIPITOR) 20 MG tablet TAKE 1 TABLET (20 MG TOTAL) BY MOUTH DAILY AT 6 PM. 90 tablet 2  . buPROPion (WELLBUTRIN SR) 100 MG 12 hr tablet Take 1 tablet (100 mg total) by mouth 2 (two) times daily. 180 tablet 1  . Cholecalciferol (VITAMIN D3) 125 MCG (5000 UT) TABS Take 1 tablet by mouth daily.    . clonazePAM (KLONOPIN) 0.5 MG tablet Take 1 tablet (0.5 mg total) by mouth 2 (two) times daily as needed for anxiety. 60 tablet 0  . cyclobenzaprine (FLEXERIL) 10 MG tablet PLEASE SEE ATTACHED FOR DETAILED DIRECTIONS 30 tablet 1  . dexlansoprazole (DEXILANT) 60 MG capsule TAKE 1 CAPSULE (60 MG TOTAL) BY MOUTH DAILY. 90 capsule 0  . dextromethorphan-guaiFENesin (MUCINEX  DM) 30-600 MG 12hr tablet Take 1 tablet by mouth as needed for cough.    . fluticasone (FLONASE) 50 MCG/ACT nasal spray Place into both nostrils daily.    Marland Kitchen glimepiride (AMARYL) 4 MG tablet Take 1 tablet (4 mg total) by mouth 2 (two) times daily. 180 tablet 0  . glucose blood (ONE TOUCH ULTRA TEST) test strip Use to check sugar twice daily and as needed. Dx: E11.65 300 each 3  . ibuprofen (ADVIL,MOTRIN) 600 MG tablet Take 600 mg by mouth daily.    . Insulin Pen Needle 31G X 6 MM MISC Use once daily for Victoza SQ 100 each 0  . Lancet Devices (ONE TOUCH DELICA LANCING DEV) MISC Use as directed with lancets and one touch meter E11.65 1 each 0  . liraglutide (VICTOZA) 18 MG/3ML SOPN Inject 0.1 mLs (0.6 mg total) into the skin daily. 9 pen 0  . meclizine (ANTIVERT) 12.5 MG tablet Take 12.5 mg by mouth 3 (three) times daily as needed for dizziness.    . Multiple Vitamin (MULTIVITAMIN) capsule Take 1 capsule by mouth daily.    Glory Rosebush DELICA LANCETS 86V MISC Check blood sugar twice daily and as directed. Dx  E11.65 200 each 0  . pioglitazone (ACTOS) 30 MG tablet Take 30 mg by mouth daily.    Marland Kitchen POTASSIUM PO Take by mouth daily.    . ranitidine (ZANTAC) 150 MG tablet Take 150 mg by mouth at bedtime.    . topiramate (TOPAMAX) 50 MG tablet Take 1 tablet (50 mg total) by mouth daily. 30 tablet 0  . TRADJENTA 5 MG TABS tablet TAKE 1 TABLET BY MOUTH EVERY DAY 30 tablet 0  . Venlafaxine HCl 225 MG TB24 TAKE 1 TABLET (225 MG TOTAL) BY MOUTH DAILY. 90 tablet 1  . Vitamin D, Ergocalciferol, (DRISDOL) 1.25 MG (50000 UT) CAPS capsule Take 1 capsule (50,000 Units total) by mouth every 14 (fourteen) days. 2 capsule 0   Current Facility-Administered Medications on File Prior to Visit  Medication Dose Route Frequency Provider Last Rate Last Dose  . diclofenac sodium (VOLTAREN) 1 % transdermal gel 2 g  2 g Topical QID Jessy Oto, MD        PAST MEDICAL HISTORY: Past Medical History:  Diagnosis Date  .  Allergy   . Anemia   . Anxiety   . Barrett esophagus    Don Bulla, have not received EGD report  . Chronic back pain   . Constipation   . Depression with anxiety   . Diabetes mellitus without complication ([place])    DSME 04/2014  . Edema, lower extremity   . Fatty liver   . Gastric polyp 05/2012   h/o EGD  . GERD (gastroesophageal reflux disease)   . History of Helicobacter pylori infection   . HLD (hyperlipidemia)   . Hyperlipidemia   . Vitamin D deficiency     PAST SURGICAL HISTORY: Past Surgical History:  Procedure Laterality Date  . CESAREAN SECTION  6/95, 4/98  . DILATION AND CURETTAGE OF UTERUS  2011  . ESOPHAGOGASTRODUODENOSCOPY  2012?   Ferdinand Lango at [facility]  . ESOPHAGOGASTRODUODENOSCOPY  10/2017   [facility] Ardis Hughs)  . LUMBAR DISC SURGERY  03/1995  . NASAL SINUS SURGERY  03/2001    SOCIAL HISTORY: Social History   Tobacco Use  . Smoking status: Never Smoker  . Smokeless tobacco: Never Used  Substance Use Topics  . Alcohol use: Yes    Alcohol/week: 0.0 standard drinks    Comment: occasional  . Drug use: No    FAMILY HISTORY: Family History  Problem Relation Age of Onset  . Breast cancer Maternal Grandmother 21  . Dementia Maternal Grandmother   . Heart disease Paternal Grandfather   . Other Sister        Postural Orthostatic Tachycardia Syndrome  . Supraventricular tachycardia Sister   . Other Maternal Aunt        hysterectomy for irregular bleed with abnormal pap  . Hyperlipidemia Mother   . Alzheimer's disease Maternal Grandfather     ROS: Review of Systems  Constitutional: Negative for weight loss.  Gastrointestinal: Negative for nausea and vomiting.  Musculoskeletal:       Negative for muscle weakness  Neurological: Positive for headaches.  Endo/Heme/Allergies:       Negative for hypoglycemia    PHYSICAL EXAM: Blood pressure (!) 101/59, pulse 82, temperature (!) 97.5 F (36.4 C), temperature source Oral, height 5\' 4"  (1.626 m),  weight 256 lb (116.1 kg), SpO2 99 %. Body mass index is 43.94 kg/m. Physical Exam Vitals signs reviewed.  Constitutional:      Appearance: Normal appearance. She is well-developed. She is obese.  Cardiovascular:  Rate and Rhythm: Normal rate.  Pulmonary:     Effort: Pulmonary effort is normal.  Musculoskeletal: Normal range of motion.  Skin:    General: Skin is warm and dry.  Neurological:     Mental Status: She is alert and oriented to person, place, and time.  Psychiatric:        Mood and Affect: Mood normal.        Behavior: Behavior normal.     RECENT LABS AND TESTS: BMET    Component Value Date/Time   NA 142 10/08/2018 1105   K 3.9 10/08/2018 1105   CL 102 10/08/2018 1105   CO2 24 10/08/2018 1105   GLUCOSE 102 (H) 10/08/2018 1105   GLUCOSE 89 05/16/2018 1747   BUN 8 10/08/2018 1105   CREATININE 0.86 10/08/2018 1105   CREATININE 0.68 03/28/2014 1632   CALCIUM 8.8 10/08/2018 1105   GFRNONAA 80 10/08/2018 1105   GFRAA 92 10/08/2018 1105   Lab Results  Component Value Date   HGBA1C 5.6 08/24/2018   HGBA1C 6.4 05/16/2018   HGBA1C 6.3 12/11/2017   HGBA1C 6.3 08/09/2017   HGBA1C 6.8 (H) 04/18/2017   Lab Results  Component Value Date   INSULIN 20.0 07/26/2018   CBC    Component Value Date/Time   WBC 8.9 09/01/2016 1142   RBC 4.39 09/01/2016 1142   HGB 13.2 09/01/2016 1142   HCT 39.3 09/01/2016 1142   PLT 313.0 09/01/2016 1142   MCV 89.6 09/01/2016 1142   MCH 28.0 06/21/2014 0309   MCHC 33.7 09/01/2016 1142   RDW 15.2 09/01/2016 1142   LYMPHSABS 2.4 09/01/2016 1142   MONOABS 0.2 09/01/2016 1142   EOSABS 0.2 09/01/2016 1142   BASOSABS 0.0 09/01/2016 1142   Iron/TIBC/Ferritin/ %Sat    Component Value Date/Time   IRON 62 05/30/2016 1307   FERRITIN 55.2 05/30/2016 1307   IRONPCTSAT 16.4 (L) 05/30/2016 1307   Lipid Panel     Component Value Date/Time   CHOL 127 05/16/2018 1747   CHOL 123 10/30/2012   TRIG 124.0 05/16/2018 1747   TRIG 124  10/30/2012   HDL 50.10 05/16/2018 1747   CHOLHDL 3 05/16/2018 1747   VLDL 24.8 05/16/2018 1747   LDLCALC 52 05/16/2018 1747   LDLCALC 59 10/30/2012   LDLDIRECT 73.0 04/18/2017 0847   Hepatic Function Panel     Component Value Date/Time   PROT 6.5 10/08/2018 1105   ALBUMIN 4.2 10/08/2018 1105   AST 12 10/08/2018 1105   ALT 12 10/08/2018 1105   ALKPHOS 95 10/08/2018 1105   BILITOT 0.2 10/08/2018 1105      Component Value Date/Time   TSH 2.42 05/16/2018 1747   TSH 1.80 04/07/2016 1632   TSH 2.351 03/28/2014 1632     Ref. Range 09/25/2018 15:02  Vitamin D, 25-Hydroxy Latest Ref Range: 30.0 - 100.0 ng/mL 48.9     OBESITY BEHAVIORAL INTERVENTION VISIT  Today's visit was # 6   Starting weight: 279 lbs Starting date: 07/26/2018 Today's weight : 256 lbs Today's date: 10/23/2018 Total lbs lost to date: 23    ASK: We discussed the diagnosis of obesity with [name] today and [name] agreed to give [place] permission to discuss obesity behavioral modification therapy today.  ASSESS: Annalicia has the diagnosis of obesity and her BMI today is 43.92 Kamirah is in the action stage of change   ADVISE: Teresita was educated on the multiple health risks of obesity as well as the benefit of weight loss to improve  her health. She was advised of the need for long term treatment and the importance of lifestyle modifications to improve her current health and to decrease her risk of future health problems.  AGREE: Multiple dietary modification options and treatment options were discussed and  Jadesola agreed to follow the recommendations documented in the above note.  ARRANGE: Kerin was educated on the importance of frequent visits to treat obesity as outlined per CMS and USPSTF guidelines and agreed to schedule her next follow up appointment today.  Corey Skains, am acting as Location manager for [employer]. Owens Shark, DO  I have reviewed the above documentation for accuracy and completeness, and I agree  with the above. -Jearld Lesch, DO

## 2018-10-26 NOTE — Telephone Encounter (Signed)
Pt called back, has not heard yet from our sleep lab. Our phone staff asked her to give our sleep lab time to call her and she should expect a call on Monday.

## 2018-10-26 NOTE — Telephone Encounter (Signed)
Pt has called stating she was returning call to [employer].  Pt was told that Shirlean Mylar will be calling her on Monday.  Pt states if she hasnt heard anything by lunch time she will call back

## 2018-10-29 ENCOUNTER — Telehealth: Payer: Self-pay

## 2018-10-29 DIAGNOSIS — F339 Major depressive disorder, recurrent, unspecified: Secondary | ICD-10-CM | POA: Diagnosis not present

## 2018-10-29 DIAGNOSIS — F411 Generalized anxiety disorder: Secondary | ICD-10-CM | POA: Diagnosis not present

## 2018-10-29 NOTE — Telephone Encounter (Signed)
Noted thanks °

## 2018-10-29 NOTE — Telephone Encounter (Signed)
Spoke with patient regarding her desensitization to cpap. She is reluctant to starting cpap because she does not like anything on her face and she has two cats that will pull at her hose. I explained that I could work with her and show the different styles of masks she can try. She asked questions on the mouth guard and I explained that this is another option she could talk with Dr. Rexene Alberts about. She wanted to know the cost of both. I explained that I did not know what her insurance would pay and did not know her deductible. I gave her the charge for cpap and dental device. She is going to think this over and get back with me.

## 2018-11-04 ENCOUNTER — Other Ambulatory Visit: Payer: Self-pay | Admitting: Family Medicine

## 2018-11-05 ENCOUNTER — Ambulatory Visit (INDEPENDENT_AMBULATORY_CARE_PROVIDER_SITE_OTHER): Payer: BLUE CROSS/BLUE SHIELD | Admitting: Family Medicine

## 2018-11-05 ENCOUNTER — Encounter (INDEPENDENT_AMBULATORY_CARE_PROVIDER_SITE_OTHER): Payer: Self-pay | Admitting: Family Medicine

## 2018-11-05 VITALS — BP 107/70 | HR 82 | Temp 98.2°F | Ht 64.0 in | Wt 251.0 lb

## 2018-11-05 DIAGNOSIS — E119 Type 2 diabetes mellitus without complications: Secondary | ICD-10-CM

## 2018-11-05 DIAGNOSIS — F411 Generalized anxiety disorder: Secondary | ICD-10-CM | POA: Diagnosis not present

## 2018-11-05 DIAGNOSIS — F3289 Other specified depressive episodes: Secondary | ICD-10-CM

## 2018-11-05 DIAGNOSIS — F339 Major depressive disorder, recurrent, unspecified: Secondary | ICD-10-CM | POA: Diagnosis not present

## 2018-11-05 DIAGNOSIS — Z9189 Other specified personal risk factors, not elsewhere classified: Secondary | ICD-10-CM | POA: Diagnosis not present

## 2018-11-05 DIAGNOSIS — Z6841 Body Mass Index (BMI) 40.0 and over, adult: Secondary | ICD-10-CM

## 2018-11-05 MED ORDER — LIRAGLUTIDE 18 MG/3ML ~~LOC~~ SOPN: 0.6000 mg | PEN_INJECTOR | Freq: Every day | SUBCUTANEOUS | 0 refills | Status: DC

## 2018-11-06 NOTE — Progress Notes (Signed)
Office: [phone]  /  Fax: [phone]   HPI:   Chief Complaint: OBESITY Lisa Curry is here to discuss her progress with her obesity treatment plan. She is on the Category 3 plan and is following her eating plan approximately 0 % of the time. She states she is exercising 0 minutes 0 times per week. Lisa Curry is cravings carbohydrates. She has been  caring for her grandmother recently which has caused her to be off plan at times. Her weight is 251 lb (113.9 kg) today and has had a weight loss of 5 pounds over a period of 2 weeks since her last visit. She has lost 28 lbs since starting treatment with [place].  Diabetes II Lisa Curry has a diagnosis of diabetes type II. Lisa Curry's diabetes is well controlled on Victoza, glimepiride, Actos, and Tradjenta. She states fasting BGs range between 113 and 170, and 2 hour post prandials range between 122 and 160. She denies hypoglycemia. Last Lisa Curry was 5.6 on 08/24/18. She has been working on intensive lifestyle modifications including diet, exercise, and weight loss to help control her blood glucose levels.  At risk for cardiovascular disease Lisa Curry is at a higher than average risk for cardiovascular disease due to obesity and diabetes II. She currently denies any chest pain.  Depression with emotional eating behaviors Lisa Curry is on bupropion and she is seeing Dr. Mallie Mussel. Her symptoms are not well controlled. Lisa Curry struggles with emotional eating and using food for comfort to the extent that it is negatively impacting her health. She often snacks when she is not hungry. Lisa Curry sometimes feels she is out of control and then feels guilty that she made poor food choices. She has been working on behavior modification techniques to help reduce her emotional eating and has been somewhat successful. She shows no sign of suicidal or homicidal ideations.  Depression screen [facility] 2/9 10/23/2018 10/08/2018 09/20/2018 07/26/2018 05/16/2018  Decreased Interest 2 2 1 1 3   Down, Depressed, Hopeless 1 1 1 1  1   PHQ - 2 Score 3 3 2 2 4   Altered sleeping 1 0 0 0 1  Tired, decreased energy 3 3 1 2 2   Change in appetite 2 2 0 2 2  Feeling bad or failure about yourself  0 1 0 2 1  Trouble concentrating 2 1 0 1 2  Moving slowly or fidgety/restless 1 0 0 0 1  Suicidal thoughts 0 0 0 0 0  PHQ-9 Score 12 10 3 9 13   Difficult doing work/chores - - - Somewhat difficult -    ASSESSMENT AND PLAN:  Type 2 diabetes mellitus without complication, without long-term current use of insulin (HCC) - Plan: liraglutide (VICTOZA) 18 MG/3ML SOPN  Other depression - with emotional eating  At risk for heart disease  Class 3 severe obesity with serious comorbidity and body mass index (BMI) of 40.0 to 44.9 in adult, unspecified obesity type (HCC)  PLAN:  Diabetes II Valyncia has been given extensive diabetes education by myself today including ideal fasting and post-prandial blood glucose readings, individual ideal Hgb Lisa Curry goals and hypoglycemia prevention. We discussed the importance of good blood sugar control to decrease the likelihood of diabetic complications such as nephropathy, neuropathy, limb loss, blindness, coronary artery disease, and death. We discussed the importance of intensive lifestyle modification including diet, exercise and weight loss as the first line treatment for diabetes. Amiaya agrees to continue Victoza 0.6 mg SubQ daily #2 pens and we will refill for 1 month. She agrees to continue  taking Tradjenta, glimepiride, Actos, and Victoza. We will recheck Lisa Curry at next visit. Lisa Curry agrees to follow up with our clinic in 2 weeks.  Cardiovascular risk counseling Lisa Curry was given extended (15 minutes) coronary artery disease prevention counseling today. She is 48 y.o. female and has risk factors for heart disease including obesity and diabetes II. We discussed intensive lifestyle modifications today with an emphasis on specific weight loss instructions and strategies. Pt was also informed of the importance of  increasing exercise and decreasing saturated fats to help prevent heart disease.  Depression with Emotional Eating Behaviors We discussed behavior modification techniques today to help Lisa Curry deal with her emotional eating and depression. Lisa Curry agrees to continue taking bupropion and she will continue meeting with Dr. Mallie Mussel. Dayane agrees to follow up with our clinic in 2 weeks.  Obesity Lisa Curry is currently in the action stage of change. As such, her goal is to continue with weight loss efforts She has agreed to keep a food journal with 1500-1600 calories and 100 grams of protein daily or follow the Category 3 plan Lisa Curry will continue current exercise regimen for weight loss and overall health benefits. We discussed the following Behavioral Modification Strategies today: work on meal planning and easy cooking plans, no skipping meals, planning for success, and keep a strict food journal Lisa Curry may journal. Handouts were given: Journaling and Dining Out.  Lisa Curry has agreed to follow up with our clinic in 2 weeks. She was informed of the importance of frequent follow up visits to maximize her success with intensive lifestyle modifications for her multiple health conditions.  ALLERGIES: Allergies  Allergen Reactions  . Biaxin [Clarithromycin] Hives  . Metformin And Related Diarrhea    MEDICATIONS: Current Outpatient Medications on File Prior to Visit  Medication Sig Dispense Refill  . acetaminophen-codeine (TYLENOL #3) 300-30 MG tablet Take 1 tablet by mouth.    . Aspirin-Acetaminophen-Caffeine (EXCEDRIN PO) Take 1 capsule by mouth daily.    Marland Kitchen atorvastatin (LIPITOR) 20 MG tablet TAKE 1 TABLET (20 MG TOTAL) BY MOUTH DAILY AT 6 PM. 90 tablet 2  . buPROPion (WELLBUTRIN SR) 100 MG 12 hr tablet Take 1 tablet (100 mg total) by mouth 2 (two) times daily. 180 tablet 1  . Cholecalciferol (VITAMIN D3) 125 MCG (5000 UT) TABS Take 1 tablet by mouth daily.    . clonazePAM (KLONOPIN) 0.5 MG tablet Take 1 tablet  (0.5 mg total) by mouth 2 (two) times daily as needed for anxiety. 60 tablet 0  . cyclobenzaprine (FLEXERIL) 10 MG tablet PLEASE SEE ATTACHED FOR DETAILED DIRECTIONS 30 tablet 1  . dexlansoprazole (DEXILANT) 60 MG capsule TAKE 1 CAPSULE (60 MG TOTAL) BY MOUTH DAILY. 90 capsule 0  . dextromethorphan-guaiFENesin (MUCINEX DM) 30-600 MG 12hr tablet Take 1 tablet by mouth as needed for cough.    . fluticasone (FLONASE) 50 MCG/ACT nasal spray Place into both nostrils daily.    Marland Kitchen glimepiride (AMARYL) 4 MG tablet Take 1 tablet (4 mg total) by mouth 2 (two) times daily. 180 tablet 0  . glucose blood (ONE TOUCH ULTRA TEST) test strip Use to check sugar twice daily and as needed. Dx: E11.65 300 each 3  . ibuprofen (ADVIL,MOTRIN) 600 MG tablet Take 600 mg by mouth daily.    . Insulin Pen Needle 31G X 6 MM MISC Use once daily for Victoza SQ 100 each 0  . Lancet Devices (ONE TOUCH DELICA LANCING DEV) MISC Use as directed with lancets and one touch meter E11.65 1 each 0  .  meclizine (ANTIVERT) 12.5 MG tablet Take 12.5 mg by mouth 3 (three) times daily as needed for dizziness.    . Multiple Vitamin (MULTIVITAMIN) capsule Take 1 capsule by mouth daily.    Glory Rosebush DELICA LANCETS 82N MISC Check blood sugar twice daily and as directed. Dx E11.65 200 each 0  . pioglitazone (ACTOS) 30 MG tablet Take 30 mg by mouth daily.    Marland Kitchen POTASSIUM PO Take by mouth daily.    . ranitidine (ZANTAC) 150 MG tablet Take 150 mg by mouth at bedtime.    . topiramate (TOPAMAX) 50 MG tablet Take 1 tablet (50 mg total) by mouth daily. 30 tablet 0  . Venlafaxine HCl 225 MG TB24 TAKE 1 TABLET (225 MG TOTAL) BY MOUTH DAILY. 90 tablet 1  . Vitamin D, Ergocalciferol, (DRISDOL) 1.25 MG (50000 UT) CAPS capsule Take 1 capsule (50,000 Units total) by mouth every 14 (fourteen) days. 2 capsule 0  . TRADJENTA 5 MG TABS tablet TAKE 1 TABLET BY MOUTH EVERY DAY 30 tablet 0   Current Facility-Administered Medications on File Prior to Visit  Medication  Dose Route Frequency Provider Last Rate Last Dose  . diclofenac sodium (VOLTAREN) 1 % transdermal gel 2 g  2 g Topical QID Jessy Oto, MD        PAST MEDICAL HISTORY: Past Medical History:  Diagnosis Date  . Allergy   . Anemia   . Anxiety   . Barrett esophagus    Don Bulla, have not received EGD report  . Chronic back pain   . Constipation   . Depression with anxiety   . Diabetes mellitus without complication ([place])    DSME 04/2014  . Edema, lower extremity   . Fatty liver   . Gastric polyp 05/2012   h/o EGD  . GERD (gastroesophageal reflux disease)   . History of Helicobacter pylori infection   . HLD (hyperlipidemia)   . Hyperlipidemia   . Vitamin D deficiency     PAST SURGICAL HISTORY: Past Surgical History:  Procedure Laterality Date  . CESAREAN SECTION  6/95, 4/98  . DILATION AND CURETTAGE OF UTERUS  2011  . ESOPHAGOGASTRODUODENOSCOPY  2012?   [facility]  . ESOPHAGOGASTRODUODENOSCOPY  10/2017   [facility] Ardis Hughs)  . LUMBAR DISC SURGERY  03/1995  . NASAL SINUS SURGERY  03/2001    SOCIAL HISTORY: Social History   Tobacco Use  . Smoking status: Never Smoker  . Smokeless tobacco: Never Used  Substance Use Topics  . Alcohol use: Yes    Alcohol/week: 0.0 standard drinks    Comment: occasional  . Drug use: No    FAMILY HISTORY: Family History  Problem Relation Age of Onset  . Breast cancer Maternal Grandmother 56  . Dementia Maternal Grandmother   . Heart disease Paternal Grandfather   . Other Sister        Postural Orthostatic Tachycardia Syndrome  . Supraventricular tachycardia Sister   . Other Maternal Aunt        hysterectomy for irregular bleed with abnormal pap  . Hyperlipidemia Mother   . Alzheimer's disease Maternal Grandfather     ROS: Review of Systems  Constitutional: Positive for weight loss.  Cardiovascular: Negative for chest pain.  Endo/Heme/Allergies:       Negative hypoglycemia  Psychiatric/Behavioral: Positive  for depression. Negative for suicidal ideas.    PHYSICAL EXAM: Blood pressure 107/70, pulse 82, temperature 98.2 F (36.8 C), height 5\' 4"  (1.626 m), weight 251 lb (113.9 kg),  last menstrual period 10/03/2018, SpO2 99 %. Body mass index is 43.08 kg/m. Physical Exam Vitals signs reviewed.  Constitutional:      Appearance: Normal appearance. She is obese.  Cardiovascular:     Rate and Rhythm: Normal rate.     Pulses: Normal pulses.  Pulmonary:     Effort: Pulmonary effort is normal.     Breath sounds: Normal breath sounds.  Musculoskeletal: Normal range of motion.  Skin:    General: Skin is warm and dry.  Neurological:     Mental Status: She is alert and oriented to person, place, and time.  Psychiatric:        Mood and Affect: Mood normal.        Behavior: Behavior normal.     RECENT LABS AND TESTS: BMET    Component Value Date/Time   NA 142 10/08/2018 1105   K 3.9 10/08/2018 1105   CL 102 10/08/2018 1105   CO2 24 10/08/2018 1105   GLUCOSE 102 (H) 10/08/2018 1105   GLUCOSE 89 05/16/2018 1747   BUN 8 10/08/2018 1105   CREATININE 0.86 10/08/2018 1105   CREATININE 0.68 03/28/2014 1632   CALCIUM 8.8 10/08/2018 1105   GFRNONAA 80 10/08/2018 1105   GFRAA 92 10/08/2018 1105   Lab Results  Component Value Date   HGBA1C 5.6 08/24/2018   HGBA1C 6.4 05/16/2018   HGBA1C 6.3 12/11/2017   HGBA1C 6.3 08/09/2017   HGBA1C 6.8 (H) 04/18/2017   Lab Results  Component Value Date   INSULIN 20.0 07/26/2018   CBC    Component Value Date/Time   WBC 8.9 09/01/2016 1142   RBC 4.39 09/01/2016 1142   HGB 13.2 09/01/2016 1142   HCT 39.3 09/01/2016 1142   PLT 313.0 09/01/2016 1142   MCV 89.6 09/01/2016 1142   MCH 28.0 06/21/2014 0309   MCHC 33.7 09/01/2016 1142   RDW 15.2 09/01/2016 1142   LYMPHSABS 2.4 09/01/2016 1142   MONOABS 0.2 09/01/2016 1142   EOSABS 0.2 09/01/2016 1142   BASOSABS 0.0 09/01/2016 1142   Iron/TIBC/Ferritin/ %Sat    Component Value Date/Time   IRON  62 05/30/2016 1307   FERRITIN 55.2 05/30/2016 1307   IRONPCTSAT 16.4 (L) 05/30/2016 1307   Lipid Panel     Component Value Date/Time   CHOL 127 05/16/2018 1747   CHOL 123 10/30/2012   TRIG 124.0 05/16/2018 1747   TRIG 124 10/30/2012   HDL 50.10 05/16/2018 1747   CHOLHDL 3 05/16/2018 1747   VLDL 24.8 05/16/2018 1747   LDLCALC 52 05/16/2018 1747   LDLCALC 59 10/30/2012   LDLDIRECT 73.0 04/18/2017 0847   Hepatic Function Panel     Component Value Date/Time   PROT 6.5 10/08/2018 1105   ALBUMIN 4.2 10/08/2018 1105   AST 12 10/08/2018 1105   ALT 12 10/08/2018 1105   ALKPHOS 95 10/08/2018 1105   BILITOT 0.2 10/08/2018 1105      Component Value Date/Time   TSH 2.42 05/16/2018 1747   TSH 1.80 04/07/2016 1632   TSH 2.351 03/28/2014 1632      OBESITY BEHAVIORAL INTERVENTION VISIT  Today's visit was # 7   Starting weight: 279 lbs Starting date: 07/26/18 Today's weight : 251 lbs  Today's date: 11/05/2018 Total lbs lost to date: 73    ASK: We discussed the diagnosis of obesity with Lisa Curry today and Lisa Curry agreed to give [place] permission to discuss obesity behavioral modification therapy today.  ASSESS: Kahleah has the diagnosis of obesity and her  BMI today is 43.06 [place] is in the action stage of change   ADVISE: Lisa Curry was educated on the multiple health risks of obesity as well as the benefit of weight loss to improve her health. She was advised of the need for long term treatment and the importance of lifestyle modifications to improve her current health and to decrease her risk of future health problems.  AGREE: Multiple dietary modification options and treatment options were discussed and  Lisa Curry agreed to follow the recommendations documented in the above note.  ARRANGE: Lisa Curry was educated on the importance of frequent visits to treat obesity as outlined per CMS and USPSTF guidelines and agreed to schedule her next follow up appointment today.  Wilhemena Durie, am  acting as Location manager for [employer], FNP-C.  I have reviewed the above documentation for accuracy and completeness, and I agree with the above.  - Brodric Schauer, FNP-C.

## 2018-11-07 ENCOUNTER — Encounter (INDEPENDENT_AMBULATORY_CARE_PROVIDER_SITE_OTHER): Payer: Self-pay | Admitting: Family Medicine

## 2018-11-07 DIAGNOSIS — F329 Major depressive disorder, single episode, unspecified: Secondary | ICD-10-CM | POA: Insufficient documentation

## 2018-11-07 DIAGNOSIS — F32A Depression, unspecified: Secondary | ICD-10-CM | POA: Insufficient documentation

## 2018-11-12 DIAGNOSIS — F411 Generalized anxiety disorder: Secondary | ICD-10-CM | POA: Diagnosis not present

## 2018-11-12 DIAGNOSIS — F339 Major depressive disorder, recurrent, unspecified: Secondary | ICD-10-CM | POA: Diagnosis not present

## 2018-11-19 DIAGNOSIS — F411 Generalized anxiety disorder: Secondary | ICD-10-CM | POA: Diagnosis not present

## 2018-11-19 DIAGNOSIS — F339 Major depressive disorder, recurrent, unspecified: Secondary | ICD-10-CM | POA: Diagnosis not present

## 2018-11-24 ENCOUNTER — Other Ambulatory Visit: Payer: Self-pay | Admitting: Family Medicine

## 2018-11-24 NOTE — Telephone Encounter (Signed)
Eprescribed.

## 2018-11-26 ENCOUNTER — Ambulatory Visit (INDEPENDENT_AMBULATORY_CARE_PROVIDER_SITE_OTHER): Payer: Self-pay | Admitting: Family Medicine

## 2018-11-28 ENCOUNTER — Ambulatory Visit (INDEPENDENT_AMBULATORY_CARE_PROVIDER_SITE_OTHER): Payer: BLUE CROSS/BLUE SHIELD | Admitting: Physician Assistant

## 2018-11-28 ENCOUNTER — Other Ambulatory Visit: Payer: Self-pay

## 2018-11-28 ENCOUNTER — Encounter (INDEPENDENT_AMBULATORY_CARE_PROVIDER_SITE_OTHER): Payer: Self-pay | Admitting: Physician Assistant

## 2018-11-28 VITALS — BP 121/72 | HR 81 | Temp 97.7°F | Ht 64.0 in | Wt 253.0 lb

## 2018-11-28 DIAGNOSIS — Z6841 Body Mass Index (BMI) 40.0 and over, adult: Secondary | ICD-10-CM

## 2018-11-28 DIAGNOSIS — E119 Type 2 diabetes mellitus without complications: Secondary | ICD-10-CM | POA: Diagnosis not present

## 2018-11-28 DIAGNOSIS — Z9189 Other specified personal risk factors, not elsewhere classified: Secondary | ICD-10-CM

## 2018-11-28 DIAGNOSIS — F339 Major depressive disorder, recurrent, unspecified: Secondary | ICD-10-CM | POA: Diagnosis not present

## 2018-11-28 DIAGNOSIS — E7849 Other hyperlipidemia: Secondary | ICD-10-CM

## 2018-11-28 DIAGNOSIS — F411 Generalized anxiety disorder: Secondary | ICD-10-CM | POA: Diagnosis not present

## 2018-11-28 NOTE — Progress Notes (Signed)
Office: [phone]  /  Fax: [phone]   HPI:   Chief Complaint: OBESITY Lisa Curry is here to discuss her progress with her obesity treatment plan. She is keeping a food journal with 1500-1600 calories and 100 grams of protein and is following her eating plan approximately 0% of the time. She states she is exercising 0 minutes 0 times per week. Lisa Curry reports that she has not been following the plan due to the stress of taking care of her grandmother. She also notes that her family is not supportive with her plan. Her weight is 253 lb (114.8 kg) today and has had a weight gain of 2 lbs since her last visit. She has lost 26 lbs since starting treatment with [place].  Diabetes II Lisa Curry has a diagnosis of diabetes type II and is taking Victoza, glimepiride, Actos, and Tradjenta. Lisa Curry states fasting blood sugars range between 128 and 153. Last A1c was 5.6 on 08/24/2018. She has been working on intensive lifestyle modifications including diet, exercise, and weight loss to help control her blood glucose levels.  Hyperlipidemia Lisa Curry has hyperlipidemia and is on atorvastatin. She has been trying to improve her cholesterol levels with intensive lifestyle modification including a low saturated fat diet, exercise and weight loss. She denies any chest pain.  At risk for cardiovascular disease Lisa Curry is at a higher than average risk for cardiovascular disease due to obesity. She currently denies any chest pain.  ASSESSMENT AND PLAN:  Type 2 diabetes mellitus without complication, without long-term current use of insulin (HCC) - Plan: Comprehensive metabolic panel, Hemoglobin A1c, Insulin, random  Other hyperlipidemia - Plan: Lipid Panel With LDL/HDL Ratio  At risk for heart disease  Class 3 severe obesity with serious comorbidity and body mass index (BMI) of 40.0 to 44.9 in adult, unspecified obesity type ([place])  PLAN:  Diabetes II Lisa Curry has been given extensive diabetes education by myself today including  ideal fasting and post-prandial blood glucose readings, individual ideal HgA1c goals  and hypoglycemia prevention. We discussed the importance of good blood sugar control to decrease the likelihood of diabetic complications such as nephropathy, neuropathy, limb loss, blindness, coronary artery disease, and death. We discussed the importance of intensive lifestyle modification including diet, exercise and weight loss as the first line treatment for diabetes. [name] to continue her diabetes medications and will follow-up at the agreed upon time. We will check A1c and insulin levels today.  Hyperlipidemia Lisa Curry was informed of the American Heart Association Guidelines emphasizing intensive lifestyle modifications as the first line treatment for hyperlipidemia. We discussed many lifestyle modifications today in depth, and Lisa Curry will continue to work on decreasing saturated fats such as fatty red meat, butter and many fried foods. She will also increase vegetables and lean protein in her diet and continue to work on exercise and weight loss efforts.  Cardiovascular risk counseling Lisa Curry was given extended (15 minutes) coronary artery disease prevention counseling today. She is 48 y.o. female and has risk factors for heart disease including obesity. We discussed intensive lifestyle modifications today with an emphasis on specific weight loss instructions and strategies. Pt was also informed of the importance of increasing exercise and decreasing saturated fats to help prevent heart disease.  Obesity Lisa Curry is currently in the action stage of change. As such, her goal is to continue with weight loss efforts. She has agreed to keep a food journal with 1500-1600 calories and 100 grams of protein daily. Lisa Curry has been instructed to work up to a goal  of 150 minutes of combined cardio and strengthening exercise per week for weight loss and overall health benefits. We discussed the following Behavioral Modification  Strategies today: decreasing simple carbohydrates, decrease eating out, and no skipping meals.  Lisa Curry has agreed to follow-up with our clinic in 2 weeks. She was informed of the importance of frequent follow-up visits to maximize her success with intensive lifestyle modifications for her multiple health conditions.  ALLERGIES: Allergies  Allergen Reactions  . Biaxin [Clarithromycin] Hives  . Metformin And Related Diarrhea    MEDICATIONS: Current Outpatient Medications on File Prior to Visit  Medication Sig Dispense Refill  . acetaminophen-codeine (TYLENOL #3) 300-30 MG tablet Take 1 tablet by mouth.    . Aspirin-Acetaminophen-Caffeine (EXCEDRIN PO) Take 1 capsule by mouth daily.    Marland Kitchen atorvastatin (LIPITOR) 20 MG tablet TAKE 1 TABLET (20 MG TOTAL) BY MOUTH DAILY AT 6 PM. 90 tablet 2  . buPROPion (WELLBUTRIN SR) 100 MG 12 hr tablet Take 1 tablet (100 mg total) by mouth 2 (two) times daily. 180 tablet 1  . Cholecalciferol (VITAMIN D3) 125 MCG (5000 UT) TABS Take 1 tablet by mouth daily.    . clonazePAM (KLONOPIN) 0.5 MG tablet TAKE 1 TABLET BY MOUTH TWICE A DAY AS NEEDED FOR ANXIETY 60 tablet 0  . cyclobenzaprine (FLEXERIL) 10 MG tablet PLEASE SEE ATTACHED FOR DETAILED DIRECTIONS 30 tablet 1  . dexlansoprazole (DEXILANT) 60 MG capsule TAKE 1 CAPSULE (60 MG TOTAL) BY MOUTH DAILY. 90 capsule 0  . dextromethorphan-guaiFENesin (MUCINEX DM) 30-600 MG 12hr tablet Take 1 tablet by mouth as needed for cough.    . fluticasone (FLONASE) 50 MCG/ACT nasal spray Place into both nostrils daily.    Marland Kitchen glimepiride (AMARYL) 4 MG tablet Take 1 tablet (4 mg total) by mouth 2 (two) times daily. 180 tablet 0  . glucose blood (ONE TOUCH ULTRA TEST) test strip Use to check sugar twice daily and as needed. Dx: E11.65 300 each 3  . ibuprofen (ADVIL,MOTRIN) 600 MG tablet Take 600 mg by mouth daily.    . Insulin Pen Needle 31G X 6 MM MISC Use once daily for Victoza SQ 100 each 0  . Lancet Devices (ONE TOUCH DELICA  LANCING DEV) MISC Use as directed with lancets and one touch meter E11.65 1 each 0  . liraglutide (VICTOZA) 18 MG/3ML SOPN Inject 0.1 mLs (0.6 mg total) into the skin daily. 2 pen 0  . meclizine (ANTIVERT) 12.5 MG tablet Take 12.5 mg by mouth 3 (three) times daily as needed for dizziness.    . Multiple Vitamin (MULTIVITAMIN) capsule Take 1 capsule by mouth daily.    Glory Rosebush DELICA LANCETS 58K MISC Check blood sugar twice daily and as directed. Dx E11.65 200 each 0  . pioglitazone (ACTOS) 30 MG tablet Take 30 mg by mouth daily.    Marland Kitchen POTASSIUM PO Take by mouth daily.    . ranitidine (ZANTAC) 150 MG tablet Take 150 mg by mouth at bedtime.    . topiramate (TOPAMAX) 50 MG tablet Take 1 tablet (50 mg total) by mouth daily. 30 tablet 0  . TRADJENTA 5 MG TABS tablet TAKE 1 TABLET BY MOUTH EVERY DAY 30 tablet 0  . Venlafaxine HCl 225 MG TB24 TAKE 1 TABLET (225 MG TOTAL) BY MOUTH DAILY. 90 tablet 1  . Vitamin D, Ergocalciferol, (DRISDOL) 1.25 MG (50000 UT) CAPS capsule Take 1 capsule (50,000 Units total) by mouth every 14 (fourteen) days. 2 capsule 0   Current Facility-Administered Medications on  File Prior to Visit  Medication Dose Route Frequency Provider Last Rate Last Dose  . diclofenac sodium (VOLTAREN) 1 % transdermal gel 2 g  2 g Topical QID Lisa Oto, MD        PAST MEDICAL HISTORY: Past Medical History:  Diagnosis Date  . Allergy   . Anemia   . Anxiety   . Barrett esophagus    Don Bulla, have not received EGD report  . Chronic back pain   . Constipation   . Depression with anxiety   . Diabetes mellitus without complication ([place])    DSME 04/2014  . Edema, lower extremity   . Fatty liver   . Gastric polyp 05/2012   h/o EGD  . GERD (gastroesophageal reflux disease)   . History of Helicobacter pylori infection   . HLD (hyperlipidemia)   . Hyperlipidemia   . Vitamin D deficiency     PAST SURGICAL HISTORY: Past Surgical History:  Procedure Laterality Date  . CESAREAN  SECTION  6/95, 4/98  . DILATION AND CURETTAGE OF UTERUS  2011  . ESOPHAGOGASTRODUODENOSCOPY  2012?   [facility]  . ESOPHAGOGASTRODUODENOSCOPY  10/2017   [facility]NL Ardis Hughs)  . LUMBAR DISC SURGERY  03/1995  . NASAL SINUS SURGERY  03/2001    SOCIAL HISTORY: Social History   Tobacco Use  . Smoking status: Never Smoker  . Smokeless tobacco: Never Used  Substance Use Topics  . Alcohol use: Yes    Alcohol/week: 0.0 standard drinks    Comment: occasional  . Drug use: No    FAMILY HISTORY: Family History  Problem Relation Age of Onset  . Breast cancer Maternal Grandmother 72  . Dementia Maternal Grandmother   . Heart disease Paternal Grandfather   . Other Sister        Postural Orthostatic Tachycardia Syndrome  . Supraventricular tachycardia Sister   . Other Maternal Aunt        hysterectomy for irregular bleed with abnormal pap  . Hyperlipidemia Mother   . Alzheimer's disease Maternal Grandfather    ROS: Review of Systems  Constitutional: Negative for weight loss.  Cardiovascular: Negative for chest pain.   PHYSICAL EXAM: Blood pressure 121/72, pulse 81, temperature 97.7 F (36.5 C), temperature source Oral, height 5\' 4"  (1.626 m), weight 253 lb (114.8 kg), SpO2 97 %. Body mass index is 43.43 kg/m. Physical Exam Vitals signs reviewed.  Constitutional:      Appearance: Normal appearance. She is obese.  Cardiovascular:     Rate and Rhythm: Normal rate.     Pulses: Normal pulses.  Pulmonary:     Effort: Pulmonary effort is normal.     Breath sounds: Normal breath sounds.  Musculoskeletal: Normal range of motion.  Skin:    General: Skin is warm and dry.  Neurological:     Mental Status: She is alert and oriented to person, place, and time.  Psychiatric:        Behavior: Behavior normal.   RECENT LABS AND TESTS: BMET    Component Value Date/Time   NA 142 10/08/2018 1105   K 3.9 10/08/2018 1105   CL 102 10/08/2018 1105   CO2 24 10/08/2018 1105    GLUCOSE 102 (H) 10/08/2018 1105   GLUCOSE 89 05/16/2018 1747   BUN 8 10/08/2018 1105   CREATININE 0.86 10/08/2018 1105   CREATININE 0.68 03/28/2014 1632   CALCIUM 8.8 10/08/2018 1105   GFRNONAA 80 10/08/2018 1105   GFRAA 92 10/08/2018 1105   Lab  Results  Component Value Date   HGBA1C 5.6 08/24/2018   HGBA1C 6.4 05/16/2018   HGBA1C 6.3 12/11/2017   HGBA1C 6.3 08/09/2017   HGBA1C 6.8 (H) 04/18/2017   Lab Results  Component Value Date   INSULIN 20.0 07/26/2018   CBC    Component Value Date/Time   WBC 8.9 09/01/2016 1142   RBC 4.39 09/01/2016 1142   HGB 13.2 09/01/2016 1142   HCT 39.3 09/01/2016 1142   PLT 313.0 09/01/2016 1142   MCV 89.6 09/01/2016 1142   MCH 28.0 06/21/2014 0309   MCHC 33.7 09/01/2016 1142   RDW 15.2 09/01/2016 1142   LYMPHSABS 2.4 09/01/2016 1142   MONOABS 0.2 09/01/2016 1142   EOSABS 0.2 09/01/2016 1142   BASOSABS 0.0 09/01/2016 1142   Iron/TIBC/Ferritin/ %Sat    Component Value Date/Time   IRON 62 05/30/2016 1307   FERRITIN 55.2 05/30/2016 1307   IRONPCTSAT 16.4 (L) 05/30/2016 1307   Lipid Panel     Component Value Date/Time   CHOL 127 05/16/2018 1747   CHOL 123 10/30/2012   TRIG 124.0 05/16/2018 1747   TRIG 124 10/30/2012   HDL 50.10 05/16/2018 1747   CHOLHDL 3 05/16/2018 1747   VLDL 24.8 05/16/2018 1747   LDLCALC 52 05/16/2018 1747   LDLCALC 59 10/30/2012   LDLDIRECT 73.0 04/18/2017 0847   Hepatic Function Panel     Component Value Date/Time   PROT 6.5 10/08/2018 1105   ALBUMIN 4.2 10/08/2018 1105   AST 12 10/08/2018 1105   ALT 12 10/08/2018 1105   ALKPHOS 95 10/08/2018 1105   BILITOT 0.2 10/08/2018 1105      Component Value Date/Time   TSH 2.42 05/16/2018 1747   TSH 1.80 04/07/2016 1632   TSH 2.351 03/28/2014 1632   Results for ELIZETH, WEINRICH (MRN [id number]) as of 11/28/2018 16:14  Ref. Range 09/25/2018 15:02  Vitamin D, 25-Hydroxy Latest Ref Range: 30.0 - 100.0 ng/mL 48.9   OBESITY BEHAVIORAL INTERVENTION VISIT   Today's visit was #11  Starting weight: 279 lbs Starting date: 07/26/2018 Today's weight: 253 lbs Today's date: 11/28/2018 Total lbs lost to date: 26    11/28/2018  Height 5\' 4"  (1.626 m)  Weight 253 lb (114.8 kg)  BMI (Calculated) 43.41  BLOOD PRESSURE - SYSTOLIC 332  BLOOD PRESSURE - DIASTOLIC 72   Body Fat % 95.1 %  Total Body Water (lbs) 95.4 lbs   ASK: We discussed the diagnosis of obesity with [name] today and [name] agreed to give [place] permission to discuss obesity behavioral modification therapy today.  ASSESS: Akemi has the diagnosis of obesity and her BMI today is 43.41. Klaudia is in the action stage of change.   ADVISE: Makenley was educated on the multiple health risks of obesity as well as the benefit of weight loss to improve her health. She was advised of the need for long term treatment and the importance of lifestyle modifications to improve her current health and to decrease her risk of future health problems.  AGREE: Multiple dietary modification options and treatment options were discussed and  Tamsin agreed to follow the recommendations documented in the above note.  ARRANGE: Lona was educated on the importance of frequent visits to treat obesity as outlined per CMS and USPSTF guidelines and agreed to schedule her next follow-up appointment today.  Migdalia Dk, am acting as transcriptionist for Abby Potash, PA-C I, Abby Potash, PA-C have reviewed above note and agree with its content

## 2018-11-29 LAB — COMPREHENSIVE METABOLIC PANEL
ALT: 17 IU/L (ref 0–32)
AST: 19 IU/L (ref 0–40)
Albumin/Globulin Ratio: 1.8 (ref 1.2–2.2)
Albumin: 4.3 g/dL (ref 3.8–4.8)
Alkaline Phosphatase: 99 IU/L (ref 39–117)
BUN/Creatinine Ratio: 12 (ref 9–23)
BUN: 9 mg/dL (ref 6–24)
Bilirubin Total: 0.3 mg/dL (ref 0.0–1.2)
CO2: 22 mmol/L (ref 20–29)
Calcium: 9.5 mg/dL (ref 8.7–10.2)
Chloride: 101 mmol/L (ref 96–106)
Creatinine, Ser: 0.76 mg/dL (ref 0.57–1.00)
GFR calc Af Amer: 107 mL/min/{1.73_m2} (ref >59)
GFR calc non Af Amer: 93 mL/min/{1.73_m2} (ref >59)
Globulin, Total: 2.4 g/dL (ref 1.5–4.5)
Glucose: 87 mg/dL (ref 65–99)
Potassium: 4.1 mmol/L (ref 3.5–5.2)
Sodium: 140 mmol/L (ref 134–144)
Total Protein: 6.7 g/dL (ref 6.0–8.5)

## 2018-11-29 LAB — INSULIN, RANDOM: INSULIN: 9.8 u[IU]/mL (ref 2.6–24.9)

## 2018-11-29 LAB — HEMOGLOBIN A1C
Est. average glucose Bld gHb Est-mCnc: 123 mg/dL
Hgb A1c MFr Bld: 5.9 % — ABNORMAL HIGH (ref 4.8–5.6)

## 2018-11-29 LAB — LIPID PANEL WITH LDL/HDL RATIO
Cholesterol, Total: 133 mg/dL (ref 100–199)
HDL: 46 mg/dL (ref >39)
LDL Calculated: 60 mg/dL (ref 0–99)
LDl/HDL Ratio: 1.3 ratio (ref 0.0–3.2)
Triglycerides: 136 mg/dL (ref 0–149)
VLDL Cholesterol Cal: 27 mg/dL (ref 5–40)

## 2018-12-01 ENCOUNTER — Other Ambulatory Visit: Payer: Self-pay | Admitting: Family Medicine

## 2018-12-07 DIAGNOSIS — F411 Generalized anxiety disorder: Secondary | ICD-10-CM | POA: Diagnosis not present

## 2018-12-07 DIAGNOSIS — F339 Major depressive disorder, recurrent, unspecified: Secondary | ICD-10-CM | POA: Diagnosis not present

## 2018-12-12 ENCOUNTER — Encounter (INDEPENDENT_AMBULATORY_CARE_PROVIDER_SITE_OTHER): Payer: Self-pay | Admitting: Physician Assistant

## 2018-12-12 ENCOUNTER — Encounter (INDEPENDENT_AMBULATORY_CARE_PROVIDER_SITE_OTHER): Payer: Self-pay

## 2018-12-19 ENCOUNTER — Ambulatory Visit (INDEPENDENT_AMBULATORY_CARE_PROVIDER_SITE_OTHER): Payer: Self-pay | Admitting: Physician Assistant

## 2018-12-27 ENCOUNTER — Other Ambulatory Visit: Payer: Self-pay | Admitting: Family Medicine

## 2018-12-27 NOTE — Telephone Encounter (Signed)
Are you filling pt's actos since pt sees, Abby Potash for DM?

## 2018-12-28 ENCOUNTER — Other Ambulatory Visit: Payer: Self-pay | Admitting: Family Medicine

## 2018-12-31 NOTE — Telephone Encounter (Signed)
Name of Medication: Clonazepam Name of Pharmacy: [facility] Last Fill or Written Date and Quantity: 11/24/18, #60 Last Office Visit and Type: 08/24/18, acute & f/u with Dr. Darnell Level Next Office Visit and Type: none with Dr. Darnell Level Last Controlled Substance Agreement Date: 04/24/17 Last UDS: 10/16/14

## 2018-12-31 NOTE — Telephone Encounter (Signed)
Eprescribed.

## 2019-01-10 ENCOUNTER — Ambulatory Visit (INDEPENDENT_AMBULATORY_CARE_PROVIDER_SITE_OTHER): Payer: BLUE CROSS/BLUE SHIELD | Admitting: Family Medicine

## 2019-01-10 ENCOUNTER — Other Ambulatory Visit: Payer: Self-pay

## 2019-01-10 ENCOUNTER — Encounter: Payer: Self-pay | Admitting: Family Medicine

## 2019-01-10 VITALS — Ht 64.0 in | Wt 260.0 lb

## 2019-01-10 DIAGNOSIS — M5441 Lumbago with sciatica, right side: Secondary | ICD-10-CM | POA: Diagnosis not present

## 2019-01-10 DIAGNOSIS — F331 Major depressive disorder, recurrent, moderate: Secondary | ICD-10-CM | POA: Diagnosis not present

## 2019-01-10 MED ORDER — ACETAMINOPHEN-CODEINE #3 300-30 MG PO TABS: 1.0000 | ORAL_TABLET | Freq: Three times a day (TID) | ORAL | 0 refills | Status: DC | PRN

## 2019-01-10 MED ORDER — BUPROPION HCL ER (SR) 150 MG PO TB12: 150.0000 mg | ORAL_TABLET | Freq: Two times a day (BID) | ORAL | 3 refills | Status: DC

## 2019-01-10 MED ORDER — CYCLOBENZAPRINE HCL 10 MG PO TABS: 10.0000 mg | ORAL_TABLET | Freq: Two times a day (BID) | ORAL | 1 refills | Status: DC | PRN

## 2019-01-10 NOTE — Assessment & Plan Note (Signed)
Deterioration with caregiver struggles. Will increase wellbutrin to 150mg  BID, continue effexor and klonopin at current dosing.  Pt will update me over next few weeks with effect.

## 2019-01-10 NOTE — Assessment & Plan Note (Signed)
Acute flare of chronic lower back pain after lifting grandmother at home in known h/o lumbar disc disease/radiculopathy by MRI and NCS 2019. No red flags. Supportive care reviewed - rec aleve, flexeril, Rx tylenol #3 for breakthrough pain. Update if not improving with treatment. Pt agrees with plan.

## 2019-01-10 NOTE — Progress Notes (Signed)
Virtual visit completed through [email or web address]. Due to national recommendations of social distancing due to [place] 19, a virtual visit is felt to be most appropriate for this patient at this time.   Patient location: home Provider location: [place] [facility], office If any vitals were documented, they were collected by patient at home unless specified below.    Ht 5\' 4"  (1.626 m)   Wt 260 lb (117.9 kg)   LMP 12/14/2018   BMI 44.63 kg/m    CC: back pain Subjective:    Patient ID: Lisa Curry, female    DOB: August 14, 1971, 48 y.o.   MRN: [id number]  HPI: Lisa Curry is a 48 y.o. female presenting on 01/10/2019 for Back Pain (C/o lower back pain, worse in right hip.  Had back surgery 03/1995. Pain is in L4/L5. Says she had to get grandmother off the floor for last 2 nights. Tried Flexeril 5 mg and 2 Aleve tabs nightly, helpful. )   DOI: 01/09/2019 She is looking after grandmother on a regular basis who is going through end of life process - hospice involved. Last night was 3rd night caring for grandmother. She fell on the floor yesterday morning. This morning grandmother fell again. Emmalou had to get her up by herself both times - 97 lbs. Struggling with being grandmother's caregiver.   No pain while lifting grandmother.  Lower lumbar pain into R buttock pain with radiation down posterior thigh.  No fevers/chills, numbness/weakness of legs, saddle anesthesia, bowel/bladder incontinence.   Treated last night at home with flexeril and aleve with benefit.  H/o lumbar disc surgery 1996. She continues effexor rand wellbutrin as well as clonazepam bid for mood.   Lumbar MRI 06/2018 IMPRESSION:  MRI lumbar spine (without) demonstrating: - L5-S1: disc bulging and facet hypertrophy with mild spinal stenosis and moderate biforaminal stenosis  - L3-4: disc bulging and facet hypertrophy with mild right and moderate left foraminal stenosis  - L1-2, L2-3, L4-5: disc bulging and facet hypertrophy with  mild biforaminal stenosis      Relevant past medical, surgical, family and social history reviewed and updated as indicated. Interim medical history since our last visit reviewed. Allergies and medications reviewed and updated. Outpatient Medications Prior to Visit  Medication Sig Dispense Refill  . Aspirin-Acetaminophen-Caffeine (EXCEDRIN PO) Take 1 capsule by mouth daily. As needed    . atorvastatin (LIPITOR) 20 MG tablet TAKE 1 TABLET (20 MG TOTAL) BY MOUTH DAILY AT 6 PM. 90 tablet 2  . Cholecalciferol (VITAMIN D3) 125 MCG (5000 UT) TABS Take 1 tablet by mouth daily.    . clonazePAM (KLONOPIN) 0.5 MG tablet TAKE 1 TABLET BY MOUTH TWICE A DAY AS NEEDED FOR ANXIETY 60 tablet 0  . dexlansoprazole (DEXILANT) 60 MG capsule TAKE 1 CAPSULE BY MOUTH EVERY DAY 90 capsule 1  . dextromethorphan-guaiFENesin (MUCINEX DM) 30-600 MG 12hr tablet Take 1 tablet by mouth as needed for cough.    . fluticasone (FLONASE) 50 MCG/ACT nasal spray Place into both nostrils daily.    Marland Kitchen glucose blood (ONE TOUCH ULTRA TEST) test strip Use to check sugar twice daily and as needed. Dx: E11.65 300 each 3  . ibuprofen (ADVIL,MOTRIN) 600 MG tablet Take 600 mg by mouth daily.    . Insulin Pen Needle 31G X 6 MM MISC Use once daily for Victoza SQ 100 each 0  . Lancet Devices (ONE TOUCH DELICA LANCING DEV) MISC Use as directed with lancets and one touch meter E11.65 1 each 0  .  liraglutide (VICTOZA) 18 MG/3ML SOPN Inject 0.1 mLs (0.6 mg total) into the skin daily. 2 pen 0  . meclizine (ANTIVERT) 12.5 MG tablet Take 12.5 mg by mouth 3 (three) times daily as needed for dizziness.    . Multiple Vitamin (MULTIVITAMIN) capsule Take 1 capsule by mouth daily.    . pioglitazone (ACTOS) 30 MG tablet TAKE 1 TABLET BY MOUTH EVERY DAY 90 tablet 1  . POTASSIUM PO Take by mouth daily.    . ranitidine (ZANTAC) 150 MG tablet Take 150 mg by mouth at bedtime.    . TRADJENTA 5 MG TABS tablet TAKE 1 TABLET BY MOUTH EVERY DAY 30 tablet 2  .  Venlafaxine HCl 225 MG TB24 TAKE 1 TABLET (225 MG TOTAL) BY MOUTH DAILY. 90 tablet 1  . buPROPion (WELLBUTRIN SR) 100 MG 12 hr tablet Take 1 tablet (100 mg total) by mouth 2 (two) times daily. 180 tablet 1  . cyclobenzaprine (FLEXERIL) 10 MG tablet PLEASE SEE ATTACHED FOR DETAILED DIRECTIONS 30 tablet 1  . ONETOUCH DELICA LANCETS 02D MISC Check blood sugar twice daily and as directed. Dx E11.65 200 each 0  . topiramate (TOPAMAX) 50 MG tablet Take 1 tablet (50 mg total) by mouth daily. (Patient not taking: Reported on 01/10/2019) 30 tablet 0  . acetaminophen-codeine (TYLENOL #3) 300-30 MG tablet Take 1 tablet by mouth.    Marland Kitchen glimepiride (AMARYL) 4 MG tablet Take 1 tablet (4 mg total) by mouth 2 (two) times daily. 180 tablet 0  . Vitamin D, Ergocalciferol, (DRISDOL) 1.25 MG (50000 UT) CAPS capsule Take 1 capsule (50,000 Units total) by mouth every 14 (fourteen) days. 2 capsule 0   Facility-Administered Medications Prior to Visit  Medication Dose Route Frequency Provider Last Rate Last Dose  . diclofenac sodium (VOLTAREN) 1 % transdermal gel 2 g  2 g Topical QID Jessy Oto, MD         Per HPI unless specifically indicated in ROS section below Review of Systems Objective:    Ht 5\' 4"  (1.626 m)   Wt 260 lb (117.9 kg)   LMP 12/14/2018   BMI 44.63 kg/m   Wt Readings from Last 3 Encounters:  01/10/19 260 lb (117.9 kg)  11/28/18 253 lb (114.8 kg)  11/05/18 251 lb (113.9 kg)     Physical exam: Gen: alert, NAD, not ill appearing Pulm: speaks in complete sentences without increased work of breathing Psych: normal mood, normal thought content       Assessment & Plan:   Problem List Items Addressed This Visit    MDD (major depressive disorder), recurrent episode, moderate ([place])    Deterioration with caregiver struggles. Will increase wellbutrin to 150mg  BID, continue effexor and klonopin at current dosing.  Pt will update me over next few weeks with effect.       Relevant Medications    buPROPion (WELLBUTRIN SR) 150 MG 12 hr tablet   Lower back pain - Primary    Acute flare of chronic lower back pain after lifting grandmother at home in known h/o lumbar disc disease/radiculopathy by MRI and NCS 2019. No red flags. Supportive care reviewed - rec aleve, flexeril, Rx tylenol #3 for breakthrough pain. Update if not improving with treatment. Pt agrees with plan.       Relevant Medications   acetaminophen-codeine (TYLENOL #3) 300-30 MG tablet   cyclobenzaprine (FLEXERIL) 10 MG tablet       Meds ordered this encounter  Medications  . acetaminophen-codeine (TYLENOL #3) 300-30 MG tablet  Sig: Take 1 tablet by mouth every 8 (eight) hours as needed for moderate pain.    Dispense:  15 tablet    Refill:  0  . cyclobenzaprine (FLEXERIL) 10 MG tablet    Sig: Take 1 tablet (10 mg total) by mouth 2 (two) times daily as needed for muscle spasms (sedation precautions).    Dispense:  30 tablet    Refill:  1  . buPROPion (WELLBUTRIN SR) 150 MG 12 hr tablet    Sig: Take 1 tablet (150 mg total) by mouth 2 (two) times daily.    Dispense:  60 tablet    Refill:  3   No orders of the defined types were placed in this encounter.   Follow up plan: No follow-ups on file.  Ria Bush, MD

## 2019-01-11 DIAGNOSIS — F339 Major depressive disorder, recurrent, unspecified: Secondary | ICD-10-CM | POA: Diagnosis not present

## 2019-01-11 DIAGNOSIS — F411 Generalized anxiety disorder: Secondary | ICD-10-CM | POA: Diagnosis not present

## 2019-01-14 ENCOUNTER — Encounter (INDEPENDENT_AMBULATORY_CARE_PROVIDER_SITE_OTHER): Payer: Self-pay | Admitting: Bariatrics

## 2019-01-14 ENCOUNTER — Ambulatory Visit (INDEPENDENT_AMBULATORY_CARE_PROVIDER_SITE_OTHER): Payer: BLUE CROSS/BLUE SHIELD | Admitting: Bariatrics

## 2019-01-14 ENCOUNTER — Other Ambulatory Visit: Payer: Self-pay

## 2019-01-14 DIAGNOSIS — Z6841 Body Mass Index (BMI) 40.0 and over, adult: Secondary | ICD-10-CM

## 2019-01-14 DIAGNOSIS — E7849 Other hyperlipidemia: Secondary | ICD-10-CM | POA: Diagnosis not present

## 2019-01-14 DIAGNOSIS — E66813 Obesity, class 3: Secondary | ICD-10-CM

## 2019-01-14 DIAGNOSIS — E119 Type 2 diabetes mellitus without complications: Secondary | ICD-10-CM | POA: Diagnosis not present

## 2019-01-14 NOTE — Progress Notes (Signed)
Office: [phone]  /  Fax: [phone] TeleHealth Visit:  Lisa Curry has verbally consented to this TeleHealth visit today. The patient is located at home, the provider is located at the [employer] [facility] office. The participants in this visit include the listed provider and patient and any and all parties involved. The visit was conducted today via FaceTime.  HPI:   Chief Complaint: OBESITY Lisa Curry is here to discuss her progress with her obesity treatment plan. She was instructed to follow the keep a food journal with 1500 to 1600 calories and 100 grams of protein daily plan and is following her eating plan approximately 0 % of the time. Patient states she is following the "see food" plan. She states she is exercising 0 minutes 0 times per week. Lisa Curry has not been seen by Abby Potash, PA-C on 11/28/18 and by me on 10/23/18. 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 We were unable to weigh the patient today for this TeleHealth visit. She feels as if she has gained weight since her last visit. She has lost 16 lbs since starting treatment with [place].  Diabetes II Lisa Curry has a diagnosis of diabetes type II. Lisa Curry states fasting BGs range om the 120's, 130's (taking Victoza) and she denies any hypoglycemic episodes. Last A1c was at 5.7 and last insulin level was at 9.8 She has been working on intensive lifestyle modifications including diet, exercise, and weight loss to help control her blood glucose levels.  Hyperlipidemia Lisa Curry has hyperlipidemia and she is taking Lipitor. She has been trying to improve her cholesterol levels with intensive lifestyle modification including a low saturated fat diet, exercise and weight loss. She denies myalgias.  ASSESSMENT AND PLAN:  Type 2 diabetes mellitus without complication, without long-term current use of insulin (HCC) - Plan: Insulin Pen Needle 31G X 6 MM MISC  Other hyperlipidemia  Class 3 severe obesity with serious comorbidity and body  mass index (BMI) of 40.0 to 44.9 in adult, unspecified obesity type ([place])  PLAN:  Diabetes II Lisa Curry has been given extensive diabetes education by myself today including ideal fasting and post-prandial blood glucose readings, individual ideal Hgb A1c goals and hypoglycemia prevention. We discussed the importance of good blood sugar control to decrease the likelihood of diabetic complications such as nephropathy, neuropathy, limb loss, blindness, coronary artery disease, and death. We discussed the importance of intensive lifestyle modification including diet, exercise and weight loss as the first line treatment for diabetes. Lisa Curry agrees to continue her diabetes medications and prescription was written today for Insulin pen needles 31 G x 6 mm (for Victoza subQ) #100 with no refills. Kamirah agreed to follow up at the agreed upon time.  Hyperlipidemia Lisa Curry was informed of the American Heart Association Guidelines emphasizing intensive lifestyle modifications as the first line treatment for hyperlipidemia. We discussed many lifestyle modifications today in depth, and Lisa Curry will continue to work on decreasing saturated fats such as fatty red meat, butter and many fried foods. She will also increase vegetables and lean protein in her diet and continue to work on exercise and weight loss efforts. Lisa Curry agreed to continue Lipitor and follow up as directed.  Obesity Lisa Curry is currently in the action stage of change. As such, her goal is to continue with weight loss efforts She has agreed to keep a food journal with 1500 to 1600 calories and 100 grams of protein daily Lisa Curry has been instructed to work up to a goal of 150 minutes of  combined cardio and strengthening exercise per week for weight loss and overall health benefits. We discussed the following Behavioral Modification Strategies today: planning for success, increase H2O intake, no skipping meals, keeping healthy foods in the home, better snacking choices,  increasing lean protein intake, decreasing simple carbohydrates, increasing vegetables, decrease eating out, work on meal planning and easy cooking plans, emotional eating strategies, ways to avoid boredom eating and ways to avoid night time snacking [name] will weigh herself at home before each visit. She will take foods to her grandmother's house. We discussed various medication options to help Lisa Curry with her weight loss efforts and we both agreed to stop Topamax.  Lisa Curry will call for her next appointment. She was informed of the importance of frequent follow up visits to maximize her success with intensive lifestyle modifications for her multiple health conditions.  ALLERGIES: Allergies  Allergen Reactions  . Biaxin [Clarithromycin] Hives  . Metformin And Related Diarrhea    MEDICATIONS: Current Outpatient Medications on File Prior to Visit  Medication Sig Dispense Refill  . acetaminophen-codeine (TYLENOL #3) 300-30 MG tablet Take 1 tablet by mouth every 8 (eight) hours as needed for moderate pain. 15 tablet 0  . Aspirin-Acetaminophen-Caffeine (EXCEDRIN PO) Take 1 capsule by mouth daily. As needed    . atorvastatin (LIPITOR) 20 MG tablet TAKE 1 TABLET (20 MG TOTAL) BY MOUTH DAILY AT 6 PM. 90 tablet 2  . buPROPion (WELLBUTRIN SR) 150 MG 12 hr tablet Take 1 tablet (150 mg total) by mouth 2 (two) times daily. 60 tablet 3  . Cholecalciferol (VITAMIN D3) 125 MCG (5000 UT) TABS Take 1 tablet by mouth daily.    . clonazePAM (KLONOPIN) 0.5 MG tablet TAKE 1 TABLET BY MOUTH TWICE A DAY AS NEEDED FOR ANXIETY 60 tablet 0  . cyclobenzaprine (FLEXERIL) 10 MG tablet Take 1 tablet (10 mg total) by mouth 2 (two) times daily as needed for muscle spasms (sedation precautions). 30 tablet 1  . dexlansoprazole (DEXILANT) 60 MG capsule TAKE 1 CAPSULE BY MOUTH EVERY DAY 90 capsule 1  . dextromethorphan-guaiFENesin (MUCINEX DM) 30-600 MG 12hr tablet Take 1 tablet by mouth as needed for cough.    . fluticasone  (FLONASE) 50 MCG/ACT nasal spray Place into both nostrils daily.    Marland Kitchen glucose blood (ONE TOUCH ULTRA TEST) test strip Use to check sugar twice daily and as needed. Dx: E11.65 300 each 3  . ibuprofen (ADVIL,MOTRIN) 600 MG tablet Take 600 mg by mouth daily.    . Insulin Pen Needle 31G X 6 MM MISC Use once daily for Victoza SQ 100 each 0  . Lancet Devices (ONE TOUCH DELICA LANCING DEV) MISC Use as directed with lancets and one touch meter E11.65 1 each 0  . liraglutide (VICTOZA) 18 MG/3ML SOPN Inject 0.1 mLs (0.6 mg total) into the skin daily. 2 pen 0  . meclizine (ANTIVERT) 12.5 MG tablet Take 12.5 mg by mouth 3 (three) times daily as needed for dizziness.    . Multiple Vitamin (MULTIVITAMIN) capsule Take 1 capsule by mouth daily.    Glory Rosebush DELICA LANCETS 25K MISC Check blood sugar twice daily and as directed. Dx E11.65 200 each 0  . pioglitazone (ACTOS) 30 MG tablet TAKE 1 TABLET BY MOUTH EVERY DAY 90 tablet 1  . POTASSIUM PO Take by mouth daily.    . ranitidine (ZANTAC) 150 MG tablet Take 150 mg by mouth at bedtime.    . TRADJENTA 5 MG TABS tablet TAKE 1 TABLET BY MOUTH  EVERY DAY 30 tablet 2  . Venlafaxine HCl 225 MG TB24 TAKE 1 TABLET (225 MG TOTAL) BY MOUTH DAILY. 90 tablet 1   Current Facility-Administered Medications on File Prior to Visit  Medication Dose Route Frequency Provider Last Rate Last Dose  . diclofenac sodium (VOLTAREN) 1 % transdermal gel 2 g  2 g Topical QID Jessy Oto, MD        PAST MEDICAL HISTORY: Past Medical History:  Diagnosis Date  . Allergy   . Anemia   . Anxiety   . Barrett esophagus    Don Bulla, have not received EGD report  . Chronic back pain   . Constipation   . Depression with anxiety   . Diabetes mellitus without complication ([place])    DSME 04/2014  . Edema, lower extremity   . Fatty liver   . Gastric polyp 05/2012   h/o EGD  . GERD (gastroesophageal reflux disease)   . History of Helicobacter pylori infection   . HLD (hyperlipidemia)    . Hyperlipidemia   . Vitamin D deficiency     PAST SURGICAL HISTORY: Past Surgical History:  Procedure Laterality Date  . CESAREAN SECTION  6/95, 4/98  . DILATION AND CURETTAGE OF UTERUS  2011  . ESOPHAGOGASTRODUODENOSCOPY  2012?   [facility] Lango [facility]  . ESOPHAGOGASTRODUODENOSCOPY  10/2017   WNL Ardis Hughs)  . LUMBAR DISC SURGERY  03/1995  . NASAL SINUS SURGERY  03/2001    SOCIAL HISTORY: Social History   Tobacco Use  . Smoking status: Never Smoker  . Smokeless tobacco: Never Used  Substance Use Topics  . Alcohol use: Yes    Alcohol/week: 0.0 standard drinks    Comment: occasional  . Drug use: No    FAMILY HISTORY: Family History  Problem Relation Age of Onset  . Breast cancer Maternal Grandmother 63  . Dementia Maternal Grandmother   . Heart disease Paternal Grandfather   . Other Sister        Postural Orthostatic Tachycardia Syndrome  . Supraventricular tachycardia Sister   . Other Maternal Aunt        hysterectomy for irregular bleed with abnormal pap  . Hyperlipidemia Mother   . Alzheimer's disease Maternal Grandfather     ROS: Review of Systems  Constitutional: Negative for weight loss.  Musculoskeletal: Negative for myalgias.  Endo/Heme/Allergies:       Negative for hypoglycemia    PHYSICAL EXAM: Pt in no acute distress  RECENT LABS AND TESTS: BMET    Component Value Date/Time   NA 140 11/28/2018 1550   K 4.1 11/28/2018 1550   CL 101 11/28/2018 1550   CO2 22 11/28/2018 1550   GLUCOSE 87 11/28/2018 1550   GLUCOSE 89 05/16/2018 1747   BUN 9 11/28/2018 1550   CREATININE 0.76 11/28/2018 1550   CREATININE 0.68 03/28/2014 1632   CALCIUM 9.5 11/28/2018 1550   GFRNONAA 93 11/28/2018 1550   GFRAA 107 11/28/2018 1550   Lab Results  Component Value Date   HGBA1C 5.9 (H) 11/28/2018   HGBA1C 5.6 08/24/2018   HGBA1C 6.4 05/16/2018   HGBA1C 6.3 12/11/2017   HGBA1C 6.3 08/09/2017   Lab Results  Component Value Date   INSULIN 9.8  11/28/2018   INSULIN 20.0 07/26/2018   CBC    Component Value Date/Time   WBC 8.9 09/01/2016 1142   RBC 4.39 09/01/2016 1142   HGB 13.2 09/01/2016 1142   HCT 39.3 09/01/2016 1142   PLT 313.0 09/01/2016 1142  MCV 89.6 09/01/2016 1142   MCH 28.0 06/21/2014 0309   MCHC 33.7 09/01/2016 1142   RDW 15.2 09/01/2016 1142   LYMPHSABS 2.4 09/01/2016 1142   MONOABS 0.2 09/01/2016 1142   EOSABS 0.2 09/01/2016 1142   BASOSABS 0.0 09/01/2016 1142   Iron/TIBC/Ferritin/ %Sat    Component Value Date/Time   IRON 62 05/30/2016 1307   FERRITIN 55.2 05/30/2016 1307   IRONPCTSAT 16.4 (L) 05/30/2016 1307   Lipid Panel     Component Value Date/Time   CHOL 133 11/28/2018 1550   CHOL 123 10/30/2012   TRIG 136 11/28/2018 1550   TRIG 124 10/30/2012   HDL 46 11/28/2018 1550   CHOLHDL 3 05/16/2018 1747   VLDL 24.8 05/16/2018 1747   LDLCALC 60 11/28/2018 1550   LDLCALC 59 10/30/2012   LDLDIRECT 73.0 04/18/2017 0847   Hepatic Function Panel     Component Value Date/Time   PROT 6.7 11/28/2018 1550   ALBUMIN 4.3 11/28/2018 1550   AST 19 11/28/2018 1550   ALT 17 11/28/2018 1550   ALKPHOS 99 11/28/2018 1550   BILITOT 0.3 11/28/2018 1550      Component Value Date/Time   TSH 2.42 05/16/2018 1747   TSH 1.80 04/07/2016 1632   TSH 2.351 03/28/2014 1632   Results for JAIDON, SPONSEL (MRN [id number]) as of 01/14/2019 14:43  Ref. Range 09/25/2018 15:02  Vitamin D, 25-Hydroxy Latest Ref Range: 30.0 - 100.0 ng/mL 48.9    I, Doreene Nest, am acting as Location manager for [employer]. Owens Shark, DO  I have reviewed the above documentation for accuracy and completeness, and I agree with the above. -Jearld Lesch, DO

## 2019-01-15 MED ORDER — INSULIN PEN NEEDLE 31G X 6 MM MISC: 0 refills | Status: DC

## 2019-01-23 DIAGNOSIS — F411 Generalized anxiety disorder: Secondary | ICD-10-CM | POA: Diagnosis not present

## 2019-01-23 DIAGNOSIS — F339 Major depressive disorder, recurrent, unspecified: Secondary | ICD-10-CM | POA: Diagnosis not present

## 2019-02-04 ENCOUNTER — Other Ambulatory Visit: Payer: Self-pay | Admitting: Family Medicine

## 2019-02-04 DIAGNOSIS — F411 Generalized anxiety disorder: Secondary | ICD-10-CM | POA: Diagnosis not present

## 2019-02-04 DIAGNOSIS — F339 Major depressive disorder, recurrent, unspecified: Secondary | ICD-10-CM | POA: Diagnosis not present

## 2019-02-04 NOTE — Telephone Encounter (Signed)
Last office visit 01/10/2019 for back pain.  Last refilled 12/31/2018 for #60 with no refills.  UDS/Contract 10/16/2014.  No future appointments.

## 2019-02-06 NOTE — Telephone Encounter (Signed)
Eprescribed.

## 2019-02-07 ENCOUNTER — Other Ambulatory Visit: Payer: Self-pay | Admitting: Family Medicine

## 2019-02-07 NOTE — Telephone Encounter (Signed)
E-scribed 90-day rx. 

## 2019-02-12 DIAGNOSIS — F339 Major depressive disorder, recurrent, unspecified: Secondary | ICD-10-CM | POA: Diagnosis not present

## 2019-02-12 DIAGNOSIS — F411 Generalized anxiety disorder: Secondary | ICD-10-CM | POA: Diagnosis not present

## 2019-02-14 DIAGNOSIS — H04123 Dry eye syndrome of bilateral lacrimal glands: Secondary | ICD-10-CM | POA: Diagnosis not present

## 2019-02-14 DIAGNOSIS — E119 Type 2 diabetes mellitus without complications: Secondary | ICD-10-CM | POA: Diagnosis not present

## 2019-02-18 DIAGNOSIS — F339 Major depressive disorder, recurrent, unspecified: Secondary | ICD-10-CM | POA: Diagnosis not present

## 2019-02-18 DIAGNOSIS — F411 Generalized anxiety disorder: Secondary | ICD-10-CM | POA: Diagnosis not present

## 2019-02-25 DIAGNOSIS — F411 Generalized anxiety disorder: Secondary | ICD-10-CM | POA: Diagnosis not present

## 2019-02-25 DIAGNOSIS — F339 Major depressive disorder, recurrent, unspecified: Secondary | ICD-10-CM | POA: Diagnosis not present

## 2019-02-28 ENCOUNTER — Other Ambulatory Visit: Payer: Self-pay | Admitting: Family Medicine

## 2019-03-04 DIAGNOSIS — F411 Generalized anxiety disorder: Secondary | ICD-10-CM | POA: Diagnosis not present

## 2019-03-04 DIAGNOSIS — F339 Major depressive disorder, recurrent, unspecified: Secondary | ICD-10-CM | POA: Diagnosis not present

## 2019-03-11 ENCOUNTER — Encounter (INDEPENDENT_AMBULATORY_CARE_PROVIDER_SITE_OTHER): Payer: Self-pay | Admitting: Physician Assistant

## 2019-03-11 ENCOUNTER — Other Ambulatory Visit: Payer: Self-pay

## 2019-03-11 ENCOUNTER — Telehealth (INDEPENDENT_AMBULATORY_CARE_PROVIDER_SITE_OTHER): Payer: BC Managed Care – PPO | Admitting: Physician Assistant

## 2019-03-11 DIAGNOSIS — Z6841 Body Mass Index (BMI) 40.0 and over, adult: Secondary | ICD-10-CM

## 2019-03-11 DIAGNOSIS — E119 Type 2 diabetes mellitus without complications: Secondary | ICD-10-CM

## 2019-03-11 DIAGNOSIS — F411 Generalized anxiety disorder: Secondary | ICD-10-CM | POA: Diagnosis not present

## 2019-03-11 DIAGNOSIS — K5909 Other constipation: Secondary | ICD-10-CM

## 2019-03-11 DIAGNOSIS — F339 Major depressive disorder, recurrent, unspecified: Secondary | ICD-10-CM | POA: Diagnosis not present

## 2019-03-11 MED ORDER — DOCUSATE SODIUM 100 MG PO CAPS: 100.0000 mg | ORAL_CAPSULE | Freq: Two times a day (BID) | ORAL | 0 refills | Status: DC

## 2019-03-13 NOTE — Progress Notes (Signed)
Office: [phone]  /  Fax: [phone] TeleHealth Visit:  Lisa Curry has verbally consented to this TeleHealth visit today. The patient is located at home, the provider is located at the [employer] [facility] office. The participants in this visit include the listed provider and patient. The visit was conducted today via FaceTime.  HPI:   Chief Complaint: OBESITY Lisa Curry is here to discuss her progress with her obesity treatment plan. She is keeping a food journal with 1500-1600 calories and 100 grams of protein and is following her eating plan approximately 1% of the time. She states she is exercising 0 minutes 0 times per week. Lisa Curry states her most recent weight was 266 lbs. She does not feel like she can follow a structured plan and does not want to journal. We were unable to weigh the patient today for this TeleHealth visit. She feels as if she has gained weight since her last visit. She has lost 26 lbs since starting treatment with [place].  Diabetes II Lisa Curry has a diagnosis of diabetes type II and is on Victoza and Tradjenta. She denies hypoglycemia. Last A1c was 5.9 on 11/28/2018. She has been working on intensive lifestyle modifications including diet, exercise, and weight loss to help control her blood glucose levels. No polyphagia.  Constipation Lisa Curry is on PRN MiraLax and states that she is getting enough water and trying to eat foods with a lot of fiber.  ASSESSMENT AND PLAN:  Other constipation - Plan: docusate sodium (COLACE) 100 MG capsule  PLAN:  Diabetes II Lisa Curry has been given extensive diabetes education by myself today including ideal fasting and post-prandial blood glucose readings, individual ideal HgA1c goals  and hypoglycemia prevention. We discussed the importance of good blood sugar control to decrease the likelihood of diabetic complications such as nephropathy, neuropathy, limb loss, blindness, coronary artery disease, and death. We discussed the importance of  intensive lifestyle modification including diet, exercise and weight loss as the first line treatment for diabetes. Lisa Curry agrees to continue her diabetes medications and will follow-up at the agreed upon time.  Constipation Lisa Curry was informed decrease bowel movement frequency is normal while losing weight, but stools should not be hard or painful. She was advised to increase her H20 intake and work on increasing her fiber intake. High fiber foods were discussed today. Lisa Curry will start Colace 100 mg 1 PO BID #60 with 0 refills and agrees to follow-up with our clinic in 2 weeks.  Obesity Lisa Curry is currently in the action stage of change. As such, her goal is to continue with weight loss efforts. She has agreed to change to better portion control and make smarter food choices, such as increase vegetables and decrease simple carbohydrates. Lisa Curry has been instructed to work up to a goal of 150 minutes of combined cardio and strengthening exercise per week for weight loss and overall health benefits. We discussed the following Behavioral Modification Strategies today: work on meal planning, easy cooking plans, and keeping healthy foods in the home.  Lisa Curry has agreed to follow-up with our clinic in 2 weeks. She was informed of the importance of frequent follow-up visits to maximize her success with intensive lifestyle modifications for her multiple health conditions.  ALLERGIES: Allergies  Allergen Reactions   Biaxin [Clarithromycin] Hives   Metformin And Related Diarrhea    MEDICATIONS: Current Outpatient Medications on File Prior to Visit  Medication Sig Dispense Refill   acetaminophen-codeine (TYLENOL #3) 300-30 MG tablet Take 1 tablet by mouth every  8 (eight) hours as needed for moderate pain. 15 tablet 0   Aspirin-Acetaminophen-Caffeine (EXCEDRIN PO) Take 1 capsule by mouth daily. As needed     atorvastatin (LIPITOR) 20 MG tablet TAKE 1 TABLET (20 MG TOTAL) BY MOUTH DAILY AT 6 PM. 90 tablet 2     buPROPion (WELLBUTRIN SR) 150 MG 12 hr tablet TAKE 1 TABLET BY MOUTH TWICE A DAY 180 tablet 0   Cholecalciferol (VITAMIN D3) 125 MCG (5000 UT) TABS Take 1 tablet by mouth daily.     clonazePAM (KLONOPIN) 0.5 MG tablet TAKE 1 TABLET BY MOUTH TWICE A DAY AS NEEDED FOR ANXIETY 60 tablet 0   cyclobenzaprine (FLEXERIL) 10 MG tablet Take 1 tablet (10 mg total) by mouth 2 (two) times daily as needed for muscle spasms (sedation precautions). 30 tablet 1   dexlansoprazole (DEXILANT) 60 MG capsule TAKE 1 CAPSULE BY MOUTH EVERY DAY 90 capsule 1   dextromethorphan-guaiFENesin (MUCINEX DM) 30-600 MG 12hr tablet Take 1 tablet by mouth as needed for cough.     fluticasone (FLONASE) 50 MCG/ACT nasal spray Place into both nostrils daily.     glucose blood (ONE TOUCH ULTRA TEST) test strip Use to check sugar twice daily and as needed. Dx: E11.65 300 each 3   ibuprofen (ADVIL,MOTRIN) 600 MG tablet Take 600 mg by mouth daily.     Insulin Pen Needle 31G X 6 MM MISC Use once daily for Victoza SQ 100 each 0   Lancet Devices (ONE TOUCH DELICA LANCING DEV) MISC Use as directed with lancets and one touch meter E11.65 1 each 0   liraglutide (VICTOZA) 18 MG/3ML SOPN Inject 0.1 mLs (0.6 mg total) into the skin daily. 2 pen 0   magnesium oxide (MAG-OX) 400 MG tablet Take 400 mg by mouth daily.     meclizine (ANTIVERT) 12.5 MG tablet Take 12.5 mg by mouth 3 (three) times daily as needed for dizziness.     Multiple Vitamin (MULTIVITAMIN) capsule Take 1 capsule by mouth daily.     ONETOUCH DELICA LANCETS 20N MISC Check blood sugar twice daily and as directed. Dx E11.65 200 each 0   pioglitazone (ACTOS) 30 MG tablet TAKE 1 TABLET BY MOUTH EVERY DAY 90 tablet 1   TRADJENTA 5 MG TABS tablet TAKE 1 TABLET BY MOUTH EVERY DAY 30 tablet 5   Venlafaxine HCl 225 MG TB24 TAKE 1 TABLET (225 MG TOTAL) BY MOUTH DAILY. 90 tablet 1   POTASSIUM PO Take by mouth daily.     ranitidine (ZANTAC) 150 MG tablet Take 150 mg by  mouth at bedtime.     Current Facility-Administered Medications on File Prior to Visit  Medication Dose Route Frequency Provider Last Rate Last Dose   diclofenac sodium (VOLTAREN) 1 % transdermal gel 2 g  2 g Topical QID Jessy Oto, MD        PAST MEDICAL HISTORY: Past Medical History:  Diagnosis Date   Allergy    Anemia    Anxiety    Barrett esophagus    Don Bulla, have not received EGD report   Chronic back pain    Constipation    Depression with anxiety    Diabetes mellitus without complication ([place])    DSME 04/2014   Edema, lower extremity    Fatty liver    Gastric polyp 05/2012   h/o EGD   GERD (gastroesophageal reflux disease)    History of Helicobacter pylori infection    HLD (hyperlipidemia)    Hyperlipidemia  Vitamin D deficiency     PAST SURGICAL HISTORY: Past Surgical History:  Procedure Laterality Date   CESAREAN SECTION  6/95, 4/98   DILATION AND CURETTAGE OF UTERUS  2011   ESOPHAGOGASTRODUODENOSCOPY  2012?   Ferdinand Lango at [facility]   ESOPHAGOGASTRODUODENOSCOPY  10/2017   WNL Ardis Hughs)   [place] SURGERY  03/1995   NASAL SINUS SURGERY  03/2001    SOCIAL HISTORY: Social History   Tobacco Use   Smoking status: Never Smoker   Smokeless tobacco: Never Used  Substance Use Topics   Alcohol use: Yes    Alcohol/week: 0.0 standard drinks    Comment: occasional   Drug use: No    FAMILY HISTORY: Family History  Problem Relation Age of Onset   Breast cancer Maternal Grandmother 74   Dementia Maternal Grandmother    Heart disease Paternal Grandfather    Other Sister        Postural Orthostatic Tachycardia Syndrome   Supraventricular tachycardia Sister    Other Maternal Aunt        hysterectomy for irregular bleed with abnormal pap   Hyperlipidemia Mother    Alzheimer's disease Maternal Grandfather    ROS: Review of Systems  Gastrointestinal: Positive for constipation.  Endo/Heme/Allergies:        Negative for hypoglycemia. Negative for polyphagia.   PHYSICAL EXAM: Pt in no acute distress  RECENT LABS AND TESTS: BMET    Component Value Date/Time   NA 140 11/28/2018 1550   K 4.1 11/28/2018 1550   CL 101 11/28/2018 1550   CO2 22 11/28/2018 1550   GLUCOSE 87 11/28/2018 1550   GLUCOSE 89 05/16/2018 1747   BUN 9 11/28/2018 1550   CREATININE 0.76 11/28/2018 1550   CREATININE 0.68 03/28/2014 1632   CALCIUM 9.5 11/28/2018 1550   GFRNONAA 93 11/28/2018 1550   GFRAA 107 11/28/2018 1550   Lab Results  Component Value Date   HGBA1C 5.9 (H) 11/28/2018   HGBA1C 5.6 08/24/2018   HGBA1C 6.4 05/16/2018   HGBA1C 6.3 12/11/2017   HGBA1C 6.3 08/09/2017   Lab Results  Component Value Date   INSULIN 9.8 11/28/2018   INSULIN 20.0 07/26/2018   CBC    Component Value Date/Time   WBC 8.9 09/01/2016 1142   RBC 4.39 09/01/2016 1142   HGB 13.2 09/01/2016 1142   HCT 39.3 09/01/2016 1142   PLT 313.0 09/01/2016 1142   MCV 89.6 09/01/2016 1142   MCH 28.0 06/21/2014 0309   MCHC 33.7 09/01/2016 1142   RDW 15.2 09/01/2016 1142   LYMPHSABS 2.4 09/01/2016 1142   MONOABS 0.2 09/01/2016 1142   EOSABS 0.2 09/01/2016 1142   BASOSABS 0.0 09/01/2016 1142   Iron/TIBC/Ferritin/ %Sat    Component Value Date/Time   IRON 62 05/30/2016 1307   FERRITIN 55.2 05/30/2016 1307   IRONPCTSAT 16.4 (L) 05/30/2016 1307   Lipid Panel     Component Value Date/Time   CHOL 133 11/28/2018 1550   CHOL 123 10/30/2012   TRIG 136 11/28/2018 1550   TRIG 124 10/30/2012   HDL 46 11/28/2018 1550   CHOLHDL 3 05/16/2018 1747   VLDL 24.8 05/16/2018 1747   LDLCALC 60 11/28/2018 1550   LDLCALC 59 10/30/2012   LDLDIRECT 73.0 04/18/2017 0847   Hepatic Function Panel     Component Value Date/Time   PROT 6.7 11/28/2018 1550   ALBUMIN 4.3 11/28/2018 1550   AST 19 11/28/2018 1550   ALT 17 11/28/2018 1550   ALKPHOS 99 11/28/2018 1550  BILITOT 0.3 11/28/2018 1550      Component Value Date/Time   TSH 2.42  05/16/2018 1747   TSH 1.80 04/07/2016 1632   TSH 2.351 03/28/2014 1632   Results for LILYANN, GRAVELLE (MRN [id number]) as of 03/13/2019 15:53  Ref. Range 09/25/2018 15:02  Vitamin D, 25-Hydroxy Latest Ref Range: 30.0 - 100.0 ng/mL 48.9    I, Michaelene Song, am acting as Location manager for [employer], PA-C I, Abby Potash, PA-C have reviewed above note and agree with its content

## 2019-03-25 DIAGNOSIS — F339 Major depressive disorder, recurrent, unspecified: Secondary | ICD-10-CM | POA: Diagnosis not present

## 2019-03-25 DIAGNOSIS — F411 Generalized anxiety disorder: Secondary | ICD-10-CM | POA: Diagnosis not present

## 2019-03-27 ENCOUNTER — Ambulatory Visit (INDEPENDENT_AMBULATORY_CARE_PROVIDER_SITE_OTHER): Payer: BC Managed Care – PPO | Admitting: Physician Assistant

## 2019-03-27 ENCOUNTER — Encounter (INDEPENDENT_AMBULATORY_CARE_PROVIDER_SITE_OTHER): Payer: Self-pay | Admitting: Physician Assistant

## 2019-03-27 ENCOUNTER — Other Ambulatory Visit: Payer: Self-pay

## 2019-03-27 VITALS — BP 118/82 | HR 88 | Temp 98.4°F | Ht 64.0 in | Wt 266.0 lb

## 2019-03-27 DIAGNOSIS — Z6841 Body Mass Index (BMI) 40.0 and over, adult: Secondary | ICD-10-CM

## 2019-03-27 DIAGNOSIS — Z9189 Other specified personal risk factors, not elsewhere classified: Secondary | ICD-10-CM

## 2019-03-27 DIAGNOSIS — K5909 Other constipation: Secondary | ICD-10-CM

## 2019-03-27 DIAGNOSIS — E559 Vitamin D deficiency, unspecified: Secondary | ICD-10-CM

## 2019-03-27 MED ORDER — POLYETHYLENE GLYCOL 3350 17 GM/SCOOP PO POWD: 17.0000 g | Freq: Every day | ORAL | 0 refills | Status: DC

## 2019-03-27 NOTE — Progress Notes (Signed)
Office: [phone]  /  Fax: [phone]   HPI:   Chief Complaint: OBESITY Cleopatra is here to discuss her progress with her obesity treatment plan. She is on the portion control better and make smarter food choices, such as increase vegetables and decrease simple carbohydrates and is following her eating plan approximately 10 % of the time. She states she is exercising 0 minutes 0 times per week. Xee reports that she is not ready to follow a structured meal plan or journal. She is traveling to the beach in 2 weeks.  Her weight is 266 lb (120.7 kg) today and has gained 13 lbs since her last visit. She has lost 13 lbs since starting treatment with [place].  Constipation Taima notes constipation. She reports that Colace is not working. She continues to have BM's every 5-6 dats. She is not getting enough fiber. She denies hematochezia or melena. She denies drinking less H20 recently.  Vitamin D Deficiency Gerldine has a diagnosis of vitamin D deficiency. She is currently taking OTC Vit D and denies nausea, vomiting or muscle weakness.  At risk for osteopenia and osteoporosis Khadeejah is at higher risk of osteopenia and osteoporosis due to vitamin D deficiency.   ASSESSMENT AND PLAN:  Other constipation - Plan: polyethylene glycol powder (GLYCOLAX/MIRALAX) 17 GM/SCOOP powder  Vitamin D deficiency  At risk for osteoporosis  Class 3 severe obesity with serious comorbidity and body mass index (BMI) of 45.0 to 49.9 in adult, unspecified obesity type ([place])  PLAN:  Constipation Zylie was informed decrease bowel movement frequency is normal while losing weight, but stools should not be hard or painful. She was advised to increase her H20 intake and work on increasing her fiber intake. High fiber foods were discussed today. Marg agrees to start miralax OTC 17 g every other day as needed #1 bottle with no refills. Mehr agrees to follow up with our clinic in 3 weeks.  Vitamin D Deficiency Swati was informed  that low vitamin D levels contributes to fatigue and are associated with obesity, breast, and colon cancer. Jamieson agrees to continue taking OTC Vit D and will follow up for routine testing of vitamin D, at least 2-3 times per year. She was informed of the risk of over-replacement of vitamin D and agrees to not increase her dose unless she discusses this with [place] first. Lakeya agrees to follow up with our clinic in 3 weeks.  At risk for osteopenia and osteoporosis Yitty was given extended (15 minutes) osteoporosis prevention counseling today. Uma is at risk for osteopenia and osteoporsis due to her vitamin D deficiency. She was encouraged to take her vitamin D and follow her higher calcium diet and increase strengthening exercise to help strengthen her bones and decrease her risk of osteopenia and osteoporosis.  Obesity Jaedyn is currently in the action stage of change. As such, her goal is to continue with weight loss efforts She has agreed to portion control better and make smarter food choices, such as increase vegetables and decrease simple carbohydrates  Caren has been instructed to work up to a goal of 150 minutes of combined cardio and strengthening exercise per week for weight loss and overall health benefits. We discussed the following Behavioral Modification Strategies today: work on meal planning and easy cooking plans, travel eating strategies, and keeping healthy foods in the home    Crystalle has agreed to follow up with our clinic in 3 weeks. She was informed of the importance of frequent follow up visits  to maximize her success with intensive lifestyle modifications for her multiple health conditions.  ALLERGIES: Allergies  Allergen Reactions  . Biaxin [Clarithromycin] Hives  . Metformin And Related Diarrhea    MEDICATIONS: Current Outpatient Medications on File Prior to Visit  Medication Sig Dispense Refill  . acetaminophen-codeine (TYLENOL #3) 300-30 MG tablet Take 1 tablet by mouth  every 8 (eight) hours as needed for moderate pain. 15 tablet 0  . Aspirin-Acetaminophen-Caffeine (EXCEDRIN PO) Take 1 capsule by mouth daily. As needed    . atorvastatin (LIPITOR) 20 MG tablet TAKE 1 TABLET (20 MG TOTAL) BY MOUTH DAILY AT 6 PM. 90 tablet 2  . buPROPion (WELLBUTRIN SR) 150 MG 12 hr tablet TAKE 1 TABLET BY MOUTH TWICE A DAY 180 tablet 0  . Cholecalciferol (VITAMIN D3) 125 MCG (5000 UT) TABS Take 1 tablet by mouth daily.    . clonazePAM (KLONOPIN) 0.5 MG tablet TAKE 1 TABLET BY MOUTH TWICE A DAY AS NEEDED FOR ANXIETY 60 tablet 0  . cyclobenzaprine (FLEXERIL) 10 MG tablet Take 1 tablet (10 mg total) by mouth 2 (two) times daily as needed for muscle spasms (sedation precautions). 30 tablet 1  . dexlansoprazole (DEXILANT) 60 MG capsule TAKE 1 CAPSULE BY MOUTH EVERY DAY 90 capsule 1  . dextromethorphan-guaiFENesin (MUCINEX DM) 30-600 MG 12hr tablet Take 1 tablet by mouth as needed for cough.    . docusate sodium (COLACE) 100 MG capsule Take 1 capsule (100 mg total) by mouth 2 (two) times daily. 60 capsule 0  . fluticasone (FLONASE) 50 MCG/ACT nasal spray Place into both nostrils daily.    Marland Kitchen glucose blood (ONE TOUCH ULTRA TEST) test strip Use to check sugar twice daily and as needed. Dx: E11.65 300 each 3  . ibuprofen (ADVIL,MOTRIN) 600 MG tablet Take 600 mg by mouth daily.    . Insulin Pen Needle 31G X 6 MM MISC Use once daily for Victoza SQ 100 each 0  . Lancet Devices (ONE TOUCH DELICA LANCING DEV) MISC Use as directed with lancets and one touch meter E11.65 1 each 0  . liraglutide (VICTOZA) 18 MG/3ML SOPN Inject 0.1 mLs (0.6 mg total) into the skin daily. 2 pen 0  . magnesium oxide (MAG-OX) 400 MG tablet Take 400 mg by mouth daily.    . meclizine (ANTIVERT) 12.5 MG tablet Take 12.5 mg by mouth 3 (three) times daily as needed for dizziness.    . Multiple Vitamin (MULTIVITAMIN) capsule Take 1 capsule by mouth daily.    Glory Rosebush DELICA LANCETS 25E MISC Check blood sugar twice daily and  as directed. Dx E11.65 200 each 0  . pioglitazone (ACTOS) 30 MG tablet TAKE 1 TABLET BY MOUTH EVERY DAY 90 tablet 1  . POTASSIUM PO Take by mouth daily.    . ranitidine (ZANTAC) 150 MG tablet Take 150 mg by mouth at bedtime.    . TRADJENTA 5 MG TABS tablet TAKE 1 TABLET BY MOUTH EVERY DAY 30 tablet 5  . Venlafaxine HCl 225 MG TB24 TAKE 1 TABLET (225 MG TOTAL) BY MOUTH DAILY. 90 tablet 1   Current Facility-Administered Medications on File Prior to Visit  Medication Dose Route Frequency Provider Last Rate Last Dose  . diclofenac sodium (VOLTAREN) 1 % transdermal gel 2 g  2 g Topical QID Jessy Oto, MD        PAST MEDICAL HISTORY: Past Medical History:  Diagnosis Date  . Allergy   . Anemia   . Anxiety   . Barrett esophagus  Don Bulla, have not received EGD report  . Chronic back pain   . Constipation   . Depression with anxiety   . Diabetes mellitus without complication ([place])    DSME 04/2014  . Edema, lower extremity   . Fatty liver   . Gastric polyp 05/2012   h/o EGD  . GERD (gastroesophageal reflux disease)   . History of Helicobacter pylori infection   . HLD (hyperlipidemia)   . Hyperlipidemia   . Vitamin D deficiency     PAST SURGICAL HISTORY: Past Surgical History:  Procedure Laterality Date  . CESAREAN SECTION  6/95, 4/98  . DILATION AND CURETTAGE OF UTERUS  2011  . ESOPHAGOGASTRODUODENOSCOPY  2012?   [facility] Lango at [facility]  . ESOPHAGOGASTRODUODENOSCOPY  10/2017   [facility])  . LUMBAR DISC SURGERY  03/1995  . NASAL SINUS SURGERY  03/2001    SOCIAL HISTORY: Social History   Tobacco Use  . Smoking status: Never Smoker  . Smokeless tobacco: Never Used  Substance Use Topics  . Alcohol use: Yes    Alcohol/week: 0.0 standard drinks    Comment: occasional  . Drug use: No    FAMILY HISTORY: Family History  Problem Relation Age of Onset  . Breast cancer Maternal Grandmother 24  . Dementia Maternal Grandmother   . Heart disease Paternal  Grandfather   . Other Sister        Postural Orthostatic Tachycardia Syndrome  . Supraventricular tachycardia Sister   . Other Maternal Aunt        hysterectomy for irregular bleed with abnormal pap  . Hyperlipidemia Mother   . Alzheimer's disease Maternal Grandfather     ROS: Review of Systems  Constitutional: Negative for weight loss.  Gastrointestinal: Positive for constipation. Negative for melena, nausea and vomiting.       Negative hematochezia  Musculoskeletal:       Negative muscle weakness    PHYSICAL EXAM: Blood pressure 118/82, pulse 88, temperature 98.4 F (36.9 C), height 5\' 4"  (1.626 m), weight 266 lb (120.7 kg), SpO2 98 %. Body mass index is 45.66 kg/m. Physical Exam Vitals signs reviewed.  Constitutional:      Appearance: Normal appearance. She is obese.  Cardiovascular:     Rate and Rhythm: Normal rate.     Pulses: Normal pulses.  Pulmonary:     Effort: Pulmonary effort is normal.     Breath sounds: Normal breath sounds.  Musculoskeletal: Normal range of motion.  Skin:    General: Skin is warm and dry.  Neurological:     Mental Status: She is alert and oriented to person, place, and time.  Psychiatric:        Mood and Affect: Mood normal.        Behavior: Behavior normal.     RECENT LABS AND TESTS: BMET    Component Value Date/Time   NA 140 11/28/2018 1550   K 4.1 11/28/2018 1550   CL 101 11/28/2018 1550   CO2 22 11/28/2018 1550   GLUCOSE 87 11/28/2018 1550   GLUCOSE 89 05/16/2018 1747   BUN 9 11/28/2018 1550   CREATININE 0.76 11/28/2018 1550   CREATININE 0.68 03/28/2014 1632   CALCIUM 9.5 11/28/2018 1550   GFRNONAA 93 11/28/2018 1550   GFRAA 107 11/28/2018 1550   Lab Results  Component Value Date   HGBA1C 5.9 (H) 11/28/2018   HGBA1C 5.6 08/24/2018   HGBA1C 6.4 05/16/2018   HGBA1C 6.3 12/11/2017   HGBA1C 6.3 08/09/2017   Lab  Results  Component Value Date   INSULIN 9.8 11/28/2018   INSULIN 20.0 07/26/2018   CBC    Component  Value Date/Time   WBC 8.9 09/01/2016 1142   RBC 4.39 09/01/2016 1142   HGB 13.2 09/01/2016 1142   HCT 39.3 09/01/2016 1142   PLT 313.0 09/01/2016 1142   MCV 89.6 09/01/2016 1142   MCH 28.0 06/21/2014 0309   MCHC 33.7 09/01/2016 1142   RDW 15.2 09/01/2016 1142   LYMPHSABS 2.4 09/01/2016 1142   MONOABS 0.2 09/01/2016 1142   EOSABS 0.2 09/01/2016 1142   BASOSABS 0.0 09/01/2016 1142   Iron/TIBC/Ferritin/ %Sat    Component Value Date/Time   IRON 62 05/30/2016 1307   FERRITIN 55.2 05/30/2016 1307   IRONPCTSAT 16.4 (L) 05/30/2016 1307   Lipid Panel     Component Value Date/Time   CHOL 133 11/28/2018 1550   CHOL 123 10/30/2012   TRIG 136 11/28/2018 1550   TRIG 124 10/30/2012   HDL 46 11/28/2018 1550   CHOLHDL 3 05/16/2018 1747   VLDL 24.8 05/16/2018 1747   LDLCALC 60 11/28/2018 1550   LDLCALC 59 10/30/2012   LDLDIRECT 73.0 04/18/2017 0847   Hepatic Function Panel     Component Value Date/Time   PROT 6.7 11/28/2018 1550   ALBUMIN 4.3 11/28/2018 1550   AST 19 11/28/2018 1550   ALT 17 11/28/2018 1550   ALKPHOS 99 11/28/2018 1550   BILITOT 0.3 11/28/2018 1550      Component Value Date/Time   TSH 2.42 05/16/2018 1747   TSH 1.80 04/07/2016 1632   TSH 2.351 03/28/2014 1632      OBESITY BEHAVIORAL INTERVENTION VISIT  Today's visit was # 11   Starting weight: 279 lbs Starting date: 07/26/18 Today's weight : 266 lbs Today's date: 03/27/2019 Total lbs lost to date: 59    ASK: We discussed the diagnosis of obesity with [name] today and [name] agreed to give [place] permission to discuss obesity behavioral modification therapy today.  ASSESS: Tramaine has the diagnosis of obesity and her BMI today is 45.64 Azeneth is in the action stage of change   ADVISE: Aylah was educated on the multiple health risks of obesity as well as the benefit of weight loss to improve her health. She was advised of the need for long term treatment and the importance of lifestyle modifications to  improve her current health and to decrease her risk of future health problems.  AGREE: Multiple dietary modification options and treatment options were discussed and  Helayna agreed to follow the recommendations documented in the above note.  ARRANGE: Zoee was educated on the importance of frequent visits to treat obesity as outlined per CMS and USPSTF guidelines and agreed to schedule her next follow up appointment today.  Wilhemena Durie, am acting as transcriptionist for Abby Potash, PA-C I, Abby Potash, PA-C have reviewed above note and agree with its content

## 2019-03-29 ENCOUNTER — Other Ambulatory Visit: Payer: Self-pay | Admitting: Family Medicine

## 2019-03-29 DIAGNOSIS — F339 Major depressive disorder, recurrent, unspecified: Secondary | ICD-10-CM | POA: Diagnosis not present

## 2019-03-29 DIAGNOSIS — F411 Generalized anxiety disorder: Secondary | ICD-10-CM | POA: Diagnosis not present

## 2019-04-02 DIAGNOSIS — F339 Major depressive disorder, recurrent, unspecified: Secondary | ICD-10-CM | POA: Diagnosis not present

## 2019-04-02 DIAGNOSIS — F411 Generalized anxiety disorder: Secondary | ICD-10-CM | POA: Diagnosis not present

## 2019-04-08 ENCOUNTER — Other Ambulatory Visit (INDEPENDENT_AMBULATORY_CARE_PROVIDER_SITE_OTHER): Payer: Self-pay | Admitting: Physician Assistant

## 2019-04-08 ENCOUNTER — Other Ambulatory Visit: Payer: Self-pay | Admitting: Family Medicine

## 2019-04-08 DIAGNOSIS — K5909 Other constipation: Secondary | ICD-10-CM

## 2019-04-08 NOTE — Telephone Encounter (Signed)
Eprescribed.

## 2019-04-08 NOTE — Telephone Encounter (Signed)
Last OV 01/10/19 Acute, last CPE 05/16/18 Last refill 02/06/19 #60  No future appts scheduled.

## 2019-04-13 ENCOUNTER — Other Ambulatory Visit (INDEPENDENT_AMBULATORY_CARE_PROVIDER_SITE_OTHER): Payer: Self-pay | Admitting: Bariatrics

## 2019-04-13 ENCOUNTER — Other Ambulatory Visit (INDEPENDENT_AMBULATORY_CARE_PROVIDER_SITE_OTHER): Payer: Self-pay | Admitting: Physician Assistant

## 2019-04-13 DIAGNOSIS — K5909 Other constipation: Secondary | ICD-10-CM

## 2019-04-13 DIAGNOSIS — E119 Type 2 diabetes mellitus without complications: Secondary | ICD-10-CM

## 2019-04-15 ENCOUNTER — Other Ambulatory Visit (INDEPENDENT_AMBULATORY_CARE_PROVIDER_SITE_OTHER): Payer: Self-pay | Admitting: Physician Assistant

## 2019-04-15 DIAGNOSIS — K5909 Other constipation: Secondary | ICD-10-CM

## 2019-04-17 DIAGNOSIS — F411 Generalized anxiety disorder: Secondary | ICD-10-CM | POA: Diagnosis not present

## 2019-04-17 DIAGNOSIS — F339 Major depressive disorder, recurrent, unspecified: Secondary | ICD-10-CM | POA: Diagnosis not present

## 2019-04-22 ENCOUNTER — Ambulatory Visit (INDEPENDENT_AMBULATORY_CARE_PROVIDER_SITE_OTHER): Payer: BC Managed Care – PPO | Admitting: Bariatrics

## 2019-04-22 ENCOUNTER — Encounter (INDEPENDENT_AMBULATORY_CARE_PROVIDER_SITE_OTHER): Payer: Self-pay | Admitting: Bariatrics

## 2019-04-22 ENCOUNTER — Other Ambulatory Visit: Payer: Self-pay

## 2019-04-22 VITALS — BP 119/73 | HR 88 | Temp 97.6°F | Ht 64.0 in | Wt 275.0 lb

## 2019-04-22 DIAGNOSIS — F3289 Other specified depressive episodes: Secondary | ICD-10-CM | POA: Diagnosis not present

## 2019-04-22 DIAGNOSIS — R7309 Other abnormal glucose: Secondary | ICD-10-CM | POA: Diagnosis not present

## 2019-04-22 DIAGNOSIS — Z9189 Other specified personal risk factors, not elsewhere classified: Secondary | ICD-10-CM

## 2019-04-22 DIAGNOSIS — E119 Type 2 diabetes mellitus without complications: Secondary | ICD-10-CM

## 2019-04-22 DIAGNOSIS — Z6841 Body Mass Index (BMI) 40.0 and over, adult: Secondary | ICD-10-CM

## 2019-04-22 DIAGNOSIS — K5909 Other constipation: Secondary | ICD-10-CM

## 2019-04-22 MED ORDER — LINACLOTIDE 145 MCG PO CAPS: 145.0000 ug | ORAL_CAPSULE | Freq: Every day | ORAL | 0 refills | Status: DC

## 2019-04-22 MED ORDER — VICTOZA 18 MG/3ML ~~LOC~~ SOPN: 0.6000 mg | PEN_INJECTOR | Freq: Every day | SUBCUTANEOUS | 0 refills | Status: DC

## 2019-04-22 MED ORDER — DOCUSATE SODIUM 100 MG PO CAPS: 100.0000 mg | ORAL_CAPSULE | Freq: Two times a day (BID) | ORAL | 0 refills | Status: DC

## 2019-04-22 NOTE — Progress Notes (Signed)
Office: [phone]  /  Fax: [phone]   HPI:   Chief Complaint: OBESITY Lisa Curry Curry is here to discuss her progress with her obesity treatment plan. She is on the portion control better and make smarter food choices, such as increase vegetables and decrease simple carbohydrates and is following her eating plan approximately 5 % of the time. She states she is exercising 0 minutes 0 times per week. Lisa Curry Curry is up 9 lbs. She is following portion Clinical cytogeneticist. She is trying not to do emotional eating.  Her weight is 275 lb (124.7 kg) today and has gained 9 lbs since her last visit. She has lost 4 lbs since starting treatment with [place].  Diabetes II Lisa Curry Curry has a diagnosis of diabetes type II. Lisa Curry Curry is taking Victoza and denies low BGs. She states her fasting BGs range between 110 and 150. She denies hypoglycemia. Last A1c 5.9. She has been working on intensive lifestyle modifications including diet, exercise, and weight loss to help control her blood glucose levels.  At risk for cardiovascular disease Lisa Curry Curry is at a higher than average risk for cardiovascular disease due to obesity and diabetes II. She currently denies any chest pain.  Constipation Lisa Curry Curry notes constipation for the last few weeks, worse since attempting weight loss. She states BM are less frequent and are hard and painful. She denies hematochezia or melena. She denies drinking less H20 recently.  Depression with Emotional Eating Behaviors Lisa Curry Curry is taking Wellbutrin. Lisa Curry Curry struggles with emotional eating and using food for comfort to the extent that it is negatively impacting her health. She often snacks when she is not hungry. Lisa Curry Curry sometimes feels she is out of control and then feels guilty that she made poor food choices. She has been working on behavior modification techniques to help reduce her emotional eating and has been somewhat successful. She shows no sign of suicidal or homicidal ideations.  Depression screen Lisa Curry Curry  10/23/2018 10/08/2018 09/20/2018 07/26/2018 05/16/2018  Decreased Interest 2 2 1 1 3   Down, Depressed, Hopeless 1 1 1 1 1   PHQ - 2 Score 3 3 2 2 4   Altered sleeping 1 0 0 0 1  Tired, decreased energy 3 3 1 2 2   Change in appetite 2 2 0 2 2  Feeling bad or failure about yourself  0 1 0 2 1  Trouble concentrating 2 1 0 1 2  Moving slowly or fidgety/restless 1 0 0 0 1  Suicidal thoughts 0 0 0 0 0  PHQ-9 Score 12 10 3 9 13   Difficult doing work/chores - - - Somewhat difficult -    ASSESSMENT AND PLAN:  Type 2 diabetes mellitus without complication, without long-term current use of insulin (HCC) - Plan: liraglutide (VICTOZA) 18 MG/3ML SOPN, Comprehensive metabolic panel, Microalbumin / creatinine urine ratio, Hemoglobin A1c, VITAMIN D 25 Hydroxy (Vit-D Deficiency, Fractures)  Other constipation - Plan: docusate sodium (COLACE) 100 MG capsule, linaclotide (LINZESS) 145 MCG CAPS capsule  Other depression - WITH EMOTIONAL EATING   At risk for heart disease  Class 3 severe obesity with serious comorbidity and body mass index (BMI) of 45.0 to 49.9 in adult, unspecified obesity type (HCC)  PLAN:  Diabetes II Lisa Curry Curry has been given extensive diabetes education by myself today including ideal fasting and post-prandial blood glucose readings, individual ideal Hgb A1c goals and hypoglycemia prevention. We discussed the importance of good blood sugar control to decrease the likelihood of diabetic complications such as nephropathy, neuropathy, limb loss, blindness, coronary artery  disease, and death. We discussed the importance of intensive lifestyle modification including diet, exercise and weight loss as the first line treatment for diabetes. Lisa Curry Curry agrees to continue her diabetes medications, and she agrees to continue Victoza 0.6 mg SubQ daily #2 pens and we will refill for 1 month. Lisa Curry Curry agrees to follow up with our clinic in 2 weeks.  Cardiovascular risk counseling Lisa Curry Curry was given extended (15 minutes)  coronary artery disease prevention counseling today. She is 48 y.o. female and has risk factors for heart disease including obesity and diabetes II. We discussed intensive lifestyle modifications today with an emphasis on specific weight loss instructions and strategies. Pt was also informed of the importance of increasing exercise and decreasing saturated fats to help prevent heart disease.  Constipation Lisa Curry Curry was informed decrease bowel movement frequency is normal while losing weight, but stools should not be hard or painful. She was advised to increase her H20 intake and work on increasing her fiber intake. High fiber foods were discussed today. Lisa Curry Curry agrees to start colace 100 mg 1 capsule BID #60 with no refills, will continue taking miralax OTC, use enema as directed. She may need a medication for constipation. Lisa Curry Curry agrees to start Linzess 145 mcg daily before breakfast #30 with no refills. Lisa Curry Curry agrees to follow up with our clinic in 2 weeks.   Depression with Emotional Eating Behaviors We discussed behavior modification techniques today to help Lisa Curry Curry deal with her emotional eating and depression. Lisa Curry Curry agrees to continue taking Wellbutrin, and she agrees to follow up with our clinic in 2 weeks.  Obesity Lisa Curry Curry is currently in the action stage of change. As such, her goal is to continue with weight loss efforts She has agreed to follow the Category 3 plan with modified skim milk Lisa Curry Curry has been instructed to work up to a goal of 150 minutes of combined cardio and strengthening exercise per week for weight loss and overall health benefits. We discussed the following Behavioral Modification Strategies today: increasing lean protein intake, decreasing simple carbohydrates, increasing vegetables, increase H20 intake, decrease eating out, no skipping meals, work on meal planning and easy cooking plans, and keeping healthy foods in the home   Lisa Curry Curry has agreed to follow up with our clinic in 2 weeks. She was  informed of the importance of frequent follow up visits to maximize her success with intensive lifestyle modifications for her multiple health conditions.  ALLERGIES: Allergies  Allergen Reactions  . Biaxin [Clarithromycin] Hives  . Metformin And Related Diarrhea    MEDICATIONS: Current Outpatient Medications on File Prior to Visit  Medication Sig Dispense Refill  . acetaminophen-codeine (TYLENOL #3) 300-30 MG tablet Take 1 tablet by mouth every 8 (eight) hours as needed for moderate pain. 15 tablet 0  . Aspirin-Acetaminophen-Caffeine (EXCEDRIN PO) Take 1 capsule by mouth daily. As needed    . atorvastatin (LIPITOR) 20 MG tablet TAKE 1 TABLET (20 MG TOTAL) BY MOUTH DAILY AT 6 PM. 90 tablet 2  . BD PEN NEEDLE MICRO U/F 32G X 6 MM MISC USE ONCE DAILY FOR VICTOZA 100 each 0  . buPROPion (WELLBUTRIN SR) 150 MG 12 hr tablet TAKE 1 TABLET BY MOUTH TWICE A DAY 180 tablet 0  . Cholecalciferol (VITAMIN D3) 125 MCG (5000 UT) TABS Take 1 tablet by mouth daily.    . clonazePAM (KLONOPIN) 0.5 MG tablet TAKE 1 TABLET BY MOUTH TWICE A DAY AS NEEDED FOR ANXIETY 60 tablet 0  . cyclobenzaprine (FLEXERIL) 10 MG tablet Take 1 tablet (10  mg total) by mouth 2 (two) times daily as needed for muscle spasms (sedation precautions). 30 tablet 1  . dexlansoprazole (DEXILANT) 60 MG capsule TAKE 1 CAPSULE BY MOUTH EVERY DAY 90 capsule 1  . dextromethorphan-guaiFENesin (MUCINEX DM) 30-600 MG 12hr tablet Take 1 tablet by mouth as needed for cough.    . fluticasone (FLONASE) 50 MCG/ACT nasal spray Place into both nostrils daily.    Marland Kitchen glucose blood (ONE TOUCH ULTRA TEST) test strip Use to check sugar twice daily and as needed. Dx: E11.65 300 each 3  . ibuprofen (ADVIL,MOTRIN) 600 MG tablet Take 600 mg by mouth daily.    Elmore Guise Devices (ONE TOUCH DELICA LANCING DEV) MISC Use as directed with lancets and one touch meter E11.65 1 each 0  . magnesium oxide (MAG-OX) 400 MG tablet Take 400 mg by mouth daily.    . meclizine  (ANTIVERT) 12.5 MG tablet Take 12.5 mg by mouth 3 (three) times daily as needed for dizziness.    . Multiple Vitamin (MULTIVITAMIN) capsule Take 1 capsule by mouth daily.    Glory Rosebush DELICA LANCETS 32R MISC Check blood sugar twice daily and as directed. Dx E11.65 200 each 0  . pioglitazone (ACTOS) 30 MG tablet TAKE 1 TABLET BY MOUTH EVERY DAY 90 tablet 1  . polyethylene glycol powder (GLYCOLAX/MIRALAX) 17 GM/SCOOP powder Take 17 g by mouth daily. 3350 g 0  . POTASSIUM PO Take by mouth daily.    . ranitidine (ZANTAC) 150 MG tablet Take 150 mg by mouth at bedtime.    . TRADJENTA 5 MG TABS tablet TAKE 1 TABLET BY MOUTH EVERY DAY 30 tablet 5  . Venlafaxine HCl 225 MG TB24 TAKE 1 TABLET (225 MG TOTAL) BY MOUTH DAILY. 90 tablet 0   Current Facility-Administered Medications on File Prior to Visit  Medication Dose Route Frequency Provider Last Rate Last Dose  . diclofenac sodium (VOLTAREN) 1 % transdermal gel 2 g  2 g Topical QID Jessy Oto, MD        PAST MEDICAL HISTORY: Past Medical History:  Diagnosis Date  . Allergy   . Anemia   . Anxiety   . Barrett esophagus    Lisa Curry Curry, have not received EGD report  . Chronic back pain   . Constipation   . Depression with anxiety   . Diabetes mellitus without complication ([place])    DSME 04/2014  . Edema, lower extremity   . Fatty liver   . Gastric polyp 05/2012   h/o EGD  . GERD (gastroesophageal reflux disease)   . History of Helicobacter pylori infection   . HLD (hyperlipidemia)   . Hyperlipidemia   . Vitamin D deficiency     PAST SURGICAL HISTORY: Past Surgical History:  Procedure Laterality Date  . CESAREAN SECTION  6/95, 4/98  . DILATION AND CURETTAGE OF UTERUS  2011  . ESOPHAGOGASTRODUODENOSCOPY  2012?   [facility] at [facility]  . ESOPHAGOGASTRODUODENOSCOPY  10/2017   [facility] Ardis Hughs)  . LUMBAR DISC SURGERY  03/1995  . NASAL SINUS SURGERY  03/2001    SOCIAL HISTORY: Social History   Tobacco Use  . Smoking status:  Never Smoker  . Smokeless tobacco: Never Used  Substance Use Topics  . Alcohol use: Yes    Alcohol/week: 0.0 standard drinks    Comment: occasional  . Drug use: No    FAMILY HISTORY: Family History  Problem Relation Age of Onset  . Breast cancer Maternal Grandmother 81  . Dementia Maternal  Grandmother   . Heart disease Paternal Grandfather   . Other Sister        Postural Orthostatic Tachycardia Syndrome  . Supraventricular tachycardia Sister   . Other Maternal Aunt        hysterectomy for irregular bleed with abnormal pap  . Hyperlipidemia Mother   . Alzheimer's disease Maternal Grandfather     ROS: Review of Systems  Constitutional: Negative for weight loss.  Cardiovascular: Negative for chest pain.  Gastrointestinal: Positive for constipation. Negative for melena.       Negative hematochezia  Endo/Heme/Allergies:       Negative hypoglycemia  Psychiatric/Behavioral: Positive for depression. Negative for suicidal ideas.    PHYSICAL EXAM: Blood pressure 119/73, pulse 88, temperature 97.6 F (36.4 C), temperature source Oral, height 5\' 4"  (1.626 m), weight 275 lb (124.7 kg), last menstrual period 04/08/2019, SpO2 99 %. Body mass index is 47.2 kg/m. Physical Exam Vitals signs reviewed.  Constitutional:      Appearance: Normal appearance. She is obese.  Cardiovascular:     Rate and Rhythm: Normal rate.     Pulses: Normal pulses.  Pulmonary:     Effort: Pulmonary effort is normal.     Breath sounds: Normal breath sounds.  Musculoskeletal: Normal range of motion.  Skin:    General: Skin is warm and dry.  Neurological:     Mental Status: She is alert and oriented to person, place, and time.  Psychiatric:        Mood and Affect: Mood normal.        Behavior: Behavior normal.     RECENT LABS AND TESTS: BMET    Component Value Date/Time   NA 140 11/28/2018 1550   K 4.1 11/28/2018 1550   CL 101 11/28/2018 1550   CO2 22 11/28/2018 1550   GLUCOSE 87 11/28/2018  1550   GLUCOSE 89 05/16/2018 1747   BUN 9 11/28/2018 1550   CREATININE 0.76 11/28/2018 1550   CREATININE 0.68 03/28/2014 1632   CALCIUM 9.5 11/28/2018 1550   GFRNONAA 93 11/28/2018 1550   GFRAA 107 11/28/2018 1550   Lab Results  Component Value Date   HGBA1C 5.9 (H) 11/28/2018   HGBA1C 5.6 08/24/2018   HGBA1C 6.4 05/16/2018   HGBA1C 6.3 12/11/2017   HGBA1C 6.3 08/09/2017   Lab Results  Component Value Date   INSULIN 9.8 11/28/2018   INSULIN 20.0 07/26/2018   CBC    Component Value Date/Time   WBC 8.9 09/01/2016 1142   RBC 4.39 09/01/2016 1142   HGB 13.2 09/01/2016 1142   HCT 39.3 09/01/2016 1142   PLT 313.0 09/01/2016 1142   MCV 89.6 09/01/2016 1142   MCH 28.0 06/21/2014 0309   MCHC 33.7 09/01/2016 1142   RDW 15.2 09/01/2016 1142   LYMPHSABS 2.4 09/01/2016 1142   MONOABS 0.2 09/01/2016 1142   EOSABS 0.2 09/01/2016 1142   BASOSABS 0.0 09/01/2016 1142   Iron/TIBC/Ferritin/ %Sat    Component Value Date/Time   IRON 62 05/30/2016 1307   FERRITIN 55.2 05/30/2016 1307   IRONPCTSAT 16.4 (L) 05/30/2016 1307   Lipid Panel     Component Value Date/Time   CHOL 133 11/28/2018 1550   CHOL 123 10/30/2012   TRIG 136 11/28/2018 1550   TRIG 124 10/30/2012   HDL 46 11/28/2018 1550   CHOLHDL 3 05/16/2018 1747   VLDL 24.8 05/16/2018 1747   LDLCALC 60 11/28/2018 1550   LDLCALC 59 10/30/2012   LDLDIRECT 73.0 04/18/2017 0847   Hepatic Function Panel  Component Value Date/Time   PROT 6.7 11/28/2018 1550   ALBUMIN 4.3 11/28/2018 1550   AST 19 11/28/2018 1550   ALT 17 11/28/2018 1550   ALKPHOS 99 11/28/2018 1550   BILITOT 0.3 11/28/2018 1550      Component Value Date/Time   TSH 2.42 05/16/2018 1747   TSH 1.80 04/07/2016 1632   TSH 2.351 03/28/2014 1632      OBESITY BEHAVIORAL INTERVENTION VISIT  Today's visit was # 12   Starting weight: 279 lbs Starting date: 07/26/18 Today's weight : 275 lbs Today's date: 04/22/2019 Total lbs lost to date: 4    ASK:  We discussed the diagnosis of obesity with Lisa Curry Curry today and Lisa Curry Curry agreed to give [place] permission to discuss obesity behavioral modification therapy today.  ASSESS: Nairi has the diagnosis of obesity and her BMI today is 47.18 Mallorey is in the action stage of change   ADVISE: Avital was educated on the multiple health risks of obesity as well as the benefit of weight loss to improve her health. She was advised of the need for long term treatment and the importance of lifestyle modifications to improve her current health and to decrease her risk of future health problems.  AGREE: Multiple dietary modification options and treatment options were discussed and  Jaquasia agreed to follow the recommendations documented in the above note.  ARRANGE: Khylah was educated on the importance of frequent visits to treat obesity as outlined per CMS and USPSTF guidelines and agreed to schedule her next follow up appointment today.  Lisa Curry Curry, am acting as transcriptionist for [employer], DO  I have reviewed the above documentation for accuracy and completeness, and I agree with the above. -Lisa Curry Lesch, DO

## 2019-04-23 ENCOUNTER — Encounter (INDEPENDENT_AMBULATORY_CARE_PROVIDER_SITE_OTHER): Payer: Self-pay | Admitting: Bariatrics

## 2019-04-23 LAB — COMPREHENSIVE METABOLIC PANEL
ALT: 23 IU/L (ref 0–32)
AST: 26 IU/L (ref 0–40)
Albumin/Globulin Ratio: 2 (ref 1.2–2.2)
Albumin: 4.3 g/dL (ref 3.8–4.8)
Alkaline Phosphatase: 97 IU/L (ref 39–117)
BUN/Creatinine Ratio: 15 (ref 9–23)
BUN: 11 mg/dL (ref 6–24)
Bilirubin Total: 0.4 mg/dL (ref 0.0–1.2)
CO2: 25 mmol/L (ref 20–29)
Calcium: 9 mg/dL (ref 8.7–10.2)
Chloride: 99 mmol/L (ref 96–106)
Creatinine, Ser: 0.75 mg/dL (ref 0.57–1.00)
GFR calc Af Amer: 108 mL/min/{1.73_m2} (ref >59)
GFR calc non Af Amer: 94 mL/min/{1.73_m2} (ref >59)
Globulin, Total: 2.1 g/dL (ref 1.5–4.5)
Glucose: 89 mg/dL (ref 65–99)
Potassium: 4.4 mmol/L (ref 3.5–5.2)
Sodium: 140 mmol/L (ref 134–144)
Total Protein: 6.4 g/dL (ref 6.0–8.5)

## 2019-04-23 LAB — VITAMIN D 25 HYDROXY (VIT D DEFICIENCY, FRACTURES): Vit D, 25-Hydroxy: 43.4 ng/mL (ref 30.0–100.0)

## 2019-04-23 LAB — MICROALBUMIN / CREATININE URINE RATIO
Creatinine, Urine: 11.8 mg/dL
Microalb/Creat Ratio: <25 mg/g creat (ref 0–29)
Microalbumin, Urine: <3 ug/mL

## 2019-04-23 LAB — HEMOGLOBIN A1C
Est. average glucose Bld gHb Est-mCnc: 123 mg/dL
Hgb A1c MFr Bld: 5.9 % — ABNORMAL HIGH (ref 4.8–5.6)

## 2019-04-23 NOTE — Telephone Encounter (Signed)
Please review

## 2019-04-24 ENCOUNTER — Encounter (INDEPENDENT_AMBULATORY_CARE_PROVIDER_SITE_OTHER): Payer: Self-pay | Admitting: Bariatrics

## 2019-05-01 DIAGNOSIS — F411 Generalized anxiety disorder: Secondary | ICD-10-CM | POA: Diagnosis not present

## 2019-05-01 DIAGNOSIS — F339 Major depressive disorder, recurrent, unspecified: Secondary | ICD-10-CM | POA: Diagnosis not present

## 2019-05-02 ENCOUNTER — Other Ambulatory Visit: Payer: Self-pay | Admitting: Family Medicine

## 2019-05-02 NOTE — Telephone Encounter (Signed)
Spoke with pt asking how she is doing with the increased dose of Wellbutrin.  Pt states she forgot to let Dr. Darnell Level know, but it is helping.  Notified pt med will be refilled.  Fyi to Dr. Darnell Level.

## 2019-05-08 ENCOUNTER — Encounter (INDEPENDENT_AMBULATORY_CARE_PROVIDER_SITE_OTHER): Payer: Self-pay | Admitting: Bariatrics

## 2019-05-08 ENCOUNTER — Ambulatory Visit (INDEPENDENT_AMBULATORY_CARE_PROVIDER_SITE_OTHER): Payer: BC Managed Care – PPO | Admitting: Bariatrics

## 2019-05-08 ENCOUNTER — Other Ambulatory Visit: Payer: Self-pay

## 2019-05-08 VITALS — BP 129/65 | HR 84 | Temp 98.3°F | Ht 64.0 in | Wt 267.0 lb

## 2019-05-08 DIAGNOSIS — Z6841 Body Mass Index (BMI) 40.0 and over, adult: Secondary | ICD-10-CM

## 2019-05-08 DIAGNOSIS — K760 Fatty (change of) liver, not elsewhere classified: Secondary | ICD-10-CM

## 2019-05-08 DIAGNOSIS — E559 Vitamin D deficiency, unspecified: Secondary | ICD-10-CM

## 2019-05-08 DIAGNOSIS — Z9189 Other specified personal risk factors, not elsewhere classified: Secondary | ICD-10-CM

## 2019-05-08 DIAGNOSIS — E119 Type 2 diabetes mellitus without complications: Secondary | ICD-10-CM | POA: Diagnosis not present

## 2019-05-09 NOTE — Progress Notes (Signed)
Office: [phone]  /  Fax: [phone]   HPI:   Chief Complaint: OBESITY Lisa Curry is here to discuss her progress with her obesity treatment plan. She is on the  follow the Category 3 plan and is following her eating plan approximately 50 % of the time. She states she is exercising 0 minutes 0 times per week. Lisa Curry is down 8 lbs. She states that she "tightened up" most days.   Her weight is 267 lb (121.1 kg) today and has had a weight loss of 8 pounds over a period of 2 weeks since her last visit. She has lost 12 lbs since starting treatment with Lisa Curry.  Diabetes II [name] has a diagnosis of diabetes type II. [name] states fatsing BGs range between 110 and 130 and 2 hours post prandial BGs in the 140s denies any hypoglycemic episodes. Last A1c was 5.9 (prediabetes range).  She has been working on intensive lifestyle modifications including diet, exercise, and weight loss to help control her blood glucose levels.  Fatty Liver  [name] has a diagnosis of fatty liver.   Vitamin D deficiency [name] has a diagnosis of vitamin D deficiency. She is currently taking vit D and denies nausea, vomiting or muscle weakness.  At risk for osteopenia and osteoporosis [name] is at higher risk of osteopenia and osteoporosis due to vitamin D deficiency.   ASSESSMENT AND PLAN:  Type 2 diabetes mellitus without complication, without long-term current use of insulin (HCC)  Fatty liver  Vitamin D deficiency - Plan: Vitamin D, Ergocalciferol, (DRISDOL) 1.25 MG (50000 UT) CAPS capsule  At risk for osteoporosis  Class 3 severe obesity with serious comorbidity and body mass index (BMI) of 45.0 to 49.9 in adult, unspecified obesity type ([place])  PLAN: Diabetes II [name] has been given extensive diabetes education by myself today including ideal fasting and post-prandial blood glucose readings, individual ideal HgA1c goals  and hypoglycemia prevention. We discussed the importance of good blood sugar control to decrease the  likelihood of diabetic complications such as nephropathy, neuropathy, limb loss, blindness, coronary artery disease, and death. We discussed the importance of intensive lifestyle modification including diet, exercise and weight loss as the first line treatment for diabetes. Lusero agrees to continue her diabetes medications and will follow up at the agreed upon time.  Fatty Liver [name] agrees to continue with weight loss at least 5-10% and increase activity.   Vitamin D Deficiency [name] was informed that low vitamin D levels contributes to fatigue and are associated with obesity, breast, and colon cancer. She agrees to continue to take prescription Vit D @50 ,000 IU every 2 week #2 with no refills and will follow up for routine testing of vitamin D, at least 2-3 times per year. She was informed of the risk of over-replacement of vitamin D and agrees to not increase her dose unless she discusses this with Lisa Curry first.  At risk for osteopenia and osteoporosis [name] was given extended  (15 minutes) osteoporosis prevention counseling today. [name] is at risk for osteopenia and osteoporsis due to her vitamin D deficiency. She was encouraged to take her vitamin D and follow her higher calcium diet and increase strengthening exercise to help strengthen her bones and decrease her risk of osteopenia and osteoporosis.   Obesity [name] is currently in the action stage of change. As such, her goal is to continue with weight loss efforts She has agreed to follow the Category 3 plan [name] has been instructed to work up to a goal of  150 minutes of combined cardio and strengthening exercise per week for weight loss and overall health benefits. We discussed the following Behavioral Modification Stratagies today:increase water intake, no skipping meals, keeping healthy foods in home,  increasing lean protein intake, decreasing simple carbohydrates , increasing vegetables and decrease eating out  [name] has agreed to follow up  with our clinic in 2 weeks. She was informed of the importance of frequent follow up visits to maximize her success with intensive lifestyle modifications for her multiple health conditions.  ALLERGIES: Allergies  Allergen Reactions  . Biaxin [Clarithromycin] Hives  . Metformin And Related Diarrhea    MEDICATIONS: Current Outpatient Medications on File Prior to Visit  Medication Sig Dispense Refill  . acetaminophen-codeine (TYLENOL #3) 300-30 MG tablet Take 1 tablet by mouth every 8 (eight) hours as needed for moderate pain. 15 tablet 0  . Aspirin-Acetaminophen-Caffeine (EXCEDRIN PO) Take 1 capsule by mouth daily. As needed    . atorvastatin (LIPITOR) 20 MG tablet TAKE 1 TABLET (20 MG TOTAL) BY MOUTH DAILY AT 6 PM. 90 tablet 2  . BD PEN NEEDLE MICRO U/F 32G X 6 MM MISC USE ONCE DAILY FOR VICTOZA 100 each 0  . buPROPion (WELLBUTRIN SR) 150 MG 12 hr tablet TAKE 1 TABLET BY MOUTH TWICE A DAY 180 tablet 0  . Cholecalciferol (VITAMIN D3) 125 MCG (5000 UT) TABS Take 1 tablet by mouth daily.    . clonazePAM (KLONOPIN) 0.5 MG tablet TAKE 1 TABLET BY MOUTH TWICE A DAY AS NEEDED FOR ANXIETY 60 tablet 0  . cyclobenzaprine (FLEXERIL) 10 MG tablet Take 1 tablet (10 mg total) by mouth 2 (two) times daily as needed for muscle spasms (sedation precautions). 30 tablet 1  . dexlansoprazole (DEXILANT) 60 MG capsule TAKE 1 CAPSULE BY MOUTH EVERY DAY 90 capsule 1  . dextromethorphan-guaiFENesin (MUCINEX DM) 30-600 MG 12hr tablet Take 1 tablet by mouth as needed for cough.    . docusate sodium (COLACE) 100 MG capsule Take 1 capsule (100 mg total) by mouth 2 (two) times daily. 60 capsule 0  . fluticasone (FLONASE) 50 MCG/ACT nasal spray Place into both nostrils daily.    Marland Kitchen glucose blood (ONE TOUCH ULTRA TEST) test strip Use to check sugar twice daily and as needed. Dx: E11.65 300 each 3  . ibuprofen (ADVIL,MOTRIN) 600 MG tablet Take 600 mg by mouth daily.    Elmore Guise Devices (ONE TOUCH DELICA LANCING DEV) MISC  Use as directed with lancets and one touch meter E11.65 1 each 0  . linaclotide (LINZESS) 145 MCG CAPS capsule Take 1 capsule (145 mcg total) by mouth daily before breakfast. 30 capsule 0  . liraglutide (VICTOZA) 18 MG/3ML SOPN Inject 0.1 mLs (0.6 mg total) into the skin daily. 2 pen 0  . magnesium oxide (MAG-OX) 400 MG tablet Take 400 mg by mouth daily.    . meclizine (ANTIVERT) 12.5 MG tablet Take 12.5 mg by mouth 3 (three) times daily as needed for dizziness.    . Multiple Vitamin (MULTIVITAMIN) capsule Take 1 capsule by mouth daily.    Glory Rosebush DELICA LANCETS 01S MISC Check blood sugar twice daily and as directed. Dx E11.65 200 each 0  . pioglitazone (ACTOS) 30 MG tablet TAKE 1 TABLET BY MOUTH EVERY DAY 90 tablet 1  . polyethylene glycol powder (GLYCOLAX/MIRALAX) 17 GM/SCOOP powder Take 17 g by mouth daily. 3350 g 0  . POTASSIUM PO Take by mouth daily.    . ranitidine (ZANTAC) 150 MG tablet Take 150  mg by mouth at bedtime.    . TRADJENTA 5 MG TABS tablet TAKE 1 TABLET BY MOUTH EVERY DAY 30 tablet 5  . Venlafaxine HCl 225 MG TB24 TAKE 1 TABLET (225 MG TOTAL) BY MOUTH DAILY. 90 tablet 0   Current Facility-Administered Medications on File Prior to Visit  Medication Dose Route Frequency Provider Last Rate Last Dose  . diclofenac sodium (VOLTAREN) 1 % transdermal gel 2 g  2 g Topical QID Jessy Oto, MD        PAST MEDICAL HISTORY: Past Medical History:  Diagnosis Date  . Allergy   . Anemia   . Anxiety   . Barrett esophagus    Don Bulla, have not received EGD report  . Chronic back pain   . Constipation   . Depression with anxiety   . Diabetes mellitus without complication ([place])    DSME 04/2014  . Edema, lower extremity   . Fatty liver   . Gastric polyp 05/2012   h/o EGD  . GERD (gastroesophageal reflux disease)   . History of Helicobacter pylori infection   . HLD (hyperlipidemia)   . Hyperlipidemia   . Vitamin D deficiency     PAST SURGICAL HISTORY: Past Surgical  History:  Procedure Laterality Date  . CESAREAN SECTION  6/95, 4/98  . DILATION AND CURETTAGE OF UTERUS  2011  . ESOPHAGOGASTRODUODENOSCOPY  2012?   [facility]  . ESOPHAGOGASTRODUODENOSCOPY  10/2017   [facility]NL Ardis Hughs)  . LUMBAR DISC SURGERY  03/1995  . NASAL SINUS SURGERY  03/2001    SOCIAL HISTORY: Social History   Tobacco Use  . Smoking status: Never Smoker  . Smokeless tobacco: Never Used  Substance Use Topics  . Alcohol use: Yes    Alcohol/week: 0.0 standard drinks    Comment: occasional  . Drug use: No    FAMILY HISTORY: Family History  Problem Relation Age of Onset  . Breast cancer Maternal Grandmother 32  . Dementia Maternal Grandmother   . Heart disease Paternal Grandfather   . Other Sister        Postural Orthostatic Tachycardia Syndrome  . Supraventricular tachycardia Sister   . Other Maternal Aunt        hysterectomy for irregular bleed with abnormal pap  . Hyperlipidemia Mother   . Alzheimer's disease Maternal Grandfather     ROS: Review of Systems  Constitutional: Positive for weight loss. Negative for malaise/fatigue.  Gastrointestinal: Negative for nausea and vomiting.  Musculoskeletal:       Negative for muscle weakness  Endo/Heme/Allergies:       Negative for hypoglycemia    PHYSICAL EXAM: Blood pressure 129/65, pulse 84, temperature 98.3 F (36.8 C), temperature source Oral, height 5\' 4"  (1.626 m), weight 267 lb (121.1 kg), last menstrual period 04/08/2019, SpO2 99 %. Body mass index is 45.83 kg/m. Physical Exam  RECENT LABS AND TESTS: BMET    Component Value Date/Time   NA 140 04/22/2019 1128   K 4.4 04/22/2019 1128   CL 99 04/22/2019 1128   CO2 25 04/22/2019 1128   GLUCOSE 89 04/22/2019 1128   GLUCOSE 89 05/16/2018 1747   BUN 11 04/22/2019 1128   CREATININE 0.75 04/22/2019 1128   CREATININE 0.68 03/28/2014 1632   CALCIUM 9.0 04/22/2019 1128   GFRNONAA 94 04/22/2019 1128   GFRAA 108 04/22/2019 1128   Lab  Results  Component Value Date   HGBA1C 5.9 (H) 04/22/2019   HGBA1C 5.9 (H) 11/28/2018   HGBA1C 5.6  08/24/2018   HGBA1C 6.4 05/16/2018   HGBA1C 6.3 12/11/2017   Lab Results  Component Value Date   INSULIN 9.8 11/28/2018   INSULIN 20.0 07/26/2018   CBC    Component Value Date/Time   WBC 8.9 09/01/2016 1142   RBC 4.39 09/01/2016 1142   HGB 13.2 09/01/2016 1142   HCT 39.3 09/01/2016 1142   PLT 313.0 09/01/2016 1142   MCV 89.6 09/01/2016 1142   MCH 28.0 06/21/2014 0309   MCHC 33.7 09/01/2016 1142   RDW 15.2 09/01/2016 1142   LYMPHSABS 2.4 09/01/2016 1142   MONOABS 0.2 09/01/2016 1142   EOSABS 0.2 09/01/2016 1142   BASOSABS 0.0 09/01/2016 1142   Iron/TIBC/Ferritin/ %Sat    Component Value Date/Time   IRON 62 05/30/2016 1307   FERRITIN 55.2 05/30/2016 1307   IRONPCTSAT 16.4 (L) 05/30/2016 1307   Lipid Panel     Component Value Date/Time   CHOL 133 11/28/2018 1550   CHOL 123 10/30/2012   TRIG 136 11/28/2018 1550   TRIG 124 10/30/2012   HDL 46 11/28/2018 1550   CHOLHDL 3 05/16/2018 1747   VLDL 24.8 05/16/2018 1747   LDLCALC 60 11/28/2018 1550   LDLCALC 59 10/30/2012   LDLDIRECT 73.0 04/18/2017 0847   Hepatic Function Panel     Component Value Date/Time   PROT 6.4 04/22/2019 1128   ALBUMIN 4.3 04/22/2019 1128   AST 26 04/22/2019 1128   ALT 23 04/22/2019 1128   ALKPHOS 97 04/22/2019 1128   BILITOT 0.4 04/22/2019 1128      Component Value Date/Time   TSH 2.42 05/16/2018 1747   TSH 1.80 04/07/2016 1632   TSH 2.351 03/28/2014 1632     Ref. Range 04/22/2019 11:28  Vitamin D, 25-Hydroxy Latest Ref Range: 30.0 - 100.0 ng/mL 43.4     OBESITY BEHAVIORAL INTERVENTION VISIT  Today's visit was # 13   Starting weight: 279 lbs Starting date: 07/26/18 Today's weight : Weight: 267 lb (121.1 kg)  Today's date: 05/08/19 Total lbs lost to date: 12 lbs At least 15 minutes were spent on discussing the following behavioral intervention visit.   ASK: We discussed the  diagnosis of obesity with [name] today and Alejandrina agreed to give Lisa Curry permission to discuss obesity behavioral modification therapy today.  ASSESS: Lisa Curry has the diagnosis of obesity and her BMI today is 45.81 Adiyah is in the action stage of change   ADVISE: Yessica was educated on the multiple health risks of obesity as well as the benefit of weight loss to improve her health. She was advised of the need for long term treatment and the importance of lifestyle modifications to improve her current health and to decrease her risk of future health problems.  AGREE: Multiple dietary modification options and treatment options were discussed and  Rochanda agreed to follow the recommendations documented in the above note.  ARRANGE: Chane was educated on the importance of frequent visits to treat obesity as outlined per CMS and USPSTF guidelines and agreed to schedule her next follow up appointment today.  Leary Roca, am acting as transcriptionist for [employer], DO   I have reviewed the above documentation for accuracy and completeness, and I agree with the above. -Jearld Lesch, DO

## 2019-05-13 ENCOUNTER — Other Ambulatory Visit (INDEPENDENT_AMBULATORY_CARE_PROVIDER_SITE_OTHER): Payer: Self-pay

## 2019-05-13 DIAGNOSIS — E119 Type 2 diabetes mellitus without complications: Secondary | ICD-10-CM

## 2019-05-13 MED ORDER — VICTOZA 18 MG/3ML ~~LOC~~ SOPN: 0.6000 mg | PEN_INJECTOR | Freq: Every day | SUBCUTANEOUS | 0 refills | Status: DC

## 2019-05-13 MED ORDER — VITAMIN D (ERGOCALCIFEROL) 1.25 MG (50000 UNIT) PO CAPS: 50000.0000 [IU] | ORAL_CAPSULE | ORAL | 0 refills | Status: DC

## 2019-05-17 DIAGNOSIS — F339 Major depressive disorder, recurrent, unspecified: Secondary | ICD-10-CM | POA: Diagnosis not present

## 2019-05-17 DIAGNOSIS — F411 Generalized anxiety disorder: Secondary | ICD-10-CM | POA: Diagnosis not present

## 2019-05-22 ENCOUNTER — Encounter (INDEPENDENT_AMBULATORY_CARE_PROVIDER_SITE_OTHER): Payer: Self-pay | Admitting: Bariatrics

## 2019-05-22 ENCOUNTER — Other Ambulatory Visit: Payer: Self-pay

## 2019-05-22 ENCOUNTER — Ambulatory Visit (INDEPENDENT_AMBULATORY_CARE_PROVIDER_SITE_OTHER): Payer: BC Managed Care – PPO | Admitting: Bariatrics

## 2019-05-22 VITALS — BP 132/81 | HR 83 | Temp 98.4°F | Ht 64.0 in | Wt 267.0 lb

## 2019-05-22 DIAGNOSIS — Z6841 Body Mass Index (BMI) 40.0 and over, adult: Secondary | ICD-10-CM

## 2019-05-22 DIAGNOSIS — Z9189 Other specified personal risk factors, not elsewhere classified: Secondary | ICD-10-CM

## 2019-05-22 DIAGNOSIS — E559 Vitamin D deficiency, unspecified: Secondary | ICD-10-CM

## 2019-05-22 DIAGNOSIS — K5909 Other constipation: Secondary | ICD-10-CM | POA: Diagnosis not present

## 2019-05-22 DIAGNOSIS — E119 Type 2 diabetes mellitus without complications: Secondary | ICD-10-CM

## 2019-05-22 MED ORDER — LINACLOTIDE 145 MCG PO CAPS: 145.0000 ug | ORAL_CAPSULE | Freq: Every day | ORAL | 0 refills | Status: DC

## 2019-05-22 NOTE — Progress Notes (Signed)
Office: [phone]  /  Fax: [phone]   HPI:   Chief Complaint: OBESITY Lisa Curry is here to discuss her progress with her obesity treatment plan. She is on the Category 3 plan and is following her eating plan approximately 60% of the time. She states she is walking 5,000 steps 3 times per week. Lisa Curry's weight has stayed the same, but she has done reasonably well overall. Her weight is 267 lb (121.1 kg) today and has not lost weight since her last visit. She has lost 12 lbs since starting treatment with [place].  Constipation Lisa Curry states the Linzess is helping with bowel movements and having therm every 3-4 days with complete emptying.  Diabetes II Lisa Curry has a diagnosis of diabetes type II. Lisa Curry states fasting blood sugars range between 100's and 140's with 2-hour postprandials in the range of 100 to 110. Last A1c was in the prediabetes range at 5.9 on 04/22/2019 with an insulin of 9.8 on 11/28/2018. She has been working on intensive lifestyle modifications including diet, exercise, and weight loss to help control her blood glucose levels.  Vitamin D deficiency Lisa Curry has a diagnosis of Vitamin D deficiency. Last Vitamin D 43.4 on 04/22/2019. She is currently taking Vit D and denies nausea, vomiting or muscle weakness.  At risk for osteopenia and osteoporosis Lisa Curry is at higher risk of osteopenia and osteoporosis due to Vitamin D deficiency.   ASSESSMENT AND PLAN:  Other constipation - Plan: linaclotide (LINZESS) 145 MCG CAPS capsule  Type 2 diabetes mellitus without complication, without long-term current use of insulin (HCC)  Vitamin D deficiency  At risk for osteoporosis  Class 3 severe obesity with serious comorbidity and body mass index (BMI) of 45.0 to 49.9 in adult, unspecified obesity type ([name])  PLAN:  Constipation Lisa Curry was informed decrease bowel movement frequency is normal while losing weight, but stools should not be hard or painful. She was advised to increase her H20  intake and work on increasing her fiber intake. High fiber foods were discussed today. She was given a prescription for Linzess 145 mcg 1 daily with breakfast #30 with 0 refills. She will follow-up with [place] as directed.   Diabetes II Lisa Curry has been given extensive diabetes education by myself today including ideal fasting and post-prandial blood glucose readings, individual ideal HgA1c goals  and hypoglycemia prevention. We discussed the importance of good blood sugar control to decrease the likelihood of diabetic complications such as nephropathy, neuropathy, limb loss, blindness, coronary artery disease, and death. We discussed the importance of intensive lifestyle modification including diet, exercise and weight loss as the first line treatment for diabetes. Lisa Curry agrees to continue her diabetes medications, continue to track fasting blood sugars and 2-hour postprandials, and will follow-up at the agreed upon time.  Vitamin D Deficiency Lisa Curry was informed that low Vitamin D levels contributes to fatigue and are associated with obesity, breast, and colon cancer. She agrees to continue taking Vit D and will follow-up for routine testing of Vitamin D, at least 2-3 times per year. She was informed of the risk of over-replacement of Vitamin D and agrees to not increase her dose unless she discusses this with [place] first. Lisa Curry agrees to follow-up with our clinic in 2 weeks.  At risk for osteopenia and osteoporosis Lisa Curry was given extended  (15 minutes) osteoporosis prevention counseling today. Lisa Curry is at risk for osteopenia and osteoporosis due to her Vitamin D deficiency. She was encouraged to take her Vitamin D and follow her higher calcium  diet and increase strengthening exercise to help strengthen her bones and decrease her risk of osteopenia and osteoporosis.  Obesity Lisa Curry is currently in the action stage of change. As such, her goal is to continue with weight loss efforts. She has agreed to follow the  Category 3 plan. Lisa Curry will work on meal planning, intentional eating, and will keep water with her at all times. Lisa Curry has been instructed to increases her walking to 7,000 steps a day for weight loss and overall health benefits. We discussed the following Behavioral Modification Strategies today: increasing lean protein intake, decreasing simple carbohydrates, increasing vegetables, increase H20 intake, decrease eating out, no skipping meals, work on meal planning and easy cooking plans, keeping healthy foods in the home, and planning for success.  Lisa Curry has agreed to follow-up with our clinic in 2 weeks. She was informed of the importance of frequent follow-up visits to maximize her success with intensive lifestyle modifications for her multiple health conditions.  ALLERGIES: Allergies  Allergen Reactions  . Biaxin [Clarithromycin] Hives  . Metformin And Related Diarrhea    MEDICATIONS: Current Outpatient Medications on File Prior to Visit  Medication Sig Dispense Refill  . acetaminophen-codeine (TYLENOL #3) 300-30 MG tablet Take 1 tablet by mouth every 8 (eight) hours as needed for moderate pain. 15 tablet 0  . Aspirin-Acetaminophen-Caffeine (EXCEDRIN PO) Take 1 capsule by mouth daily. As needed    . atorvastatin (LIPITOR) 20 MG tablet TAKE 1 TABLET (20 MG TOTAL) BY MOUTH DAILY AT 6 PM. 90 tablet 2  . BD PEN NEEDLE MICRO U/F 32G X 6 MM MISC USE ONCE DAILY FOR VICTOZA 100 each 0  . buPROPion (WELLBUTRIN SR) 150 MG 12 hr tablet TAKE 1 TABLET BY MOUTH TWICE A DAY 180 tablet 0  . Cholecalciferol (VITAMIN D3) 125 MCG (5000 UT) TABS Take 1 tablet by mouth daily.    . clonazePAM (KLONOPIN) 0.5 MG tablet TAKE 1 TABLET BY MOUTH TWICE A DAY AS NEEDED FOR ANXIETY 60 tablet 0  . cyclobenzaprine (FLEXERIL) 10 MG tablet Take 1 tablet (10 mg total) by mouth 2 (two) times daily as needed for muscle spasms (sedation precautions). 30 tablet 1  . dexlansoprazole (DEXILANT) 60 MG capsule TAKE 1 CAPSULE BY  MOUTH EVERY DAY 90 capsule 1  . dextromethorphan-guaiFENesin (MUCINEX DM) 30-600 MG 12hr tablet Take 1 tablet by mouth as needed for cough.    . docusate sodium (COLACE) 100 MG capsule Take 1 capsule (100 mg total) by mouth 2 (two) times daily. 60 capsule 0  . fluticasone (FLONASE) 50 MCG/ACT nasal spray Place into both nostrils daily.    Marland Kitchen glucose blood (ONE TOUCH ULTRA TEST) test strip Use to check sugar twice daily and as needed. Dx: E11.65 300 each 3  . ibuprofen (ADVIL,MOTRIN) 600 MG tablet Take 600 mg by mouth daily.    Elmore Guise Devices (ONE TOUCH DELICA LANCING DEV) MISC Use as directed with lancets and one touch meter E11.65 1 each 0  . liraglutide (VICTOZA) 18 MG/3ML SOPN Inject 0.1 mLs (0.6 mg total) into the skin daily. 3 pen 0  . magnesium oxide (MAG-OX) 400 MG tablet Take 400 mg by mouth daily.    . meclizine (ANTIVERT) 12.5 MG tablet Take 12.5 mg by mouth 3 (three) times daily as needed for dizziness.    . Multiple Vitamin (MULTIVITAMIN) capsule Take 1 capsule by mouth daily.    Glory Rosebush DELICA LANCETS [id number] MISC Check blood sugar twice daily and as directed. Dx E11.65 200  each 0  . pioglitazone (ACTOS) 30 MG tablet TAKE 1 TABLET BY MOUTH EVERY DAY 90 tablet 1  . polyethylene glycol powder (GLYCOLAX/MIRALAX) 17 GM/SCOOP powder Take 17 g by mouth daily. 3350 g 0  . POTASSIUM PO Take by mouth daily.    . ranitidine (ZANTAC) 150 MG tablet Take 150 mg by mouth at bedtime.    . TRADJENTA 5 MG TABS tablet TAKE 1 TABLET BY MOUTH EVERY DAY 30 tablet 5  . Venlafaxine HCl 225 MG TB24 TAKE 1 TABLET (225 MG TOTAL) BY MOUTH DAILY. 90 tablet 0  . Vitamin D, Ergocalciferol, (DRISDOL) 1.25 MG (50000 UT) CAPS capsule Take 1 capsule (50,000 Units total) by mouth every 7 (seven) days. 4 capsule 0   Current Facility-Administered Medications on File Prior to Visit  Medication Dose Route Frequency Provider Last Rate Last Dose  . diclofenac sodium (VOLTAREN) 1 % transdermal gel 2 g  2 g Topical QID  Jessy Oto, MD        PAST MEDICAL HISTORY: Past Medical History:  Diagnosis Date  . Allergy   . Anemia   . Anxiety   . Barrett esophagus    Don Bulla, have not received EGD report  . Chronic back pain   . Constipation   . Depression with anxiety   . Diabetes mellitus without complication ([place])    DSME 04/2014  . Edema, lower extremity   . Fatty liver   . Gastric polyp 05/2012   h/o EGD  . GERD (gastroesophageal reflux disease)   . History of Helicobacter pylori infection   . HLD (hyperlipidemia)   . Hyperlipidemia   . Vitamin D deficiency     PAST SURGICAL HISTORY: Past Surgical History:  Procedure Laterality Date  . CESAREAN SECTION  6/95, 4/98  . DILATION AND CURETTAGE OF UTERUS  2011  . ESOPHAGOGASTRODUODENOSCOPY  2012?   [facility] at [facility]  . ESOPHAGOGASTRODUODENOSCOPY  10/2017   [facility] Ar[facility] Hughs)  . LUMBAR DISC SURGERY  03/1995  . NASAL SINUS SURGERY  03/2001    SOCIAL HISTORY: Social History   Tobacco Use  . Smoking status: Never Smoker  . Smokeless tobacco: Never Used  Substance Use Topics  . Alcohol use: Yes    Alcohol/week: 0.0 standard drinks    Comment: occasional  . Drug use: No    FAMILY HISTORY: Family History  Problem Relation Age of Onset  . Breast cancer Maternal Grandmother 44  . Dementia Maternal Grandmother   . Heart disease Paternal Grandfather   . Other Sister        Postural Orthostatic Tachycardia Syndrome  . Supraventricular tachycardia Sister   . Other Maternal Aunt        hysterectomy for irregular bleed with abnormal pap  . Hyperlipidemia Mother   . Alzheimer's disease Maternal Grandfather    ROS: Review of Systems  Gastrointestinal: Positive for constipation. Negative for nausea and vomiting.  Musculoskeletal:       Negative for muscle weakness.   PHYSICAL EXAM: Blood pressure 132/81, pulse 83, temperature 98.4 F (36.9 C), temperature source Oral, height 5\' 4"  (1.626 m), weight 267 lb (121.1 kg), last  menstrual period 05/09/2019, SpO2 98 %. Body mass index is 45.83 kg/m. Physical Exam Vitals signs reviewed.  Constitutional:      Appearance: Normal appearance. She is obese.  Cardiovascular:     Rate and Rhythm: Normal rate.     Pulses: Normal pulses.  Pulmonary:     Effort: Pulmonary effort is  normal.     Breath sounds: Normal breath sounds.  Musculoskeletal: Normal range of motion.  Skin:    General: Skin is warm and dry.  Neurological:     Mental Status: She is alert and oriented to person, place, and time.  Psychiatric:        Behavior: Behavior normal.   RECENT LABS AND TESTS: BMET    Component Value Date/Time   NA 140 04/22/2019 1128   K 4.4 04/22/2019 1128   CL 99 04/22/2019 1128   CO2 25 04/22/2019 1128   GLUCOSE 89 04/22/2019 1128   GLUCOSE 89 05/16/2018 1747   BUN 11 04/22/2019 1128   CREATININE 0.75 04/22/2019 1128   CREATININE 0.68 03/28/2014 1632   CALCIUM 9.0 04/22/2019 1128   GFRNONAA 94 04/22/2019 1128   GFRAA 108 04/22/2019 1128   Lab Results  Component Value Date   HGBA1C 5.9 (H) 04/22/2019   HGBA1C 5.9 (H) 11/28/2018   HGBA1C 5.6 08/24/2018   HGBA1C 6.4 05/16/2018   HGBA1C 6.3 12/11/2017   Lab Results  Component Value Date   INSULIN 9.8 11/28/2018   INSULIN 20.0 07/26/2018   CBC    Component Value Date/Time   WBC 8.9 09/01/2016 1142   RBC 4.39 09/01/2016 1142   HGB 13.2 09/01/2016 1142   HCT 39.3 09/01/2016 1142   PLT 313.0 09/01/2016 1142   MCV 89.6 09/01/2016 1142   MCH 28.0 06/21/2014 0309   MCHC 33.7 09/01/2016 1142   RDW 15.2 09/01/2016 1142   LYMPHSABS 2.4 09/01/2016 1142   MONOABS 0.2 09/01/2016 1142   EOSABS 0.2 09/01/2016 1142   BASOSABS 0.0 09/01/2016 1142   Iron/TIBC/Ferritin/ %Sat    Component Value Date/Time   IRON 62 05/30/2016 1307   FERRITIN 55.2 05/30/2016 1307   IRONPCTSAT 16.4 (L) 05/30/2016 1307   Lipid Panel     Component Value Date/Time   CHOL 133 11/28/2018 1550   CHOL 123 10/30/2012   TRIG 136  11/28/2018 1550   TRIG 124 10/30/2012   HDL 46 11/28/2018 1550   CHOLHDL 3 05/16/2018 1747   VLDL 24.8 05/16/2018 1747   LDLCALC 60 11/28/2018 1550   LDLCALC 59 10/30/2012   LDLDIRECT 73.0 04/18/2017 0847   Hepatic Function Panel     Component Value Date/Time   PROT 6.4 04/22/2019 1128   ALBUMIN 4.3 04/22/2019 1128   AST 26 04/22/2019 1128   ALT 23 04/22/2019 1128   ALKPHOS 97 04/22/2019 1128   BILITOT 0.4 04/22/2019 1128      Component Value Date/Time   TSH 2.42 05/16/2018 1747   TSH 1.80 04/07/2016 1632   TSH 2.351 03/28/2014 1632   Results for Lisa, Curry (MRN [id number]) as of 05/22/2019 11:06  Ref. Range 04/22/2019 11:28  Vitamin D, 25-Hydroxy Latest Ref Range: 30.0 - 100.0 ng/mL 43.4   OBESITY BEHAVIORAL INTERVENTION VISIT  Today's visit was #14  Starting weight: 279 lbs Starting date: 07/26/2018 Today's weight: 267 lbs  Today's date: 05/22/2019 Total lbs lost to date: 12    05/22/2019  Height 5\' 4"  (1.626 m)  Weight 267 lb (121.1 kg)  BMI (Calculated) 45.81  BLOOD PRESSURE - SYSTOLIC [id number]  BLOOD PRESSURE - DIASTOLIC 81   Body Fat % [id number] %  Total Body Water (lbs) 96.4 lbs   ASK: We discussed the diagnosis of obesity with Lisa Curry today and Lisa Curry agreed to give [place] permission to discuss obesity behavioral modification therapy today.  ASSESS: Lisa Curry has the diagnosis of obesity and  her BMI today is 45.9. Lisa Curry is in the action stage of change.   ADVISE: Lisa Curry was educated on the multiple health risks of obesity as well as the benefit of weight loss to improve her health. She was advised of the need for long term treatment and the importance of lifestyle modifications to improve her current health and to decrease her risk of future health problems.  AGREE: Multiple dietary modification options and treatment options were discussed and  Lisa Curry agreed to follow the recommendations documented in the above note.  ARRANGE: Lisa Curry was educated on the importance of frequent  visits to treat obesity as outlined per CMS and USPSTF guidelines and agreed to schedule her next follow up appointment today.  Migdalia Dk, am acting as Location manager for [employer], DO  I have reviewed the above documentation for accuracy and completeness, and I agree with the above. -Jearld Lesch, DO

## 2019-05-23 ENCOUNTER — Other Ambulatory Visit: Payer: Self-pay | Admitting: Family Medicine

## 2019-05-24 ENCOUNTER — Telehealth: Payer: Self-pay

## 2019-05-24 NOTE — Telephone Encounter (Signed)
Pt received a letter form her insurance that they will not longer cover Trajenta after 06-19-19. They will cover Januvia. Pt uses [facility]. She has 2 weeks left of Trajenta, so she will be able to get another 30 days prior to the change over. Wanted to give Dr Darnell Level a head's up.

## 2019-05-24 NOTE — Telephone Encounter (Signed)
Name of Medication: Clonazepam Name of Pharmacy: [facility] Last Fill or Written Date and Quantity: 04/08/19, #60 Last Office Visit and Type: 01/10/19, acute pain & MDD f/u Next Office Visit and Type: none Last Controlled Substance Agreement Date: 04/24/17 Last UDS: 10/16/14

## 2019-05-24 NOTE — Telephone Encounter (Signed)
Ok - call us back at end of Sept and will switch meds then. Thanks for the heads up.

## 2019-05-24 NOTE — Telephone Encounter (Signed)
Eprescribed.

## 2019-05-28 ENCOUNTER — Encounter (INDEPENDENT_AMBULATORY_CARE_PROVIDER_SITE_OTHER): Payer: Self-pay | Admitting: Physician Assistant

## 2019-05-28 ENCOUNTER — Encounter: Payer: Self-pay | Admitting: Family Medicine

## 2019-05-28 NOTE — Telephone Encounter (Signed)
Please advise 

## 2019-05-28 NOTE — Telephone Encounter (Signed)
Spoke with pt relaying Dr. Synthia Innocent message.  Pt verbalizes understanding but is requesting a new rx for Januvia be sent to the pharmacy to not be filled before 06/11/19.  Says she has a lot going on and this will cut down on confusion for her.

## 2019-05-28 NOTE — Telephone Encounter (Signed)
See TE, 05/28/19.

## 2019-05-29 MED ORDER — SITAGLIPTIN PHOSPHATE 50 MG PO TABS: 50.0000 mg | ORAL_TABLET | Freq: Every day | ORAL | 3 refills | Status: DC

## 2019-05-29 NOTE — Addendum Note (Signed)
Addended by: Ria Bush on: 05/29/2019 09:47 AM   Modules accepted: Orders

## 2019-05-29 NOTE — Telephone Encounter (Signed)
Spoke with pt notifying her rx was sent in.  Pt expresses her thanks.  

## 2019-05-29 NOTE — Telephone Encounter (Signed)
sent in

## 2019-05-31 DIAGNOSIS — F411 Generalized anxiety disorder: Secondary | ICD-10-CM | POA: Diagnosis not present

## 2019-05-31 DIAGNOSIS — F339 Major depressive disorder, recurrent, unspecified: Secondary | ICD-10-CM | POA: Diagnosis not present

## 2019-06-03 ENCOUNTER — Other Ambulatory Visit (INDEPENDENT_AMBULATORY_CARE_PROVIDER_SITE_OTHER): Payer: Self-pay | Admitting: Bariatrics

## 2019-06-03 DIAGNOSIS — E559 Vitamin D deficiency, unspecified: Secondary | ICD-10-CM

## 2019-06-06 ENCOUNTER — Ambulatory Visit (INDEPENDENT_AMBULATORY_CARE_PROVIDER_SITE_OTHER): Payer: BC Managed Care – PPO | Admitting: Physician Assistant

## 2019-06-10 ENCOUNTER — Other Ambulatory Visit: Payer: Self-pay

## 2019-06-10 ENCOUNTER — Ambulatory Visit (INDEPENDENT_AMBULATORY_CARE_PROVIDER_SITE_OTHER): Payer: BC Managed Care – PPO | Admitting: Physician Assistant

## 2019-06-10 VITALS — BP 118/73 | HR 89 | Temp 98.5°F | Ht 64.0 in | Wt 265.0 lb

## 2019-06-10 DIAGNOSIS — Z9189 Other specified personal risk factors, not elsewhere classified: Secondary | ICD-10-CM | POA: Diagnosis not present

## 2019-06-10 DIAGNOSIS — Z6841 Body Mass Index (BMI) 40.0 and over, adult: Secondary | ICD-10-CM

## 2019-06-10 DIAGNOSIS — K5909 Other constipation: Secondary | ICD-10-CM

## 2019-06-10 DIAGNOSIS — E559 Vitamin D deficiency, unspecified: Secondary | ICD-10-CM

## 2019-06-11 DIAGNOSIS — F339 Major depressive disorder, recurrent, unspecified: Secondary | ICD-10-CM | POA: Diagnosis not present

## 2019-06-11 DIAGNOSIS — F411 Generalized anxiety disorder: Secondary | ICD-10-CM | POA: Diagnosis not present

## 2019-06-11 MED ORDER — LINACLOTIDE 145 MCG PO CAPS: 145.0000 ug | ORAL_CAPSULE | Freq: Every day | ORAL | 0 refills | Status: DC

## 2019-06-11 MED ORDER — VITAMIN D (ERGOCALCIFEROL) 1.25 MG (50000 UNIT) PO CAPS: 50000.0000 [IU] | ORAL_CAPSULE | ORAL | 0 refills | Status: DC

## 2019-06-12 NOTE — Progress Notes (Signed)
Office: [phone]  /  Fax: [phone]   HPI:   Chief Complaint: OBESITY Billiejo is here to discuss her progress with her obesity treatment plan. She is on the Category 3 plan and is following her eating plan approximately 30 % of the time. She states she is exercising 0 minutes 0 times per week. Emberlin reports that she is under a lot of stress at home, taking care of her grandmother. She was trying to stick with the Category 3 plan, but she is also journaling. Her weight is 265 lb (120.2 kg) today and has had a weight loss of 2 pounds over a period of 2 to 3 weeks since her last visit. She has lost 14 lbs since starting treatment with [place].  Vitamin D deficiency Byanka has a diagnosis of vitamin D deficiency. Lauralye is currently taking vit D and she denies nausea, vomiting or muscle weakness.  At risk for osteopenia and osteoporosis Nykita is at higher risk of osteopenia and osteoporosis due to vitamin D deficiency.   Constipation Contrina has a diagnosis of constipation and she is on Linzess. She has no blood in her stool or black stool.   ASSESSMENT AND PLAN:  Vitamin D deficiency - Plan: Vitamin D, Ergocalciferol, (DRISDOL) 1.25 MG (50000 UT) CAPS capsule  Other constipation - Plan: linaclotide (LINZESS) 145 MCG CAPS capsule  At risk for osteoporosis  Class 3 severe obesity with serious comorbidity and body mass index (BMI) of 45.0 to 49.9 in adult, unspecified obesity type ([place])  PLAN:  Vitamin D Deficiency Ayesha was informed that low vitamin D levels contributes to fatigue and are associated with obesity, breast, and colon cancer. Necole agrees to continue to take prescription Vit D @50 ,000 IU every week #4 with no refills and she will follow up for routine testing of vitamin D, at least 2-3 times per year. She was informed of the risk of over-replacement of vitamin D and agrees to not increase her dose unless she discusses this with [place] first. Smrithi agrees to follow up with our clinic in 2  weeks.  At risk for osteopenia and osteoporosis Romani was given extended  (15 minutes) osteoporosis prevention counseling today. Iysis is at risk for osteopenia and osteoporosis due to her vitamin D deficiency. She was encouraged to take her vitamin D and follow her higher calcium diet and increase strengthening exercise to help strengthen her bones and decrease her risk of osteopenia and osteoporosis.  Constipation Jenniger agrees to continue Linzess 145 mcg once daily #30 with no refills and follow up as directed.  Obesity Jaiylah is currently in the action stage of change. As such, her goal is to continue with weight loss efforts She has agreed to keep a food journal with 1500 calories and 95 grams of protein daily Shinika has been instructed to work up to a goal of 150 minutes of combined cardio and strengthening exercise per week for weight loss and overall health benefits. We discussed the following Behavioral Modification Strategies today: keeping healthy foods in the home and work on meal planning and easy cooking plans  Jilian has agreed to follow up with our clinic in 2 weeks. She was informed of the importance of frequent follow up visits to maximize her success with intensive lifestyle modifications for her multiple health conditions.  ALLERGIES: Allergies  Allergen Reactions   Biaxin [Clarithromycin] Hives   Metformin And Related Diarrhea    MEDICATIONS: Current Outpatient Medications on File Prior to Visit  Medication Sig Dispense  Refill   acetaminophen-codeine (TYLENOL #3) 300-30 MG tablet Take 1 tablet by mouth every 8 (eight) hours as needed for moderate pain. 15 tablet 0   Aspirin-Acetaminophen-Caffeine (EXCEDRIN PO) Take 1 capsule by mouth daily. As needed     atorvastatin (LIPITOR) 20 MG tablet TAKE 1 TABLET (20 MG TOTAL) BY MOUTH DAILY AT 6 PM. 90 tablet 2   BD PEN NEEDLE MICRO U/F 32G X 6 MM MISC USE ONCE DAILY FOR VICTOZA 100 each 0   buPROPion (WELLBUTRIN SR) 150 MG  12 hr tablet TAKE 1 TABLET BY MOUTH TWICE A DAY 180 tablet 0   Cholecalciferol (VITAMIN D3) 125 MCG (5000 UT) TABS Take 1 tablet by mouth daily.     clonazePAM (KLONOPIN) 0.5 MG tablet TAKE 1 TABLET BY MOUTH TWICE A DAY AS NEEDED FOR ANXIETY 60 tablet 0   cyclobenzaprine (FLEXERIL) 10 MG tablet Take 1 tablet (10 mg total) by mouth 2 (two) times daily as needed for muscle spasms (sedation precautions). 30 tablet 1   dexlansoprazole (DEXILANT) 60 MG capsule TAKE 1 CAPSULE BY MOUTH EVERY DAY 90 capsule 1   dextromethorphan-guaiFENesin (MUCINEX DM) 30-600 MG 12hr tablet Take 1 tablet by mouth as needed for cough.     docusate sodium (COLACE) 100 MG capsule Take 1 capsule (100 mg total) by mouth 2 (two) times daily. 60 capsule 0   fluticasone (FLONASE) 50 MCG/ACT nasal spray Place into both nostrils daily.     glucose blood (ONE TOUCH ULTRA TEST) test strip Use to check sugar twice daily and as needed. Dx: E11.65 300 each 3   ibuprofen (ADVIL,MOTRIN) 600 MG tablet Take 600 mg by mouth daily.     Lancet Devices (ONE TOUCH DELICA LANCING DEV) MISC Use as directed with lancets and one touch meter E11.65 1 each 0   liraglutide (VICTOZA) 18 MG/3ML SOPN Inject 0.1 mLs (0.6 mg total) into the skin daily. 3 pen 0   magnesium oxide (MAG-OX) 400 MG tablet Take 400 mg by mouth daily.     meclizine (ANTIVERT) 12.5 MG tablet Take 12.5 mg by mouth 3 (three) times daily as needed for dizziness.     Multiple Vitamin (MULTIVITAMIN) capsule Take 1 capsule by mouth daily.     ONETOUCH DELICA LANCETS 99991111 MISC Check blood sugar twice daily and as directed. Dx E11.65 200 each 0   pioglitazone (ACTOS) 30 MG tablet TAKE 1 TABLET BY MOUTH EVERY DAY 90 tablet 1   polyethylene glycol powder (GLYCOLAX/MIRALAX) 17 GM/SCOOP powder Take 17 g by mouth daily. 3350 g 0   POTASSIUM PO Take by mouth daily.     ranitidine (ZANTAC) 150 MG tablet Take 150 mg by mouth at bedtime.     sitaGLIPtin (JANUVIA) 50 MG tablet  Take 1 tablet (50 mg total) by mouth daily. 30 tablet 3   TRADJENTA 5 MG TABS tablet TAKE 1 TABLET BY MOUTH EVERY DAY 30 tablet 5   Venlafaxine HCl 225 MG TB24 TAKE 1 TABLET (225 MG TOTAL) BY MOUTH DAILY. 90 tablet 0   Current Facility-Administered Medications on File Prior to Visit  Medication Dose Route Frequency Provider Last Rate Last Dose   diclofenac sodium (VOLTAREN) 1 % transdermal gel 2 g  2 g Topical QID Jessy Oto, MD        PAST MEDICAL HISTORY: Past Medical History:  Diagnosis Date   Allergy    Anemia    Anxiety    Barrett esophagus    Don Bulla, have not received  EGD report   Chronic back pain    Constipation    Depression with anxiety    Diabetes mellitus without complication ([place])    DSME 04/2014   Edema, lower extremity    Fatty liver    Gastric polyp 05/2012   h/o EGD   GERD (gastroesophageal reflux disease)    History of Helicobacter pylori infection    HLD (hyperlipidemia)    Hyperlipidemia    Vitamin D deficiency     PAST SURGICAL HISTORY: Past Surgical History:  Procedure Laterality Date   CESAREAN SECTION  6/95, 4/98   DILATION AND CURETTAGE OF UTERUS  2011   ESOPHAGOGASTRODUODENOSCOPY  2012?   [facility] at [facility]   ESOPHAGOGASTRODUODENOSCOPY  10/2017   WNL Ardis Hughs)   Vernal SURGERY  03/1995   NASAL SINUS SURGERY  03/2001    SOCIAL HISTORY: Social History   Tobacco Use   Smoking status: Never Smoker   Smokeless tobacco: Never Used  Substance Use Topics   Alcohol use: Yes    Alcohol/week: 0.0 standard drinks    Comment: occasional   Drug use: No    FAMILY HISTORY: Family History  Problem Relation Age of Onset   Breast cancer Maternal Grandmother 74   Dementia Maternal Grandmother    Heart disease Paternal Grandfather    Other Sister        Postural Orthostatic Tachycardia Syndrome   Supraventricular tachycardia Sister    Other Maternal Aunt        hysterectomy for irregular  bleed with abnormal pap   Hyperlipidemia Mother    Alzheimer's disease Maternal Grandfather     ROS: Review of Systems  Constitutional: Positive for weight loss.  Gastrointestinal: Positive for constipation. Negative for melena, nausea and vomiting.       Negative for hematochezia  Musculoskeletal:       Negative for muscle weakness    PHYSICAL EXAM: Blood pressure 118/73, pulse 89, temperature 98.5 F (36.9 C), temperature source Oral, height 5\' 4"  (1.626 m), weight 265 lb (120.2 kg), SpO2 100 %. Body mass index is 45.49 kg/m. Physical Exam Vitals signs reviewed.  Constitutional:      Appearance: Normal appearance. She is well-developed. She is obese.  Cardiovascular:     Rate and Rhythm: Normal rate.  Pulmonary:     Effort: Pulmonary effort is normal.  Musculoskeletal: Normal range of motion.  Skin:    General: Skin is warm and dry.  Neurological:     Mental Status: She is alert and oriented to person, place, and time.  Psychiatric:        Mood and Affect: Mood normal.        Behavior: Behavior normal.     RECENT LABS AND TESTS: BMET    Component Value Date/Time   NA 140 04/22/2019 1128   K 4.4 04/22/2019 1128   CL 99 04/22/2019 1128   CO2 25 04/22/2019 1128   GLUCOSE 89 04/22/2019 1128   GLUCOSE 89 05/16/2018 1747   BUN 11 04/22/2019 1128   CREATININE 0.75 04/22/2019 1128   CREATININE 0.68 03/28/2014 1632   CALCIUM 9.0 04/22/2019 1128   GFRNONAA 94 04/22/2019 1128   GFRAA 108 04/22/2019 1128   Lab Results  Component Value Date   HGBA1C 5.9 (H) 04/22/2019   HGBA1C 5.9 (H) 11/28/2018   HGBA1C 5.6 08/24/2018   HGBA1C 6.4 05/16/2018   HGBA1C 6.3 12/11/2017   Lab Results  Component Value Date   INSULIN 9.8 11/28/2018  INSULIN 20.0 07/26/2018   CBC    Component Value Date/Time   WBC 8.9 09/01/2016 1142   RBC 4.39 09/01/2016 1142   HGB 13.2 09/01/2016 1142   HCT 39.3 09/01/2016 1142   PLT 313.0 09/01/2016 1142   MCV 89.6 09/01/2016 1142    MCH 28.0 06/21/2014 0309   MCHC 33.7 09/01/2016 1142   RDW 15.2 09/01/2016 1142   LYMPHSABS 2.4 09/01/2016 1142   MONOABS 0.2 09/01/2016 1142   EOSABS 0.2 09/01/2016 1142   BASOSABS 0.0 09/01/2016 1142   Iron/TIBC/Ferritin/ %Sat    Component Value Date/Time   IRON 62 05/30/2016 1307   FERRITIN 55.2 05/30/2016 1307   IRONPCTSAT 16.4 (L) 05/30/2016 1307   Lipid Panel     Component Value Date/Time   CHOL 133 11/28/2018 1550   CHOL 123 10/30/2012   TRIG 136 11/28/2018 1550   TRIG 124 10/30/2012   HDL 46 11/28/2018 1550   CHOLHDL 3 05/16/2018 1747   VLDL 24.8 05/16/2018 1747   LDLCALC 60 11/28/2018 1550   LDLCALC 59 10/30/2012   LDLDIRECT 73.0 04/18/2017 0847   Hepatic Function Panel     Component Value Date/Time   PROT 6.4 04/22/2019 1128   ALBUMIN 4.3 04/22/2019 1128   AST 26 04/22/2019 1128   ALT 23 04/22/2019 1128   ALKPHOS 97 04/22/2019 1128   BILITOT 0.4 04/22/2019 1128      Component Value Date/Time   TSH 2.42 05/16/2018 1747   TSH 1.80 04/07/2016 1632   TSH 2.351 03/28/2014 1632     Ref. Range 04/22/2019 11:28  Vitamin D, 25-Hydroxy Latest Ref Range: 30.0 - 100.0 ng/mL 43.4    OBESITY BEHAVIORAL INTERVENTION VISIT  Today's visit was # 15   Starting weight: 279 lbs Starting date: 07/26/2018 Today's weight : 265 lbs Today's date: 06/10/2019 Total lbs lost to date: 14    06/10/2019  Height 5\' 4"  (1.626 m)  Weight 265 lb (120.2 kg)  BMI (Calculated) 45.46  BLOOD PRESSURE - SYSTOLIC 123456  BLOOD PRESSURE - DIASTOLIC 73   Body Fat % 123456 %  Total Body Water (lbs) 95.8 lbs    ASK: We discussed the diagnosis of obesity with [name] today and [name] agreed to give [place] permission to discuss obesity behavioral modification therapy today.  ASSESS: Kandy has the diagnosis of obesity and her BMI today is 45.46 Wyvetta is in the action stage of change   ADVISE: Arminta was educated on the multiple health risks of obesity as well as the benefit of weight loss to  improve her health. She was advised of the need for long term treatment and the importance of lifestyle modifications to improve her current health and to decrease her risk of future health problems.  AGREE: Multiple dietary modification options and treatment options were discussed and  Kelynn agreed to follow the recommendations documented in the above note.  ARRANGE: Airen was educated on the importance of frequent visits to treat obesity as outlined per CMS and USPSTF guidelines and agreed to schedule her next follow up appointment today.  Corey Skains, am acting as transcriptionist for Abby Potash, PA-C I, Abby Potash, PA-C have reviewed above note and agree with its content

## 2019-06-13 ENCOUNTER — Encounter: Payer: Self-pay | Admitting: Family Medicine

## 2019-06-13 NOTE — Telephone Encounter (Signed)
Does patient needs to schedule an appointment to discuss?

## 2019-06-24 ENCOUNTER — Other Ambulatory Visit: Payer: Self-pay | Admitting: Family Medicine

## 2019-06-24 ENCOUNTER — Ambulatory Visit (INDEPENDENT_AMBULATORY_CARE_PROVIDER_SITE_OTHER): Payer: BC Managed Care – PPO | Admitting: Physician Assistant

## 2019-06-24 NOTE — Telephone Encounter (Signed)
Last OV was an acute back pain on 01/10/19 and no future appts., last filled on 03/29/19 #90 tabs with 0 refills

## 2019-06-25 ENCOUNTER — Other Ambulatory Visit: Payer: Self-pay | Admitting: Family Medicine

## 2019-06-27 ENCOUNTER — Other Ambulatory Visit: Payer: Self-pay | Admitting: Family Medicine

## 2019-06-27 NOTE — Telephone Encounter (Signed)
E-scribed refills.  Pls schedule CPE and labs

## 2019-06-28 ENCOUNTER — Other Ambulatory Visit: Payer: Self-pay | Admitting: Family Medicine

## 2019-06-28 NOTE — Telephone Encounter (Signed)
Patient called and stated that she is really needing the Flexeril and her pain medication sent in She stated she has Chronic Back Problems and she is trying to take care of her grandmother and pulled her back.. Patient would like this sent as soon as possible    [facility] [place]

## 2019-07-01 MED ORDER — CYCLOBENZAPRINE HCL 10 MG PO TABS: 10.0000 mg | ORAL_TABLET | Freq: Two times a day (BID) | ORAL | 1 refills | Status: DC | PRN

## 2019-07-01 NOTE — Telephone Encounter (Signed)
lvm for pt call back and schedule CPE/Labs

## 2019-07-02 ENCOUNTER — Telehealth: Payer: Self-pay | Admitting: Family Medicine

## 2019-07-02 MED ORDER — ACETAMINOPHEN-CODEINE #3 300-30 MG PO TABS: 1.0000 | ORAL_TABLET | Freq: Three times a day (TID) | ORAL | 0 refills | Status: DC | PRN

## 2019-07-02 NOTE — Telephone Encounter (Signed)
Spoke with patient. Patient states she was requesting Tylenol #3 that was giving last time on 01/10/2019 #15 for back pain. Patient states that she had a catch in her back on 06/14/2019 on the left side and was having trouble with pain and catching sensation. She has been helping her grandmother a lot with house work, helping her get up and down so she is still in pain due to all of this. Her left side is better but her right side is hurting now after she turned her knee out while bathing. She feels like maybe she even needs prednisone at this time. I advised patient I would check with Dr Darnell Level but he may want a visit for this. Patient states that she met her deductible with insurance but it has not reflected with insurance company just yet but as soon as it does she would call back and schedule a visit with [place].   She has been seen at [facility] and had lab work done on 04/22/2019 with them if that would be sufficient enough for CPE labs.  LOV with Dr Darnell Level was on 01/10/2019.

## 2019-07-02 NOTE — Telephone Encounter (Signed)
Patient is returning your call.  

## 2019-07-02 NOTE — Addendum Note (Signed)
Addended by: Kris Mouton on: 07/02/2019 11:54 AM   Modules accepted: Orders

## 2019-07-02 NOTE — Telephone Encounter (Signed)
Flexeril refilled yesterday.  plz touch base with patient ensure she received refill.  There was mention of pain med in note - what pain med is she requesting? Tylenol #3?

## 2019-07-02 NOTE — Telephone Encounter (Signed)
eRx Tylenol #3

## 2019-07-02 NOTE — Telephone Encounter (Signed)
Left message for patient to call back  

## 2019-07-02 NOTE — Telephone Encounter (Signed)
Spoke with patient. See refill note

## 2019-07-03 ENCOUNTER — Ambulatory Visit (INDEPENDENT_AMBULATORY_CARE_PROVIDER_SITE_OTHER): Payer: BC Managed Care – PPO | Admitting: Bariatrics

## 2019-07-03 ENCOUNTER — Encounter (INDEPENDENT_AMBULATORY_CARE_PROVIDER_SITE_OTHER): Payer: Self-pay | Admitting: Bariatrics

## 2019-07-03 ENCOUNTER — Other Ambulatory Visit: Payer: Self-pay

## 2019-07-03 VITALS — BP 112/70 | HR 85 | Temp 98.6°F | Ht 64.0 in | Wt 268.0 lb

## 2019-07-03 DIAGNOSIS — Z9189 Other specified personal risk factors, not elsewhere classified: Secondary | ICD-10-CM

## 2019-07-03 DIAGNOSIS — Z6841 Body Mass Index (BMI) 40.0 and over, adult: Secondary | ICD-10-CM

## 2019-07-03 DIAGNOSIS — E559 Vitamin D deficiency, unspecified: Secondary | ICD-10-CM | POA: Diagnosis not present

## 2019-07-03 DIAGNOSIS — E119 Type 2 diabetes mellitus without complications: Secondary | ICD-10-CM

## 2019-07-03 DIAGNOSIS — K5909 Other constipation: Secondary | ICD-10-CM

## 2019-07-03 MED ORDER — DOCUSATE SODIUM 100 MG PO CAPS: 100.0000 mg | ORAL_CAPSULE | Freq: Two times a day (BID) | ORAL | 0 refills | Status: DC

## 2019-07-08 ENCOUNTER — Encounter (INDEPENDENT_AMBULATORY_CARE_PROVIDER_SITE_OTHER): Payer: Self-pay | Admitting: Bariatrics

## 2019-07-08 NOTE — Progress Notes (Signed)
Office: [phone]  /  Fax: [phone]   HPI:   Chief Complaint: OBESITY Lisa Curry is here to discuss her progress with her obesity treatment plan. She is keeping a food journal with 1500 calories and 90 grams of protein and is following her eating plan approximately 10% of the time. She states she is exercising 0 minutes 0 times per week. Lisa Curry is up 3 lbs since her last visit. Her weight is 268 lb (121.6 kg) today and has had a weight gain of 3 lbs since her last visit. She has lost 11 lbs since starting treatment with [place].  Diabetes II without Complications Lisa Curry has a diagnosis of diabetes type II without complications. Lisa Curry is taking Januvia and Victoza and states fasting blood sugars range between 90 and 130. Last A1c was 5.9 on 04/22/2019 with an insulin of 9.8 on 11/28/2018. She has been working on intensive lifestyle modifications including diet, exercise, and weight loss to help control her blood glucose levels.  Constipation Lisa Curry notes constipation and is taking Linzess.   Vitamin D deficiency Lisa Curry has a diagnosis of Vitamin D deficiency. Last Vitamin D 43.4 on 04/22/2019. She is currently taking Vit D and denies nausea, vomiting or muscle weakness.  At risk for osteopenia and osteoporosis Lisa Curry is at higher risk of osteopenia and osteoporosis due to Vitamin D deficiency.   ASSESSMENT AND PLAN:  Vitamin D deficiency  Type 2 diabetes mellitus without complication, without long-term current use of insulin (HCC)  Other constipation - Plan: docusate sodium (COLACE) 100 MG capsule  At risk for osteoporosis  Class 3 severe obesity with serious comorbidity and body mass index (BMI) of 45.0 to 49.9 in adult, unspecified obesity type ([place])  PLAN:  Diabetes II without Complications Lisa Curry has been given extensive diabetes education by myself today including ideal fasting and post-prandial blood glucose readings, individual ideal HgA1c goals  and hypoglycemia prevention. We  discussed the importance of good blood sugar control to decrease the likelihood of diabetic complications such as nephropathy, neuropathy, limb loss, blindness, coronary artery disease, and death. We discussed the importance of intensive lifestyle modification including diet, exercise and weight loss as the first line treatment for diabetes. Trianna agrees to continue her diabetes medications, decrease carbohydrates, increase protein, and will follow-up at the agreed upon time.  Constipation Lisa Curry was informed decrease bowel movement frequency is normal while losing weight, but stools should not be hard or painful. She was advised to increase her H20 intake and work on increasing her fiber intake. High fiber foods were discussed today. Lisa Curry was given a prescription for Colace 100 mg 1 capsule 2 times daily #180 with 0 refills and agrees to follow-up with our clinic in 2 weeks. She will resume Linzess.  Vitamin D Deficiency Lisa Curry was informed that low Vitamin D levels contributes to fatigue and are associated with obesity, breast, and colon cancer. She agrees to continue taking Vit D and will follow-up for routine testing of Vitamin D, at least 2-3 times per year. She was informed of the risk of over-replacement of Vitamin D and agrees to not increase her dose unless she discusses this with [place] first. Lisa Curry agrees to follow-up with our clinic in 2 weeks.  At risk for osteopenia and osteoporosis Lisa Curry was given extended  (15 minutes) osteoporosis prevention counseling today. Lisa Curry is at risk for osteopenia and osteoporosis due to her Vitamin D deficiency. She was encouraged to take her Vitamin D and follow her higher calcium diet and increase strengthening  exercise to help strengthen her bones and decrease her risk of osteopenia and osteoporosis.  Obesity Lisa Curry is currently in the action stage of change. As such, her goal is to continue with weight loss efforts. Lisa Curry will work on meal planning, intentional  eating, and will focus more on journaling. She has agreed to keep a food journal with 1500 calories and 90 grams of protein. Lisa Curry has been instructed to work up to a goal of 150 minutes of combined cardio and strengthening exercise per week for weight loss and overall health benefits. We discussed the following Behavioral Modification Strategies today: increasing lean protein intake, decreasing simple carbohydrates, increasing vegetables, increase H20 intake, decrease eating out, no skipping meals, work on meal planning and easy cooking plans, keeping healthy foods in the home, and planning for success.  Lisa Curry has agreed to follow-up with our clinic in 2 weeks. She was informed of the importance of frequent follow-up visits to maximize her success with intensive lifestyle modifications for her multiple health conditions.  ALLERGIES: Allergies  Allergen Reactions  . Biaxin [Clarithromycin] Hives  . Metformin And Related Diarrhea    MEDICATIONS: Current Outpatient Medications on File Prior to Visit  Medication Sig Dispense Refill  . acetaminophen-codeine (TYLENOL #3) 300-30 MG tablet Take 1 tablet by mouth every 8 (eight) hours as needed for moderate pain. 15 tablet 0  . Aspirin-Acetaminophen-Caffeine (EXCEDRIN PO) Take 1 capsule by mouth daily. As needed    . atorvastatin (LIPITOR) 20 MG tablet TAKE 1 TABLET (20 MG TOTAL) BY MOUTH DAILY AT 6 PM. 90 tablet 0  . BD PEN NEEDLE MICRO U/F 32G X 6 MM MISC USE ONCE DAILY FOR VICTOZA 100 each 0  . buPROPion (WELLBUTRIN SR) 150 MG 12 hr tablet TAKE 1 TABLET BY MOUTH TWICE A DAY 180 tablet 0  . Cholecalciferol (VITAMIN D3) 125 MCG (5000 UT) TABS Take 1 tablet by mouth daily.    . clonazePAM (KLONOPIN) 0.5 MG tablet TAKE 1 TABLET BY MOUTH TWICE A DAY AS NEEDED FOR ANXIETY 60 tablet 0  . cyclobenzaprine (FLEXERIL) 10 MG tablet Take 1 tablet (10 mg total) by mouth 2 (two) times daily as needed for muscle spasms (sedation precautions). 30 tablet 1  .  dexlansoprazole (DEXILANT) 60 MG capsule TAKE 1 CAPSULE BY MOUTH EVERY DAY 90 capsule 1  . dextromethorphan-guaiFENesin (MUCINEX DM) 30-600 MG 12hr tablet Take 1 tablet by mouth as needed for cough.    . fluticasone (FLONASE) 50 MCG/ACT nasal spray Place into both nostrils daily.    Marland Kitchen glucose blood (ONE TOUCH ULTRA TEST) test strip Use to check sugar twice daily and as needed. Dx: E11.65 300 each 3  . ibuprofen (ADVIL,MOTRIN) 600 MG tablet Take 600 mg by mouth daily.    Elmore Guise Devices (ONE TOUCH DELICA LANCING DEV) MISC Use as directed with lancets and one touch meter E11.65 1 each 0  . linaclotide (LINZESS) 145 MCG CAPS capsule Take 1 capsule (145 mcg total) by mouth daily before breakfast. 30 capsule 0  . liraglutide (VICTOZA) 18 MG/3ML SOPN Inject 0.1 mLs (0.6 mg total) into the skin daily. 3 pen 0  . magnesium oxide (MAG-OX) 400 MG tablet Take 400 mg by mouth daily.    . meclizine (ANTIVERT) 12.5 MG tablet Take 12.5 mg by mouth 3 (three) times daily as needed for dizziness.    . Multiple Vitamin (MULTIVITAMIN) capsule Take 1 capsule by mouth daily.    Glory Rosebush DELICA LANCETS [id number] MISC Check blood sugar twice  daily and as directed. Dx E11.65 200 each 0  . pioglitazone (ACTOS) 30 MG tablet TAKE 1 TABLET BY MOUTH EVERY DAY 90 tablet 1  . polyethylene glycol powder (GLYCOLAX/MIRALAX) 17 GM/SCOOP powder Take 17 g by mouth daily. 3350 g 0  . POTASSIUM PO Take by mouth daily.    . ranitidine (ZANTAC) 150 MG tablet Take 150 mg by mouth at bedtime.    . sitaGLIPtin (JANUVIA) 50 MG tablet Take 1 tablet (50 mg total) by mouth daily. 30 tablet 3  . TRADJENTA 5 MG TABS tablet TAKE 1 TABLET BY MOUTH EVERY DAY 30 tablet 5  . Venlafaxine HCl 225 MG TB24 TAKE 1 TABLET (225 MG TOTAL) BY MOUTH DAILY. 90 tablet 1  . Vitamin D, Ergocalciferol, (DRISDOL) 1.25 MG (50000 UT) CAPS capsule Take 1 capsule (50,000 Units total) by mouth every 7 (seven) days. 4 capsule 0   Current Facility-Administered Medications on  File Prior to Visit  Medication Dose Route Frequency Provider Last Rate Last Dose  . diclofenac sodium (VOLTAREN) 1 % transdermal gel 2 g  2 g Topical QID Jessy Oto, MD        PAST MEDICAL HISTORY: Past Medical History:  Diagnosis Date  . Allergy   . Anemia   . Anxiety   . Barrett esophagus    Don Bulla, have not received EGD report  . Chronic back pain   . Constipation   . Depression with anxiety   . Diabetes mellitus without complication ([place])    DSME 04/2014  . Edema, lower extremity   . Fatty liver   . Gastric polyp 05/2012   h/o EGD  . GERD (gastroesophageal reflux disease)   . History of Helicobacter pylori infection   . HLD (hyperlipidemia)   . Hyperlipidemia   . Vitamin D deficiency     PAST SURGICAL HISTORY: Past Surgical History:  Procedure Laterality Date  . CESAREAN SECTION  6/95, 4/98  . DILATION AND CURETTAGE OF UTERUS  2011  . ESOPHAGOGASTRODUODENOSCOPY  2012?   [facility] L[facility] at [facility]  . ESOPHAGOGASTRODUODENOSCOPY  10/2017   [facility] Ardis Hughs)  . LUMBAR DISC SURGERY  03/1995  . NASAL SINUS SURGERY  03/2001    SOCIAL HISTORY: Social History   Tobacco Use  . Smoking status: Never Smoker  . Smokeless tobacco: Never Used  Substance Use Topics  . Alcohol use: Yes    Alcohol/week: 0.0 standard drinks    Comment: occasional  . Drug use: No    FAMILY HISTORY: Family History  Problem Relation Age of Onset  . Breast cancer Maternal Grandmother 36  . Dementia Maternal Grandmother   . Heart disease Paternal Grandfather   . Other Sister        Postural Orthostatic Tachycardia Syndrome  . Supraventricular tachycardia Sister   . Other Maternal Aunt        hysterectomy for irregular bleed with abnormal pap  . Hyperlipidemia Mother   . Alzheimer's disease Maternal Grandfather    ROS: Review of Systems  Gastrointestinal: Positive for constipation. Negative for nausea and vomiting.  Musculoskeletal:       Negative for muscle weakness.    PHYSICAL EXAM: Blood pressure 112/70, pulse 85, temperature 98.6 F (37 C), temperature source Oral, height 5\' 4"  (1.626 m), weight 268 lb (121.6 kg), SpO2 100 %. Body mass index is 46 kg/m. Physical Exam Vitals signs reviewed.  Constitutional:      Appearance: Normal appearance. She is obese.  Cardiovascular:  Rate and Rhythm: Normal rate.     Pulses: Normal pulses.  Pulmonary:     Effort: Pulmonary effort is normal.     Breath sounds: Normal breath sounds.  Musculoskeletal: Normal range of motion.  Skin:    General: Skin is warm and dry.  Neurological:     Mental Status: She is alert and oriented to person, place, and time.  Psychiatric:        Behavior: Behavior normal.   RECENT LABS AND TESTS: BMET    Component Value Date/Time   NA 140 04/22/2019 1128   K 4.4 04/22/2019 1128   CL 99 04/22/2019 1128   CO2 25 04/22/2019 1128   GLUCOSE 89 04/22/2019 1128   GLUCOSE 89 05/16/2018 1747   BUN 11 04/22/2019 1128   CREATININE 0.75 04/22/2019 1128   CREATININE 0.68 03/28/2014 1632   CALCIUM 9.0 04/22/2019 1128   GFRNONAA 94 04/22/2019 1128   GFRAA 108 04/22/2019 1128   Lab Results  Component Value Date   HGBA1C 5.9 (H) 04/22/2019   HGBA1C 5.9 (H) 11/28/2018   HGBA1C 5.6 08/24/2018   HGBA1C 6.4 05/16/2018   HGBA1C 6.3 12/11/2017   Lab Results  Component Value Date   INSULIN 9.8 11/28/2018   INSULIN 20.0 07/26/2018   CBC    Component Value Date/Time   WBC 8.9 09/01/2016 1142   RBC 4.39 09/01/2016 1142   HGB 13.2 09/01/2016 1142   HCT 39.3 09/01/2016 1142   PLT 313.0 09/01/2016 1142   MCV 89.6 09/01/2016 1142   MCH 28.0 06/21/2014 0309   MCHC 33.7 09/01/2016 1142   RDW 15.2 09/01/2016 1142   LYMPHSABS 2.4 09/01/2016 1142   MONOABS 0.2 09/01/2016 1142   EOSABS 0.2 09/01/2016 1142   BASOSABS 0.0 09/01/2016 1142   Iron/TIBC/Ferritin/ %Sat    Component Value Date/Time   IRON 62 05/30/2016 1307   FERRITIN 55.2 05/30/2016 1307   IRONPCTSAT 16.4 (L)  05/30/2016 1307   Lipid Panel     Component Value Date/Time   CHOL 133 11/28/2018 1550   CHOL 123 10/30/2012   TRIG 136 11/28/2018 1550   TRIG 124 10/30/2012   HDL 46 11/28/2018 1550   CHOLHDL 3 05/16/2018 1747   VLDL 24.8 05/16/2018 1747   LDLCALC 60 11/28/2018 1550   LDLCALC 59 10/30/2012   LDLDIRECT 73.0 04/18/2017 0847   Hepatic Function Panel     Component Value Date/Time   PROT 6.4 04/22/2019 1128   ALBUMIN 4.3 04/22/2019 1128   AST 26 04/22/2019 1128   ALT 23 04/22/2019 1128   ALKPHOS 97 04/22/2019 1128   BILITOT 0.4 04/22/2019 1128      Component Value Date/Time   TSH 2.42 05/16/2018 1747   TSH 1.80 04/07/2016 1632   TSH 2.351 03/28/2014 1632   Results for MADA, LAZARIN (MRN [id number]) as of 07/08/2019 11:33  Ref. Range 04/22/2019 11:28  Vitamin D, 25-Hydroxy Latest Ref Range: 30.0 - 100.0 ng/mL 43.4   OBESITY BEHAVIORAL INTERVENTION VISIT  Today's visit was #16  Starting weight: 279 lbs Starting date: 07/26/2018 Today's weight: 268 lbs  Today's date: 07/03/2019 Total lbs lost to date: 11    07/03/2019  Height 5\' 4"  (1.626 m)  Weight 268 lb (121.6 kg)  BMI (Calculated) 45.98  BLOOD PRESSURE - SYSTOLIC [id number]  BLOOD PRESSURE - DIASTOLIC 70   Body Fat % [id number] %   ASK: We discussed the diagnosis of obesity with [name] today and [name] agreed to give [place] permission to  discuss obesity behavioral modification therapy today.  ASSESS: Hyun has the diagnosis of obesity and her BMI today is 46.1. [place] is in the action stage of change.   ADVISE: Shanice was educated on the multiple health risks of obesity as well as the benefit of weight loss to improve her health. She was advised of the need for long term treatment and the importance of lifestyle modifications to improve her current health and to decrease her risk of future health problems.  AGREE: Multiple dietary modification options and treatment options were discussed and  Laniqua agreed to follow the  recommendations documented in the above note.  ARRANGE: Dalinda was educated on the importance of frequent visits to treat obesity as outlined per CMS and USPSTF guidelines and agreed to schedule her next follow up appointment today.  Migdalia Dk, am acting as Location manager for [employer], DO  I have reviewed the above documentation for accuracy and completeness, and I agree with the above. -Jearld Lesch, DO

## 2019-07-18 NOTE — Telephone Encounter (Signed)
Called pt to schedule cpe/labs pt was asking why does she need a cpe if she just had her pap last year? I tried to explain that having a pap with physical is different than just having a regular physical. She had a lot of questions that I could not answer. She said she was expecting a call back from [place] but has not received it yet. I went ahead and scheduled her for 09/11/19 labs and 09/18/19 cpe. Please advise.

## 2019-07-18 NOTE — Telephone Encounter (Addendum)
Left message on vm per dpr explaining a Pap is done every 3 yrs, if normal.  However, a cpe is done annually.  I see labs pt had done with wt mngmt in 04/2019.  I'll fwd message to Dr. Darnell Level to see if those labs can count for cpe scheduled on 09/18/19 or should pt keep current cpe lab visit scheduled on 09/11/19.  Informed pt Dr. Darnell Level is out of the office and will not return until 07/23/19.  Pt returning call stating she received my message and verbalizes understanding.  Wants to add she will be having labs again with wt mngmt between now and the 12/30 cpe.

## 2019-07-21 NOTE — Telephone Encounter (Signed)
Ok to only do CPE office visit, not lab visit prior. Will see at that time if we need to add med. Would have her come fasting if able to office visit in case we need labs (would want to check cholesterol levels again, but they may be checked by [facility])

## 2019-07-22 ENCOUNTER — Other Ambulatory Visit: Payer: Self-pay | Admitting: Family Medicine

## 2019-07-22 NOTE — Telephone Encounter (Signed)
Last office visit 01/10/2019 for back pain.  Last refilled 05/24/2019 for #60 with no refills.  CPE scheduled for 09/18/2019.

## 2019-07-23 ENCOUNTER — Other Ambulatory Visit (INDEPENDENT_AMBULATORY_CARE_PROVIDER_SITE_OTHER): Payer: Self-pay | Admitting: Physician Assistant

## 2019-07-23 DIAGNOSIS — K5909 Other constipation: Secondary | ICD-10-CM

## 2019-07-24 ENCOUNTER — Ambulatory Visit (INDEPENDENT_AMBULATORY_CARE_PROVIDER_SITE_OTHER): Payer: BC Managed Care – PPO | Admitting: Physician Assistant

## 2019-07-24 DIAGNOSIS — M961 Postlaminectomy syndrome, not elsewhere classified: Secondary | ICD-10-CM | POA: Diagnosis not present

## 2019-07-24 DIAGNOSIS — M62830 Muscle spasm of back: Secondary | ICD-10-CM | POA: Diagnosis not present

## 2019-07-24 NOTE — Telephone Encounter (Signed)
ERx 

## 2019-07-25 ENCOUNTER — Encounter (INDEPENDENT_AMBULATORY_CARE_PROVIDER_SITE_OTHER): Payer: Self-pay | Admitting: Physician Assistant

## 2019-07-25 NOTE — Telephone Encounter (Signed)
Pt notified as instructed.  Verbalizes understanding.  Lab visit c/x.   Pt mentions she is disappointed the prednisone request was never replied to [facility] Refill encounter, 06/28/19] .  Says she just wanted to make the office aware.  She has since been seen by ortho.  Fyi to Dr. Darnell Level

## 2019-07-25 NOTE — Telephone Encounter (Signed)
Please review

## 2019-07-27 ENCOUNTER — Other Ambulatory Visit: Payer: Self-pay | Admitting: Family Medicine

## 2019-07-27 NOTE — Telephone Encounter (Signed)
Noted. My understanding from prior note was that she would call us to schedule f/u visit once she had met deductible.

## 2019-08-01 ENCOUNTER — Ambulatory Visit (INDEPENDENT_AMBULATORY_CARE_PROVIDER_SITE_OTHER): Payer: BC Managed Care – PPO | Admitting: Family Medicine

## 2019-08-01 ENCOUNTER — Other Ambulatory Visit: Payer: Self-pay

## 2019-08-01 VITALS — BP 169/74 | HR 104 | Temp 97.7°F | Ht 64.0 in | Wt 269.0 lb

## 2019-08-01 DIAGNOSIS — Z9189 Other specified personal risk factors, not elsewhere classified: Secondary | ICD-10-CM

## 2019-08-01 DIAGNOSIS — E559 Vitamin D deficiency, unspecified: Secondary | ICD-10-CM | POA: Diagnosis not present

## 2019-08-01 DIAGNOSIS — E119 Type 2 diabetes mellitus without complications: Secondary | ICD-10-CM | POA: Diagnosis not present

## 2019-08-01 DIAGNOSIS — F4321 Adjustment disorder with depressed mood: Secondary | ICD-10-CM | POA: Diagnosis not present

## 2019-08-01 DIAGNOSIS — Z6841 Body Mass Index (BMI) 40.0 and over, adult: Secondary | ICD-10-CM

## 2019-08-01 DIAGNOSIS — M549 Dorsalgia, unspecified: Secondary | ICD-10-CM

## 2019-08-02 LAB — CBC WITH DIFFERENTIAL/PLATELET
Basophils Absolute: 0.1 10*3/uL (ref 0.0–0.2)
Basos: 0 %
EOS (ABSOLUTE): 0.3 10*3/uL (ref 0.0–0.4)
Eos: 2 %
Hematocrit: 42 % (ref 34.0–46.6)
Hemoglobin: 14.2 g/dL (ref 11.1–15.9)
Immature Grans (Abs): 0.2 10*3/uL — ABNORMAL HIGH (ref 0.0–0.1)
Immature Granulocytes: 2 %
Lymphocytes Absolute: 3.2 10*3/uL — ABNORMAL HIGH (ref 0.7–3.1)
Lymphs: 22 %
MCH: 29.8 pg (ref 26.6–33.0)
MCHC: 33.8 g/dL (ref 31.5–35.7)
MCV: 88 fL (ref 79–97)
Monocytes Absolute: 0.6 10*3/uL (ref 0.1–0.9)
Monocytes: 4 %
Neutrophils Absolute: 10.4 10*3/uL — ABNORMAL HIGH (ref 1.4–7.0)
Neutrophils: 70 %
Platelets: 357 10*3/uL (ref 150–450)
RBC: 4.77 x10E6/uL (ref 3.77–5.28)
RDW: 13 % (ref 11.7–15.4)
WBC: 14.6 10*3/uL — ABNORMAL HIGH (ref 3.4–10.8)

## 2019-08-02 LAB — COMPREHENSIVE METABOLIC PANEL
ALT: 19 IU/L (ref 0–32)
AST: 17 IU/L (ref 0–40)
Albumin/Globulin Ratio: 2 (ref 1.2–2.2)
Albumin: 4.4 g/dL (ref 3.8–4.8)
Alkaline Phosphatase: 111 IU/L (ref 39–117)
BUN/Creatinine Ratio: 17 (ref 9–23)
BUN: 15 mg/dL (ref 6–24)
Bilirubin Total: 0.3 mg/dL (ref 0.0–1.2)
CO2: 26 mmol/L (ref 20–29)
Calcium: 9.2 mg/dL (ref 8.7–10.2)
Chloride: 98 mmol/L (ref 96–106)
Creatinine, Ser: 0.86 mg/dL (ref 0.57–1.00)
GFR calc Af Amer: 92 mL/min/{1.73_m2} (ref >59)
GFR calc non Af Amer: 80 mL/min/{1.73_m2} (ref >59)
Globulin, Total: 2.2 g/dL (ref 1.5–4.5)
Glucose: 94 mg/dL (ref 65–99)
Potassium: 4.5 mmol/L (ref 3.5–5.2)
Sodium: 140 mmol/L (ref 134–144)
Total Protein: 6.6 g/dL (ref 6.0–8.5)

## 2019-08-02 LAB — VITAMIN D 25 HYDROXY (VIT D DEFICIENCY, FRACTURES): Vit D, 25-Hydroxy: 53.8 ng/mL (ref 30.0–100.0)

## 2019-08-02 LAB — HEMOGLOBIN A1C
Est. average glucose Bld gHb Est-mCnc: 131 mg/dL
Hgb A1c MFr Bld: 6.2 % — ABNORMAL HIGH (ref 4.8–5.6)

## 2019-08-03 ENCOUNTER — Other Ambulatory Visit (INDEPENDENT_AMBULATORY_CARE_PROVIDER_SITE_OTHER): Payer: Self-pay | Admitting: Bariatrics

## 2019-08-03 ENCOUNTER — Other Ambulatory Visit (INDEPENDENT_AMBULATORY_CARE_PROVIDER_SITE_OTHER): Payer: Self-pay | Admitting: Physician Assistant

## 2019-08-03 DIAGNOSIS — E119 Type 2 diabetes mellitus without complications: Secondary | ICD-10-CM

## 2019-08-04 ENCOUNTER — Encounter (INDEPENDENT_AMBULATORY_CARE_PROVIDER_SITE_OTHER): Payer: Self-pay | Admitting: Family Medicine

## 2019-08-05 ENCOUNTER — Other Ambulatory Visit (INDEPENDENT_AMBULATORY_CARE_PROVIDER_SITE_OTHER): Payer: Self-pay

## 2019-08-05 MED ORDER — BD PEN NEEDLE MICRO U/F 32G X 6 MM MISC: 1.0000 | Freq: Once | 0 refills | Status: AC

## 2019-08-05 MED ORDER — VICTOZA 18 MG/3ML ~~LOC~~ SOPN: 0.6000 mg | PEN_INJECTOR | Freq: Every day | SUBCUTANEOUS | 0 refills | Status: DC

## 2019-08-05 NOTE — Telephone Encounter (Signed)
Please advise 

## 2019-08-06 ENCOUNTER — Encounter (INDEPENDENT_AMBULATORY_CARE_PROVIDER_SITE_OTHER): Payer: Self-pay | Admitting: Family Medicine

## 2019-08-06 NOTE — Progress Notes (Signed)
Office: [phone]  /  Fax: [phone]   HPI:   Chief Complaint: OBESITY Lisa Curry is here to discuss her progress with her obesity treatment plan. She is on the keep a food journal with 1500 calories and 90 grams of protein daily and is following her eating plan approximately 0 % of the time. 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 Also with acute back pain and she is just finishing prednisone pack.  Her weight is 269 lb (122 kg) today and has gained 1 lb since her last visit. She has lost 10 lbs since starting treatment with [place].  Vitamin D Deficiency Lisa Curry has a diagnosis of vitamin D deficiency. She is currently taking prescription Vit D and denies nausea, vomiting or muscle weakness.  At risk for osteopenia and osteoporosis Lisa Curry is at higher risk of osteopenia and osteoporosis due to vitamin D deficiency.   Diabetes II Lisa Curry has a diagnosis of diabetes type II. Lisa Curry is on medications and denies hypoglycemia. Last A1c was 5.9. She has been working on intensive lifestyle modifications including diet, exercise, and weight loss to help control her blood glucose levels.  Situational Depression with Emotional Eating Behaviors Lisa Curry states she is overwhelmed with taking care of her grandmother. Lisa Curry struggles with emotional eating and using food for comfort to the extent that it is negatively impacting her health. She often snacks when she is not hungry. Lisa Curry sometimes feels she is out of control and then feels guilty that she made poor food choices. She has been working on behavior modification techniques to help reduce her emotional eating and has been somewhat successful. She shows no sign of suicidal or homicidal ideations.  Back Pain Lisa Curry complains of back pain, but she notes it is improving.  ASSESSMENT AND PLAN:  Vitamin D deficiency - Plan: CBC with  Differential/Platelet, Vitamin D (25 hydroxy)  Type 2 diabetes mellitus without complication, without long-term current use of insulin (HCC) - Plan: Comprehensive metabolic panel, CBC with Differential/Platelet, Hemoglobin A1c, HgB A1c  Situational depression - with emotional eating   Back pain, unspecified back location, unspecified back pain laterality, unspecified chronicity  At risk for osteoporosis  Class 3 severe obesity with serious comorbidity and body mass index (BMI) of 45.0 to 49.9 in adult, unspecified obesity type ([place])  PLAN:  Vitamin D Deficiency Lisa Curry was informed that low vitamin D levels contributes to fatigue and are associated with obesity, breast, and colon cancer. Lisa Curry agrees to continue taking prescription Vit D 50,000 IU every week and will follow up for routine testing of vitamin D, at least 2-3 times per year. She was informed of the risk of over-replacement of vitamin D and agrees to not increase her dose unless she discusses this with [place] first. We will check Vit D level today. Lisa Curry agrees to follow up with our clinic in 2 to 3 weeks.  At risk for osteopenia and osteoporosis Lisa Curry was given extended (15 minutes) osteoporosis prevention counseling today. Lisa Curry is at risk for osteopenia and osteoporsis due to her vitamin D deficiency. She was encouraged to take her vitamin D and follow her higher calcium diet and increase strengthening exercise to help strengthen her bones and decrease her risk of osteopenia and osteoporosis.  Diabetes II Lisa Curry has been given extensive diabetes education by myself today including ideal fasting and post-prandial blood glucose readings, individual ideal Hgb A1c goals  and hypoglycemia prevention. We discussed the importance of good blood sugar control to decrease the likelihood of diabetic complications such as nephropathy, neuropathy, limb loss, blindness, coronary artery disease, and death. We discussed the importance of intensive lifestyle  modification including diet, exercise and weight loss as the first line treatment for diabetes. Lisa Curry agrees to continue her diabetes medications, and we will check A1c today. Lisa Curry agrees to follow up with our clinic in 2 to 3 weeks.  Depression with Emotional Eating Behaviors We discussed behavior modification techniques today to help Lisa Curry deal with her emotional eating and depression. Lisa Curry has caregiver fatigue, and we reviewed boundaries and self-care.  Back Pain We will continue to monitor, and Lisa Curry agrees to follow up with our clinic in 2 to 3 weeks.  Obesity Lisa Curry is currently in the action stage of change. As such, her goal is to continue with weight loss efforts She has agreed to keep a food journal with 1500 calories and 90 grams of protein daily Lisa Curry has been instructed to work up to a goal of 150 minutes of combined cardio and strengthening exercise per week or as tolerated for weight loss and overall health benefits. We discussed the following Behavioral Modification Strategies today: increase H20 intake, dealing with family or coworker sabotage, emotional eating strategies, and planning for success  Lisa Curry has agreed to follow up with our clinic in 2 to 3 weeks. She was informed of the importance of frequent follow up visits to maximize her success with intensive lifestyle modifications for her multiple health conditions.  ALLERGIES: Allergies  Allergen Reactions  . Biaxin [Clarithromycin] Hives  . Metformin And Related Diarrhea    MEDICATIONS: Current Outpatient Medications on File Prior to Visit  Medication Sig Dispense Refill  . acetaminophen-codeine (TYLENOL #3) 300-30 MG tablet Take 1 tablet by mouth every 8 (eight) hours as needed for moderate pain. 15 tablet 0  . Aspirin-Acetaminophen-Caffeine (EXCEDRIN PO) Take 1 capsule by mouth daily. As needed    . atorvastatin (LIPITOR) 20 MG tablet TAKE 1 TABLET (20 MG TOTAL) BY MOUTH DAILY AT 6 PM. 90 tablet 0  . buPROPion  (WELLBUTRIN SR) 150 MG 12 hr tablet TAKE 1 TABLET BY MOUTH TWICE A DAY 180 tablet 0  . Cholecalciferol (VITAMIN D3) 125 MCG (5000 UT) TABS Take 1 tablet by mouth daily.    . clonazePAM (KLONOPIN) 0.5 MG tablet TAKE 1 TABLET BY MOUTH TWICE A DAY AS NEEDED FOR ANXIETY 60 tablet 0  . cyclobenzaprine (FLEXERIL) 10 MG tablet Take 1 tablet (10 mg total) by mouth 2 (two) times daily as needed for muscle spasms (sedation precautions). 30 tablet 1  . dexlansoprazole (DEXILANT) 60 MG capsule TAKE 1 CAPSULE BY MOUTH EVERY DAY 90 capsule 1  . dextromethorphan-guaiFENesin (MUCINEX DM) 30-600 MG 12hr tablet Take 1 tablet by mouth as needed for cough.    . docusate sodium (COLACE) 100 MG capsule Take 1 capsule (100 mg total) by mouth 2 (two) times daily. 180 capsule 0  . fluticasone (FLONASE) 50 MCG/ACT nasal spray Place into both nostrils daily.    Lisa Curry Kitchen glucose blood (ONE TOUCH ULTRA TEST) test strip Use to check sugar twice daily and as needed. Dx: E11.65 300 each 3  . ibuprofen (ADVIL,MOTRIN) 600 MG tablet Take 600 mg by mouth daily.    Elmore Guise Devices (ONE TOUCH DELICA LANCING DEV) MISC Use as directed with lancets and one touch meter E11.65 1 each 0  . LINZESS 145 MCG CAPS capsule TAKE 1 CAPSULE (  145 MCG TOTAL) BY MOUTH DAILY BEFORE BREAKFAST. 30 capsule 0  . magnesium oxide (MAG-OX) 400 MG tablet Take 400 mg by mouth daily.    . meclizine (ANTIVERT) 12.5 MG tablet Take 12.5 mg by mouth 3 (three) times daily as needed for dizziness.    . Multiple Vitamin (MULTIVITAMIN) capsule Take 1 capsule by mouth daily.    Glory Rosebush DELICA LANCETS 99991111 MISC Check blood sugar twice daily and as directed. Dx E11.65 200 each 0  . pioglitazone (ACTOS) 30 MG tablet TAKE 1 TABLET BY MOUTH EVERY DAY 90 tablet 1  . polyethylene glycol powder (GLYCOLAX/MIRALAX) 17 GM/SCOOP powder Take 17 g by mouth daily. 3350 g 0  . POTASSIUM PO Take by mouth daily.    . ranitidine (ZANTAC) 150 MG tablet Take 150 mg by mouth at bedtime.    .  sitaGLIPtin (JANUVIA) 50 MG tablet Take 1 tablet (50 mg total) by mouth daily. 30 tablet 3  . TRADJENTA 5 MG TABS tablet TAKE 1 TABLET BY MOUTH EVERY DAY 30 tablet 5  . Venlafaxine HCl 225 MG TB24 TAKE 1 TABLET (225 MG TOTAL) BY MOUTH DAILY. 90 tablet 1  . Vitamin D, Ergocalciferol, (DRISDOL) 1.25 MG (50000 UT) CAPS capsule Take 1 capsule (50,000 Units total) by mouth every 7 (seven) days. 4 capsule 0   Current Facility-Administered Medications on File Prior to Visit  Medication Dose Route Frequency Provider Last Rate Last Dose  . diclofenac sodium (VOLTAREN) 1 % transdermal gel 2 g  2 g Topical QID Jessy Oto, MD        PAST MEDICAL HISTORY: Past Medical History:  Diagnosis Date  . Allergy   . Anemia   . Anxiety   . Barrett esophagus    Don Bulla, have not received EGD report  . Chronic back pain   . Constipation   . Depression with anxiety   . Diabetes mellitus without complication ([place])    DSME 04/2014  . Edema, lower extremity   . Fatty liver   . Gastric polyp 05/2012   h/o EGD  . GERD (gastroesophageal reflux disease)   . History of Helicobacter pylori infection   . HLD (hyperlipidemia)   . Hyperlipidemia   . Vitamin D deficiency     PAST SURGICAL HISTORY: Past Surgical History:  Procedure Laterality Date  . CESAREAN SECTION  6/95, 4/98  . DILATION AND CURETTAGE OF UTERUS  2011  . ESOPHAGOGASTRODUODENOSCOPY  2012?   [facility] at [facility]  . ESOPHAGOGASTRODUODENOSCOPY  10/2017   WNL Ardis Hughs)  . LUMBAR DISC SURGERY  03/1995  . NASAL SINUS SURGERY  03/2001    SOCIAL HISTORY: Social History   Tobacco Use  . Smoking status: Never Smoker  . Smokeless tobacco: Never Used  Substance Use Topics  . Alcohol use: Yes    Alcohol/week: 0.0 standard drinks    Comment: occasional  . Drug use: No    FAMILY HISTORY: Family History  Problem Relation Age of Onset  . Breast cancer Maternal Grandmother 25  . Dementia Maternal Grandmother   . Heart disease  Paternal Grandfather   . Other Sister        Postural Orthostatic Tachycardia Syndrome  . Supraventricular tachycardia Sister   . Other Maternal Aunt        hysterectomy for irregular bleed with abnormal pap  . Hyperlipidemia Mother   . Alzheimer's disease Maternal Grandfather     ROS: Review of Systems  Constitutional: Negative for weight loss.  Gastrointestinal: Negative for nausea and vomiting.  Musculoskeletal: Positive for back pain.       Negative muscle weakness  Endo/Heme/Allergies:       Negative hypoglycemia  Psychiatric/Behavioral: Positive for depression. Negative for suicidal ideas.    PHYSICAL EXAM: Blood pressure (!) 169/74, pulse (!) 104, temperature 97.7 F (36.5 C), temperature source Oral, height 5\' 4"  (1.626 m), weight 269 lb (122 kg), SpO2 99 %. Body mass index is 46.17 kg/m. Physical Exam Vitals signs reviewed.  Constitutional:      Appearance: Normal appearance. She is obese.  HENT:     Head:     Comments: + Tearful Cardiovascular:     Rate and Rhythm: Normal rate.     Pulses: Normal pulses.  Pulmonary:     Effort: Pulmonary effort is normal.     Breath sounds: Normal breath sounds.  Musculoskeletal: Normal range of motion.  Skin:    General: Skin is warm and dry.  Neurological:     Mental Status: She is alert and oriented to person, place, and time.  Psychiatric:        Mood and Affect: Mood normal.        Behavior: Behavior normal.     RECENT LABS AND TESTS: BMET    Component Value Date/Time   NA 140 08/01/2019 1245   K 4.5 08/01/2019 1245   CL 98 08/01/2019 1245   CO2 26 08/01/2019 1245   GLUCOSE 94 08/01/2019 1245   GLUCOSE 89 05/16/2018 1747   BUN 15 08/01/2019 1245   CREATININE 0.86 08/01/2019 1245   CREATININE 0.68 03/28/2014 1632   CALCIUM 9.2 08/01/2019 1245   GFRNONAA 80 08/01/2019 1245   GFRAA 92 08/01/2019 1245   Lab Results  Component Value Date   HGBA1C 6.2 (H) 08/01/2019   HGBA1C 5.9 (H) 04/22/2019   HGBA1C  5.9 (H) 11/28/2018   HGBA1C 5.6 08/24/2018   HGBA1C 6.4 05/16/2018   Lab Results  Component Value Date   INSULIN 9.8 11/28/2018   INSULIN 20.0 07/26/2018   CBC    Component Value Date/Time   WBC 14.6 (H) 08/01/2019 1245   WBC 8.9 09/01/2016 1142   RBC 4.77 08/01/2019 1245   RBC 4.39 09/01/2016 1142   HGB 14.2 08/01/2019 1245   HCT 42.0 08/01/2019 1245   PLT 357 08/01/2019 1245   MCV 88 08/01/2019 1245   MCH 29.8 08/01/2019 1245   MCH 28.0 06/21/2014 0309   MCHC 33.8 08/01/2019 1245   MCHC 33.7 09/01/2016 1142   RDW 13.0 08/01/2019 1245   LYMPHSABS 3.2 (H) 08/01/2019 1245   MONOABS 0.2 09/01/2016 1142   EOSABS 0.3 08/01/2019 1245   BASOSABS 0.1 08/01/2019 1245   Iron/TIBC/Ferritin/ %Sat    Component Value Date/Time   IRON 62 05/30/2016 1307   FERRITIN 55.2 05/30/2016 1307   IRONPCTSAT 16.4 (L) 05/30/2016 1307   Lipid Panel     Component Value Date/Time   CHOL 133 11/28/2018 1550   CHOL 123 10/30/2012   TRIG 136 11/28/2018 1550   TRIG 124 10/30/2012   HDL 46 11/28/2018 1550   CHOLHDL 3 05/16/2018 1747   VLDL 24.8 05/16/2018 1747   LDLCALC 60 11/28/2018 1550   LDLCALC 59 10/30/2012   LDLDIRECT 73.0 04/18/2017 0847   Hepatic Function Panel     Component Value Date/Time   PROT 6.6 08/01/2019 1245   ALBUMIN 4.4 08/01/2019 1245   AST 17 08/01/2019 1245   ALT 19 08/01/2019 1245   ALKPHOS 111 08/01/2019  1245   BILITOT 0.3 08/01/2019 1245      Component Value Date/Time   TSH 2.42 05/16/2018 1747   TSH 1.80 04/07/2016 1632   TSH 2.351 03/28/2014 1632      OBESITY BEHAVIORAL INTERVENTION VISIT  Today's visit was # 30   Starting weight: 279 lbs Starting date: 07/26/18 Today's weight : 269 lbs Today's date: 08/01/2019 Total lbs lost to date: 10    ASK: We discussed the diagnosis of obesity with [name] today and [name] agreed to give [place] permission to discuss obesity behavioral modification therapy today.  ASSESS: Elah has the diagnosis of  obesity and her BMI today is 67.15 Mattilyn is in the action stage of change   ADVISE: Jashanti was educated on the multiple health risks of obesity as well as the benefit of weight loss to improve her health. She was advised of the need for long term treatment and the importance of lifestyle modifications to improve her current health and to decrease her risk of future health problems.  AGREE: Multiple dietary modification options and treatment options were discussed and  Tashiyah agreed to follow the recommendations documented in the above note.  ARRANGE: Amyre was educated on the importance of frequent visits to treat obesity as outlined per CMS and USPSTF guidelines and agreed to schedule her next follow up appointment today.  Wilhemena Durie, am acting as transcriptionist for Briscoe Deutscher, DO  I have reviewed the above documentation for accuracy and completeness, and I agree with the above. Briscoe Deutscher, DO

## 2019-08-07 ENCOUNTER — Encounter (INDEPENDENT_AMBULATORY_CARE_PROVIDER_SITE_OTHER): Payer: Self-pay | Admitting: Family Medicine

## 2019-08-07 ENCOUNTER — Telehealth (INDEPENDENT_AMBULATORY_CARE_PROVIDER_SITE_OTHER): Payer: Self-pay | Admitting: Family Medicine

## 2019-08-07 ENCOUNTER — Encounter: Payer: Self-pay | Admitting: Family Medicine

## 2019-08-07 NOTE — Telephone Encounter (Signed)
Responded to pt's my chart message. Lisa Curry

## 2019-08-08 ENCOUNTER — Telehealth: Payer: Self-pay

## 2019-08-08 ENCOUNTER — Other Ambulatory Visit: Payer: Self-pay

## 2019-08-08 ENCOUNTER — Ambulatory Visit (INDEPENDENT_AMBULATORY_CARE_PROVIDER_SITE_OTHER)
Admission: RE | Admit: 2019-08-08 | Discharge: 2019-08-08 | Disposition: A | Discharge status: Home / Self Care | Payer: BC Managed Care – PPO | Source: Ambulatory Visit | Attending: Primary Care | Admitting: Primary Care

## 2019-08-08 ENCOUNTER — Ambulatory Visit (INDEPENDENT_AMBULATORY_CARE_PROVIDER_SITE_OTHER): Payer: BC Managed Care – PPO | Admitting: Primary Care

## 2019-08-08 ENCOUNTER — Encounter: Payer: Self-pay | Admitting: Primary Care

## 2019-08-08 VITALS — BP 136/74 | HR 82 | Temp 97.6°F | Ht 64.0 in | Wt 274.2 lb

## 2019-08-08 DIAGNOSIS — R0602 Shortness of breath: Secondary | ICD-10-CM

## 2019-08-08 DIAGNOSIS — D72829 Elevated white blood cell count, unspecified: Secondary | ICD-10-CM

## 2019-08-08 DIAGNOSIS — R05 Cough: Secondary | ICD-10-CM | POA: Diagnosis not present

## 2019-08-08 LAB — CBC WITH DIFFERENTIAL/PLATELET
Basophils Absolute: 0.1 10*3/uL (ref 0.0–0.1)
Basophils Relative: 0.6 % (ref 0.0–3.0)
Eosinophils Absolute: 0.3 10*3/uL (ref 0.0–0.7)
Eosinophils Relative: 2.2 % (ref 0.0–5.0)
HCT: 38.8 % (ref 36.0–46.0)
Hemoglobin: 12.9 g/dL (ref 12.0–15.0)
Lymphocytes Relative: 19.7 % (ref 12.0–46.0)
Lymphs Abs: 2.7 10*3/uL (ref 0.7–4.0)
MCHC: 33.2 g/dL (ref 30.0–36.0)
MCV: 91.5 fl (ref 78.0–100.0)
Monocytes Absolute: 0.6 10*3/uL (ref 0.1–1.0)
Monocytes Relative: 4.6 % (ref 3.0–12.0)
Neutro Abs: 10 10*3/uL — ABNORMAL HIGH (ref 1.4–7.7)
Neutrophils Relative %: 72.9 % (ref 43.0–77.0)
Platelets: 306 10*3/uL (ref 150.0–400.0)
RBC: 4.24 Mil/uL (ref 3.87–5.11)
RDW: 14.5 % (ref 11.5–15.5)
WBC: 13.7 10*3/uL — ABNORMAL HIGH (ref 4.0–10.5)

## 2019-08-08 NOTE — Progress Notes (Signed)
Subjective:    Patient ID: Lisa Curry, female    DOB: 1971-08-13, 48 y.o.   MRN: [id number]  HPI  [name] is a 48 year old female patient of Dr. Danise [name] with a history of OSA, fatty liver, Type 2 diabetes, MDD, Hyperlipidemia who presents today to discuss recent lab results.  She is currently following with the [facility] in [place] with her last visit being 08/01/19. Labs completed during that visit include A1C which was 6.2, vitamin D which was 53.8, CBC with leukocytosis and elevated absolute neutrophils, CMP which was unremarkable.  She is frustrated by her recent visit with [facility] as the MD did not discuss her lab results. The patient notices an elevated WBC count and is very worried. She is also not aware of what her other lab results mean as no one has explained.  She completed a Medrol Dose Pack on 07/30/19 for acute back pain. She's checking her glucose at home which is ranging 120's. She ran out of her 50,000 units of Vitamin D and is taking OTC treatment.   Over the last two weeks she's feeling "run down and drained". 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 She was diagnosed with "chronic caregiver fatigue" months ago. She denies acute cough, chills, body aches, known exposure to Covid-19, loss of taste/smell. She has chronic allergies. She has noticed some shortness of breath, headache. Overall she's feeling well and her typical self. She is caring for her grandmother so she hardly leaves her home.   Review of Systems  Constitutional: Positive for fatigue. Negative for chills.  HENT: Positive for congestion and postnasal drip. Negative for sinus pressure and sore throat.   Respiratory: Positive for shortness of breath. Negative for cough.   Cardiovascular: Negative for chest pain.  Musculoskeletal: Negative for myalgias.  Allergic/Immunologic: Positive for environmental allergies.  Neurological:  Negative for headaches.       Past Medical History:  Diagnosis Date  . Allergy   . Anemia   . Anxiety   . Barrett esophagus    Don Bulla, have not received EGD report  . Chronic back pain   . Constipation   . Depression with anxiety   . Diabetes mellitus without complication ([place])    DSME 04/2014  . Edema, lower extremity   . Fatty liver   . Gastric polyp 05/2012   h/o EGD  . GERD (gastroesophageal reflux disease)   . History of Helicobacter pylori infection   . HLD (hyperlipidemia)   . Hyperlipidemia   . Vitamin D deficiency      Social History   Socioeconomic History  . Marital status: Married    Spouse name: W. Rosio Cuthbert  . Number of children: 2  . Years of education: Not on file  . Highest education level: Bachelor's degree (e.g., BA, AB, BS)  Occupational History  . Occupation: Pharmacist, hospital, care giver  Social Needs  . Financial resource strain: Not on file  . Food insecurity    Worry: Not on file    Inability: Not on file  . Transportation needs    Medical: Not on file    Non-medical: Not on file  Tobacco Use  . Smoking status: Never Smoker  . Smokeless tobacco: Never Used  Substance and Sexual Activity  . Alcohol use: Yes    Alcohol/week: 0.0 standard drinks    Comment: occasional  . Drug use: No  . Sexual activity:  Not on file  Lifestyle  . Physical activity    Days per week: Not on file    Minutes per session: Not on file  . Stress: Not on file  Relationships  . Social Herbalist on phone: Not on file    Gets together: Not on file    Attends religious service: Not on file    Active member of club or organization: Not on file    Attends meetings of clubs or organizations: Not on file    Relationship status: Not on file  . Intimate partner violence    Fear of current or ex partner: Not on file    Emotionally abused: Not on file    Physically abused: Not on file    Forced sexual activity: Not on file  Other Topics Concern  . Not on  file  Social History Narrative   Lives with husband and 2 children, 2 dogs, 2 cats and 4 ducks and 16 chickens and a fish   Occ: special ed Oceanographer.   Activity: no regular exercise   Diet: good water, fruits/vegetables daily   Right handed   Caffeine: daily    Past Surgical History:  Procedure Laterality Date  . CESAREAN SECTION  6/95, 4/98  . DILATION AND CURETTAGE OF UTERUS  2011  . ESOPHAGOGASTRODUODENOSCOPY  2012?   [facility] at [facility]  . ESOPHAGOGASTRODUODENOSCOPY  10/2017   WNL Ardis Hughs)  . LUMBAR DISC SURGERY  03/1995  . NASAL SINUS SURGERY  03/2001    Family History  Problem Relation Age of Onset  . Breast cancer Maternal Grandmother 56  . Dementia Maternal Grandmother   . Heart disease Paternal Grandfather   . Other Sister        Postural Orthostatic Tachycardia Syndrome  . Supraventricular tachycardia Sister   . Other Maternal Aunt        hysterectomy for irregular bleed with abnormal pap  . Hyperlipidemia Mother   . Alzheimer's disease Maternal Grandfather     Allergies  Allergen Reactions  . Povidone Iodine Hives  . Biaxin [Clarithromycin] Hives  . Metformin And Related Diarrhea    Current Outpatient Medications on File Prior to Visit  Medication Sig Dispense Refill  . acetaminophen-codeine (TYLENOL #3) 300-30 MG tablet Take 1 tablet by mouth every 8 (eight) hours as needed for moderate pain. 15 tablet 0  . Aspirin-Acetaminophen-Caffeine (EXCEDRIN PO) Take 1 capsule by mouth daily. As needed    . atorvastatin (LIPITOR) 20 MG tablet TAKE 1 TABLET (20 MG TOTAL) BY MOUTH DAILY AT 6 PM. 90 tablet 0  . buPROPion (WELLBUTRIN SR) 150 MG 12 hr tablet TAKE 1 TABLET BY MOUTH TWICE A DAY 180 tablet 0  . Cholecalciferol (VITAMIN D3) 125 MCG (5000 UT) TABS Take 1 tablet by mouth daily.    . clonazePAM (KLONOPIN) 0.5 MG tablet TAKE 1 TABLET BY MOUTH TWICE A DAY AS NEEDED FOR ANXIETY 60 tablet 0  . cyclobenzaprine (FLEXERIL) 10 MG tablet Take 1  tablet (10 mg total) by mouth 2 (two) times daily as needed for muscle spasms (sedation precautions). 30 tablet 1  . dexlansoprazole (DEXILANT) 60 MG capsule TAKE 1 CAPSULE BY MOUTH EVERY DAY 90 capsule 1  . dextromethorphan-guaiFENesin (MUCINEX DM) 30-600 MG 12hr tablet Take 1 tablet by mouth as needed for cough.    . docusate sodium (COLACE) 100 MG capsule Take 1 capsule (100 mg total) by mouth 2 (two) times daily. 180 capsule 0  .  fluticasone (FLONASE) 50 MCG/ACT nasal spray Place into both nostrils daily.    Marland Kitchen glucose blood (ONE TOUCH ULTRA TEST) test strip Use to check sugar twice daily and as needed. Dx: E11.65 300 each 3  . ibuprofen (ADVIL,MOTRIN) 600 MG tablet Take 600 mg by mouth daily.    Elmore Guise Devices (ONE TOUCH DELICA LANCING DEV) MISC Use as directed with lancets and one touch meter E11.65 1 each 0  . LINZESS 145 MCG CAPS capsule TAKE 1 CAPSULE (145 MCG TOTAL) BY MOUTH DAILY BEFORE BREAKFAST. 30 capsule 0  . liraglutide (VICTOZA) 18 MG/3ML SOPN Inject 0.1 mLs (0.6 mg total) into the skin daily. 3 pen 0  . magnesium oxide (MAG-OX) 400 MG tablet Take 400 mg by mouth daily.    . meclizine (ANTIVERT) 12.5 MG tablet Take 12.5 mg by mouth 3 (three) times daily as needed for dizziness.    . Multiple Vitamin (MULTIVITAMIN) capsule Take 1 capsule by mouth daily.    Glory Rosebush DELICA LANCETS 99991111 MISC Check blood sugar twice daily and as directed. Dx E11.65 200 each 0  . pioglitazone (ACTOS) 30 MG tablet TAKE 1 TABLET BY MOUTH EVERY DAY 90 tablet 1  . polyethylene glycol powder (GLYCOLAX/MIRALAX) 17 GM/SCOOP powder Take 17 g by mouth daily. 3350 g 0  . POTASSIUM PO Take by mouth daily.    . ranitidine (ZANTAC) 150 MG tablet Take 150 mg by mouth at bedtime.    . sitaGLIPtin (JANUVIA) 50 MG tablet Take 1 tablet (50 mg total) by mouth daily. 30 tablet 3  . TRADJENTA 5 MG TABS tablet TAKE 1 TABLET BY MOUTH EVERY DAY 30 tablet 5  . Venlafaxine HCl 225 MG TB24 TAKE 1 TABLET (225 MG TOTAL) BY  MOUTH DAILY. 90 tablet 1  . Vitamin D, Ergocalciferol, (DRISDOL) 1.25 MG (50000 UT) CAPS capsule Take 1 capsule (50,000 Units total) by mouth every 7 (seven) days. 4 capsule 0   Current Facility-Administered Medications on File Prior to Visit  Medication Dose Route Frequency Provider Last Rate Last Dose  . diclofenac sodium (VOLTAREN) 1 % transdermal gel 2 g  2 g Topical QID Jessy Oto, MD        BP 136/74   Pulse 82   Temp 97.6 F (36.4 C) (Temporal)   Ht 5\' 4"  (1.626 m)   Wt 274 lb 4 oz (124.4 kg)   LMP 07/14/2019   SpO2 100%   BMI 47.07 kg/m    Objective:   Physical Exam  Constitutional: She appears well-nourished.  Neck: Neck supple.  Cardiovascular: Normal rate and regular rhythm.  Respiratory: Effort normal and breath sounds normal.  Skin: Skin is warm and dry.  Psychiatric: She has a normal mood and affect.  Appears anxious, tearful during exam           Assessment & Plan:

## 2019-08-08 NOTE — Telephone Encounter (Signed)
Per chart review pt already has appt with Gentry Fitz NP today at 9 AM.

## 2019-08-08 NOTE — Patient Instructions (Signed)
Complete xray(s) and lab prior to leaving today. I will notify you of your results once received.  It was a pleasure meeting you!

## 2019-08-08 NOTE — Telephone Encounter (Signed)
Patient evaluated.  

## 2019-08-08 NOTE — Telephone Encounter (Signed)
[facility] - Client Nonclinical Telephone Record AccessNurse Client [facility] - Client Client Site [facility] Physician Ria Bush - MD Contact Type Call Who Is Calling Patient / Member / Family / Caregiver Caller Name Alva Phone Number [phone] Patient Name Lisa Curry Patient DOB 26-Jul-1971 Call Type Message Only Information Provided Reason for Call Request to Schedule Office Appointment Initial Comment Caller states she wants to schedule an appointment. Additional Comment Provided caller with office hours. Disp. Time Disposition Final User 08/07/2019 6:07:47 PM General Information Provided Yes Rulon Eisenmenger Call Closed By: Rulon Eisenmenger Transaction Date/Time: 08/07/2019 6:04:55 PM (ET)

## 2019-08-08 NOTE — Assessment & Plan Note (Addendum)
Noted on recent CBC also with increased absolute neurophils.  She did complete a course of prednisone two days prior to lab draw so this could be contributing. Today she actually appears well.  Repeat CBC with diff pending. Given her shortness of breath and fatigue we will check a chest xray. Consider Covid testing although she seems pretty low risk. Await results.

## 2019-08-14 DIAGNOSIS — M5416 Radiculopathy, lumbar region: Secondary | ICD-10-CM | POA: Diagnosis not present

## 2019-08-20 DIAGNOSIS — M5416 Radiculopathy, lumbar region: Secondary | ICD-10-CM | POA: Diagnosis not present

## 2019-08-22 ENCOUNTER — Ambulatory Visit (INDEPENDENT_AMBULATORY_CARE_PROVIDER_SITE_OTHER): Payer: BC Managed Care – PPO | Admitting: Family Medicine

## 2019-08-27 DIAGNOSIS — M5136 Other intervertebral disc degeneration, lumbar region: Secondary | ICD-10-CM | POA: Diagnosis not present

## 2019-08-29 ENCOUNTER — Telehealth: Payer: Self-pay

## 2019-08-29 DIAGNOSIS — G4733 Obstructive sleep apnea (adult) (pediatric): Secondary | ICD-10-CM

## 2019-08-29 NOTE — Telephone Encounter (Signed)
Autopap reordered. FU to be scheduled with me or NP.

## 2019-08-29 NOTE — Addendum Note (Signed)
Addended by: Star Age on: 08/29/2019 02:25 PM   Modules accepted: Orders

## 2019-08-29 NOTE — Telephone Encounter (Signed)
I have reached out to [place] for assistance.

## 2019-08-29 NOTE — Telephone Encounter (Signed)
Received this notice from [employer]: "Just checked. She is [employer] so all we need is an updated RX."  Order from February 2020 is for an auto-pap 6-11 cm H2O.

## 2019-08-29 NOTE — Telephone Encounter (Signed)
Pt has called the sleep lab stating that she is ready to get set-up with the CPAP machine now. Pt saw Dr. Rexene Alberts 09/25/2018 and had a HST on 10/17/2018. Dr. Rexene Alberts ordered AutoPAP but patient denied set-up at that time. Please follow up with patient to let her know about order and next step with MD.

## 2019-08-29 NOTE — Telephone Encounter (Signed)
I called pt and explained the auto-pap process and compliance requirements. Will send order to [facility]. A follow up appt was made for 11/27/19 at 1:00pm. Pt verbalized understanding of recommendations. Pt had no questions at this time but was encouraged to call back if questions arise.

## 2019-08-30 ENCOUNTER — Other Ambulatory Visit: Payer: Self-pay | Admitting: Family Medicine

## 2019-08-30 NOTE — Telephone Encounter (Signed)
Name of Medication: Clonazepam Name of Pharmacy: [facility] Last Fill or Written Date and Quantity: 07/24/19, #60 Last Office Visit and Type: 08/08/19, acute with Anda Kraft Next Office Visit and Type: 09/18/19, CPE Last Controlled Substance Agreement Date: 04/24/17 Last UDS: 10/16/14

## 2019-09-02 NOTE — Telephone Encounter (Signed)
ERx 

## 2019-09-03 ENCOUNTER — Encounter (INDEPENDENT_AMBULATORY_CARE_PROVIDER_SITE_OTHER): Payer: Self-pay

## 2019-09-04 ENCOUNTER — Ambulatory Visit (INDEPENDENT_AMBULATORY_CARE_PROVIDER_SITE_OTHER): Payer: BC Managed Care – PPO | Admitting: Physician Assistant

## 2019-09-04 ENCOUNTER — Other Ambulatory Visit (INDEPENDENT_AMBULATORY_CARE_PROVIDER_SITE_OTHER): Payer: Self-pay | Admitting: Physician Assistant

## 2019-09-04 DIAGNOSIS — K5909 Other constipation: Secondary | ICD-10-CM

## 2019-09-04 NOTE — Telephone Encounter (Signed)
FYI Pt has called to inform that she has not heard from anyone re: her CPAP.  At time of call phone rep did not see phone note re: previous conversation with RN.  Phone rep spoke with RN then called pt to provide company name and # to pt [employer] [phone].  Voicemail was left for pt with this information.

## 2019-09-04 NOTE — Telephone Encounter (Signed)
I have also reached out to [employer] and asked them to call the pt.

## 2019-09-05 MED ORDER — LINACLOTIDE 145 MCG PO CAPS: 145.0000 ug | ORAL_CAPSULE | Freq: Every day | ORAL | 0 refills | Status: DC

## 2019-09-10 DIAGNOSIS — M5136 Other intervertebral disc degeneration, lumbar region: Secondary | ICD-10-CM | POA: Diagnosis not present

## 2019-09-10 DIAGNOSIS — M961 Postlaminectomy syndrome, not elsewhere classified: Secondary | ICD-10-CM | POA: Insufficient documentation

## 2019-09-10 DIAGNOSIS — M51369 Other intervertebral disc degeneration, lumbar region without mention of lumbar back pain or lower extremity pain: Secondary | ICD-10-CM | POA: Insufficient documentation

## 2019-09-11 ENCOUNTER — Other Ambulatory Visit: Payer: Self-pay

## 2019-09-18 ENCOUNTER — Other Ambulatory Visit: Payer: Self-pay

## 2019-09-18 ENCOUNTER — Encounter: Payer: Self-pay | Admitting: Family Medicine

## 2019-09-18 ENCOUNTER — Ambulatory Visit (INDEPENDENT_AMBULATORY_CARE_PROVIDER_SITE_OTHER): Payer: BC Managed Care – PPO | Admitting: Family Medicine

## 2019-09-18 VITALS — BP 118/68 | HR 93 | Temp 97.6°F | Ht 63.5 in | Wt 280.3 lb

## 2019-09-18 DIAGNOSIS — Z1211 Encounter for screening for malignant neoplasm of colon: Secondary | ICD-10-CM | POA: Diagnosis not present

## 2019-09-18 DIAGNOSIS — K219 Gastro-esophageal reflux disease without esophagitis: Secondary | ICD-10-CM

## 2019-09-18 DIAGNOSIS — E785 Hyperlipidemia, unspecified: Secondary | ICD-10-CM

## 2019-09-18 DIAGNOSIS — E119 Type 2 diabetes mellitus without complications: Secondary | ICD-10-CM

## 2019-09-18 DIAGNOSIS — F331 Major depressive disorder, recurrent, moderate: Secondary | ICD-10-CM | POA: Diagnosis not present

## 2019-09-18 DIAGNOSIS — B351 Tinea unguium: Secondary | ICD-10-CM

## 2019-09-18 DIAGNOSIS — Z6841 Body Mass Index (BMI) 40.0 and over, adult: Secondary | ICD-10-CM

## 2019-09-18 DIAGNOSIS — K5909 Other constipation: Secondary | ICD-10-CM

## 2019-09-18 DIAGNOSIS — G4733 Obstructive sleep apnea (adult) (pediatric): Secondary | ICD-10-CM

## 2019-09-18 DIAGNOSIS — Z0001 Encounter for general adult medical examination with abnormal findings: Secondary | ICD-10-CM | POA: Diagnosis not present

## 2019-09-18 DIAGNOSIS — D72829 Elevated white blood cell count, unspecified: Secondary | ICD-10-CM

## 2019-09-18 MED ORDER — PIOGLITAZONE HCL 30 MG PO TABS: 30.0000 mg | ORAL_TABLET | Freq: Every day | ORAL | 3 refills | Status: DC

## 2019-09-18 MED ORDER — BUPROPION HCL ER (SR) 150 MG PO TB12: 150.0000 mg | ORAL_TABLET | Freq: Two times a day (BID) | ORAL | 3 refills | Status: DC

## 2019-09-18 MED ORDER — LINACLOTIDE 145 MCG PO CAPS: 145.0000 ug | ORAL_CAPSULE | Freq: Every day | ORAL | 11 refills | Status: DC

## 2019-09-18 MED ORDER — DEXILANT 60 MG PO CPDR: DELAYED_RELEASE_CAPSULE | ORAL | 3 refills | Status: DC

## 2019-09-18 MED ORDER — ATORVASTATIN CALCIUM 20 MG PO TABS: 20.0000 mg | ORAL_TABLET | Freq: Every day | ORAL | 3 refills | Status: DC

## 2019-09-18 MED ORDER — VENLAFAXINE HCL ER 225 MG PO TB24: 225.0000 mg | ORAL_TABLET | Freq: Every day | ORAL | 3 refills | Status: DC

## 2019-09-18 NOTE — Patient Instructions (Addendum)
Stop [place]. You have room to increase victoza- check with [facility].  We will refer you to psychiatry for further evaluation  Try funginail nail laquer (OTC) for possible fungal nail infection.  Good to see you today, call us with questions. Return as needed or in 6 months for follow up visit.   Health Maintenance, Female Adopting a healthy lifestyle and getting preventive care are important in promoting health and wellness. Ask your health care provider about:  The right schedule for you to have regular tests and exams.  Things you can do on your own to prevent diseases and keep yourself healthy. What should I know about diet, weight, and exercise? Eat a healthy diet   Eat a diet that includes plenty of vegetables, fruits, low-fat dairy products, and lean protein.  Do not eat a lot of foods that are high in solid fats, added sugars, or sodium. Maintain a healthy weight Body mass index (BMI) is used to identify weight problems. It estimates body fat based on height and weight. Your health care provider can help determine your BMI and help you achieve or maintain a healthy weight. Get regular exercise Get regular exercise. This is one of the most important things you can do for your health. Most adults should:  Exercise for at least 150 minutes each week. The exercise should increase your heart rate and make you sweat (moderate-intensity exercise).  Do strengthening exercises at least twice a week. This is in addition to the moderate-intensity exercise.  Spend less time sitting. Even light physical activity can be beneficial. Watch cholesterol and blood lipids Have your blood tested for lipids and cholesterol at 49 years of age, then have this test every 5 years. Have your cholesterol levels checked more often if:  Your lipid or cholesterol levels are high.  You are older than 49 years of age.  You are at high risk for heart disease. What should I know  about cancer screening? Depending on your health history and family history, you may need to have cancer screening at various ages. This may include screening for:  Breast cancer.  Cervical cancer.  Colorectal cancer.  Skin cancer.  Lung cancer. What should I know about heart disease, diabetes, and high blood pressure? Blood pressure and heart disease  High blood pressure causes heart disease and increases the risk of stroke. This is more likely to develop in people who have high blood pressure readings, are of African descent, or are overweight.  Have your blood pressure checked: ? Every 3-5 years if you are 81-64 years of age. ? Every year if you are 49 years old or older. Diabetes Have regular diabetes screenings. This checks your fasting blood sugar level. Have the screening done:  Once every three years after age 65 if you are at a normal weight and have a low risk for diabetes.  More often and at a younger age if you are overweight or have a high risk for diabetes. What should I know about preventing infection? Hepatitis B If you have a higher risk for hepatitis B, you should be screened for this virus. Talk with your health care provider to find out if you are at risk for hepatitis B infection. Hepatitis C Testing is recommended for:  Everyone born from [birth year] through 1966.  Anyone with known risk factors for hepatitis C. Sexually transmitted infections (STIs)  Get screened for STIs, including gonorrhea and chlamydia, if: ? You are sexually active and are  younger than 49 years of age. ? You are older than 49 years of age and your health care provider tells you that you are at risk for this type of infection. ? Your sexual activity has changed since you were last screened, and you are at increased risk for chlamydia or gonorrhea. Ask your health care provider if you are at risk.  Ask your health care provider about whether you are at high risk for HIV. Your health care  provider may recommend a prescription medicine to help prevent HIV infection. If you choose to take medicine to prevent HIV, you should first get tested for HIV. You should then be tested every 3 months for as long as you are taking the medicine. Pregnancy  If you are about to stop having your period (premenopausal) and you may become pregnant, seek counseling before you get pregnant.  Take 400 to 800 micrograms (mcg) of folic acid every day if you become pregnant.  Ask for birth control (contraception) if you want to prevent pregnancy. Osteoporosis and menopause Osteoporosis is a disease in which the bones lose minerals and strength with aging. This can result in bone fractures. If you are 53 years old or older, or if you are at risk for osteoporosis and fractures, ask your health care provider if you should:  Be screened for bone loss.  Take a calcium or vitamin D supplement to lower your risk of fractures.  Be given hormone replacement therapy (HRT) to treat symptoms of menopause. Follow these instructions at home: Lifestyle  Do not use any products that contain nicotine or tobacco, such as cigarettes, e-cigarettes, and chewing tobacco. If you need help quitting, ask your health care provider.  Do not use street drugs.  Do not share needles.  Ask your health care provider for help if you need support or information about quitting drugs. Alcohol use  Do not drink alcohol if: ? Your health care provider tells you not to drink. ? You are pregnant, may be pregnant, or are planning to become pregnant.  If you drink alcohol: ? Limit how much you use to 0-1 drink a day. ? Limit intake if you are breastfeeding.  Be aware of how much alcohol is in your drink. In the [place] one drink equals one 12 oz bottle of beer (355 mL), one 5 oz glass of wine (148 mL), or one 1 oz glass of hard liquor (44 mL). General instructions  Schedule regular health, dental, and eye exams.  Stay current  with your vaccines.  Tell your health care provider if: ? You often feel depressed. ? You have ever been abused or do not feel safe at home. Summary  Adopting a healthy lifestyle and getting preventive care are important in promoting health and wellness.  Follow your health care provider's instructions about healthy diet, exercising, and getting tested or screened for diseases.  Follow your health care provider's instructions on monitoring your cholesterol and blood pressure. This information is not intended to replace advice given to you by your health care provider. Make sure you discuss any questions you have with your health care provider. Document Released: 03/21/2011 Document Revised: 08/29/2018 Document Reviewed: 08/29/2018 Elsevier Patient Education  2020 [employer].

## 2019-09-18 NOTE — Progress Notes (Signed)
This visit was conducted in person.  BP 118/68 (BP Location: Left Arm, Patient Position: Sitting, Cuff Size: Large)   Pulse 93   Temp 97.6 F (36.4 C) (Temporal)   Ht 5' 3.5" (1.613 m)   Wt 280 lb 5 oz (127.1 kg)   LMP 08/15/2019   SpO2 97%   BMI 48.88 kg/m    CC: CPE Subjective:    Patient ID: Lisa Curry, female    DOB: 04-09-1971, 48 y.o.   MRN: [id number]  HPI: Lisa Curry is a 48 y.o. female presenting on 09/18/2019 for Annual Exam   Continues caring for grandmother - significant stress due to this. 2 days on, 4 days off. Struggling with emotionality. Has seen counselor without benefit.   Recent OSA on CPAP followed by neurology. [employer] [facility]. Deciding on buying vs renting CPAP.   L lateral great toenail abnormal growth - 9 months ago noticed "infection under nail" poorly growing since.  Preventative: Colon cancer screening - discussed options - agrees to iFOB.  Pap Smear: 2012, 2015, 2016, 2019 WNL (absent transformation zone). LMP 08/15/2019, regular monthly  Mammogram: 12/2013 normal. Due, declines repeat at this time due to pandemic. Maternal grandmother with h/o breast cancer.  EGD - ?h/o barrett's during EGD at [facility]. Rpt EGD with [facility] 10/2017 - WNL  Flu: yearly Pneumovax 2015 Tetanus: >10 yrs - declines.  Seat belt use discussed  Sunscreen use discussed. No changing moles on skin. Non smoker  Alcohol - none  Eye exam: every other year. 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 Activity: no regular exercise  Diet: good water, fruits/vegetables daily      Relevant past medical, surgical, family and social history reviewed and updated as indicated. Interim medical history since our last visit reviewed. Allergies and medications reviewed and updated. Outpatient Medications Prior to Visit  Medication Sig Dispense  Refill  . acetaminophen-codeine (TYLENOL #3) 300-30 MG tablet Take 1 tablet by mouth every 8 (eight) hours as needed for moderate pain. 15 tablet 0  . Aspirin-Acetaminophen-Caffeine (EXCEDRIN PO) Take 1 capsule by mouth daily. As needed    . Cholecalciferol (VITAMIN D3) 125 MCG (5000 UT) TABS Take 1 tablet by mouth daily.    . clonazePAM (KLONOPIN) 0.5 MG tablet TAKE 1 TABLET BY MOUTH TWICE A DAY AS NEEDED FOR ANXIETY 60 tablet 0  . docusate sodium (COLACE) 100 MG capsule Take 1 capsule (100 mg total) by mouth 2 (two) times daily. (Patient taking differently: Take 100 mg by mouth 2 (two) times daily. As needed) 180 capsule 0  . fluticasone (FLONASE) 50 MCG/ACT nasal spray Place into both nostrils daily. As needed    . glucose blood (ONE TOUCH ULTRA TEST) test strip Use to check sugar twice daily and as needed. Dx: E11.65 300 each 3  . ibuprofen (ADVIL,MOTRIN) 600 MG tablet Take 600 mg by mouth daily.    Marland Kitchen liraglutide (VICTOZA) 18 MG/3ML SOPN Inject 0.1 mLs (0.6 mg total) into the skin daily. 3 pen 0  . meclizine (ANTIVERT) 12.5 MG tablet Take 12.5 mg by mouth 3 (three) times daily as needed for dizziness.    . Multiple Vitamin (MULTIVITAMIN) capsule Take 1 capsule by mouth daily.    Glory Rosebush DELICA LANCETS [id number] MISC Check blood sugar twice daily and as directed. Dx E11.65 200 each 0  . polyethylene glycol powder (  GLYCOLAX/MIRALAX) 17 GM/SCOOP powder Take 17 g by mouth daily. 3350 g 0  . atorvastatin (LIPITOR) 20 MG tablet TAKE 1 TABLET (20 MG TOTAL) BY MOUTH DAILY AT 6 PM. 90 tablet 0  . buPROPion (WELLBUTRIN SR) 150 MG 12 hr tablet TAKE 1 TABLET BY MOUTH TWICE A DAY 180 tablet 0  . dexlansoprazole (DEXILANT) 60 MG capsule TAKE 1 CAPSULE BY MOUTH EVERY DAY 90 capsule 1  . linaclotide (LINZESS) 145 MCG CAPS capsule Take 1 capsule (145 mcg total) by mouth daily. 30 capsule 0  . pioglitazone (ACTOS) 30 MG tablet TAKE 1 TABLET BY MOUTH EVERY DAY 90 tablet 1  . Venlafaxine HCl 225 MG TB24 TAKE 1  TABLET (225 MG TOTAL) BY MOUTH DAILY. 90 tablet 1  . cyclobenzaprine (FLEXERIL) 10 MG tablet Take 1 tablet (10 mg total) by mouth 2 (two) times daily as needed for muscle spasms (sedation precautions). (Patient not taking: Reported on 09/18/2019) 30 tablet 1  . gabapentin (NEURONTIN) 300 MG capsule Take 1 capsule (300 mg total) by mouth 3 (three) times daily.    Elmore Guise Devices (ONE TOUCH DELICA LANCING DEV) MISC Use as directed with lancets and one touch meter E11.65 (Patient not taking: Reported on 09/18/2019) 1 each 0  . dextromethorphan-guaiFENesin (MUCINEX DM) 30-600 MG 12hr tablet Take 1 tablet by mouth as needed for cough.    . magnesium oxide (MAG-OX) 400 MG tablet Take 400 mg by mouth daily.    Marland Kitchen POTASSIUM PO Take by mouth daily.    . ranitidine (ZANTAC) 150 MG tablet Take 150 mg by mouth at bedtime.    . sitaGLIPtin (JANUVIA) 50 MG tablet Take 1 tablet (50 mg total) by mouth daily. 30 tablet 3  . TRADJENTA 5 MG TABS tablet TAKE 1 TABLET BY MOUTH EVERY DAY 30 tablet 5  . Vitamin D, Ergocalciferol, (DRISDOL) 1.25 MG (50000 UT) CAPS capsule Take 1 capsule (50,000 Units total) by mouth every 7 (seven) days. 4 capsule 0   Facility-Administered Medications Prior to Visit  Medication Dose Route Frequency Provider Last Rate Last Admin  . diclofenac sodium (VOLTAREN) 1 % transdermal gel 2 g  2 g Topical QID Jessy Oto, MD         Per HPI unless specifically indicated in ROS section below Review of Systems  Constitutional: Positive for appetite change. Negative for activity change, chills, fatigue, fever and unexpected weight change.  HENT: Negative for hearing loss.   Eyes: Negative for visual disturbance.  Respiratory: Positive for shortness of breath (anxiety related). Negative for cough, chest tightness and wheezing.   Cardiovascular: Negative for chest pain, palpitations and leg swelling.  Gastrointestinal: Positive for constipation (managed with miralax 1 capful daily). Negative  for abdominal distention, abdominal pain, blood in stool, diarrhea, nausea and vomiting.  Genitourinary: Negative for difficulty urinating and hematuria.  Musculoskeletal: Negative for arthralgias, myalgias and neck pain.  Skin: Negative for rash.  Neurological: Negative for dizziness, seizures, syncope and headaches.  Hematological: Negative for adenopathy. Does not bruise/bleed easily.  Psychiatric/Behavioral: Positive for dysphoric mood. The patient is nervous/anxious.    Objective:    BP 118/68 (BP Location: Left Arm, Patient Position: Sitting, Cuff Size: Large)   Pulse 93   Temp 97.6 F (36.4 C) (Temporal)   Ht 5' 3.5" (1.613 m)   Wt 280 lb 5 oz (127.1 kg)   LMP 08/15/2019   SpO2 97%   BMI 48.88 kg/m   Wt Readings from Last 3 Encounters:  09/18/19 280  lb 5 oz (127.1 kg)  08/08/19 274 lb 4 oz (124.4 kg)  08/01/19 269 lb (122 kg)    Physical Exam Vitals and nursing note reviewed.  Constitutional:      General: She is not in acute distress.    Appearance: Normal appearance. She is well-developed. She is obese. She is not ill-appearing.  HENT:     Head: Normocephalic and atraumatic.     Right Ear: Hearing, tympanic membrane, ear canal and external ear normal.     Left Ear: Hearing, tympanic membrane, ear canal and external ear normal.     Mouth/Throat:     Pharynx: Uvula midline.  Eyes:     General: No scleral icterus.    Extraocular Movements: Extraocular movements intact.     Conjunctiva/sclera: Conjunctivae normal.     Pupils: Pupils are equal, round, and reactive to light.  Neck:     Vascular: No carotid bruit.  Cardiovascular:     Rate and Rhythm: Normal rate and regular rhythm.     Pulses: Normal pulses.          Radial pulses are 2+ on the right side and 2+ on the left side.     Heart sounds: Normal heart sounds. No murmur.  Pulmonary:     Effort: Pulmonary effort is normal. No respiratory distress.     Breath sounds: Normal breath sounds. No wheezing,  rhonchi or rales.  Abdominal:     General: Abdomen is flat. Bowel sounds are normal. There is no distension.     Palpations: Abdomen is soft. There is no mass.     Tenderness: There is no abdominal tenderness. There is no guarding or rebound.     Hernia: No hernia is present.  Musculoskeletal:        General: Normal range of motion.     Cervical back: Normal range of motion and neck supple.     Right lower leg: No edema.     Left lower leg: No edema.  Lymphadenopathy:     Cervical: No cervical adenopathy.  Skin:    General: Skin is warm and dry.     Findings: No rash.     Comments: L lateral great toenail with poor growth and thickening  Neurological:     General: No focal deficit present.     Mental Status: She is alert and oriented to person, place, and time.     Comments: CN grossly intact, station and gait intact  Psychiatric:        Mood and Affect: Mood normal.        Behavior: Behavior normal.        Thought Content: Thought content normal.        Judgment: Judgment normal.       Results for orders placed or performed in visit on 08/08/19  CBC with Differential/Platelet  Result Value Ref Range   WBC 13.7 (H) 4.0 - 10.5 K/uL   RBC 4.24 3.87 - 5.11 Mil/uL   Hemoglobin 12.9 12.0 - 15.0 g/dL   HCT 38.8 36.0 - 46.0 %   MCV 91.5 78.0 - 100.0 fl   MCHC 33.2 30.0 - 36.0 g/dL   RDW 14.5 11.5 - 15.5 %   Platelets 306.0 150.0 - 400.0 K/uL   Neutrophils Relative % 72.9 43.0 - 77.0 %   Lymphocytes Relative 19.7 12.0 - 46.0 %   Monocytes Relative 4.6 3.0 - 12.0 %   Eosinophils Relative 2.2 0.0 - 5.0 %   Basophils  Relative 0.6 0.0 - 3.0 %   Neutro Abs 10.0 (H) 1.4 - 7.7 K/uL   Lymphs Abs 2.7 0.7 - 4.0 K/uL   Monocytes Absolute 0.6 0.1 - 1.0 K/uL   Eosinophils Absolute 0.3 0.0 - 0.7 K/uL   Basophils Absolute 0.1 0.0 - 0.1 K/uL   Lab Results  Component Value Date   HGBA1C 6.2 (H) 08/01/2019    Depression screen PHQ 2/9 09/18/2019 10/23/2018 10/08/2018 09/20/2018 07/26/2018   Decreased Interest 2 2 2 1 1   Down, Depressed, Hopeless 1 1 1 1 1   PHQ - 2 Score 3 3 3 2 2   Altered sleeping 2 1 0 0 0  Tired, decreased energy 3 3 3 1 2   Change in appetite 3 2 2  0 2  Feeling bad or failure about yourself  0 0 1 0 2  Trouble concentrating 2 2 1  0 1  Moving slowly or fidgety/restless 1 1 0 0 0  Suicidal thoughts 0 0 0 0 0  PHQ-9 Score 14 12 10 3 9   Difficult doing work/chores - - - - Somewhat difficult  Some recent data might be hidden    GAD 7 : Generalized Anxiety Score 09/18/2019 10/23/2018 10/08/2018 09/20/2018  Nervous, Anxious, on Edge 1 2 0 1  Control/stop worrying 0 1 0 0  Worry too much - different things 1 0 0 0  Trouble relaxing 1 1 1  0  Restless 0 0 0 0  Easily annoyed or irritable 2 1 1 1   Afraid - awful might happen 0 1 0 0  Total GAD 7 Score 5 6 2 2   Anxiety Difficulty - Somewhat difficult Not difficult at all Somewhat difficult   Assessment & Plan:  This visit occurred during the SARS-CoV-2 public health emergency.  Safety protocols were in place, including screening questions prior to the visit, additional usage of staff PPE, and extensive cleaning of exam room while observing appropriate contact time as indicated for disinfecting solutions.   Problem List Items Addressed This Visit    Type 2 diabetes mellitus without complication, without long-term current use of insulin (HCC)    Longstanding DPP4 inhibitor use along with actos. Unable to tolerate metformin. Reviewing chart, victoza was started by bariatric clinic 08/2018. Advised she stop [place], continue victoza and discuss victoza titration with bariatric clinic who prescribes this. May benefit from weekly GLP1 RA in place of victoza. A1c remains well controlled.       Relevant Medications   atorvastatin (LIPITOR) 20 MG tablet   pioglitazone (ACTOS) 30 MG tablet   OSA (obstructive sleep apnea)    Followed by neurology, on CPAP.       Onychomycosis    Anticipate due to fungal infection -  suggested trial funginail.       Morbid obesity with BMI of 45.0-49.9, adult ([place])    Weight gain noted. Followed [facility].       Relevant Medications   pioglitazone (ACTOS) 30 MG tablet   MDD (major depressive disorder), recurrent episode, moderate (HCC)    Chronic, poor control. Ongoing caregiver stressors. Has felt counseling was previously not helpful. Continues high dose effexor, wellbutrin - was unable to tolerate higher wellbutrin dose, and klonopin. Will refer to psychiatry for evaluation and further medication management.       Relevant Medications   buPROPion (WELLBUTRIN SR) 150 MG 12 hr tablet   Venlafaxine HCl 225 MG TB24   Other Relevant Orders   Ambulatory referral to Psychiatry  Leukocytosis    After recent steroid course.  Will check next labwork.       HLD (hyperlipidemia)    Chronic, stable on statin in diabetic. Continue. The 10-year ASCVD risk score Mikey Bussing DC Brooke Bonito., et al., 2013) is: 1.3%   Values used to calculate the score:     Age: 50 years     Sex: Female     Is Non-Hispanic African American: No     Diabetic: Yes     Tobacco smoker: No     Systolic Blood Pressure: 123456 mmHg     Is BP treated: No     HDL Cholesterol: 46 mg/dL     Total Cholesterol: 133 mg/dL       Relevant Medications   atorvastatin (LIPITOR) 20 MG tablet   GERD (gastroesophageal reflux disease)    Continue dexilant. Failed esomeprazole, omeprazole, pantoprazole in the past.       Relevant Medications   dexlansoprazole (DEXILANT) 60 MG capsule   linaclotide (LINZESS) 145 MCG CAPS capsule   Encounter for routine adult medical exam with abnormal findings - Primary    Preventative protocols reviewed and updated unless pt declined. Discussed healthy diet and lifestyle.       Chronic constipation    Continue linzess, miralax.       Relevant Medications   linaclotide (LINZESS) 145 MCG CAPS capsule    Other Visit Diagnoses    Special screening for  malignant neoplasms, colon       Relevant Orders   Fecal occult blood, imunochemical       Meds ordered this encounter  Medications  . atorvastatin (LIPITOR) 20 MG tablet    Sig: Take 1 tablet (20 mg total) by mouth daily at 6 PM.    Dispense:  90 tablet    Refill:  3  . buPROPion (WELLBUTRIN SR) 150 MG 12 hr tablet    Sig: Take 1 tablet (150 mg total) by mouth 2 (two) times daily.    Dispense:  180 tablet    Refill:  3  . dexlansoprazole (DEXILANT) 60 MG capsule    Sig: TAKE 1 CAPSULE BY MOUTH EVERY DAY    Dispense:  90 capsule    Refill:  3  . pioglitazone (ACTOS) 30 MG tablet    Sig: Take 1 tablet (30 mg total) by mouth daily.    Dispense:  90 tablet    Refill:  3  . linaclotide (LINZESS) 145 MCG CAPS capsule    Sig: Take 1 capsule (145 mcg total) by mouth daily.    Dispense:  30 capsule    Refill:  11  . Venlafaxine HCl 225 MG TB24    Sig: Take 1 tablet (225 mg total) by mouth daily.    Dispense:  90 tablet    Refill:  3   Orders Placed This Encounter  Procedures  . Fecal occult blood, imunochemical    Standing Status:   Future    Standing Expiration Date:   09/17/2020  . Ambulatory referral to Psychiatry    Referral Priority:   Routine    Referral Type:   Psychiatric    Referral Reason:   Specialty Services Required    Requested Specialty:   Psychiatry    Number of Visits Requested:   1   Patient instructions: Stop [place]. You have room to increase victoza- check with [facility].  We will refer you to psychiatry for further evaluation  Try funginail nail laquer (OTC) for  possible fungal nail infection.  Good to see you today, call us with questions. Return as needed or in 6 months for follow up visit.   Follow up plan: Return in about 6 months (around 03/18/2020) for follow up visit.  Ria Bush, MD

## 2019-09-21 DIAGNOSIS — B351 Tinea unguium: Secondary | ICD-10-CM | POA: Insufficient documentation

## 2019-09-21 DIAGNOSIS — K5909 Other constipation: Secondary | ICD-10-CM | POA: Insufficient documentation

## 2019-09-21 NOTE — Assessment & Plan Note (Addendum)
Chronic, stable on statin in diabetic. Continue. The 10-year ASCVD risk score Mikey Bussing DC Brooke Bonito., et al., 2013) is: 1.3%   Values used to calculate the score:     Age: 49 years     Sex: Female     Is Non-Hispanic African American: No     Diabetic: Yes     Tobacco smoker: No     Systolic Blood Pressure: 123456 mmHg     Is BP treated: No     HDL Cholesterol: 46 mg/dL     Total Cholesterol: 133 mg/dL

## 2019-09-21 NOTE — Assessment & Plan Note (Addendum)
Weight gain noted. Followed by [facility].

## 2019-09-21 NOTE — Assessment & Plan Note (Signed)
Preventative protocols reviewed and updated unless pt declined. Discussed healthy diet and lifestyle.  

## 2019-09-21 NOTE — Assessment & Plan Note (Signed)
Followed by neurology, on CPAP.

## 2019-09-21 NOTE — Assessment & Plan Note (Addendum)
Chronic, poor control. Ongoing caregiver stressors. Has felt counseling was previously not helpful. Continues high dose effexor, wellbutrin - was unable to tolerate higher wellbutrin dose, and klonopin. Will refer to psychiatry for evaluation and further medication management.

## 2019-09-21 NOTE — Assessment & Plan Note (Addendum)
Anticipate due to fungal infection - suggested trial funginail.

## 2019-09-21 NOTE — Assessment & Plan Note (Addendum)
Longstanding DPP4 inhibitor use along with actos. Unable to tolerate metformin. Reviewing chart, victoza was started by bariatric clinic 08/2018. Advised she stop [place], continue victoza and discuss victoza titration with bariatric clinic who prescribes this. May benefit from weekly GLP1 RA in place of victoza. A1c remains well controlled.

## 2019-09-21 NOTE — Assessment & Plan Note (Signed)
Continue dexilant. Failed esomeprazole, omeprazole, pantoprazole in the past.

## 2019-09-21 NOTE — Assessment & Plan Note (Signed)
Continue linzess, miralax.

## 2019-09-21 NOTE — Assessment & Plan Note (Signed)
After recent steroid course.  Will check next labwork.

## 2019-09-26 DIAGNOSIS — G4733 Obstructive sleep apnea (adult) (pediatric): Secondary | ICD-10-CM | POA: Diagnosis not present

## 2019-09-28 ENCOUNTER — Encounter (HOSPITAL_COMMUNITY): Payer: Self-pay | Admitting: Psychiatry

## 2019-09-28 ENCOUNTER — Ambulatory Visit (INDEPENDENT_AMBULATORY_CARE_PROVIDER_SITE_OTHER): Payer: BC Managed Care – PPO | Admitting: Psychiatry

## 2019-09-28 ENCOUNTER — Other Ambulatory Visit: Payer: Self-pay | Admitting: Family Medicine

## 2019-09-28 ENCOUNTER — Other Ambulatory Visit: Payer: Self-pay

## 2019-09-28 DIAGNOSIS — F419 Anxiety disorder, unspecified: Secondary | ICD-10-CM | POA: Diagnosis not present

## 2019-09-28 DIAGNOSIS — F331 Major depressive disorder, recurrent, moderate: Secondary | ICD-10-CM

## 2019-09-28 MED ORDER — VENLAFAXINE HCL ER 150 MG PO CP24: 150.0000 mg | ORAL_CAPSULE | Freq: Every day | ORAL | 0 refills | Status: DC

## 2019-09-28 MED ORDER — VENLAFAXINE HCL ER 37.5 MG PO CP24: ORAL_CAPSULE | ORAL | 0 refills | Status: DC

## 2019-09-28 MED ORDER — BUPROPION HCL ER (XL) 300 MG PO TB24: 300.0000 mg | ORAL_TABLET | ORAL | 0 refills | Status: DC

## 2019-09-28 MED ORDER — LAMOTRIGINE 25 MG PO TABS: ORAL_TABLET | ORAL | 0 refills | Status: DC

## 2019-09-28 NOTE — Progress Notes (Addendum)
Virtual Visit via Video Note  I connected with Lisa Curry on 09/28/19 at 11:00 AM EST by a video enabled telemedicine application and verified that I am speaking with the correct person using two identifiers.   I discussed the limitations of evaluation and management by telemedicine and the availability of in person appointments. The patient expressed understanding and agreed to proceed.   [place] Initial Assessment Note  Lisa Curry [id number] 49 y.o.  09/28/2019 12:01 PM  Chief Complaint:  I do not think my medicine working.  I am having a lot of crying spells.  History of Present Illness:  Shatari is a 49 year old Caucasian, employed female who is referred from her PCP Dr. Danise [name] for the management of her psychiatric symptoms.  Patient diagnosed with depression for many years and recently she feel her symptoms started to get worse.  Her biggest stressor is taking care of grandmother who is 75 year old. Patient told her husband and 2 elderly children does not listen help her.  She gets easily emotional, irritable, frustrated and angry.  Her 107 year old grandmother is living by herself and she has dementia and family is trying to help her but there are days when it is very stressful.  Patient feels that her PCP is trying medication for past few months and nothing is working.  She developed side effects from certain medication and like to get medication change.  She admitted on and off poor sleep, crying spells, irritability, depression but denies any suicidal thoughts or homicidal thoughts.  Patient admitted anhedonia, sometimes feeling hopeless and helpless.  She had tried cutting down the Effexor in the past but she had significant withdrawals and she could not do it.  Her current medicine is Wellbutrin, Effexor, Klonopin.  She is taking Effexor for more than 10 years and Wellbutrin added 4 years ago.  She is not seeing a therapist but wanted to see a spiritual counselor.   Patient denies drinking or using any illegal substances.  She denies any mania, psychosis or hallucination, nightmares, OCD or any panic attacks.  Patient denies drinking or using any illegal substances.  Reece Levy level is fair.  He is okay.  Patient has sleep apnea, GERD, fatty liver, type 2 diabetes, hyperlipidemia.   Past Psychiatric History/Hospitalization(s): Patient denies any history of psychiatric inpatient treatment but seen on and off psychiatrist for many years.  She initially saw Dr. Letta Moynahan and Dr. Roland Earl and then Dr. Tiajuana Amass.  Lately she is getting medication from her PCP.  She had tried Paxil that causes excessive sleep.  She also tried Celexa and Zoloft.  She had a good response with Zoloft but it was switched by Dr. Casimiro Needle to Celexa but stopped working.  Family History; Mother and grandmother has anxiety.  Grandmother had abuse Xanax.  Past Medical History:  Diagnosis Date  . Allergy   . Anemia   . Anxiety   . Barrett esophagus    [place], have not received EGD report  . Chronic back pain   . Constipation   . Depression with anxiety   . Diabetes mellitus without complication ([place])    DSME 04/2014  . Edema, lower extremity   . Fatty liver   . Gastric polyp 05/2012   h/o EGD  . GERD (gastroesophageal reflux disease)   . History of Helicobacter pylori infection   . HLD (hyperlipidemia)   . Hyperlipidemia   . Vitamin D deficiency     Traumatic brain injury: Denies any history of  traumatic brain injury.  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  Psychosocial History; Patient lives with her husband and 2 adult children.  Legal History; Patient denies any history of legal issues.  History Of Abuse; Patient denies any history of abuse.  Substance Abuse History; Patient denies any history of substance use.  Review of Systems: Psychiatric:  Neurologic: Headache:  No Seizure: No Paresthesias: No   Outpatient Encounter Medications as of 09/28/2019  Medication Sig  . acetaminophen-codeine (TYLENOL #3) 300-30 MG tablet Take 1 tablet by mouth every 8 (eight) hours as needed for moderate pain.  . Aspirin-Acetaminophen-Caffeine (EXCEDRIN PO) Take 1 capsule by mouth daily. As needed  . atorvastatin (LIPITOR) 20 MG tablet Take 1 tablet (20 mg total) by mouth daily at 6 PM.  . buPROPion (WELLBUTRIN XL) 300 MG 24 hr tablet Take 1 tablet (300 mg total) by mouth every morning.  . Cholecalciferol (VITAMIN D3) 125 MCG (5000 UT) TABS Take 1 tablet by mouth daily.  . clonazePAM (KLONOPIN) 0.5 MG tablet TAKE 1 TABLET BY MOUTH TWICE A DAY AS NEEDED FOR ANXIETY  . cyclobenzaprine (FLEXERIL) 10 MG tablet Take 1 tablet (10 mg total) by mouth 2 (two) times daily as needed for muscle spasms (sedation precautions). (Patient not taking: Reported on 09/18/2019)  . dexlansoprazole (DEXILANT) 60 MG capsule TAKE 1 CAPSULE BY MOUTH EVERY DAY  . docusate sodium (COLACE) 100 MG capsule Take 1 capsule (100 mg total) by mouth 2 (two) times daily. (Patient taking differently: Take 100 mg by mouth 2 (two) times daily. As needed)  . fluticasone (FLONASE) 50 MCG/ACT nasal spray Place into both nostrils daily. As needed  . gabapentin (NEURONTIN) 300 MG capsule Take 1 capsule (300 mg total) by mouth 3 (three) times daily.  Marland Kitchen glucose blood (ONE TOUCH ULTRA TEST) test strip Use to check sugar twice daily and as needed. Dx: E11.65  . ibuprofen (ADVIL,MOTRIN) 600 MG tablet Take 600 mg by mouth daily.  Marland Kitchen lamoTRIgine (LAMICTAL) 25 MG tablet Take one tab daily for 1 week and than 2 tab daily  . Lancet Devices (ONE TOUCH DELICA LANCING DEV) MISC Use as directed with lancets and one touch meter E11.65 (Patient not taking: Reported on 09/18/2019)  . linaclotide (LINZESS) 145 MCG CAPS capsule Take 1 capsule (145 mcg total) by mouth daily.  Marland Kitchen liraglutide (VICTOZA) 18 MG/3ML SOPN Inject 0.1 mLs (0.6 mg  total) into the skin daily.  . meclizine (ANTIVERT) 12.5 MG tablet Take 12.5 mg by mouth 3 (three) times daily as needed for dizziness.  . Multiple Vitamin (MULTIVITAMIN) capsule Take 1 capsule by mouth daily.  Glory Rosebush DELICA LANCETS [id number] MISC Check blood sugar twice daily and as directed. Dx E11.65  . pioglitazone (ACTOS) 30 MG tablet Take 1 tablet (30 mg total) by mouth daily.  . polyethylene glycol powder (GLYCOLAX/MIRALAX) 17 GM/SCOOP powder Take 17 g by mouth daily.  Marland Kitchen venlafaxine XR (EFFEXOR XR) 37.5 MG 24 hr capsule Take one capsule every other day for two days and than one capsule every three days for week and stop.  Marland Kitchen venlafaxine XR (EFFEXOR-XR) 150 MG 24 hr capsule Take 1 capsule (150 mg total) by mouth daily.  . [DISCONTINUED] buPROPion (WELLBUTRIN SR) 150 MG 12 hr tablet Take 1 tablet (150 mg total) by mouth 2 (two) times daily.  . [DISCONTINUED] Venlafaxine HCl 225 MG TB24 Take 1 tablet (225 mg total) by mouth daily.  Facility-Administered Encounter Medications as of 09/28/2019  Medication  . diclofenac sodium (VOLTAREN) 1 % transdermal gel 2 g    Recent Results (from the past 2160 hour(s))  Comprehensive metabolic panel     Status: None   Collection Time: 08/01/19 12:45 PM  Result Value Ref Range   Glucose 94 65 - 99 mg/dL   BUN 15 6 - 24 mg/dL   Creatinine, Ser 0.86 0.57 - 1.00 mg/dL   GFR calc non Af Amer 80 >59 mL/min/1.73   GFR calc Af Amer 92 >59 mL/min/1.73   BUN/Creatinine Ratio 17 9 - 23   Sodium 140 134 - 144 mmol/L   Potassium 4.5 3.5 - 5.2 mmol/L   Chloride 98 96 - 106 mmol/L   CO2 26 20 - 29 mmol/L   Calcium 9.2 8.7 - 10.2 mg/dL   Total Protein 6.6 6.0 - 8.5 g/dL   Albumin 4.4 3.8 - 4.8 g/dL   Globulin, Total 2.2 1.5 - 4.5 g/dL   Albumin/Globulin Ratio 2.0 1.2 - 2.2   Bilirubin Total 0.3 0.0 - 1.2 mg/dL   Alkaline Phosphatase 111 39 - 117 IU/L   AST 17 0 - 40 IU/L   ALT 19 0 - 32 IU/L  CBC with Differential/Platelet     Status: Abnormal   Collection  Time: 08/01/19 12:45 PM  Result Value Ref Range   WBC 14.6 (H) 3.4 - 10.8 x10E3/uL   RBC 4.77 3.77 - 5.28 x10E6/uL   Hemoglobin 14.2 11.1 - 15.9 g/dL   Hematocrit 42.0 34.0 - 46.6 %   MCV 88 79 - 97 fL   MCH 29.8 26.6 - 33.0 pg   MCHC 33.8 31.5 - 35.7 g/dL   RDW 13.0 11.7 - 15.4 %   Platelets 357 150 - 450 x10E3/uL   Neutrophils 70 Not Estab. %   Lymphs 22 Not Estab. %   Monocytes 4 Not Estab. %   Eos 2 Not Estab. %   Basos 0 Not Estab. %   Neutrophils Absolute 10.4 (H) 1.4 - 7.0 x10E3/uL   Lymphocytes Absolute 3.2 (H) 0.7 - 3.1 x10E3/uL   Monocytes Absolute 0.6 0.1 - 0.9 x10E3/uL   EOS (ABSOLUTE) 0.3 0.0 - 0.4 x10E3/uL   Basophils Absolute 0.1 0.0 - 0.2 x10E3/uL   Immature Granulocytes 2 Not Estab. %   Immature Grans (Abs) 0.2 (H) 0.0 - 0.1 x10E3/uL    Comment: (An elevated percentage of Immature Granulocytes has not been found to be clinically significant as a sole clinical predictor of disease. Does NOT include bands or blast cells.  Pregnancy associated physiological leukocytosis may also show increased immature granulocytes without clinical significance.)   Vitamin D (25 hydroxy)     Status: None   Collection Time: 08/01/19 12:45 PM  Result Value Ref Range   Vit D, 25-Hydroxy 53.8 30.0 - 100.0 ng/mL    Comment: Vitamin D deficiency has been defined by the [place] practice guideline as a level of serum 25-OH vitamin D less than 20 ng/mL (1,2). The Endocrine Society went on to further define vitamin D insufficiency as a level between 21 and 29 ng/mL (2). 1. IOM (Institute of Medicine). 2010. Dietary reference    intakes for calcium and D. [place]: The    [employer]. 2. Holick MF, Binkley [place], Bischoff-Ferrari HA, et al.    Evaluation, treatment, and prevention of vitamin D    deficiency: an Endocrine Society clinical practice    guideline. JCEM.  2011 Jul; 96(7):1911-30.   Hemoglobin A1c     Status: Abnormal    Collection Time: 08/01/19 12:45 PM  Result Value Ref Range   Hgb A1c MFr Bld 6.2 (H) 4.8 - 5.6 %    Comment:          Prediabetes: 5.7 - 6.4          Diabetes: >6.4          Glycemic control for adults with diabetes: <7.0    Est. average glucose Bld gHb Est-mCnc 131 mg/dL  CBC with Differential/Platelet     Status: Abnormal   Collection Time: 08/08/19  9:51 AM  Result Value Ref Range   WBC 13.7 (H) 4.0 - 10.5 K/uL   RBC 4.24 3.87 - 5.11 Mil/uL   Hemoglobin 12.9 12.0 - 15.0 g/dL   HCT 38.8 36.0 - 46.0 %   MCV 91.5 78.0 - 100.0 fl   MCHC 33.2 30.0 - 36.0 g/dL   RDW 14.5 11.5 - 15.5 %   Platelets 306.0 150.0 - 400.0 K/uL   Neutrophils Relative % 72.9 43.0 - 77.0 %   Lymphocytes Relative 19.7 12.0 - 46.0 %   Monocytes Relative 4.6 3.0 - 12.0 %   Eosinophils Relative 2.2 0.0 - 5.0 %   Basophils Relative 0.6 0.0 - 3.0 %   Neutro Abs 10.0 (H) 1.4 - 7.7 K/uL   Lymphs Abs 2.7 0.7 - 4.0 K/uL   Monocytes Absolute 0.6 0.1 - 1.0 K/uL   Eosinophils Absolute 0.3 0.0 - 0.7 K/uL   Basophils Absolute 0.1 0.0 - 0.1 K/uL      Constitutional:  There were no vitals taken for this visit.   Musculoskeletal: Strength & Muscle Tone: within normal limits Gait & Station: normal Patient leans: N/A  Psychiatric Specialty Exam: Physical Exam  ROS  There were no vitals taken for this visit.There is no height or weight on file to calculate BMI.  General Appearance: Casual  Eye Contact:  Fair  Speech:  Normal Rate  Volume:  Normal  Mood:  Anxious, Dysphoric and Emotional  Affect:  Congruent  Thought Process:  Descriptions of Associations: Intact  Orientation:  Full (Time, Place, and Person)  Thought Content:  Rumination  Suicidal Thoughts:  No  Homicidal Thoughts:  No  Memory:  Immediate;   Good Recent;   Good Remote;   Good  Judgement:  Intact  Insight:  Present  Psychomotor Activity:  Increased  Concentration:  Concentration: Fair and Attention Span: Fair  Recall:  [place] of  Knowledge:  Good  Language:  Good  Akathisia:  No  Handed:  Right  AIMS (if indicated):     Assets:  Communication Skills Desire for Improvement Housing Resilience  ADL's:  Intact  Cognition:  WNL  Sleep:   fair     Assessment and Plan: Major depressive disorder, recurrent.  Anxiety.  I reviewed her psychosocial stressors, current medication, blood work and records from other provider.  Patient is taking a moderate dose of antidepressant but continues to experience crying spells, depression, emotional.  She wanted to come off from Effexor and like to try a different medication.  I agree since Effexor is no longer working.  I recommend to cut down Effexor very slowly.  We will switch from tablet to capsule to help her withdrawal symptoms.  She will take Effexor XR 150 mg along with 37.5 mg 1 capsule every other day and then every third day and then she will stop  after week.  She will continue Effexor XR 150 mg until next appointment.  I will also change her Wellbutrin to extended release to XL 300 mg in the morning.  We will try Lamictal 25 mg daily for 1 week and then 50 mg daily.  Discussed medication side effects.  Eventually we will take her off from Effexor slowly.  I also offer therapy again patient is interested to do spiritual therapy and she want to try first.  However she agreed if things did not work out then she will call us back.  Discussed safety concerns and anytime having active suicidal thoughts or homicidal thought then she need to call [phone] one of the local emergency room.  Follow-up in 3 weeks.  Follow Up Instructions:    I discussed the assessment and treatment plan with the patient. The patient was provided an opportunity to ask questions and all were answered. The patient agreed with the plan and demonstrated an understanding of the instructions.   The patient was advised to call back or seek an in-person evaluation if the symptoms worsen or if the condition fails to  improve as anticipated.  I provided 55 minutes of non-face-to-face time during this encounter.   Kathlee Nations, MD

## 2019-09-30 ENCOUNTER — Encounter (INDEPENDENT_AMBULATORY_CARE_PROVIDER_SITE_OTHER): Payer: Self-pay | Admitting: Physician Assistant

## 2019-09-30 ENCOUNTER — Ambulatory Visit (INDEPENDENT_AMBULATORY_CARE_PROVIDER_SITE_OTHER): Payer: BC Managed Care – PPO | Admitting: Physician Assistant

## 2019-09-30 ENCOUNTER — Other Ambulatory Visit: Payer: Self-pay

## 2019-09-30 VITALS — BP 106/68 | HR 89 | Temp 98.3°F | Ht 64.0 in | Wt 277.0 lb

## 2019-09-30 DIAGNOSIS — E119 Type 2 diabetes mellitus without complications: Secondary | ICD-10-CM | POA: Diagnosis not present

## 2019-09-30 DIAGNOSIS — G4733 Obstructive sleep apnea (adult) (pediatric): Secondary | ICD-10-CM

## 2019-09-30 DIAGNOSIS — Z6841 Body Mass Index (BMI) 40.0 and over, adult: Secondary | ICD-10-CM | POA: Diagnosis not present

## 2019-09-30 MED ORDER — VICTOZA 18 MG/3ML ~~LOC~~ SOPN: 1.2000 mg | PEN_INJECTOR | Freq: Every day | SUBCUTANEOUS | 0 refills | Status: DC

## 2019-10-02 NOTE — Progress Notes (Signed)
Chief Complaint:   [name] is here to discuss her progress with her obesity treatment plan along with follow-up of her obesity related diagnoses. Lisa Curry is on the Category 3 Plan and states she is following her eating plan approximately 0% of the time. Lisa Curry states she is exercising 0 minutes 0 times per week.  Today's visit was #: 18 Starting weight: 279 lbs Starting date: 07/26/2018 Today's weight: 277 lbs Today's date: 09/30/2019 Total lbs lost to date: 2 Total lbs lost since last in-office visit: 0  Interim History: Lisa Curry reports that she has been following the "see food" plan. She continues to have sweet foods in the house. She is also complaining of back pain. Lisa Curry continues to care for her grandmother.  Subjective:   Type 2 diabetes mellitus Lisa Curry is on Victoza and Actos. Her last A1c was at 6.2 (08/01/19). Her fasting BGs range between 122 and 167. She denies cravings and she has no hypoglycemia.  OSA (obstructive sleep apnea) Lisa Curry started CPAP last week. She will follow up with neurology in three months.  Assessment/Plan:   Type 2 diabetes mellitus   Lisa Curry has been given diabetes education by myself today. Good blood sugar control is important to decrease the likelihood of diabetic complications such as nephropathy, neuropathy, limb loss, blindness, coronary artery disease, and death. Intensive lifestyle modification including diet, exercise and weight loss were discussed as the first line treatment for diabetes. Lisa Curry agrees to continue Victoza and to increase the dose to 1.2 mg sub Q daily #3 pens with no refills.  OSA (obstructive sleep apnea) Intensive lifestyle modifications are the first line treatment for this issue. We discussed several lifestyle modifications today and she will continue to work on diet, exercise and weight loss efforts. We will continue to monitor. Lisa Curry will continue with CPAP. Orders and follow up as documented in patient record.    Counseling  Sleep apnea is a condition in which breathing pauses or becomes shallow during sleep. This happens over and over during the night. This disrupts your sleep and keeps your body from getting the rest that it needs, which can cause tiredness and lack of energy (fatigue) during the day.  Sleep apnea treatment: If you were given a device to open your airway while you sleep, USE IT!  Sleep hygiene:   Limit or avoid alcohol, caffeinated beverages, and cigarettes, especially close to bedtime.   Do not eat a large meal or eat spicy foods right before bedtime. This can lead to digestive discomfort that can make it hard for you to sleep.  Keep a sleep diary to help you and your health care provider figure out what could be causing your insomnia.  . Make your bedroom a dark, comfortable place where it is easy to fall asleep. ? Put up shades or blackout curtains to block light from outside. ? Use a white noise machine to block noise. ? Keep the temperature cool. . Limit screen use before bedtime. This includes: ? Watching TV. ? Using your smart phone, tablet, or computer. . Stick to a routine that includes going to bed and waking up at the same times every day and night. This can help you fall asleep faster. Consider making a quiet activity, such as reading, part of your nighttime routine. . Try to avoid taking naps during the day so that you sleep better at night. . Get out of bed if you are still awake after 15 minutes of trying to  sleep. Keep the lights down, but try reading or doing a quiet activity. When you feel sleepy, go back to bed.  Obesity Lisa Curry is currently in the action stage of change. As such, her goal is to continue with weight loss efforts. She has agreed to on the Category 2 Plan.   We discussed the following exercise goals today: For substantial health benefits, adults should do at least 150 minutes (2 hours and 30 minutes) a week of moderate-intensity, or 75 minutes  (1 hour and 15 minutes) a week of vigorous-intensity aerobic physical activity, or an equivalent combination of moderate- and vigorous-intensity aerobic activity. Aerobic activity should be performed in episodes of at least 10 minutes, and preferably, it should be spread throughout the week. Adults should also include muscle-strengthening activities that involve all major muscle groups on 2 or more days a week.  We discussed the following behavioral modification strategies today: decreasing simple carbohydrates and meal planning and cooking strategies.  Lisa Curry has agreed to follow-up with our clinic in 2 weeks. She was informed of the importance of frequent follow-up visits to maximize her success with intensive lifestyle modifications for her multiple health conditions.    Objective:   Blood pressure 106/68, pulse 89, temperature 98.3 F (36.8 C), temperature source Oral, height 5\' 4"  (1.626 m), weight 277 lb (125.6 kg), last menstrual period 08/15/2019, SpO2 99 %. Body mass index is 47.55 kg/m.  General: Cooperative, alert, well developed, in no acute distress. HEENT: Conjunctivae and lids unremarkable. Neck: No thyromegaly.  Cardiovascular: Regular rhythm.  Lungs: Normal work of breathing. Extremities: No edema.  Neurologic: No focal deficits.   Lab Results  Component Value Date   CREATININE 0.86 08/01/2019   BUN 15 08/01/2019   NA 140 08/01/2019   K 4.5 08/01/2019   CL 98 08/01/2019   CO2 26 08/01/2019   Lab Results  Component Value Date   ALT 19 08/01/2019   AST 17 08/01/2019   ALKPHOS 111 08/01/2019   BILITOT 0.3 08/01/2019   Lab Results  Component Value Date   HGBA1C 6.2 (H) 08/01/2019   HGBA1C 5.9 (H) 04/22/2019   HGBA1C 5.9 (H) 11/28/2018   HGBA1C 5.6 08/24/2018   HGBA1C 6.4 05/16/2018   Lab Results  Component Value Date   INSULIN 9.8 11/28/2018   INSULIN 20.0 07/26/2018   Lab Results  Component Value Date   TSH 2.42 05/16/2018   Lab Results   Component Value Date   CHOL 133 11/28/2018   HDL 46 11/28/2018   LDLCALC 60 11/28/2018   LDLDIRECT 73.0 04/18/2017   TRIG 136 11/28/2018   CHOLHDL 3 05/16/2018   Lab Results  Component Value Date   WBC 13.7 (H) 08/08/2019   HGB 12.9 08/08/2019   HCT 38.8 08/08/2019   MCV 91.5 08/08/2019   PLT 306.0 08/08/2019   Lab Results  Component Value Date   IRON 62 05/30/2016   FERRITIN 55.2 05/30/2016    Ref. Range 08/01/2019 12:45  Vitamin D, 25-Hydroxy Latest Ref Range: 30.0 - 100.0 ng/mL 53.8    Attestation Statements:   Reviewed by clinician on day of visit: allergies, medications, problem list, medical history, surgical history, family history, social history, and previous encounter notes.  Time spent on visit including pre-visit chart review and post-visit care was 33 minutes.   Corey Skains, am acting as Location manager for [employer], PA-C.  I have reviewed the above documentation for accuracy and completeness, and I agree with the above. -  Abby Potash, PA-C

## 2019-10-07 ENCOUNTER — Encounter (INDEPENDENT_AMBULATORY_CARE_PROVIDER_SITE_OTHER): Payer: Self-pay | Admitting: Physician Assistant

## 2019-10-07 ENCOUNTER — Other Ambulatory Visit (INDEPENDENT_AMBULATORY_CARE_PROVIDER_SITE_OTHER): Payer: Self-pay

## 2019-10-07 DIAGNOSIS — E119 Type 2 diabetes mellitus without complications: Secondary | ICD-10-CM

## 2019-10-07 MED ORDER — VICTOZA 18 MG/3ML ~~LOC~~ SOPN: 1.2000 mg | PEN_INJECTOR | Freq: Every day | SUBCUTANEOUS | 0 refills | Status: DC

## 2019-10-07 NOTE — Telephone Encounter (Signed)
Please advise 

## 2019-10-07 NOTE — Telephone Encounter (Signed)
Ok to send as 90 day

## 2019-10-10 ENCOUNTER — Encounter: Payer: Self-pay | Admitting: Neurology

## 2019-10-14 ENCOUNTER — Other Ambulatory Visit: Payer: Self-pay

## 2019-10-14 ENCOUNTER — Ambulatory Visit (INDEPENDENT_AMBULATORY_CARE_PROVIDER_SITE_OTHER): Payer: BC Managed Care – PPO | Admitting: Family Medicine

## 2019-10-14 ENCOUNTER — Encounter (INDEPENDENT_AMBULATORY_CARE_PROVIDER_SITE_OTHER): Payer: Self-pay | Admitting: Family Medicine

## 2019-10-14 VITALS — BP 131/76 | HR 81 | Temp 98.0°F | Ht 64.0 in | Wt 275.0 lb

## 2019-10-14 DIAGNOSIS — Z9189 Other specified personal risk factors, not elsewhere classified: Secondary | ICD-10-CM

## 2019-10-14 DIAGNOSIS — E119 Type 2 diabetes mellitus without complications: Secondary | ICD-10-CM | POA: Diagnosis not present

## 2019-10-14 DIAGNOSIS — K5909 Other constipation: Secondary | ICD-10-CM

## 2019-10-14 DIAGNOSIS — Z6841 Body Mass Index (BMI) 40.0 and over, adult: Secondary | ICD-10-CM

## 2019-10-14 MED ORDER — LINACLOTIDE 145 MCG PO CAPS: 145.0000 ug | ORAL_CAPSULE | Freq: Every day | ORAL | 0 refills | Status: DC

## 2019-10-14 NOTE — Progress Notes (Signed)
Chief Complaint:   Lisa Curry is here to discuss her progress with her obesity treatment plan along with follow-up of her obesity related diagnoses. Lisa Curry is on the Category 3 Plan and states she is following her eating plan approximately 40% of the time. Lisa Curry states she is exercising 0 minutes 0 times per week.  Today's visit was #: 41 Starting weight: 279 lbs Starting date: 07/26/2018 Today's weight: 275 lbs Today's date: 10/14/2019 Total lbs lost to date: 4 Total lbs lost since last in-office visit: 2  Interim History: Lisa Curry continues to do well with weight loss, but is struggling to follow her plan closely. She notes increased p.m. cravings and some family sabotage, which makes things more difficult for her.  Subjective:   Type 2 diabetes mellitus without complication, without long-term current use of insulin ([place]). Lisa Curry states fasting blood sugars range between 113 and 166. She is working on diet and weight loss and is stable on Victoza. She feels her blood sugar elevation is due to some late p.m. snacking.  Lab Results  Component Value Date   HGBA1C 6.2 (H) 08/01/2019   HGBA1C 5.9 (H) 04/22/2019   HGBA1C 5.9 (H) 11/28/2018   Lab Results  Component Value Date   MICROALBUR <0.7 05/16/2018   LDLCALC 60 11/28/2018   CREATININE 0.86 08/01/2019   Lab Results  Component Value Date   INSULIN 9.8 11/28/2018   INSULIN 20.0 07/26/2018   Chronic constipation. Lisa Curry is stable on Linzess. She requests a 90-day refill for insurance, but her PCP filled it 1 month at a time.  At risk for nausea. Lisa Curry is at risk for nausea due to Victoza. She is to make sure to take with first meal of the day.  Assessment/Plan:   Type 2 diabetes mellitus without complication, without long-term current use of insulin ([place]). Good blood sugar control is important to decrease the likelihood of diabetic complications such as nephropathy, neuropathy, limb loss, blindness, coronary artery  disease, and death. Intensive lifestyle modification including diet, exercise and weight loss are the first line of treatment for diabetes. Lisa Curry will continue Victoza and weight loss efforts and will work on decreasing carbs in late p.m. snacks.  Chronic constipation. Lisa Curry was informed that a decrease in bowel movement frequency is normal while losing weight, but stools should not be hard or painful. Orders and follow up as documented in patient record. Refill was given for linaclotide (LINZESS) 145 MCG CAPS capsule, 90 day supply. She is to follow-up with her PCP.  Counseling Getting to Good Bowel Health: Your goal is to have one soft bowel movement each day. Drink at least 8 glasses of water each day. Eat plenty of fiber (goal is over 25 grams each day). It is best to get most of your fiber from dietary sources which includes leafy green vegetables, fresh fruit, and whole grains. You may need to add fiber with the help of OTC fiber supplements. These include Metamucil, Citrucel, and Flaxseed. If you are still having trouble, try adding Miralax or Magnesium Citrate. If all of these changes do not work, Cabin crew.   At risk for nausea. Lisa Curry was given approximately 15 minutes of nausea prevention counseling today. Lisa Curry is at risk for nausea due to her new or current medication. She was encouraged to titrate her medication slowly, make sure to stay hydrated, eat smaller portions throughout the day, and avoid high fat meals.   Class 3 severe obesity with  serious comorbidity and body mass index (BMI) of 45.0 to 49.9 in adult, unspecified obesity type ([place]).  Lisa Curry is currently in the action stage of change. As such, her goal is to continue with weight loss efforts. She has agreed to the Category 3 Plan or journal 1500 calories and 85+ grams of protein.  Behavioral modification strategies: increasing lean protein intake and dealing with family or coworker sabotage.  Lisa Curry has agreed to  follow-up with our clinic in 2 weeks. She was informed of the importance of frequent follow-up visits to maximize her success with intensive lifestyle modifications for her multiple health conditions.   Objective:   Blood pressure 131/76, pulse 81, temperature 98 F (36.7 C), temperature source Oral, height 5\' 4"  (1.626 m), weight 275 lb (124.7 kg), last menstrual period 08/15/2019, SpO2 99 %. Body mass index is 47.2 kg/m.  General: Cooperative, alert, well developed, in no acute distress. HEENT: Conjunctivae and lids unremarkable. Cardiovascular: Regular rhythm.  Lungs: Normal work of breathing. Neurologic: No focal deficits.   Lab Results  Component Value Date   CREATININE 0.86 08/01/2019   BUN 15 08/01/2019   NA 140 08/01/2019   K 4.5 08/01/2019   CL 98 08/01/2019   CO2 26 08/01/2019   Lab Results  Component Value Date   ALT 19 08/01/2019   AST 17 08/01/2019   ALKPHOS 111 08/01/2019   BILITOT 0.3 08/01/2019   Lab Results  Component Value Date   HGBA1C 6.2 (H) 08/01/2019   HGBA1C 5.9 (H) 04/22/2019   HGBA1C 5.9 (H) 11/28/2018   HGBA1C 5.6 08/24/2018   HGBA1C 6.4 05/16/2018   Lab Results  Component Value Date   INSULIN 9.8 11/28/2018   INSULIN 20.0 07/26/2018   Lab Results  Component Value Date   TSH 2.42 05/16/2018   Lab Results  Component Value Date   CHOL 133 11/28/2018   HDL 46 11/28/2018   LDLCALC 60 11/28/2018   LDLDIRECT 73.0 04/18/2017   TRIG 136 11/28/2018   CHOLHDL 3 05/16/2018   Lab Results  Component Value Date   WBC 13.7 (H) 08/08/2019   HGB 12.9 08/08/2019   HCT 38.8 08/08/2019   MCV 91.5 08/08/2019   PLT 306.0 08/08/2019   Lab Results  Component Value Date   IRON 62 05/30/2016   FERRITIN 55.2 05/30/2016   Attestation Statements:   Reviewed by clinician on day of visit: allergies, medications, problem list, medical history, surgical history, family history, social history, and previous encounter notes.  I, Michaelene Song, am acting  as Location manager for Dennard Nip, MD   I have reviewed the above documentation for accuracy and completeness, and I agree with the above. -  Dennard Nip, MD

## 2019-10-16 ENCOUNTER — Ambulatory Visit (INDEPENDENT_AMBULATORY_CARE_PROVIDER_SITE_OTHER): Payer: BC Managed Care – PPO | Admitting: Psychiatry

## 2019-10-16 ENCOUNTER — Encounter (HOSPITAL_COMMUNITY): Payer: Self-pay | Admitting: Psychiatry

## 2019-10-16 ENCOUNTER — Other Ambulatory Visit: Payer: Self-pay

## 2019-10-16 DIAGNOSIS — F419 Anxiety disorder, unspecified: Secondary | ICD-10-CM | POA: Diagnosis not present

## 2019-10-16 DIAGNOSIS — F331 Major depressive disorder, recurrent, moderate: Secondary | ICD-10-CM | POA: Diagnosis not present

## 2019-10-16 MED ORDER — BUPROPION HCL ER (XL) 300 MG PO TB24: 300.0000 mg | ORAL_TABLET | ORAL | 1 refills | Status: DC

## 2019-10-16 MED ORDER — VENLAFAXINE HCL ER 37.5 MG PO CP24: ORAL_CAPSULE | ORAL | 1 refills | Status: DC

## 2019-10-16 MED ORDER — LAMOTRIGINE 25 MG PO TABS: ORAL_TABLET | ORAL | 1 refills | Status: DC

## 2019-10-16 NOTE — Progress Notes (Signed)
Virtual Visit via Video Note  I connected with Lisa Curry on 10/16/19 at 11:00 AM EST by a video enabled telemedicine application and verified that I am speaking with the correct person using two identifiers.   I discussed the limitations of evaluation and management by telemedicine and the availability of in person appointments. The patient expressed understanding and agreed to proceed.  History of Present Illness: Patient was evaluated by video session.  She is a 49 year old Caucasian female who was seen first time 3 weeks ago..  Having a lot of symptoms depression, crying spells, feeling hopelessness, worthlessness and poor sleep.  She was getting antidepressant from PCP.  At that time she was getting venlafaxine 225 mg daily and Wellbutrin SR 150 mg twice a day.  Despite taking 2 antidepressant he continued to have above symptoms.  Her biggest stressor is taking care of grandmother who is 7 year old.  Her mother who is 106 year old does not involve with patient's grandmother.  I have recommended to try cutting down the Effexor slowly and gradually since she recall having withdrawal symptoms coming from Effexor in the past.  She is now taking venlafaxine 150 mg along with 37.5 mg and we also change Wellbutrin to XL to take 300 mg in the morning.  I have recommended that she can stop 37.5 mg Effexor after week but she forgot.  We also started her on on Lamictal and she noticed improvement in her mood, she does not have as many times else.  She is sleeping better.  She also does not feel hopeless or worthless.  She has no rash, itching, tremors or shakes.  She reported her appetite is okay.  Her energy level is somewhat better.  However she still have ruminative thoughts and sometimes and I do not.  She is been taking Effexor for 10 years.  She denies drinking or using any illegal substances.  She lives with her 2 children and her husband.  Currently patient is not working but used to work as a Pensions consultant but due to [employer] she has not able to go back to work.  Past Psychiatric History: H/O seeing psychiatrist on and off for many years.  Saw Dr. Letta Moynahan, Dr. Roland Earl and then Dr. Tiajuana Amass.  Lately PCP prescribed her meds. Tried Celexa, Zoloft and Paxil which caused excessive sleep. Good response with Zoloft.     Psychiatric Specialty Exam: Physical Exam  Review of Systems  There were no vitals taken for this visit.There is no height or weight on file to calculate BMI.  General Appearance: Casual  Eye Contact:  Fair  Speech:  Clear and Coherent and Normal Rate  Volume:  Normal  Mood:  Anxious  Affect:  Congruent  Thought Process:  Goal Directed  Orientation:  Full (Time, Place, and Person)  Thought Content:  Rumination  Suicidal Thoughts:  No  Homicidal Thoughts:  No  Memory:  Immediate;   Good Recent;   Good Remote;   Good  Judgement:  Good  Insight:  Present  Psychomotor Activity:  NA  Concentration:  Concentration: Good and Attention Span: Good  Recall:  Good  Fund of Knowledge:  Good  Language:  Good  Akathisia:  No  Handed:  Right  AIMS (if indicated):     Assets:  Communication Skills Desire for Improvement Housing Resilience Social Support Transportation  ADL's:  Intact  Cognition:  WNL  Sleep:   good most of the time      Assessment and Plan:  Major depressive disorder, recurrent.  Anxiety.  Discussed medication side effects and efficacy.  Recommend to slowly cutting down the venlafaxine.  She has to stop at 37.5 mg after a week.  I recommended that she should write it down the instruction.  Now she will take venlafaxine 150 mg daily for 1 week.  She will not take 37.5 mg.  After a week she will take venlafaxine 112.5 daily.  She will also start taking Lamictal 75 mg daily unless patient has a rash and in back case she need to call us and stop the lamotrigine immediately.  She will continue Wellbutrin XL 300 mg daily.  She liked XL better than  SR.  Patient has not therapy as she preferred to schedule therapist and looking for one.  So far she is not having any side effects.  She does not have suicidal thoughts.  I encourage healthy lifestyle and watch her calorie intake.  She has diabetes.  I also recommend if she has difficulty finding therapist then she should call us immediately.  Treatment plan discussed with the patient in detail.  She agreed with the plan and also had instruction written down.  Follow-up in 4 weeks.  I recommended to call us back if she has any time spent 30 minutes.    Follow Up Instructions:    I discussed the assessment and treatment plan with the patient. The patient was provided an opportunity to ask questions and all were answered. The patient agreed with the plan and demonstrated an understanding of the instructions.   The patient was advised to call back or seek an in-person evaluation if the symptoms worsen or if the condition fails to improve as anticipated.  I provided 30 minutes of non-face-to-face time during this encounter.   Kathlee Nations, MD

## 2019-10-17 ENCOUNTER — Other Ambulatory Visit: Payer: Self-pay | Admitting: Family Medicine

## 2019-10-17 NOTE — Telephone Encounter (Signed)
Name of Medication: Clonazepam Name of Pharmacy: [facility] Last Fill or Written Date and Quantity: 09/02/19, #60 Last Office Visit and Type: 09/18/19, CPE Next Office Visit and Type: none Last Controlled Substance Agreement Date: 04/24/17 Last UDS: 10/16/14

## 2019-10-18 ENCOUNTER — Telehealth (HOSPITAL_COMMUNITY): Payer: Self-pay

## 2019-10-18 ENCOUNTER — Telehealth (HOSPITAL_COMMUNITY): Payer: Self-pay | Admitting: *Deleted

## 2019-10-18 DIAGNOSIS — F419 Anxiety disorder, unspecified: Secondary | ICD-10-CM

## 2019-10-18 DIAGNOSIS — F331 Major depressive disorder, recurrent, moderate: Secondary | ICD-10-CM

## 2019-10-18 MED ORDER — LAMOTRIGINE 25 MG PO TABS: 75.0000 mg | ORAL_TABLET | Freq: Every day | ORAL | 1 refills | Status: DC

## 2019-10-18 MED ORDER — CLONAZEPAM 0.5 MG PO TABS: ORAL_TABLET | ORAL | 1 refills | Status: DC

## 2019-10-18 NOTE — Telephone Encounter (Signed)
Medication problem - Telephone call from pt with concern her pharmacy was not able to fill her new Lamictal order due to confusion if 25 mg for 2 or 3 tablets daily.  Verified with Dr. Adele Schilder 3 a day and new [facility] per Dr. Adele Schilder authorization. [facility] [place] to verify they had received new order for verified dosage of Lamictal 25 mg, 3 a day (75mg  total), #90 with 1 refill.  Then called patient to inform this dosage had been verified with Dr. Adele Schilder and then sent into her pharmacy who was now filling the medication ad ordered.  Patient agreed to contact our office if any rash or side effects with titration up on her Lamictal.  Patient to keep appointment  11/13/19 for further evaluation if she would need to go up further then.

## 2019-10-18 NOTE — Telephone Encounter (Signed)
Writer has received two phone calls from pt this morning regarding medication questions. First call asking about the Klonopin and why it was not discussed on visit 10/16/19. Writer did not get a definitve answer as to what superficially pt was asking. She did acknowledge she was due for refill. Pt called a second time to ask about Effexor dosage and how to take. Writer read SIG to pt and stated that I am showing RX was reeived by pharmacy on 10/16/19.

## 2019-10-18 NOTE — Telephone Encounter (Signed)
ERx 

## 2019-10-18 NOTE — Telephone Encounter (Signed)
I returned patient's phone call.  Her Klonopin was given by PCP but now she wants her Klonopin to be given by this Probation officer.  We will give refill on Klonopin to take 0.5 mg daily and second if needed.  I explained once we have therapeutic dose of lamotrigine then we will consider cutting down the Klonopin and she agreed with that.  1 more time all medication dosage and directions were given to the patient and she acknowledged.

## 2019-10-22 ENCOUNTER — Telehealth (HOSPITAL_COMMUNITY): Payer: Self-pay | Admitting: *Deleted

## 2019-10-22 NOTE — Telephone Encounter (Signed)
Yes

## 2019-10-22 NOTE — Telephone Encounter (Signed)
Pt called stating that insurance will not cover Effexor XR 37.5mg  taking 3 qd for a total dose of 112.5mg . Can I send in new RX for one 75mg  and one 37.5mg  qd? Which insurance will cover. Please review and advise.

## 2019-10-23 ENCOUNTER — Other Ambulatory Visit (HOSPITAL_COMMUNITY): Payer: Self-pay | Admitting: *Deleted

## 2019-10-23 DIAGNOSIS — F331 Major depressive disorder, recurrent, moderate: Secondary | ICD-10-CM

## 2019-10-23 DIAGNOSIS — F419 Anxiety disorder, unspecified: Secondary | ICD-10-CM

## 2019-10-23 MED ORDER — VENLAFAXINE HCL ER 75 MG PO CP24: 75.0000 mg | ORAL_CAPSULE | Freq: Every day | ORAL | 1 refills | Status: DC

## 2019-10-23 MED ORDER — VENLAFAXINE HCL ER 37.5 MG PO CP24: ORAL_CAPSULE | ORAL | 1 refills | Status: DC

## 2019-10-24 ENCOUNTER — Telehealth (HOSPITAL_COMMUNITY): Payer: Self-pay | Admitting: *Deleted

## 2019-10-24 NOTE — Telephone Encounter (Signed)
Yes - please do - thanks

## 2019-10-24 NOTE — Telephone Encounter (Signed)
Pt received a call from pt stating that insurance will not fill Lamictal 25mg  for a total dose of 75mg  qd. Essentially the same issue she had with the Effexor. So I can change to one 25mg  and one 75mg  if that's ok.

## 2019-10-25 ENCOUNTER — Encounter: Payer: Self-pay | Admitting: Adult Health

## 2019-10-25 ENCOUNTER — Other Ambulatory Visit (HOSPITAL_COMMUNITY): Payer: Self-pay | Admitting: *Deleted

## 2019-10-25 DIAGNOSIS — F411 Generalized anxiety disorder: Secondary | ICD-10-CM

## 2019-10-25 DIAGNOSIS — F331 Major depressive disorder, recurrent, moderate: Secondary | ICD-10-CM

## 2019-10-25 MED ORDER — LAMOTRIGINE 150 MG PO TABS: 150.0000 mg | ORAL_TABLET | Freq: Every day | ORAL | 1 refills | Status: DC

## 2019-10-25 MED ORDER — LAMOTRIGINE 25 MG PO TABS: 25.0000 mg | ORAL_TABLET | Freq: Every day | ORAL | 2 refills | Status: DC

## 2019-10-27 DIAGNOSIS — G4733 Obstructive sleep apnea (adult) (pediatric): Secondary | ICD-10-CM | POA: Diagnosis not present

## 2019-10-28 ENCOUNTER — Encounter (INDEPENDENT_AMBULATORY_CARE_PROVIDER_SITE_OTHER): Payer: Self-pay | Admitting: Physician Assistant

## 2019-10-28 ENCOUNTER — Ambulatory Visit (INDEPENDENT_AMBULATORY_CARE_PROVIDER_SITE_OTHER): Payer: BC Managed Care – PPO | Admitting: Physician Assistant

## 2019-10-28 ENCOUNTER — Other Ambulatory Visit: Payer: Self-pay

## 2019-10-28 VITALS — BP 109/66 | HR 89 | Temp 98.0°F | Ht 64.0 in | Wt 274.0 lb

## 2019-10-28 DIAGNOSIS — E119 Type 2 diabetes mellitus without complications: Secondary | ICD-10-CM | POA: Diagnosis not present

## 2019-10-28 DIAGNOSIS — Z9189 Other specified personal risk factors, not elsewhere classified: Secondary | ICD-10-CM | POA: Diagnosis not present

## 2019-10-28 DIAGNOSIS — E559 Vitamin D deficiency, unspecified: Secondary | ICD-10-CM | POA: Diagnosis not present

## 2019-10-28 DIAGNOSIS — Z6841 Body Mass Index (BMI) 40.0 and over, adult: Secondary | ICD-10-CM

## 2019-10-28 MED ORDER — BD PEN NEEDLE NANO 2ND GEN 32G X 4 MM MISC: 1.0000 | Freq: Every day | 0 refills | Status: DC

## 2019-10-28 NOTE — Progress Notes (Signed)
Chief Complaint:   OBESITY Lisa Curry is here to discuss her progress with her obesity treatment plan along with follow-up of her obesity related diagnoses. Lisa Curry is on the Category 3 Plan and states she is following her eating plan approximately 10% of the time. Lisa Curry states she is exercising for 0 minutes 0 times per week.  Today's visit was #: 20 Starting weight: 279 lbs Starting date: 07/26/2018 Today's weight: 274 lbs Today's date: 10/28/2019 Total lbs lost to date: 5 lbs Total lbs lost since last in-office visit: 1 lb  Interim History: Lisa Curry has her daughter on FaceTime during our appointment today.  She feels like her daughter and husband are very critical of her eating.  She is not eating enough protein during the day.  Subjective:   1. Type 2 diabetes mellitus without complication, without long-term current use of insulin ([facility]) Home glucose monitoring is performed regularly and fasting blood sugars range from 106-166.  She is on Victoza and Actos.  No hypoglycemia.  Lab Results  Component Value Date   HGBA1C 6.2 (H) 08/01/2019   HGBA1C 5.9 (H) 04/22/2019   HGBA1C 5.9 (H) 11/28/2018   Lab Results  Component Value Date   MICROALBUR <0.7 05/16/2018   LDLCALC 60 11/28/2018   CREATININE 0.86 08/01/2019   Lab Results  Component Value Date   INSULIN 9.8 11/28/2018   INSULIN 20.0 07/26/2018   2. Vitamin D deficiency Lisa Curry's Vitamin D level was 53.8 on 08/01/2019. She is currently taking vit D. She denies nausea, vomiting or muscle weakness.  3. At risk for heart disease Lisa Curry is at a higher than average risk for cardiovascular disease due to obesity. Reviewed: no chest pain on exertion, no dyspnea on exertion, and no swelling of ankles.  Assessment/Plan:   1. Type 2 diabetes mellitus without complication, without long-term current use of insulin ([facility]) Good blood sugar control is important to decrease the likelihood of diabetic complications such as nephropathy, neuropathy,  limb loss, blindness, coronary artery disease, and death. Intensive lifestyle modification including diet, exercise and weight loss are the first line of treatment for diabetes.   2. Vitamin D deficiency Low Vitamin D level contributes to fatigue and are associated with obesity, breast, and colon cancer. She agrees to continue to take prescription Vitamin D @50 ,000 IU every week and will follow-up for routine testing of Vitamin D, at least 2-3 times per year to avoid over-replacement.  3. At risk for heart disease Lisa Curry was given approximately 15 minutes of coronary artery disease prevention counseling today. She is 49 y.o. female and has risk factors for heart disease including obesity. We discussed intensive lifestyle modifications today with an emphasis on specific weight loss instructions and strategies.   Repetitive spaced learning was employed today to elicit superior memory formation and behavioral change.  4. Class 3 severe obesity with serious comorbidity and body mass index (BMI) of 45.0 to 49.9 in adult, unspecified obesity type [facility]) Lisa Curry is currently in the action stage of change. As such, her goal is to continue with weight loss efforts. She has agreed to the Category 3 Plan.   Exercise goals: For substantial health benefits, adults should do at least 150 minutes (2 hours and 30 minutes) a week of moderate-intensity, or 75 minutes (1 hour and 15 minutes) a week of vigorous-intensity aerobic physical activity, or an equivalent combination of moderate- and vigorous-intensity aerobic activity. Aerobic activity should be performed in episodes of at least 10 minutes, and preferably, it should  be spread throughout the week. Adults should also include muscle-strengthening activities that involve all major muscle groups on 2 or more days a week.  Behavioral modification strategies: meal planning and cooking strategies and dealing with family or coworker sabotage.  Lisa Curry has agreed to follow-up  with our clinic in 2 weeks. She was informed of the importance of frequent follow-up visits to maximize her success with intensive lifestyle modifications for her multiple health conditions.   Objective:   Blood pressure 109/66, pulse 89, temperature 98 F (36.7 C), temperature source Oral, height 5\' 4"  (1.626 m), weight 274 lb (124.3 kg), SpO2 99 %. Body mass index is 47.03 kg/m.  General: Cooperative, alert, well developed, in no acute distress. HEENT: Conjunctivae and lids unremarkable. Cardiovascular: Regular rhythm.  Lungs: Normal work of breathing. Neurologic: No focal deficits.   Lab Results  Component Value Date   CREATININE 0.86 08/01/2019   BUN 15 08/01/2019   NA 140 08/01/2019   K 4.5 08/01/2019   CL 98 08/01/2019   CO2 26 08/01/2019   Lab Results  Component Value Date   ALT 19 08/01/2019   AST 17 08/01/2019   ALKPHOS 111 08/01/2019   BILITOT 0.3 08/01/2019   Lab Results  Component Value Date   HGBA1C 6.2 (H) 08/01/2019   HGBA1C 5.9 (H) 04/22/2019   HGBA1C 5.9 (H) 11/28/2018   HGBA1C 5.6 08/24/2018   HGBA1C 6.4 05/16/2018   Lab Results  Component Value Date   INSULIN 9.8 11/28/2018   INSULIN 20.0 07/26/2018   Lab Results  Component Value Date   TSH 2.42 05/16/2018   Lab Results  Component Value Date   CHOL 133 11/28/2018   HDL 46 11/28/2018   LDLCALC 60 11/28/2018   LDLDIRECT 73.0 04/18/2017   TRIG 136 11/28/2018   CHOLHDL 3 05/16/2018   Lab Results  Component Value Date   WBC 13.7 (H) 08/08/2019   HGB 12.9 08/08/2019   HCT 38.8 08/08/2019   MCV 91.5 08/08/2019   PLT 306.0 08/08/2019   Lab Results  Component Value Date   IRON 62 05/30/2016   FERRITIN 55.2 05/30/2016   Attestation Statements:   Reviewed by clinician on day of visit: allergies, medications, problem list, medical history, surgical history, family history, social history, and previous encounter notes.  I, Water quality scientist, CMA, am acting as Location manager for [employer], PA-C.  I have reviewed the above documentation for accuracy and completeness, and I agree with the above. Abby Potash, PA-C

## 2019-11-11 ENCOUNTER — Ambulatory Visit (INDEPENDENT_AMBULATORY_CARE_PROVIDER_SITE_OTHER): Payer: BC Managed Care – PPO | Admitting: Physician Assistant

## 2019-11-11 ENCOUNTER — Encounter (INDEPENDENT_AMBULATORY_CARE_PROVIDER_SITE_OTHER): Payer: Self-pay | Admitting: Physician Assistant

## 2019-11-11 ENCOUNTER — Other Ambulatory Visit: Payer: Self-pay

## 2019-11-11 VITALS — BP 125/72 | HR 81 | Temp 98.5°F | Ht 64.0 in | Wt 275.0 lb

## 2019-11-11 DIAGNOSIS — Z6841 Body Mass Index (BMI) 40.0 and over, adult: Secondary | ICD-10-CM

## 2019-11-11 DIAGNOSIS — E7849 Other hyperlipidemia: Secondary | ICD-10-CM

## 2019-11-11 NOTE — Progress Notes (Signed)
Chief Complaint:   OBESITY Lisa Curry is here to discuss her progress with her obesity treatment plan along with follow-up of her obesity related diagnoses. Lisa Curry is on the Category 3 Plan, journaling, and Pescatarian and states she is following her eating plan approximately 50% of the time. Lisa Curry states she is exercising for 0 minutes 0 times per week.  Today's visit was #: 21 Starting weight: 279 lbs Starting date: 07/26/2018 Today's weight: 275 lbs Today's date: 11/11/2019 Total lbs lost to date: 4 lbs Total lbs lost since last in-office visit: 0  Interim History: Lisa Curry states that she has been following a mixture of plans and not sticking to one in particular.  She is ready to get back on a structured plan.  Subjective:   1. Other hyperlipidemia Lisa Curry has hyperlipidemia and has been trying to improve her cholesterol levels with intensive lifestyle modification including a low saturated fat diet, exercise and weight loss. She denies any chest pain, claudication or myalgias. She is taking atorvastatin 20 mg daily.  Last lipid panel at goal.  Lab Results  Component Value Date   ALT 19 08/01/2019   AST 17 08/01/2019   ALKPHOS 111 08/01/2019   BILITOT 0.3 08/01/2019   Lab Results  Component Value Date   CHOL 133 11/28/2018   HDL 46 11/28/2018   LDLCALC 60 11/28/2018   LDLDIRECT 73.0 04/18/2017   TRIG 136 11/28/2018   CHOLHDL 3 05/16/2018   Assessment/Plan:   1. Other hyperlipidemia Cardiovascular risk and specific lipid/LDL goals reviewed.  We discussed several lifestyle modifications today and Shawntai will continue to work on diet, exercise and weight loss efforts. Orders and follow up as documented in patient record.   Counseling Intensive lifestyle modifications are the first line treatment for this issue. . Dietary changes: Increase soluble fiber. Decrease simple carbohydrates. . Exercise changes: Moderate to vigorous-intensity aerobic activity 150 minutes per week if  tolerated. . Lipid-lowering medications: see documented in medical record.  2. Class 3 severe obesity with serious comorbidity and body mass index (BMI) of 45.0 to 49.9 in adult, unspecified obesity type [facility]) Lisa Curry is currently in the action stage of change. As such, her goal is to continue with weight loss efforts. She has agreed to the Category 3 Plan.   Exercise goals: For substantial health benefits, adults should do at least 150 minutes (2 hours and 30 minutes) a week of moderate-intensity, or 75 minutes (1 hour and 15 minutes) a week of vigorous-intensity aerobic physical activity, or an equivalent combination of moderate- and vigorous-intensity aerobic activity. Aerobic activity should be performed in episodes of at least 10 minutes, and preferably, it should be spread throughout the week.  Behavioral modification strategies: meal planning and cooking strategies and keeping healthy foods in the home.  Lisa Curry has agreed to follow-up with our clinic in 2 weeks. She was informed of the importance of frequent follow-up visits to maximize her success with intensive lifestyle modifications for her multiple health conditions.   Objective:   Blood pressure 125/72, pulse 81, temperature 98.5 F (36.9 C), temperature source Oral, height 5\' 4"  (1.626 m), weight 275 lb (124.7 kg), last menstrual period 10/24/2019, SpO2 98 %. Body mass index is 47.2 kg/m.  General: Cooperative, alert, well developed, in no acute distress. HEENT: Conjunctivae and lids unremarkable. Cardiovascular: Regular rhythm.  Lungs: Normal work of breathing. Neurologic: No focal deficits.   Lab Results  Component Value Date   CREATININE 0.86 08/01/2019   BUN 15 08/01/2019  NA 140 08/01/2019   K 4.5 08/01/2019   CL 98 08/01/2019   CO2 26 08/01/2019   Lab Results  Component Value Date   ALT 19 08/01/2019   AST 17 08/01/2019   ALKPHOS 111 08/01/2019   BILITOT 0.3 08/01/2019   Lab Results  Component Value Date    HGBA1C 6.2 (H) 08/01/2019   HGBA1C 5.9 (H) 04/22/2019   HGBA1C 5.9 (H) 11/28/2018   HGBA1C 5.6 08/24/2018   HGBA1C 6.4 05/16/2018   Lab Results  Component Value Date   INSULIN 9.8 11/28/2018   INSULIN 20.0 07/26/2018   Lab Results  Component Value Date   TSH 2.42 05/16/2018   Lab Results  Component Value Date   CHOL 133 11/28/2018   HDL 46 11/28/2018   LDLCALC 60 11/28/2018   LDLDIRECT 73.0 04/18/2017   TRIG 136 11/28/2018   CHOLHDL 3 05/16/2018   Lab Results  Component Value Date   WBC 13.7 (H) 08/08/2019   HGB 12.9 08/08/2019   HCT 38.8 08/08/2019   MCV 91.5 08/08/2019   PLT 306.0 08/08/2019   Lab Results  Component Value Date   IRON 62 05/30/2016   FERRITIN 55.2 05/30/2016   Attestation Statements:   Reviewed by clinician on day of visit: allergies, medications, problem list, medical history, surgical history, family history, social history, and previous encounter notes.  Time spent on visit including pre-visit chart review and post-visit care was 30 minutes.   I, Water quality scientist, CMA, am acting as Location manager for [employer], PA-C.  I have reviewed the above documentation for accuracy and completeness, and I agree with the above. Abby Potash, PA-C

## 2019-11-13 ENCOUNTER — Ambulatory Visit (INDEPENDENT_AMBULATORY_CARE_PROVIDER_SITE_OTHER): Payer: BC Managed Care – PPO | Admitting: Psychiatry

## 2019-11-13 ENCOUNTER — Encounter (HOSPITAL_COMMUNITY): Payer: Self-pay | Admitting: Psychiatry

## 2019-11-13 ENCOUNTER — Other Ambulatory Visit: Payer: Self-pay

## 2019-11-13 DIAGNOSIS — F419 Anxiety disorder, unspecified: Secondary | ICD-10-CM

## 2019-11-13 DIAGNOSIS — F331 Major depressive disorder, recurrent, moderate: Secondary | ICD-10-CM

## 2019-11-13 MED ORDER — LAMOTRIGINE 100 MG PO TABS: 100.0000 mg | ORAL_TABLET | Freq: Every day | ORAL | 0 refills | Status: DC

## 2019-11-13 MED ORDER — BUPROPION HCL ER (XL) 300 MG PO TB24: 300.0000 mg | ORAL_TABLET | ORAL | 0 refills | Status: DC

## 2019-11-13 NOTE — Progress Notes (Signed)
Virtual Visit via Telephone Note  I connected with Lisa Curry on 11/13/19 at 11:00 AM EST by telephone and verified that I am speaking with the correct person using two identifiers.   I discussed the limitations, risks, security and privacy concerns of performing an evaluation and management service by telephone and the availability of in person appointments. I also discussed with the patient that there may be a patient responsible charge related to this service. The patient expressed understanding and agreed to proceed.   History of Present Illness: Patient was evaluated by phone session.  She is now taking 75 mg amitriptyline and venlafaxine 112.5.  She also takes Wellbutrin XL 300 mg in the morning.  She reported some improvement since we increase the lamotrigine and she is not as irritable, angry or having any crying spells.  She sleeps most of the time okay.  She is not sure if her 50 year old grandmother may need hospice care and may need a facility care.  So far she is tolerating her medication and reported no side effects.  She has no rash, itching tremors or shakes.  She admitted having a hard time getting her prescription refill because of insurance which requires prior authorization.  She still takes Klonopin 0.5 mg which is given by her PCP but she understand that once we are increasing the lamotrigine to the therapeutic dose she may not need the Klonopin.  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  Now she is thinking to change her career.  She lives with her husband and 2 children.  Patient is okay to further increase the lamotrigine since she noticed improvement.   Past Psychiatric History: H/O seeing psychiatrist on and off for many years. Saw Dr. Letta Moynahan, Dr. Roland Earl and then Dr. Tiajuana Amass. Lately PCP prescribed her meds. Tried Celexa, Zoloft and Paxil which caused excessive sleep. Good response with Zoloft.    Psychiatric Specialty  Exam: Physical Exam  Review of Systems  Last menstrual period 10/24/2019.There is no height or weight on file to calculate BMI.  General Appearance: NA  Eye Contact:  NA  Speech:  Normal Rate  Volume:  Normal  Mood:  Dysphoric and Irritable  Affect:  NA  Thought Process:  Goal Directed  Orientation:  Full (Time, Place, and Person)  Thought Content:  Rumination  Suicidal Thoughts:  No  Homicidal Thoughts:  No  Memory:  Immediate;   Good Recent;   Good Remote;   Good  Judgement:  Intact  Insight:  Good  Psychomotor Activity:  NA  Concentration:  Concentration: Fair and Attention Span: Fair  Recall:  [employer] of Knowledge:  Good  Language:  Good  Akathisia:  No  Handed:  Right  AIMS (if indicated):     Assets:  Communication Skills Desire for Improvement Housing Resilience  ADL's:  Intact  Cognition:  WNL  Sleep:   ok      Assessment and Plan: Major depressive disorder, recurrent.  Anxiety.  Explained in detail about the medication adjustment.  Recommended to cut down her Effexor from 112.5mg  to 75 mg.  In the past we have tried reducing the venlafaxine but she developed withdrawal symptoms.  This time we are titrating very slowly to avoid withdrawal symptoms.  We will also increase Lamictal 100 mg daily for 4 weeks.  Reminded that she need to watch carefully for the rash.  Continue Wellbutrin XL 300 mg daily.  We will provide  Lamictal 100 mg a 30-day supply.  She has a refill remaining on her venlafaxine and she will be given a 90-day prescription off Wellbutrin XL 300 mg daily.  I recommend to start taking the Klonopin only as needed.  She is getting Klonopin 0.5 mg from her PCP Dr. Danise [name].  She is not interested in therapy.  I recommended to call us back if she is any question of any concern.  Follow-up in 4 weeks.    Follow Up Instructions:    I discussed the assessment and treatment plan with the patient. The patient was provided an opportunity to ask questions  and all were answered. The patient agreed with the plan and demonstrated an understanding of the instructions.   The patient was advised to call back or seek an in-person evaluation if the symptoms worsen or if the condition fails to improve as anticipated.  I provided 20 minutes of non-face-to-face time during this encounter.   Kathlee Nations, MD

## 2019-11-14 ENCOUNTER — Other Ambulatory Visit (HOSPITAL_COMMUNITY): Payer: Self-pay | Admitting: Psychiatry

## 2019-11-18 ENCOUNTER — Telehealth: Payer: Self-pay

## 2019-11-18 NOTE — Telephone Encounter (Signed)
Pt called and left a VM asking to r/s her appt on 3/10. I called pt, rescheduled her appt for 12/04/19 at 1:30pm, check-in at [facility], with Jinny Blossom, NP. Pt verbalized understanding of new appt date and time.

## 2019-11-19 ENCOUNTER — Telehealth (HOSPITAL_COMMUNITY): Payer: Self-pay | Admitting: *Deleted

## 2019-11-19 NOTE — Telephone Encounter (Signed)
Writer returned pt call regarding increased fatigue, sleeping most of the day, lack of motivation, increased appetite. Pt reports these issues have been increasing for the last two weeks. Pt describes family, including husband, as not being supportive and bugging her to get a job; pt helps take care of ailing GM. Lisa Curry denies thoughts of self harm. Pt has an upcoming appointment on 12/09/19. Please review.

## 2019-11-20 NOTE — Telephone Encounter (Signed)
I have discussed with her that she should get therapy.  She is under stress from the family and I will suggest consider therapy.  We are adjusting her medication and she may need to give more time until her next appointment on March 22.  I am happy to discuss further adjustment on her next appointment.  She should stop Klonopin as it may be causing fatigue and sleep all day.

## 2019-11-21 ENCOUNTER — Telehealth (HOSPITAL_COMMUNITY): Payer: Self-pay | Admitting: *Deleted

## 2019-11-21 ENCOUNTER — Encounter (INDEPENDENT_AMBULATORY_CARE_PROVIDER_SITE_OTHER): Payer: Self-pay | Admitting: Physician Assistant

## 2019-11-21 NOTE — Telephone Encounter (Signed)
Writer spoke with pt regarding increased depression and fatigue. Pt states she is only taking Klonopin 0.5mg  qd. Writer advised she could cut in half and then taper off as advised and see if mood and fatigue improve. Pt says she will take medication later in the day and see if this helps. Writer advised pt to call this nurse with any questions or concerns. Pt verbalizes understanding.

## 2019-11-21 NOTE — Telephone Encounter (Signed)
Thanks for update

## 2019-11-21 NOTE — Telephone Encounter (Signed)
Please advise 

## 2019-11-25 ENCOUNTER — Ambulatory Visit (INDEPENDENT_AMBULATORY_CARE_PROVIDER_SITE_OTHER): Payer: BC Managed Care – PPO | Admitting: Physician Assistant

## 2019-11-25 ENCOUNTER — Other Ambulatory Visit: Payer: Self-pay

## 2019-11-25 ENCOUNTER — Encounter (INDEPENDENT_AMBULATORY_CARE_PROVIDER_SITE_OTHER): Payer: Self-pay | Admitting: Physician Assistant

## 2019-11-25 VITALS — BP 125/71 | HR 85 | Temp 98.8°F | Ht 64.0 in | Wt 274.0 lb

## 2019-11-25 DIAGNOSIS — E119 Type 2 diabetes mellitus without complications: Secondary | ICD-10-CM | POA: Diagnosis not present

## 2019-11-25 DIAGNOSIS — Z6841 Body Mass Index (BMI) 40.0 and over, adult: Secondary | ICD-10-CM

## 2019-11-25 NOTE — Progress Notes (Signed)
Chief Complaint:   OBESITY Lisa Curry is here to discuss her progress with her obesity treatment plan along with follow-up of her obesity related diagnoses. Lisa Curry is on the Category 3 Plan and states she is following her eating plan approximately 60% of the time. Lisa Curry states she is exercising for 0 minutes 0 times per week.  Today's visit was #: 22 Starting weight: 279 lbs Starting date: 07/26/2018 Today's weight: 274 lbs Today's date: 11/25/2019 Total lbs lost to date: 5 lbs Total lbs lost since last in-office visit: 1 lbs  Interim History: Lisa Curry report that she does well during the day but struggles with dinner because her daughter cooks.  She is not eating enough protein. She started writing down her food on some days.  Subjective:   1. Type 2 diabetes mellitus without complication, without long-term current use of insulin (HCC) Lisa Curry is on Victoza and Actos.  No nausea, vomiting, or diarrhea.  No hyperglycemia.  Fasting blood sugar 124-156.  Lab Results  Component Value Date   HGBA1C 6.2 (H) 08/01/2019   HGBA1C 5.9 (H) 04/22/2019   HGBA1C 5.9 (H) 11/28/2018   Lab Results  Component Value Date   MICROALBUR <0.7 05/16/2018   LDLCALC 60 11/28/2018   CREATININE 0.86 08/01/2019   Lab Results  Component Value Date   INSULIN 9.8 11/28/2018   INSULIN 20.0 07/26/2018   Assessment/Plan:   1. Type 2 diabetes mellitus without complication, without long-term current use of insulin (HCC) Good blood sugar control is important to decrease the likelihood of diabetic complications such as nephropathy, neuropathy, limb loss, blindness, coronary artery disease, and death. Intensive lifestyle modification including diet, exercise and weight loss are the first line of treatment for diabetes.   2. Class 3 severe obesity with serious comorbidity and body mass index (BMI) of 45.0 to 49.9 in adult, unspecified obesity type [facility]) Lisa Curry is currently in the action stage of change. As such, her goal is  to continue with weight loss efforts. She has agreed to the Category 2 Plan +100 calories.   Exercise goals: All adults should avoid inactivity. Some physical activity is better than none, and adults who participate in any amount of physical activity gain some health benefits.  Behavioral modification strategies: meal planning and cooking strategies and keeping healthy foods in the home.  Lisa Curry has agreed to follow-up with our clinic in 2 weeks. She was informed of the importance of frequent follow-up visits to maximize her success with intensive lifestyle modifications for her multiple health conditions.   Objective:   Blood pressure 125/71, pulse 85, temperature 98.8 F (37.1 C), temperature source Oral, height 5\' 4"  (1.626 m), weight 274 lb (124.3 kg), SpO2 97 %. Body mass index is 47.03 kg/m.  General: Cooperative, alert, well developed, in no acute distress. HEENT: Conjunctivae and lids unremarkable. Cardiovascular: Regular rhythm.  Lungs: Normal work of breathing. Neurologic: No focal deficits.   Lab Results  Component Value Date   CREATININE 0.86 08/01/2019   BUN 15 08/01/2019   NA 140 08/01/2019   K 4.5 08/01/2019   CL 98 08/01/2019   CO2 26 08/01/2019   Lab Results  Component Value Date   ALT 19 08/01/2019   AST 17 08/01/2019   ALKPHOS 111 08/01/2019   BILITOT 0.3 08/01/2019   Lab Results  Component Value Date   HGBA1C 6.2 (H) 08/01/2019   HGBA1C 5.9 (H) 04/22/2019   HGBA1C 5.9 (H) 11/28/2018   HGBA1C 5.6 08/24/2018   HGBA1C 6.4  05/16/2018   Lab Results  Component Value Date   INSULIN 9.8 11/28/2018   INSULIN 20.0 07/26/2018   Lab Results  Component Value Date   TSH 2.42 05/16/2018   Lab Results  Component Value Date   CHOL 133 11/28/2018   HDL 46 11/28/2018   LDLCALC 60 11/28/2018   LDLDIRECT 73.0 04/18/2017   TRIG 136 11/28/2018   CHOLHDL 3 05/16/2018   Lab Results  Component Value Date   WBC 13.7 (H) 08/08/2019   HGB 12.9 08/08/2019   HCT  38.8 08/08/2019   MCV 91.5 08/08/2019   PLT 306.0 08/08/2019   Lab Results  Component Value Date   IRON 62 05/30/2016   FERRITIN 55.2 05/30/2016   Attestation Statements:   Reviewed by clinician on day of visit: allergies, medications, problem list, medical history, surgical history, family history, social history, and previous encounter notes.  Time spent on visit including pre-visit chart review and post-visit care and charting was 31 minutes.   I, Water quality scientist, CMA, am acting as Location manager for [employer], PA-C.  I have reviewed the above documentation for accuracy and completeness, and I agree with the above. Lisa Potash, PA-C

## 2019-11-26 ENCOUNTER — Telehealth (HOSPITAL_COMMUNITY): Payer: Self-pay | Admitting: *Deleted

## 2019-11-26 NOTE — Telephone Encounter (Signed)
Pt has left two messages asking for a 45 day Rx of Effexor XR 37.5mg . Pt currently triturating off Effexor should be finishing the 75mg  rx. Please reviw and advise. Pt is taking the Wellbutrin XL 300mg .

## 2019-11-27 ENCOUNTER — Ambulatory Visit: Payer: Self-pay | Admitting: Family Medicine

## 2019-11-27 MED ORDER — VENLAFAXINE HCL ER 37.5 MG PO CP24: ORAL_CAPSULE | ORAL | 0 refills | Status: DC

## 2019-11-27 NOTE — Telephone Encounter (Signed)
Send to [facility] in [place].

## 2019-11-29 ENCOUNTER — Telehealth (HOSPITAL_COMMUNITY): Payer: Self-pay

## 2019-11-29 NOTE — Telephone Encounter (Signed)
[employer] APPROVED  VENLAFAXINE XR 37.5 MG CAPSULE REFERENCE # [id number] EFFECTIVE 11/29/2019 TO 3/12 2022

## 2019-12-03 ENCOUNTER — Encounter: Payer: Self-pay | Admitting: Adult Health

## 2019-12-04 ENCOUNTER — Telehealth (INDEPENDENT_AMBULATORY_CARE_PROVIDER_SITE_OTHER): Payer: BC Managed Care – PPO | Admitting: Adult Health

## 2019-12-04 ENCOUNTER — Telehealth: Payer: Self-pay

## 2019-12-04 DIAGNOSIS — G4733 Obstructive sleep apnea (adult) (pediatric): Secondary | ICD-10-CM

## 2019-12-04 DIAGNOSIS — Z9989 Dependence on other enabling machines and devices: Secondary | ICD-10-CM

## 2019-12-04 NOTE — Telephone Encounter (Signed)
Megan, NP won't be available at 1:30pm today for pt's appt. I called pt, offered her a mychart visit at 3:30pm instead. Pt is agreeable to this. I explained the mychart video visit process. Pt verbalized understanding and is in agreement.

## 2019-12-04 NOTE — Progress Notes (Addendum)
PATIENT: Lisa Curry DOB: 1971-02-14  REASON FOR VISIT: follow up HISTORY FROM: patient  Virtual Visit via Video Note  I connected with [name] on 12/04/19 at  3:30 PM EDT by a video enabled telemedicine application located remotely at [facility] and verified that I am speaking with the correct person using two identifiers who was located at their own home.   I discussed the limitations of evaluation and management by telemedicine and the availability of in person appointments. The patient expressed understanding and agreed to proceed.   PATIENT: Lisa Curry DOB: 1971-01-31  REASON FOR VISIT: follow up HISTORY FROM: patient  HISTORY OF PRESENT ILLNESS: Today 12/04/19:  Ms. Clippard is a 49 year old female with a history of obstructive sleep apnea on CPAP.  Download indicates that she use her machine 24 out of 30 days for compliance of 80%.  Use her machine greater than 4 hours 17 days for compliance of 57%.  On average use her machine 4 hours and 25 minutes.  Her residual AHI is 0.2 on 6-11 cm of water with EPR 3.  Leak in the 95th percentile is 8.3 L/min.  She reports that sometimes she sleeps in the recliner.  She has some concerns as to why my data does not match what she sees on her My[employer] App.  HISTORY (copied from Dr. Guadelupe Sabin note) Ms. Blacketer is a 49 year old right-handed woman with an underlying medical history of diabetes, hyperlipidemia, history of fatty liver, vitamin D deficiency, and morbid obesity with BMI of over 45, who reports snoring and excessive daytime somnolence. I reviewed her office note from 08/15/2018. She also recently saw Dr. Jaynee Eagles in our office for lumbar radiculopathy. Her Epworth sleepiness score is 11 out of 24, fatigue score is 31 out of 63. She is married and lives with her husband, they have 2 adult children. She is a nonsmoker and drinks alcohol occasionally, caffeine in the form of coffee, usually one cup per day and soda, about  20 ounces per day on average. She has been to weight management clinic for the past 2 months and has started to lose some weight. She takes Tylenol with Codeine for back pain. She works part-time as a Oceanographer. When she has to work she gets up around 5 or 5:30. She does not typically wake up rested, bedtime is usually around 11:30 PM. They have 1 dog and 2 cats in the household and the daughter typically sleeps on the bed with them. She sometimes wakes up due to the dog disturbing her or due to back pain and sometimes she sleeps in the recliner. She has nocturia about once per average night and has had the occasional morning headache. She had one paternal uncle with sleep apnea. She is a side sleeper typically.  REVIEW OF SYSTEMS: Out of a complete 14 system review of symptoms, the patient complains only of the following symptoms, and all other reviewed systems are negative.  See HPI  ALLERGIES: Allergies  Allergen Reactions  . Povidone Iodine Hives  . Biaxin [Clarithromycin] Hives  . Metformin And Related Diarrhea    HOME MEDICATIONS: Outpatient Medications Prior to Visit  Medication Sig Dispense Refill  . acetaminophen-codeine (TYLENOL #3) 300-30 MG tablet Take 1 tablet by mouth every 8 (eight) hours as needed for moderate pain. 15 tablet 0  . Aspirin-Acetaminophen-Caffeine (EXCEDRIN PO) Take 1 capsule by mouth daily. As needed    . atorvastatin (LIPITOR) 20 MG tablet Take 1  tablet (20 mg total) by mouth daily at 6 PM. 90 tablet 3  . buPROPion (WELLBUTRIN XL) 300 MG 24 hr tablet Take 1 tablet (300 mg total) by mouth every morning. 90 tablet 0  . Cholecalciferol (VITAMIN D3) 125 MCG (5000 UT) TABS Take 1 tablet by mouth daily.    . clonazePAM (KLONOPIN) 0.5 MG tablet TAKE 1 TABLET BY MOUTH TWICE A DAY AS NEEDED FOR ANXIETY 60 tablet 0  . cyclobenzaprine (FLEXERIL) 10 MG tablet Take 1 tablet (10 mg total) by mouth 2 (two) times daily as needed for muscle spasms (sedation  precautions). 30 tablet 1  . dexlansoprazole (DEXILANT) 60 MG capsule TAKE 1 CAPSULE BY MOUTH EVERY DAY 90 capsule 3  . docusate sodium (COLACE) 100 MG capsule Take 1 capsule (100 mg total) by mouth 2 (two) times daily. (Patient taking differently: Take 100 mg by mouth 2 (two) times daily. As needed) 180 capsule 0  . fluticasone (FLONASE) 50 MCG/ACT nasal spray Place into both nostrils daily. As needed    . gabapentin (NEURONTIN) 300 MG capsule Take 1 capsule (300 mg total) by mouth 3 (three) times daily.    Marland Kitchen glucose blood (ONE TOUCH ULTRA TEST) test strip Use to check sugar twice daily and as needed. Dx: E11.65 300 each 3  . ibuprofen (ADVIL,MOTRIN) 600 MG tablet Take 600 mg by mouth daily.    . Insulin Pen Needle (BD PEN NEEDLE NANO 2ND GEN) 32G X 4 MM MISC 1 Device by Does not apply route daily. 100 each 0  . lamoTRIgine (LAMICTAL) 100 MG tablet Take 1 tablet (100 mg total) by mouth daily. 30 tablet 0  . Lancet Devices (ONE TOUCH DELICA LANCING DEV) MISC Use as directed with lancets and one touch meter E11.65 1 each 0  . linaclotide (LINZESS) 145 MCG CAPS capsule Take 1 capsule (145 mcg total) by mouth daily. 30 capsule 0  . liraglutide (VICTOZA) 18 MG/3ML SOPN Inject 0.2 mLs (1.2 mg total) into the skin daily. 6 pen 0  . meclizine (ANTIVERT) 12.5 MG tablet Take 12.5 mg by mouth 3 (three) times daily as needed for dizziness.    . Multiple Vitamin (MULTIVITAMIN) capsule Take 1 capsule by mouth daily.    Glory Rosebush DELICA LANCETS [id number] MISC Check blood sugar twice daily and as directed. Dx E11.65 200 each 0  . pioglitazone (ACTOS) 30 MG tablet Take 1 tablet (30 mg total) by mouth daily. 90 tablet 3  . polyethylene glycol powder (GLYCOLAX/MIRALAX) 17 GM/SCOOP powder Take 17 g by mouth daily. 3350 g 0  . venlafaxine XR (EFFEXOR XR) 37.5 MG 24 hr capsule Take two capsule for one week and than one capsule daily. 60 capsule 0   Facility-Administered Medications Prior to Visit  Medication Dose Route  Frequency Provider Last Rate Last Admin  . diclofenac sodium (VOLTAREN) 1 % transdermal gel 2 g  2 g Topical QID Jessy Oto, MD        PAST MEDICAL HISTORY: Past Medical History:  Diagnosis Date  . Allergy   . Anemia   . Anxiety   . Barrett esophagus    Don Bull[place], have not received EGD report  . Chronic back pain   . Constipation   . Depression with anxiety   . Diabetes mellitus without complication ([place])    DSME 04/2014  . Edema, lower extremity   . Fatty liver   . Gastric polyp 05/2012   h/o EGD  . GERD (gastroesophageal reflux disease)   .  History of Helicobacter pylori infection   . HLD (hyperlipidemia)   . Hyperlipidemia   . Vitamin D deficiency     PAST SURGICAL HISTORY: Past Surgical History:  Procedure Laterality Date  . CESAREAN SECTION  6/95, 4/98  . DILATION AND CURETTAGE OF UTERUS  2011  . ESOPHAGOGASTRODUODENOSCOPY  2012?   Ferdinand L[facility]  . ESOPHAGOGASTRODUODENOSCOPY  10/2017   WNL Ardis Hughs)  . LUMBAR DISC SURGERY  03/1995  . NASAL SINUS SURGERY  03/2001    FAMILY HISTORY: Family History  Problem Relation Age of Onset  . Breast cancer Maternal Grandmother 49  . Dementia Maternal Grandmother   . Heart disease Paternal Grandfather   . Other Sister        Postural Orthostatic Tachycardia Syndrome  . Supraventricular tachycardia Sister   . Other Maternal Aunt        hysterectomy for irregular bleed with abnormal pap  . Hyperlipidemia Mother   . Alzheimer's disease Maternal Grandfather     SOCIAL HISTORY: Social History   Socioeconomic History  . Marital status: Married    Spouse name: W. Elizabethann Obanion  . Number of children: 2  . Years of education: Not on file  . Highest education level: Bachelor's degree (e.g., BA, AB, BS)  Occupational History  . Occupation: Pharmacist, hospital, care giver  Tobacco Use  . Smoking status: Never Smoker  . Smokeless tobacco: Never Used  Substance and Sexual Activity  . Alcohol use: Yes     Alcohol/week: 0.0 standard drinks    Comment: occasional  . Drug use: No  . Sexual activity: Not on file  Other Topics Concern  . Not on file  Social History Narrative   Lives with husband and 2 children, 2 dogs, 2 cats and 4 ducks and 16 chickens and a fish   Occ: special ed Oceanographer.   Activity: no regular exercise   Diet: good water, fruits/vegetables daily   Right handed   Caffeine: daily   Social Determinants of Health   Financial Resource Strain:   . Difficulty of Paying Living Expenses:   Food Insecurity:   . Worried About Charity fundraiser in the Last Year:   . Arboriculturist in the Last Year:   Transportation Needs:   . Film/video editor (Medical):   Marland Kitchen Lack of Transportation (Non-Medical):   Physical Activity:   . Days of Exercise per Week:   . Minutes of Exercise per Session:   Stress:   . Feeling of Stress :   Social Connections:   . Frequency of Communication with Friends and Family:   . Frequency of Social Gatherings with Friends and Family:   . Attends Religious Services:   . Active Member of Clubs or Organizations:   . Attends Archivist Meetings:   Marland Kitchen Marital Status:   Intimate Partner Violence:   . Fear of Current or Ex-Partner:   . Emotionally Abused:   Marland Kitchen Physically Abused:   . Sexually Abused:       PHYSICAL EXAM Generalized: Well developed, in no acute distress   Neurological examination  Mentation: Alert oriented to time, place, history taking. Follows all commands speech and language fluent Cranial nerve II-XII:Extraocular movements were full. Facial symmetry noted. Marland Kitchen Head turning and shoulder shrug  were normal and symmetric. Motor: Good strength throughout subjectively per patient  Reflexes: UTA  DIAGNOSTIC DATA (LABS, IMAGING, TESTING) - I reviewed patient records, labs, notes, testing and imaging myself where available.  Lab Results  Component Value Date   WBC 13.7 (H) 08/08/2019   HGB 12.9 08/08/2019    HCT 38.8 08/08/2019   MCV 91.5 08/08/2019   PLT 306.0 08/08/2019      Component Value Date/Time   NA 140 08/01/2019 1245   K 4.5 08/01/2019 1245   CL 98 08/01/2019 1245   CO2 26 08/01/2019 1245   GLUCOSE 94 08/01/2019 1245   GLUCOSE 89 05/16/2018 1747   BUN 15 08/01/2019 1245   CREATININE 0.86 08/01/2019 1245   CREATININE 0.68 03/28/2014 1632   CALCIUM 9.2 08/01/2019 1245   PROT 6.6 08/01/2019 1245   ALBUMIN 4.4 08/01/2019 1245   AST 17 08/01/2019 1245   ALT 19 08/01/2019 1245   ALKPHOS 111 08/01/2019 1245   BILITOT 0.3 08/01/2019 1245   GFRNONAA 80 08/01/2019 1245   GFRAA 92 08/01/2019 1245   Lab Results  Component Value Date   CHOL 133 11/28/2018   HDL 46 11/28/2018   LDLCALC 60 11/28/2018   LDLDIRECT 73.0 04/18/2017   TRIG 136 11/28/2018   CHOLHDL 3 05/16/2018   Lab Results  Component Value Date   HGBA1C 6.2 (H) 08/01/2019   Lab Results  Component Value Date   VITAMINB12 467 03/28/2014   Lab Results  Component Value Date   TSH 2.42 05/16/2018      ASSESSMENT AND PLAN 49 y.o. year old female  has a past medical history of Allergy, Anemia, Anxiety, Barrett esophagus, Chronic back pain, Constipation, Depression with anxiety, Diabetes mellitus without complication ([place]), Edema, lower extremity, Fatty liver, Gastric polyp (05/2012), GERD (gastroesophageal reflux disease), History of Helicobacter pylori infection, HLD (hyperlipidemia), Hyperlipidemia, and Vitamin D deficiency. here with:  1.  Obstructive sleep apnea on CPAP  -Suboptimal compliance encouraged the patient to try to use the machine greater than 4 hours each night.  Ideally she should use the machine the entire time she is sleeping  -Good treatment of apnea -Advised if symptoms worsen or she develops new symptoms she should let us know.  Follow-up in 6 months or sooner if needed   I spent 25 minutes of face-to-face and non-face-to-face time with patient.  This included previsit chart review,study  review, order entry, electronic health record documentation, patient education.    Ward Givens, MSN, NP-C 12/04/2019, 4:06 PM [facility] [address], [place], [place] [address] [phone]  I reviewed the above note and documentation by the Nurse Practitioner and agree with the history, exam, assessment and plan as outlined above. I was available for consultation. Star Age, MD, PhD [facility])

## 2019-12-09 ENCOUNTER — Other Ambulatory Visit: Payer: Self-pay

## 2019-12-09 ENCOUNTER — Encounter (HOSPITAL_COMMUNITY): Payer: Self-pay | Admitting: Psychiatry

## 2019-12-09 ENCOUNTER — Ambulatory Visit (INDEPENDENT_AMBULATORY_CARE_PROVIDER_SITE_OTHER): Payer: BC Managed Care – PPO | Admitting: Psychiatry

## 2019-12-09 ENCOUNTER — Telehealth (HOSPITAL_COMMUNITY): Payer: Self-pay | Admitting: *Deleted

## 2019-12-09 DIAGNOSIS — F331 Major depressive disorder, recurrent, moderate: Secondary | ICD-10-CM

## 2019-12-09 DIAGNOSIS — F419 Anxiety disorder, unspecified: Secondary | ICD-10-CM

## 2019-12-09 NOTE — Progress Notes (Signed)
Virtual Visit via Telephone Note  I connected with Lisa Curry on 12/09/19 at 10:40 AM EDT by telephone and verified that I am speaking with the correct person using two identifiers.   I discussed the limitations, risks, security and privacy concerns of performing an evaluation and management service by telephone and the availability of in person appointments. I also discussed with the patient that there may be a patient responsible charge related to this service. The patient expressed understanding and agreed to proceed.   History of Present Illness: Patient was evaluated by phone session.  On the last visit we increase Lamictal and she is taking 100 mg every day.  She reported no side effects including any rash or any itching.  She has cut down her Klonopin and really takes it when she feels very nervous.  Her fatigue is much better since she cut down her Klonopin.  However she still have episodic irritability mood swing and she noticed specially around her monthly cycles.  She is taking care of her 50 year old grandmother who requires 24/7 help.  She goes few days to help her.  She denies any suicidal thoughts or homicidal thoughts.  She lives with her husband and 2 children.  She feels sometimes anxious and random crying spells but denies any suicidal thoughts, paranoia or any hallucination.  She has not been back to work since [employer].  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  She is scheduled to see her PCP to have her blood work for hemoglobin A1c.  She is compliant with other medication.  She is not interested in therapy.   Past Psychiatric History: H/Oseeingpsychiatriston and off for many years. SawDr. Perley Jain. [place] and then Dr. Tiajuana Amass. Lately PCPprescribed her meds. TriedCelexa, Zoloft and Paxilwhichcausedexcessive sleep.Goodresponse with Zoloft.    Psychiatric  Specialty Exam: Physical Exam  Review of Systems  There were no vitals taken for this visit.There is no height or weight on file to calculate BMI.  General Appearance: NA  Eye Contact:  NA  Speech:  Clear and Coherent and Slow  Volume:  Normal  Mood:  Dysphoric and Irritable  Affect:  NA  Thought Process:  Descriptions of Associations: Intact  Orientation:  Full (Time, Place, and Person)  Thought Content:  Rumination  Suicidal Thoughts:  No  Homicidal Thoughts:  No  Memory:  Immediate;   Good Recent;   Good Remote;   Good  Judgement:  Intact  Insight:  Present  Psychomotor Activity:  NA  Concentration:  Concentration: Fair and Attention Span: Fair  Recall:  Good  Fund of Knowledge:  Good  Language:  Good  Akathisia:  No  Handed:  Right  AIMS (if indicated):     Assets:  Communication Skills Desire for Improvement Housing Resilience Social Support  ADL's:  Intact  Cognition:  WNL  Sleep:   fair on cpap      Assessment and Plan: Major depressive disorder, recurrent.  Anxiety.  Her fatigue is much better since she had cut down her Klonopin and she only takes Klonopin when she is very nervous.  Her Klonopin is given by her PCP Dr. Danise [name].  I recommend to try higher dose of Lamictal since she is tolerating and reported no side effects.  She is open to try Lamictal 150 mg to target her residual mood lability and depression.  She will try cutting  down her Effexor from 75 mg to only 37.5 mg a day.  Continue Wellbutrin XL 300 mg daily.  She has enough medication until her next appointment for 4 to 6 weeks.  She is not interested in therapy.  Recommended to call us back if she has any question of any concern.  I recommend if she is running low on her medication then she should call us to get the refills.  Follow-up in 4 to 6 weeks.  Follow Up Instructions:    I discussed the assessment and treatment plan with the patient. The patient was provided an opportunity to ask  questions and all were answered. The patient agreed with the plan and demonstrated an understanding of the instructions.   The patient was advised to call back or seek an in-person evaluation if the symptoms worsen or if the condition fails to improve as anticipated.  I provided 20 minutes of non-face-to-face time during this encounter.   Kathlee Nations, MD

## 2019-12-09 NOTE — Telephone Encounter (Signed)
Venlafaxin HCL ER 37.5 mg is now approved by [employer]. Through 11/28/20.

## 2019-12-10 ENCOUNTER — Encounter (INDEPENDENT_AMBULATORY_CARE_PROVIDER_SITE_OTHER): Payer: Self-pay | Admitting: Family Medicine

## 2019-12-10 ENCOUNTER — Other Ambulatory Visit: Payer: Self-pay

## 2019-12-10 ENCOUNTER — Ambulatory Visit (INDEPENDENT_AMBULATORY_CARE_PROVIDER_SITE_OTHER): Payer: BC Managed Care – PPO | Admitting: Family Medicine

## 2019-12-10 VITALS — BP 122/72 | HR 85 | Temp 98.1°F | Ht 64.0 in | Wt 269.0 lb

## 2019-12-10 DIAGNOSIS — E1169 Type 2 diabetes mellitus with other specified complication: Secondary | ICD-10-CM | POA: Diagnosis not present

## 2019-12-10 DIAGNOSIS — E119 Type 2 diabetes mellitus without complications: Secondary | ICD-10-CM

## 2019-12-10 DIAGNOSIS — E559 Vitamin D deficiency, unspecified: Secondary | ICD-10-CM | POA: Diagnosis not present

## 2019-12-10 DIAGNOSIS — E785 Hyperlipidemia, unspecified: Secondary | ICD-10-CM

## 2019-12-10 DIAGNOSIS — Z9189 Other specified personal risk factors, not elsewhere classified: Secondary | ICD-10-CM

## 2019-12-10 DIAGNOSIS — Z6841 Body Mass Index (BMI) 40.0 and over, adult: Secondary | ICD-10-CM

## 2019-12-11 ENCOUNTER — Ambulatory Visit (HOSPITAL_COMMUNITY): Payer: BC Managed Care – PPO | Admitting: Psychiatry

## 2019-12-11 LAB — LIPID PANEL WITH LDL/HDL RATIO
Cholesterol, Total: 121 mg/dL (ref 100–199)
HDL: 44 mg/dL (ref >39)
LDL Chol Calc (NIH): 57 mg/dL (ref 0–99)
LDL/HDL Ratio: 1.3 ratio (ref 0.0–3.2)
Triglycerides: 108 mg/dL (ref 0–149)
VLDL Cholesterol Cal: 20 mg/dL (ref 5–40)

## 2019-12-11 LAB — CBC WITH DIFFERENTIAL/PLATELET
Basophils Absolute: 0 10*3/uL (ref 0.0–0.2)
Basos: 0 %
EOS (ABSOLUTE): 0.2 10*3/uL (ref 0.0–0.4)
Eos: 2 %
Hematocrit: 41.6 % (ref 34.0–46.6)
Hemoglobin: 13.6 g/dL (ref 11.1–15.9)
Immature Grans (Abs): 0 10*3/uL (ref 0.0–0.1)
Immature Granulocytes: 0 %
Lymphocytes Absolute: 2.2 10*3/uL (ref 0.7–3.1)
Lymphs: 26 %
MCH: 29.8 pg (ref 26.6–33.0)
MCHC: 32.7 g/dL (ref 31.5–35.7)
MCV: 91 fL (ref 79–97)
Monocytes Absolute: 0.3 10*3/uL (ref 0.1–0.9)
Monocytes: 4 %
Neutrophils Absolute: 5.8 10*3/uL (ref 1.4–7.0)
Neutrophils: 68 %
Platelets: 292 10*3/uL (ref 150–450)
RBC: 4.56 x10E6/uL (ref 3.77–5.28)
RDW: 13.4 % (ref 11.7–15.4)
WBC: 8.6 10*3/uL (ref 3.4–10.8)

## 2019-12-11 LAB — COMPREHENSIVE METABOLIC PANEL
ALT: 17 IU/L (ref 0–32)
AST: 22 IU/L (ref 0–40)
Albumin/Globulin Ratio: 2 (ref 1.2–2.2)
Albumin: 4.5 g/dL (ref 3.8–4.8)
Alkaline Phosphatase: 113 IU/L (ref 39–117)
BUN/Creatinine Ratio: 12 (ref 9–23)
BUN: 10 mg/dL (ref 6–24)
Bilirubin Total: 0.4 mg/dL (ref 0.0–1.2)
CO2: 23 mmol/L (ref 20–29)
Calcium: 9.4 mg/dL (ref 8.7–10.2)
Chloride: 103 mmol/L (ref 96–106)
Creatinine, Ser: 0.84 mg/dL (ref 0.57–1.00)
GFR calc Af Amer: 94 mL/min/{1.73_m2} (ref >59)
GFR calc non Af Amer: 82 mL/min/{1.73_m2} (ref >59)
Globulin, Total: 2.3 g/dL (ref 1.5–4.5)
Glucose: 86 mg/dL (ref 65–99)
Potassium: 4.1 mmol/L (ref 3.5–5.2)
Sodium: 142 mmol/L (ref 134–144)
Total Protein: 6.8 g/dL (ref 6.0–8.5)

## 2019-12-11 LAB — TSH: TSH: 2.33 u[IU]/mL (ref 0.450–4.500)

## 2019-12-11 LAB — HEMOGLOBIN A1C
Est. average glucose Bld gHb Est-mCnc: 131 mg/dL
Hgb A1c MFr Bld: 6.2 % — ABNORMAL HIGH (ref 4.8–5.6)

## 2019-12-11 LAB — VITAMIN D 25 HYDROXY (VIT D DEFICIENCY, FRACTURES): Vit D, 25-Hydroxy: 62 ng/mL (ref 30.0–100.0)

## 2019-12-11 LAB — T3: T3, Total: 108 ng/dL (ref 71–180)

## 2019-12-11 LAB — FOLATE: Folate: 19.1 ng/mL (ref >3.0)

## 2019-12-11 LAB — T4, FREE: Free T4: 0.97 ng/dL (ref 0.82–1.77)

## 2019-12-11 LAB — VITAMIN B12: Vitamin B-12: 688 pg/mL (ref 232–1245)

## 2019-12-11 LAB — INSULIN, RANDOM: INSULIN: 9.4 u[IU]/mL (ref 2.6–24.9)

## 2019-12-11 MED ORDER — PEN NEEDLES 32G X 6 MM MISC: 1.0000 | Freq: Every day | 0 refills | Status: DC

## 2019-12-11 NOTE — Progress Notes (Signed)
Chief Complaint:   OBESITY Lisa Curry is here to discuss her progress with her obesity treatment plan along with follow-up of her obesity related diagnoses. Lisa Curry is on the Category 2 Plan + 100 calories and states she is following her eating plan approximately 60% of the time. Lisa Curry states she is doing 0 minutes 0 times per week.  Today's visit was #: 23 Starting weight: 279 lbs Starting date: 07/26/2018 Today's weight: 269 lbs Today's date: 12/10/2019 Total lbs lost to date: 10 Total lbs lost since last in-office visit: 5  Interim History: Lisa Curry reports increase in hunger around 1130pm. She is typically eating supper around 830pm. She has been journaling her food intake on Category 2 meal plan. She reports increase in swelling in her hands and feet the last few weeks. She was encouraged to keep her sodium intake limited and adequate water intake.  Subjective:   1. Type 2 diabetes mellitus without complication, with long-term current use of insulin ([place]) Lisa Curry's last A1c was 6.2 on 08/01/2019. She is on Actos 30 mg q daily and liraglutide 1.2 mg q daily.  2. Vitamin D deficiency Lisa Curry is currently on OTC Vit D 5,000 IU q daily. Last Vit D level was 53.8 on 08/01/2019.  3. Hyperlipidemia associated with type 2 diabetes mellitus (Lisa Curry) Lisa Curry is currently on atorvastatin 20 mg q daily, and is tolerating it well. Last lipid panel was 11/2018. Her LDL was 60, total cholesterol 133, HDL 46, and triglycerides 136.  4. At risk for dehydration Lisa Curry is at risk for dehydration due to weight loss.  Assessment/Plan:   1. Type 2 diabetes mellitus without complication, with long-term current use of insulin (Lisa Curry) Good blood sugar control is important to decrease the likelihood of diabetic complications such as nephropathy, neuropathy, limb loss, blindness, coronary artery disease, and death. Intensive lifestyle modification including diet, exercise and weight loss are the first line of treatment for  diabetes. We will check labs today. We will refill nano needles #100 with no refills. Dejon is to continue current close monitoring of BGs at home, and continue her current medications regimen.  - CBC with Differential/Platelet - Folate - Hemoglobin A1c - Insulin, random - T3 - T4, free - TSH - Vitamin B12  2. Vitamin D deficiency Low Vitamin D level contributes to fatigue and are associated with obesity, breast, and colon cancer. Lisa Curry will follow-up for routine testing of Vitamin D, at least 2-3 times per year to avoid over-replacement. We will check labs today.  - VITAMIN D 25 Hydroxy (Vit-D Deficiency, Fractures)  3. Hyperlipidemia associated with type 2 diabetes mellitus (Lisa Curry) Cardiovascular risk and specific lipid/LDL goals reviewed. We discussed several lifestyle modifications today and Lisa Curry will continue to work on diet, exercise and weight loss efforts. We will check labs today. Orders and follow up as documented in patient record.   - Comprehensive metabolic panel - Lipid Panel With LDL/HDL Ratio  4. At risk for dehydration Lisa Curry was given approximately 30 minutes dehydration prevention counseling today. Lisa Curry is at risk for dehydration due to weight loss. She is to decrease her sodium intake and she was encouraged to drink adequate water. She was encouraged to hydrate and monitor fluid status to avoid dehydration as well as weight loss plateaus.   5. Class 3 severe obesity with serious comorbidity and body mass index (BMI) of 45.0 to 49.9 in adult, unspecified obesity type Lisa Curry) Lisa Curry is currently in the action stage of change. As such, her goal is  to continue with weight loss efforts. She has agreed to the Category 2 Plan + 100 calories.  Exercise goals: No exercise has been prescribed at this time.  Behavioral modification strategies: increasing water intake, decreasing sodium intake and meal planning and cooking strategies.  Lisa Curry has agreed to follow-up with our clinic in 2  to 3 weeks. She was informed of the importance of frequent follow-up visits to maximize her success with intensive lifestyle modifications for her multiple health conditions.   Lisa Curry was informed we would discuss her lab results at her next visit unless there is a critical issue that needs to be addressed sooner. Lisa Curry agreed to keep her next visit at the agreed upon time to discuss these results.  Objective:   Blood pressure 122/72, pulse 85, temperature 98.1 F (36.7 C), temperature source Oral, height 5\' 4"  (1.626 m), weight 269 lb (122 kg), SpO2 98 %. Body mass index is 46.17 kg/m.  General: Cooperative, alert, well developed, in no acute distress. HEENT: Conjunctivae and lids unremarkable. Cardiovascular: Regular rhythm.  Lungs: Normal work of breathing. Neurologic: No focal deficits.   Lab Results  Component Value Date   CREATININE 0.84 12/10/2019   BUN 10 12/10/2019   NA 142 12/10/2019   K 4.1 12/10/2019   CL 103 12/10/2019   CO2 23 12/10/2019   Lab Results  Component Value Date   ALT 17 12/10/2019   AST 22 12/10/2019   ALKPHOS 113 12/10/2019   BILITOT 0.4 12/10/2019   Lab Results  Component Value Date   HGBA1C 6.2 (H) 12/10/2019   HGBA1C 6.2 (H) 08/01/2019   HGBA1C 5.9 (H) 04/22/2019   HGBA1C 5.9 (H) 11/28/2018   HGBA1C 5.6 08/24/2018   Lab Results  Component Value Date   INSULIN WILL FOLLOW 12/10/2019   INSULIN 9.8 11/28/2018   INSULIN 20.0 07/26/2018   Lab Results  Component Value Date   TSH 2.330 12/10/2019   Lab Results  Component Value Date   CHOL 121 12/10/2019   HDL 44 12/10/2019   LDLCALC 57 12/10/2019   LDLDIRECT 73.0 04/18/2017   TRIG 108 12/10/2019   CHOLHDL 3 05/16/2018   Lab Results  Component Value Date   WBC 8.6 12/10/2019   HGB 13.6 12/10/2019   HCT 41.6 12/10/2019   MCV 91 12/10/2019   PLT 292 12/10/2019   Lab Results  Component Value Date   IRON 62 05/30/2016   FERRITIN 55.2 05/30/2016   Attestation Statements:    Reviewed by clinician on day of visit: allergies, medications, problem list, medical history, surgical history, family history, social history, and previous encounter notes.   I, Trixie Dredge, am acting as transcriptionist for Dennard Nip, MD.  I have reviewed the above documentation for accuracy and completeness, and I agree with the above. -  Dennard Nip, MD

## 2019-12-14 ENCOUNTER — Other Ambulatory Visit (HOSPITAL_COMMUNITY): Payer: Self-pay | Admitting: Psychiatry

## 2019-12-14 DIAGNOSIS — F331 Major depressive disorder, recurrent, moderate: Secondary | ICD-10-CM

## 2019-12-19 NOTE — Telephone Encounter (Signed)
I called pt per Jinny Blossom, NP's request. I advised her that we will let [employer] know of the discrepancy in data between what she can see and what we can see in airview. She reports that she has already brought this to [employer]'s attention a while back. I will ask [employer]'s manager to further look into this and call the pt back. Pt verbalized understanding.

## 2019-12-19 NOTE — Telephone Encounter (Signed)
Received this notice from [employer]: "Absolutely! I will have an RT give her a call."

## 2019-12-23 NOTE — Telephone Encounter (Signed)
Received this notice from [place], RT at [place]: "This has been done. I was not able to help her due to it being an app issue.  However, I did give her the number for [employer] tech support."

## 2019-12-24 ENCOUNTER — Telehealth (HOSPITAL_COMMUNITY): Payer: Self-pay

## 2019-12-24 NOTE — Telephone Encounter (Signed)
Pt called and left a message stating she would like to speak with her doctor. Pt was called back and made aware doctor was aware she would like to speak wit him and would try to call when he is available. Pt verbalized understanding.

## 2019-12-25 ENCOUNTER — Ambulatory Visit (INDEPENDENT_AMBULATORY_CARE_PROVIDER_SITE_OTHER): Payer: BC Managed Care – PPO | Admitting: Family Medicine

## 2019-12-25 DIAGNOSIS — G4733 Obstructive sleep apnea (adult) (pediatric): Secondary | ICD-10-CM | POA: Diagnosis not present

## 2019-12-30 ENCOUNTER — Telehealth (HOSPITAL_COMMUNITY): Payer: Self-pay | Admitting: *Deleted

## 2019-12-30 NOTE — Telephone Encounter (Signed)
Pt called this Probation officer regarding medication changes and wanting to see provider face to face. Pt was anxious and tearful on the phone. Pt also states that her anger is now explosive and she's irritable all the time, or crying. Pt sounded sad, hopeless. Denies s.i.

## 2019-12-31 ENCOUNTER — Telehealth (HOSPITAL_COMMUNITY): Payer: Self-pay

## 2019-12-31 ENCOUNTER — Encounter: Payer: Self-pay | Admitting: Family Medicine

## 2019-12-31 DIAGNOSIS — F331 Major depressive disorder, recurrent, moderate: Secondary | ICD-10-CM

## 2019-12-31 NOTE — Telephone Encounter (Signed)
See pt mychart message

## 2019-12-31 NOTE — Telephone Encounter (Signed)
I returned patient's phone call.  She is very upset that her messages are not given on time.  Actually her messages are responding on the regular basis.  She wants to see physician person-to-person.  Explained about the policy due to Covid.  I offer video visits but patient refused.  She is not happy with the staff.  I offer if she is not happy then she can try other physician but patient told that she had tried other physicians in the past.  She hangs up the phone.  We will send a letter that if she decided to establish her care in a different office and we will send her the best of providers.

## 2019-12-31 NOTE — Telephone Encounter (Signed)
Spoke with pt on the phone. She is requesting information be sent to "[facility]" as they have availability to see new patients in person next week and she would like to transfer her care there.

## 2019-12-31 NOTE — Telephone Encounter (Signed)
We will need her consent and contact information of [facility] so we can send the records to them.

## 2019-12-31 NOTE — Telephone Encounter (Signed)
Pt called and requested to speak with a nurse. She expressed concerns about not feeling heard in her need to see a provider in person. Pt requesting to find a new provider who will be able to fulfill this request. Pt verbalized understanding that this may be challenging due to the ongoing pandemic, however is adamant about getting a provider to see in person. Pt requesting return phone call from RN for updates on if able to find provider taking new patients who is able to see her in person.

## 2020-01-01 ENCOUNTER — Ambulatory Visit
Admission: EM | Admit: 2020-01-01 | Discharge: 2020-01-01 | Disposition: A | Discharge status: Home / Self Care | Payer: BC Managed Care – PPO

## 2020-01-01 ENCOUNTER — Encounter: Payer: Self-pay | Admitting: Emergency Medicine

## 2020-01-01 ENCOUNTER — Telehealth: Payer: Self-pay

## 2020-01-01 ENCOUNTER — Encounter (HOSPITAL_COMMUNITY): Payer: Self-pay | Admitting: Psychiatry

## 2020-01-01 ENCOUNTER — Other Ambulatory Visit: Payer: Self-pay

## 2020-01-01 DIAGNOSIS — R42 Dizziness and giddiness: Secondary | ICD-10-CM | POA: Diagnosis not present

## 2020-01-01 DIAGNOSIS — Z636 Dependent relative needing care at home: Secondary | ICD-10-CM

## 2020-01-01 DIAGNOSIS — F439 Reaction to severe stress, unspecified: Secondary | ICD-10-CM | POA: Diagnosis not present

## 2020-01-01 NOTE — ED Provider Notes (Signed)
[name]    CSN: [id number] Arrival date & time: 01/01/20  1223      History   Chief Complaint Chief Complaint  Patient presents with  . Dizziness    HPI [name] is a 49 y.o. female with history of diabetes, BCC, allergies, MDD, anxiety presenting for 1 month course of intermittent dizziness, lightheadedness, nausea.  Patient currently going through medication adjustment: Tapering venlafaxine, starting Lamictal.  Patient endorses history of both vertigo and sinusitis, states this feels similar.  Meclizine did not help.  Denies history of stroke.  Most notable is patient's stressors at home: Cares for grandmother with dementia needs help with ADLs.  States her father also has back surgery to and seems concerned about how he will tolerate this.  Patient denies SI/HI.  Feels safe at home.  Has not discussed caregiver burden with PCP.  No chest pain, palpitations, sob, N/V.   Past Medical History:  Diagnosis Date  . Allergy   . Anemia   . Anxiety   . Barrett esophagus    [place], have not received EGD report  . Chronic back pain   . Constipation   . Depression with anxiety   . Diabetes mellitus without complication ([place])    DSME 04/2014  . Edema, lower extremity   . Fatty liver   . Gastric polyp 05/2012   h/o EGD  . GERD (gastroesophageal reflux disease)   . History of Helicobacter pylori infection   . HLD (hyperlipidemia)   . Hyperlipidemia   . Vitamin D deficiency     Patient Active Problem List   Diagnosis Date Noted  . Chronic constipation 09/21/2019  . Onychomycosis 09/21/2019  . Leukocytosis 08/08/2019  . Other depression, emotional eating 11/07/2018  . OSA (obstructive sleep apnea) 10/23/2018  . Class 3 severe obesity with serious comorbidity and body mass index (BMI) of 40.0 to 44.9 in adult ([place]) 08/22/2018  . Type 2 diabetes mellitus without complication, without long-term current use of insulin ([place]) 07/26/2018  . Fatty liver 07/26/2018    . Anemia 09/01/2016  . Umbilical hernia 0000000  . Lower back pain 08/16/2016  . Vitamin D deficiency   . HLD (hyperlipidemia)   . Encounter for routine adult medical exam with abnormal findings 01/20/2015  . Morbid obesity with BMI of 45.0-49.9, adult ([place]) 11/04/2013  . GERD (gastroesophageal reflux disease) 11/04/2013  . MDD (major depressive disorder), recurrent episode, moderate ([place]) 11/04/2013    Past Surgical History:  Procedure Laterality Date  . CESAREAN SECTION  6/95, 4/98  . DILATION AND CURETTAGE OF UTERUS  2011  . ESOPHAGOGASTRODUODENOSCOPY  2012?   [facility]  . ESOPHAGOGASTRODUODENOSCOPY  10/2017   [facility] Hughs)  . LUMBAR DISC SURGERY  03/1995  . NASAL SINUS SURGERY  03/2001    OB History    Gravida  2   Para  2   Term      Preterm      AB      Living        SAB      TAB      Ectopic      Multiple      Live Births               Home Medications    Prior to Admission medications   Medication Sig Start Date End Date Taking? Authorizing Provider  acetaminophen-codeine (TYLENOL #3) 300-30 MG tablet Take 1 tablet by mouth every 8 (eight)  hours as needed for moderate pain. 07/02/19   Ria Bush, MD  Aspirin-Acetaminophen-Caffeine (EXCEDRIN PO) Take 1 capsule by mouth daily. As needed    [provider]  atorvastatin (LIPITOR) 20 MG tablet Take 1 tablet (20 mg total) by mouth daily at 6 PM. 09/18/19   Ria Bush, MD  buPROPion (WELLBUTRIN XL) 300 MG 24 hr tablet Take 1 tablet (300 mg total) by mouth every morning. 11/13/19 11/12/20  Arfeen, Arlyce Harman, MD  Cholecalciferol (VITAMIN D3) 125 MCG (5000 UT) TABS Take 1 tablet by mouth daily.    [provider]  clonazePAM (KLONOPIN) 0.5 MG tablet TAKE 1 TABLET BY MOUTH TWICE A DAY AS NEEDED FOR ANXIETY Patient taking differently: Provider suggested stopping this.  Patient did stop and now taking this again every day as of this week 10/18/19    Ria Bush, MD  cyclobenzaprine (FLEXERIL) 10 MG tablet Take 1 tablet (10 mg total) by mouth 2 (two) times daily as needed for muscle spasms (sedation precautions). 07/01/19   Ria Bush, MD  dexlansoprazole [facility]) 60 MG capsule TAKE 1 CAPSULE BY MOUTH EVERY DAY 09/18/19   Ria Bush, MD  docusate sodium (COLACE) 100 MG capsule Take 1 capsule (100 mg total) by mouth 2 (two) times daily. Patient taking differently: Take 100 mg by mouth 2 (two) times daily. As needed 07/03/19   Jearld Lesch A, DO  fluticasone (FLONASE) 50 MCG/ACT nasal spray Place into both nostrils daily. As needed    [provider]  gabapentin (NEURONTIN) 300 MG capsule Take 1 capsule (300 mg total) by mouth 3 (three) times daily. 09/18/19   Ria Bush, MD  glucose blood (ONE TOUCH ULTRA TEST) test strip Use to check sugar twice daily and as needed. Dx: E11.65 12/15/17   Ria Bush, MD  ibuprofen (ADVIL,MOTRIN) 600 MG tablet Take 600 mg by mouth daily.    [provider]  Insulin Pen Needle (PEN NEEDLES) 32G X 6 MM MISC 1 each by Does not apply route daily. 12/11/19   Dennard Nip D, MD  lamoTRIgine (LAMICTAL) 200 MG tablet Take 1 tablet (200 mg total) by mouth daily. 01/03/20   Ria Bush, MD  Lancet Devices (ONE TOUCH DELICA LANCING DEV) MISC Use as directed with lancets and one touch meter E11.65 12/15/17   Ria Bush, MD  linaclotide [facility]) 145 MCG CAPS capsule Take 1 capsule (145 mcg total) by mouth daily. 10/14/19   Dennard Nip D, MD  liraglutide (VICTOZA) 18 MG/3ML SOPN Inject 0.2 mLs (1.2 mg total) into the skin daily. 10/07/19   Abby Potash, PA-C  meclizine (ANTIVERT) 12.5 MG tablet Take 12.5 mg by mouth 3 (three) times daily as needed for dizziness.    [provider]  Multiple Vitamin (MULTIVITAMIN) capsule Take 1 capsule by mouth daily.    [provider]  [facility] DELICA LANCETS [id number] MISC Check blood sugar twice daily and as  directed. Dx E11.65 12/01/17   Ria Bush, MD  pioglitazone (ACTOS) 30 MG tablet Take 1 tablet (30 mg total) by mouth daily. 09/18/19   Ria Bush, MD  polyethylene glycol powder [facility]) 17 GM/SCOOP powder Take 17 g by mouth daily. 03/27/19   Abby Potash, PA-C  venlafaxine XR (EFFEXOR XR) 37.5 MG 24 hr capsule Take two capsule for one week and than one capsule daily. Patient taking differently: Take 37.5 mg by mouth daily with breakfast. CURRENTLY TITRATING 11/27/19   Arfeen, Arlyce Harman, MD    Family History Family History  Problem Relation  Age of Onset  . Breast cancer Maternal Grandmother 10  . Dementia Maternal Grandmother   . Heart disease Paternal Grandfather   . Other Sister        Postural Orthostatic Tachycardia Syndrome  . Supraventricular tachycardia Sister   . Other Maternal Aunt        hysterectomy for irregular bleed with abnormal pap  . Hyperlipidemia Mother   . Alzheimer's disease Maternal Grandfather     Social History Social History   Tobacco Use  . Smoking status: Never Smoker  . Smokeless tobacco: Never Used  Substance Use Topics  . Alcohol use: Yes    Alcohol/week: 0.0 standard drinks    Comment: occasional  . Drug use: No     Allergies   Povidone iodine, Biaxin [clarithromycin], and Metformin and related   Review of Systems As per HPI   Physical Exam Triage Vital Signs ED Triage Vitals  Enc Vitals Group     BP      Pulse      Resp      Temp      Temp src      SpO2      Weight      Height      Head Circumference      Peak Flow      Pain Score      Pain Loc      Pain Edu?      Excl. in [place]?    No data found.  Updated Vital Signs BP 119/78 (BP Location: Left Arm) Comment (BP Location): LARGE CUFF  Pulse 97   Temp 98.4 F (36.9 C) (Oral)   Resp 18   SpO2 98%   Visual Acuity Right Eye Distance:   Left Eye Distance:   Bilateral Distance:    Right Eye Near:   Left Eye Near:    Bilateral Near:      Physical Exam Constitutional:      General: She is not in acute distress.    Appearance: She is obese. She is not ill-appearing or diaphoretic.  HENT:     Head: Normocephalic and atraumatic.     Right Ear: Tympanic membrane, ear canal and external ear normal.     Left Ear: Tympanic membrane, ear canal and external ear normal.     Mouth/Throat:     Mouth: Mucous membranes are moist.     Pharynx: Oropharynx is clear.  Eyes:     General: No scleral icterus.    Extraocular Movements: Extraocular movements intact.     Conjunctiva/sclera: Conjunctivae normal.     Pupils: Pupils are equal, round, and reactive to light.  Cardiovascular:     Rate and Rhythm: Normal rate and regular rhythm.  Pulmonary:     Effort: Pulmonary effort is normal. No respiratory distress.     Breath sounds: No wheezing.  Musculoskeletal:        General: No deformity. Normal range of motion.     Cervical back: Normal range of motion. No rigidity or tenderness.  Lymphadenopathy:     Cervical: No cervical adenopathy.  Skin:    Capillary Refill: Capillary refill takes less than 2 seconds.     Coloration: Skin is not jaundiced.     Findings: No bruising or rash.  Neurological:     Mental Status: She is alert.     Cranial Nerves: Cranial nerves are intact.     Sensory: Sensation is intact.     Motor: Motor function is  intact.     Coordination: Coordination is intact.     Gait: Gait is intact.     Comments: Symptoms not reproduced/exacerbated on exam  Psychiatric:        Behavior: Behavior normal.        Thought Content: Thought content normal.        Judgment: Judgment normal.     Comments: Tearful at times, consolable, good eye contact.      UC Treatments / Results  Labs (all labs ordered are listed, but only abnormal results are displayed) Labs Reviewed - No data to display  EKG   Radiology   Procedures Procedures (including critical care time)  Medications Ordered in UC Medications - No  data to display  Initial Impression / Assessment and Plan / UC Course  I have reviewed the triage vital signs and the nursing notes.  Pertinent labs & imaging results that were available during my care of the patient were reviewed by me and considered in my medical decision making (see chart for details).     Patient afebrile, nontoxic in office today.  Patient currently tapering down from venlafaxine, titrating up to Lamictal.  No discernible etiology for patient's dizziness.  No exam findings that are focal or concerning for acute process at this time.  Most apparent to me during time with patient today is significant amount of stress patient has at home, particularly in caregiver role.  Patient denies SI/HI, and is currently looking for a new psychiatrist.  Patient voices concern that psychiatrist who is not a native [place] speaker may lead to her feeling misunderstood/medication issues and sessions.  Regarding this concern and further medication management, I defer to patient's PCP.  Patient requesting I reach out to PCP to "have them call me" to schedule follow-up.  Attempted to do so after discharge: Nurse line busy.  Return precautions discussed, patient verbalized understanding and is agreeable to plan. Final Clinical Impressions(s) / UC Diagnoses   Final diagnoses:  Stress at home  Caregiver burden  Dizziness     Discharge Instructions     Very important to take medications as prescribed. Recommend that you follow-up with your primary care for further evaluation/management if symptoms are persisting as well as to discuss medication changes.    ED Prescriptions    None     PDMP not reviewed this encounter.   Neldon Mc [place], [place] 01/03/20 1933

## 2020-01-01 NOTE — ED Triage Notes (Addendum)
Dizzy and lightheadedness for 3 weeks, intermittent nausea  States psychiatric medicines are being adjusted

## 2020-01-01 NOTE — Telephone Encounter (Signed)
ROI for [facility] obtained via verbal consent from pt, witness [facility] LPN. Medical records to be faxed to provided contact information.

## 2020-01-01 NOTE — Telephone Encounter (Signed)
Pt said has had on and off lightheadedness and nausea for 3-4 wks; if pt is resting she feels OK. If moves around more pt has lightheadedness and nausea. Episodes last from few mins to an hour. Last episode was 12/31/19. Pt feels hot and cold at the same time on and off, feels weak and sweaty. Pt thinks related to BS dropping; pt does not ck BS at these episodes. Pt only ck FBS. Today FBS 151. Last episode of these symptoms were this morning and only lasted few mins. Pt does not eat or drink anything; pt "just waits it out". Pt has had muscle and joint pain in arms and legs; pt cannot say if pain is all the time or comes and goes. Pt has generalized weakness, prod cough with green phlegm for a while (pt cannot pinpoint a time when prod cough with green phlegm started; pt thinks related to pollen.)pt has had loose but not watery stools several times a day for 2-3 days. Pt feels fatigued. Pt has not traveled and to her knowledge has not been exposed to + covid virus in last 14 days. Pt cannot ck BP now. Pt is crying on and off through out the conversation. I asked pt if something was bothering her to make her cry and said that is the way she has been for the last 3-4 days.Marland Kitchen pt then said that she received the [employer] covid vaccine on 12/27/19. Pt had a H/A about 3 days ago with pain level 3-4; could not answer if had H/A since 3 days ago; last abd pain was last night with pain level of 3. On 12/29/19 pt had severe dull rt leg pain that was at pain level of 8. Pt had SOB last night. Pt will go to [facility] on [address]. Pt declined 911 but does not have anyone to drive pt to UC. pt said anyone at work who would drive her will give her a hard time; pt does not want her husband called because he would probably say she is making all this up and there is nothing wrong with pt. Pt said she is not lightheaded now and is short distance from pts work where she is now to [facility] on [address]. If pt begins to feel lightheaded or dizzy she  will pull over and call someone to pick her up. FYI to Dr Darnell Level. Updated immunization list.

## 2020-01-01 NOTE — Discharge Instructions (Addendum)
Very important to take medications as prescribed. Recommend that you follow-up with your primary care for further evaluation/management if symptoms are persisting as well as to discuss medication changes.

## 2020-01-02 NOTE — Telephone Encounter (Signed)
Patient's requesting Lisa Curry return her call.  Patient can be reached at [phone].

## 2020-01-02 NOTE — Telephone Encounter (Signed)
I spoke with pt and she did go to UC on [address] and pt said the PA thought possibly pt being overloaded with stress may be a factor in how she was feeling. Today pt sounds better and pt thought the PA was going to contact Dr Darnell Level. Pt said she needs to schedule a FU UC visit. Pt said the symptoms noted yesterday are resolved or almost gone: pt said has had the prod cough for a long time and thinks the pollen has worsened things.  Dr Darnell Level said OK to see pt in office. Pt scheduled 30' in office UC FU on 04/16/21at 9:30. UC & Ed precautions given and pt voiced understanding.

## 2020-01-03 ENCOUNTER — Ambulatory Visit: Payer: BC Managed Care – PPO | Admitting: Family Medicine

## 2020-01-03 ENCOUNTER — Other Ambulatory Visit: Payer: Self-pay

## 2020-01-03 ENCOUNTER — Ambulatory Visit (INDEPENDENT_AMBULATORY_CARE_PROVIDER_SITE_OTHER)
Admission: RE | Admit: 2020-01-03 | Discharge: 2020-01-03 | Disposition: A | Discharge status: Home / Self Care | Payer: BC Managed Care – PPO | Source: Ambulatory Visit | Attending: Family Medicine | Admitting: Family Medicine

## 2020-01-03 ENCOUNTER — Encounter: Payer: Self-pay | Admitting: Family Medicine

## 2020-01-03 ENCOUNTER — Telehealth: Payer: Self-pay | Admitting: Family Medicine

## 2020-01-03 VITALS — BP 128/72 | HR 100 | Temp 98.0°F | Ht 64.0 in | Wt 271.1 lb

## 2020-01-03 DIAGNOSIS — F331 Major depressive disorder, recurrent, moderate: Secondary | ICD-10-CM

## 2020-01-03 DIAGNOSIS — M5442 Lumbago with sciatica, left side: Secondary | ICD-10-CM

## 2020-01-03 DIAGNOSIS — Z6841 Body Mass Index (BMI) 40.0 and over, adult: Secondary | ICD-10-CM

## 2020-01-03 DIAGNOSIS — Z79899 Other long term (current) drug therapy: Secondary | ICD-10-CM | POA: Diagnosis not present

## 2020-01-03 DIAGNOSIS — M545 Low back pain: Secondary | ICD-10-CM | POA: Diagnosis not present

## 2020-01-03 MED ORDER — LAMOTRIGINE 200 MG PO TABS: 200.0000 mg | ORAL_TABLET | Freq: Every day | ORAL | 3 refills | Status: AC

## 2020-01-03 NOTE — Progress Notes (Signed)
This visit was conducted in person.  BP 128/72 (BP Location: Left Arm, Patient Position: Sitting, Cuff Size: Large)   Pulse 100   Temp 98 F (36.7 C) (Temporal)   Ht 5\' 4"  (1.626 m)   Wt 271 lb 2 oz (123 kg)   LMP 12/01/2019   SpO2 98%   BMI 46.54 kg/m    CC: UCC f/u visit  Subjective:    Patient ID: Lisa Curry, female    DOB: January 07, 1971, 49 y.o.   MRN: [id number]  HPI: Lisa Curry is a 49 y.o. female presenting on 01/03/2020 for Hospitalization Follow-up (Seen at [facility] 01/01/20.   Requests x-ray results be sent to Dr. Suella Broad at [facility]. )   See recent phone note and [facility] eval. UCC note not available.   Ongoing caregiver stress struggle (cares for grandmother with dementia who is verbally abusive - cares for her several nights every other week). Grandmother is going to respite care this coming week. Father also with health issues she is helping with.   Overwhelming feeling leading predominantly to lightheadedness, nausea and diaphoresis for the past week. More lightheaded for the past month. Dizziness described as motion sickness worse with head turns. No room spinning or pre-syncope. Wonders if related to low blood sugars. Last night fell backwards on uneven ground when she got home. Landed on lower back. Some shooting pain down L>R legs, without numbness or paresthesias.   Known L S1 radiculopathy by NCS 06/2018. Has seen PM&R ([facility]).  S/p lumbar laminectomy remotely.   Denies fevers/chills, chest pain, dyspnea, palpitations.  Having trouble tolerating CPAP.  Recent labs from healthy weight and wellness reviewed.   In between psychiatrists at this time - most recently saw Dr Adele Schilder but desired to see an in-person provider (limited availability due to pandemic). New psych appt with Dr Nicolasa Ducking 03/14/2020. She is planning to establish with [facility].   She is on wellbutrin XL 300mg  daily.  Was transitioning off effexor onto lamictal -  plan was to stop effexor this coming month and increase lamictal to 200mg  daily.  Found klonopin overly sedating the next morning so psych suggested she discontinue. She has restarted klonopin recently.   She received Wynetta Emery and [employer] vaccine on 12/27/2019.      Relevant past medical, surgical, family and social history reviewed and updated as indicated. Interim medical history since our last visit reviewed. Allergies and medications reviewed and updated. Outpatient Medications Prior to Visit  Medication Sig Dispense Refill  . acetaminophen-codeine (TYLENOL #3) 300-30 MG tablet Take 1 tablet by mouth every 8 (eight) hours as needed for moderate pain. 15 tablet 0  . Aspirin-Acetaminophen-Caffeine (EXCEDRIN PO) Take 1 capsule by mouth daily. As needed    . atorvastatin (LIPITOR) 20 MG tablet Take 1 tablet (20 mg total) by mouth daily at 6 PM. 90 tablet 3  . buPROPion (WELLBUTRIN XL) 300 MG 24 hr tablet Take 1 tablet (300 mg total) by mouth every morning. 90 tablet 0  . Cholecalciferol (VITAMIN D3) 125 MCG (5000 UT) TABS Take 1 tablet by mouth daily.    . clonazePAM (KLONOPIN) 0.5 MG tablet TAKE 1 TABLET BY MOUTH TWICE A DAY AS NEEDED FOR ANXIETY (Patient taking differently: Provider suggested stopping this.  Patient did stop and now taking this again every day as of this week) 60 tablet 0  . cyclobenzaprine (FLEXERIL) 10 MG tablet Take 1 tablet (10 mg total) by mouth 2 (two) times daily as needed for  muscle spasms (sedation precautions). 30 tablet 1  . dexlansoprazole (DEXILANT) 60 MG capsule TAKE 1 CAPSULE BY MOUTH EVERY DAY 90 capsule 3  . docusate sodium (COLACE) 100 MG capsule Take 1 capsule (100 mg total) by mouth 2 (two) times daily. (Patient taking differently: Take 100 mg by mouth 2 (two) times daily. As needed) 180 capsule 0  . fluticasone (FLONASE) 50 MCG/ACT nasal spray Place into both nostrils daily. As needed    . gabapentin (NEURONTIN) 300 MG capsule Take 1 capsule (300 mg total) by  mouth 3 (three) times daily.    Marland Kitchen glucose blood (ONE TOUCH ULTRA TEST) test strip Use to check sugar twice daily and as needed. Dx: E11.65 300 each 3  . ibuprofen (ADVIL,MOTRIN) 600 MG tablet Take 600 mg by mouth daily.    . Insulin Pen Needle (PEN NEEDLES) 32G X 6 MM MISC 1 each by Does not apply route daily. 100 each 0  . Lancet Devices (ONE TOUCH DELICA LANCING DEV) MISC Use as directed with lancets and one touch meter E11.65 1 each 0  . linaclotide (LINZESS) 145 MCG CAPS capsule Take 1 capsule (145 mcg total) by mouth daily. 30 capsule 0  . meclizine (ANTIVERT) 12.5 MG tablet Take 12.5 mg by mouth 3 (three) times daily as needed for dizziness.    . Multiple Vitamin (MULTIVITAMIN) capsule Take 1 capsule by mouth daily.    Glory Rosebush DELICA LANCETS [id number] MISC Check blood sugar twice daily and as directed. Dx E11.65 200 each 0  . pioglitazone (ACTOS) 30 MG tablet Take 1 tablet (30 mg total) by mouth daily. 90 tablet 3  . polyethylene glycol powder (GLYCOLAX/MIRALAX) 17 GM/SCOOP powder Take 17 g by mouth daily. 3350 g 0  . venlafaxine XR (EFFEXOR XR) 37.5 MG 24 hr capsule Take two capsule for one week and than one capsule daily. (Patient taking differently: Take 37.5 mg by mouth daily with breakfast. CURRENTLY TITRATING) 60 capsule 0  . lamoTRIgine (LAMICTAL) 150 MG tablet Take 1 tablet (150 mg total) by mouth daily. 30 tablet 0  . liraglutide (VICTOZA) 18 MG/3ML SOPN Inject 0.2 mLs (1.2 mg total) into the skin daily. 6 pen 0   Facility-Administered Medications Prior to Visit  Medication Dose Route Frequency Provider Last Rate Last Admin  . diclofenac sodium (VOLTAREN) 1 % transdermal gel 2 g  2 g Topical QID Jessy Oto, MD         Per HPI unless specifically indicated in ROS section below Review of Systems Objective:    BP 128/72 (BP Location: Left Arm, Patient Position: Sitting, Cuff Size: Large)   Pulse 100   Temp 98 F (36.7 C) (Temporal)   Ht 5\' 4"  (1.626 m)   Wt 271 lb 2 oz (123  kg)   LMP 12/01/2019   SpO2 98%   BMI 46.54 kg/m   Wt Readings from Last 3 Encounters:  01/06/20 268 lb (121.6 kg)  01/03/20 271 lb 2 oz (123 kg)  12/10/19 269 lb (122 kg)    Physical Exam Vitals and nursing note reviewed.  Constitutional:      Appearance: Normal appearance. She is obese. She is not ill-appearing.  HENT:     Head: Normocephalic and atraumatic.  Eyes:     Extraocular Movements: Extraocular movements intact.     Pupils: Pupils are equal, round, and reactive to light.  Cardiovascular:     Rate and Rhythm: Normal rate and regular rhythm.     Pulses: Normal pulses.  Heart sounds: Normal heart sounds. No murmur.  Pulmonary:     Effort: Pulmonary effort is normal. No respiratory distress.     Breath sounds: Normal breath sounds. No wheezing, rhonchi or rales.  Musculoskeletal:     Right lower leg: No edema.     Left lower leg: No edema.     Comments:  Midline discomfort at lumbar spine and bilateral paraspinous mm tenderness  Skin:    General: Skin is warm and dry.     Findings: No erythema or rash.  Neurological:     Mental Status: She is alert.  Psychiatric:        Mood and Affect: Mood normal.        Behavior: Behavior normal.       Results for orders placed or performed in visit on 12/10/19  CBC with Differential/Platelet  Result Value Ref Range   WBC 8.6 3.4 - 10.8 x10E3/uL   RBC 4.56 3.77 - 5.28 x10E6/uL   Hemoglobin 13.6 11.1 - 15.9 g/dL   Hematocrit 41.6 34.0 - 46.6 %   MCV 91 79 - 97 fL   MCH 29.8 26.6 - 33.0 pg   MCHC 32.7 31.5 - 35.7 g/dL   RDW 13.4 11.7 - 15.4 %   Platelets 292 150 - 450 x10E3/uL   Neutrophils 68 Not Estab. %   Lymphs 26 Not Estab. %   Monocytes 4 Not Estab. %   Eos 2 Not Estab. %   Basos 0 Not Estab. %   Neutrophils Absolute 5.8 1.4 - 7.0 x10E3/uL   Lymphocytes Absolute 2.2 0.7 - 3.1 x10E3/uL   Monocytes Absolute 0.3 0.1 - 0.9 x10E3/uL   EOS (ABSOLUTE) 0.2 0.0 - 0.4 x10E3/uL   Basophils Absolute 0.0 0.0 - 0.2  x10E3/uL   Immature Granulocytes 0 Not Estab. %   Immature Grans (Abs) 0.0 0.0 - 0.1 x10E3/uL  Comprehensive metabolic panel  Result Value Ref Range   Glucose 86 65 - 99 mg/dL   BUN 10 6 - 24 mg/dL   Creatinine, Ser 0.84 0.57 - 1.00 mg/dL   GFR calc non Af Amer 82 >59 mL/min/1.73   GFR calc Af Amer 94 >59 mL/min/1.73   BUN/Creatinine Ratio 12 9 - 23   Sodium 142 134 - 144 mmol/L   Potassium 4.1 3.5 - 5.2 mmol/L   Chloride 103 96 - 106 mmol/L   CO2 23 20 - 29 mmol/L   Calcium 9.4 8.7 - 10.2 mg/dL   Total Protein 6.8 6.0 - 8.5 g/dL   Albumin 4.5 3.8 - 4.8 g/dL   Globulin, Total 2.3 1.5 - 4.5 g/dL   Albumin/Globulin Ratio 2.0 1.2 - 2.2   Bilirubin Total 0.4 0.0 - 1.2 mg/dL   Alkaline Phosphatase 113 39 - 117 IU/L   AST 22 0 - 40 IU/L   ALT 17 0 - 32 IU/L  Folate  Result Value Ref Range   Folate 19.1 >3.0 ng/mL  Hemoglobin A1c  Result Value Ref Range   Hgb A1c MFr Bld 6.2 (H) 4.8 - 5.6 %   Est. average glucose Bld gHb Est-mCnc 131 mg/dL  Insulin, random  Result Value Ref Range   INSULIN 9.4 2.6 - 24.9 uIU/mL  Lipid Panel With LDL/HDL Ratio  Result Value Ref Range   Cholesterol, Total 121 100 - 199 mg/dL   Triglycerides 108 0 - 149 mg/dL   HDL 44 >39 mg/dL   VLDL Cholesterol Cal 20 5 - 40 mg/dL   LDL Chol Calc (NIH)  57 0 - 99 mg/dL   LDL/HDL Ratio 1.3 0.0 - 3.2 ratio  T3  Result Value Ref Range   T3, Total 108 71 - 180 ng/dL  T4, free  Result Value Ref Range   Free T4 0.97 0.82 - 1.77 ng/dL  TSH  Result Value Ref Range   TSH 2.330 0.450 - 4.500 uIU/mL  Vitamin B12  Result Value Ref Range   Vitamin B-12 688 232 - 1,245 pg/mL  VITAMIN D 25 Hydroxy (Vit-D Deficiency, Fractures)  Result Value Ref Range   Vit D, 25-Hydroxy 62.0 30.0 - 100.0 ng/mL   DG Lumbar Spine Complete CLINICAL DATA:  Mid lumbar back pain after fall  EXAM: LUMBAR SPINE - COMPLETE 4+ VIEW  COMPARISON:  2018  FINDINGS: Slight dextrocurvature persists. Stable vertebral body heights. No acute  fracture identified. Alignment is unchanged. There is multilevel disc space narrowing and endplate osteophytes. Disc space loss is greatest at L5-S1 as before. There is facet hypertrophy. No evidence of spondylolysis.  IMPRESSION: No acute fracture.  Stable appearance of degenerative changes.  Electronically Signed   By: Macy Mis M.D.   On: 01/03/2020 15:47   Depression screen [facility] 2/9 01/03/2020 09/18/2019 10/23/2018 10/08/2018 09/20/2018  Decreased Interest 3 2 2 2 1   Down, Depressed, Hopeless 1 1 1 1 1   PHQ - 2 Score 4 3 3 3 2   Altered sleeping 1 2 1  0 0  Tired, decreased energy 2 3 3 3 1   Change in appetite 3 3 2 2  0  Feeling bad or failure about yourself  2 0 0 1 0  Trouble concentrating 1 2 2 1  0  Moving slowly or fidgety/restless 1 1 1  0 0  Suicidal thoughts 0 0 0 0 0  PHQ-9 Score 14 14 12 10 3   Difficult doing work/chores - - - - -  Some recent data might be hidden    GAD 7 : Generalized Anxiety Score 01/03/2020 09/18/2019 10/23/2018 10/08/2018  Nervous, Anxious, on Edge 3 1 2  0  Control/stop worrying 0 0 1 0  Worry too much - different things 1 1 0 0  Trouble relaxing 3 1 1 1   Restless 1 0 0 0  Easily annoyed or irritable 3 2 1 1   Afraid - awful might happen 0 0 1 0  Total GAD 7 Score 11 5 6 2   Anxiety Difficulty - - Somewhat difficult Not difficult at all   Assessment & Plan:  This visit occurred during the SARS-CoV-2 public health emergency.  Safety protocols were in place, including screening questions prior to the visit, additional usage of staff PPE, and extensive cleaning of exam room while observing appropriate contact time as indicated for disinfecting solutions.   Problem List Items Addressed This Visit    Morbid obesity with BMI of 45.0-49.9, adult ([place])    Contributes to chronic back pain.       MDD (major depressive disorder), recurrent episode, moderate (HCC) - Primary    Chronic, poor control, significant caregiver and other family stressors  contribute. Has been seeing psych through telehealth, but wants to see in-person psychiatrist - pending transition to new psychiatrist.  Reviewed previous psych plan to transition off effexor onto lamictal - will continue cross taper pending new psychiatrist appointment. She is currently on wellbutrin XL 300mg  daily along with effexor taper (75mg  x 1 wk then 37.38mmg x1 wk then stop) and lamictal 150mg  daily. Reviewed klonopin use.  Reviewed need to monitor labwork as we titrate lamictal -  she will return 1 wk after increasing lamictal dose for f/u labs.       Relevant Medications   lamoTRIgine (LAMICTAL) 200 MG tablet   Other Relevant Orders   Comprehensive metabolic panel   CBC with Differential/Platelet   Lower back pain    Chronic, worse after fall onto lower back a few days ago - will update lumbar films.  Discussed tylenol #3 use along with her flexeril, ice/heat.  Discussed return to ortho if ongoing pain.       Relevant Orders   DG Lumbar Spine Complete (Completed)    Other Visit Diagnoses    Medication management       Relevant Orders   Comprehensive metabolic panel   CBC with Differential/Platelet       Meds ordered this encounter  Medications  . lamoTRIgine (LAMICTAL) 200 MG tablet    Sig: Take 1 tablet (200 mg total) by mouth daily.    Dispense:  30 tablet    Refill:  3    Increase to 200mg  starting next month   Orders Placed This Encounter  Procedures  . DG Lumbar Spine Complete    Standing Status:   Future    Number of Occurrences:   1    Standing Expiration Date:   03/04/2021    Order Specific Question:   Reason for Exam (SYMPTOM  OR DIAGNOSIS REQUIRED)    Answer:   mid lumbar back pain after fall    Order Specific Question:   Is patient pregnant?    Answer:   No    Order Specific Question:   Preferred imaging location?    Answer:   [facility]    Order Specific Question:   Radiology Contrast Protocol - do NOT remove file path    Answer:    [email or web address]  . Comprehensive metabolic panel    Standing Status:   Future    Standing Expiration Date:   01/10/2021  . CBC with Differential/Platelet    Standing Status:   Future    Standing Expiration Date:   01/10/2021    Patient Instructions  When you run out of lamictal 150mg , increase to 200mg  daily. New dose at pharmacy.  Once you finish effexor XR, stop it.  Continue wellbutrin at current dose. Ok to continue klonopin 0.5mg  as needed for anxiety. Watch for sedation, try 1/2 tab at a time if this happens.  Lower back xray today. If ongoing back pain, call ortho. For now, continue flexeril, ice and heating pad to the back, ok to try tylenol #3 for breakthrough pain.  I'm glad you have an appointment with Dr Nicolasa Ducking. Return next month 1 week after starting higher lamictal dose for fasting labs.     Follow up plan: Return in about 4 weeks (around 01/31/2020), or if symptoms worsen or fail to improve, for follow up visit.  Ria Bush, MD

## 2020-01-03 NOTE — Telephone Encounter (Signed)
Noted  

## 2020-01-03 NOTE — Patient Instructions (Addendum)
When you run out of lamictal 150mg , increase to 200mg  daily. New dose at pharmacy.  Once you finish effexor XR, stop it.  Continue wellbutrin at current dose. Ok to continue klonopin 0.5mg  as needed for anxiety. Watch for sedation, try 1/2 tab at a time if this happens.  Lower back xray today. If ongoing back pain, call ortho. For now, continue flexeril, ice and heating pad to the back, ok to try tylenol #3 for breakthrough pain.  I'm glad you have an appointment with Dr Nicolasa Ducking. Return next month 1 week after starting higher lamictal dose for fasting labs.

## 2020-01-03 NOTE — Telephone Encounter (Signed)
Please fax xray reports from todays visit to Dr Suella Broad at [facility].   Can be routed through Epic - he has been added to her care team in [place]  Thanks!

## 2020-01-03 NOTE — Telephone Encounter (Signed)
Seen today. 

## 2020-01-06 ENCOUNTER — Ambulatory Visit (INDEPENDENT_AMBULATORY_CARE_PROVIDER_SITE_OTHER): Payer: BC Managed Care – PPO | Admitting: Family Medicine

## 2020-01-06 ENCOUNTER — Encounter (INDEPENDENT_AMBULATORY_CARE_PROVIDER_SITE_OTHER): Payer: Self-pay | Admitting: Family Medicine

## 2020-01-06 ENCOUNTER — Other Ambulatory Visit: Payer: Self-pay

## 2020-01-06 VITALS — BP 116/77 | HR 107 | Temp 98.0°F | Ht 64.0 in | Wt 268.0 lb

## 2020-01-06 DIAGNOSIS — Z9189 Other specified personal risk factors, not elsewhere classified: Secondary | ICD-10-CM

## 2020-01-06 DIAGNOSIS — R5383 Other fatigue: Secondary | ICD-10-CM

## 2020-01-06 DIAGNOSIS — E119 Type 2 diabetes mellitus without complications: Secondary | ICD-10-CM

## 2020-01-06 DIAGNOSIS — Z6841 Body Mass Index (BMI) 40.0 and over, adult: Secondary | ICD-10-CM

## 2020-01-06 MED ORDER — VICTOZA 18 MG/3ML ~~LOC~~ SOPN: 1.2000 mg | PEN_INJECTOR | Freq: Every day | SUBCUTANEOUS | 0 refills | Status: DC

## 2020-01-07 ENCOUNTER — Ambulatory Visit (INDEPENDENT_AMBULATORY_CARE_PROVIDER_SITE_OTHER): Payer: BC Managed Care – PPO | Admitting: Family Medicine

## 2020-01-07 NOTE — Progress Notes (Signed)
Chief Complaint:   OBESITY Lisa Curry is here to discuss her progress with her obesity treatment plan along with follow-up of her obesity related diagnoses. Lisa Curry is on the Category 2 Plan + 100 calories and states she is following her eating plan approximately 50% of the time. Lisa Curry states she is doing 0 minutes 0 times per week.  Today's visit was #: 24 Starting weight: 279 lbs Starting date: 07/26/2018 Today's weight: 268 lbs Today's date: 01/06/2020 Total lbs lost to date: 11 Total lbs lost since last in-office visit: 1  Interim History: Lisa Curry continues to lose weight but slowly. She is not able to follow her plan closely and has minimal family support which makes things more difficult.  Subjective:   1. Type 2 diabetes mellitus without complication, with long-term current use of insulin ([place]) Lisa Curry's last A1c was 6.2. Her diabetes mellitus is well controlled on Victoza, and she is working diet and weight loss. I discussed labs with the patient today.  2. Caregiver with fatigue Lisa Curry is under a lot of stress dealing with her elderly grandmother, and she is not taking as good care of herself.  3. At risk for heart disease Lisa Curry is at a higher than average risk for cardiovascular disease due to obesity stress.   Assessment/Plan:   1. Type 2 diabetes mellitus without complication, with long-term current use of insulin (HCC) Good blood sugar control is important to decrease the likelihood of diabetic complications such as nephropathy, neuropathy, limb loss, blindness, coronary artery disease, and death. Intensive lifestyle modification including diet, exercise and weight loss are the first line of treatment for diabetes. We will refill Victoza for 1 month, and she will continue with diet and exercise.  - liraglutide (VICTOZA) 18 MG/3ML SOPN; Inject 0.2 mLs (1.2 mg total) into the skin daily.  Dispense: 6 pen; Refill: 0  2. Caregiver with fatigue Lisa Curry was educated on the importance of  self-care and ways to help take time for herself.  3. At risk for heart disease Lisa Curry was given approximately 15 minutes of coronary artery disease prevention counseling today. She is 49 y.o. female and has risk factors for heart disease including obesity. We discussed intensive lifestyle modifications today with an emphasis on specific weight loss instructions and strategies.   Repetitive spaced learning was employed today to elicit superior memory formation and behavioral change.  4. Class 3 severe obesity with serious comorbidity and body mass index (BMI) of 45.0 to 49.9 in adult, unspecified obesity type [facility]) Lisa Curry is currently in the action stage of change. As such, her goal is to continue with weight loss efforts. She has agreed to the Category 2 Plan.   Behavioral modification strategies: meal planning and cooking strategies and dealing with family or coworker sabotage.  Lisa Curry has agreed to follow-up with our clinic in 3 weeks. She was informed of the importance of frequent follow-up visits to maximize her success with intensive lifestyle modifications for her multiple health conditions.   Objective:   Blood pressure 116/77, pulse (!) 107, temperature 98 F (36.7 C), temperature source Oral, height 5\' 4"  (1.626 m), weight 268 lb (121.6 kg), last menstrual period 01/06/2020, SpO2 100 %. Body mass index is 46 kg/m.  General: Cooperative, alert, well developed, in no acute distress. HEENT: Conjunctivae and lids unremarkable. Cardiovascular: Regular rhythm.  Lungs: Normal work of breathing. Neurologic: No focal deficits.   Lab Results  Component Value Date   CREATININE 0.84 12/10/2019   BUN 10 12/10/2019  NA 142 12/10/2019   K 4.1 12/10/2019   CL 103 12/10/2019   CO2 23 12/10/2019   Lab Results  Component Value Date   ALT 17 12/10/2019   AST 22 12/10/2019   ALKPHOS 113 12/10/2019   BILITOT 0.4 12/10/2019   Lab Results  Component Value Date   HGBA1C 6.2 (H) 12/10/2019    HGBA1C 6.2 (H) 08/01/2019   HGBA1C 5.9 (H) 04/22/2019   HGBA1C 5.9 (H) 11/28/2018   HGBA1C 5.6 08/24/2018   Lab Results  Component Value Date   INSULIN 9.4 12/10/2019   INSULIN 9.8 11/28/2018   INSULIN 20.0 07/26/2018   Lab Results  Component Value Date   TSH 2.330 12/10/2019   Lab Results  Component Value Date   CHOL 121 12/10/2019   HDL 44 12/10/2019   LDLCALC 57 12/10/2019   LDLDIRECT 73.0 04/18/2017   TRIG 108 12/10/2019   CHOLHDL 3 05/16/2018   Lab Results  Component Value Date   WBC 8.6 12/10/2019   HGB 13.6 12/10/2019   HCT 41.6 12/10/2019   MCV 91 12/10/2019   PLT 292 12/10/2019   Lab Results  Component Value Date   IRON 62 05/30/2016   FERRITIN 55.2 05/30/2016   Attestation Statements:   Reviewed by clinician on day of visit: allergies, medications, problem list, medical history, surgical history, family history, social history, and previous encounter notes.   I, Trixie Dredge, am acting as transcriptionist for Dennard Nip, MD.  I have reviewed the above documentation for accuracy and completeness, and I agree with the above. -  Dennard Nip, MD

## 2020-01-08 ENCOUNTER — Encounter: Payer: Self-pay | Admitting: Adult Health

## 2020-01-11 NOTE — Assessment & Plan Note (Addendum)
Chronic, worse after fall onto lower back a few days ago - will update lumbar films.  Discussed tylenol #3 use along with her flexeril, ice/heat.  Discussed return to ortho if ongoing pain.

## 2020-01-11 NOTE — Assessment & Plan Note (Signed)
Contributes to chronic back pain.  

## 2020-01-11 NOTE — Assessment & Plan Note (Addendum)
Chronic, poor control, significant caregiver and other family stressors contribute. Has been seeing psych through telehealth, but wants to see in-person psychiatrist - pending transition to new psychiatrist.  Reviewed previous psych plan to transition off effexor onto lamictal - will continue cross taper pending new psychiatrist appointment. She is currently on wellbutrin XL 300mg  daily along with effexor taper (75mg  x 1 wk then 37.20mmg x1 wk then stop) and lamictal 150mg  daily. Reviewed klonopin use.  Reviewed need to monitor labwork as we titrate lamictal - she will return 1 wk after increasing lamictal dose for f/u labs.

## 2020-01-13 ENCOUNTER — Ambulatory Visit (HOSPITAL_COMMUNITY): Payer: BC Managed Care – PPO | Admitting: Psychiatry

## 2020-01-22 ENCOUNTER — Encounter: Payer: Self-pay | Admitting: Family Medicine

## 2020-01-24 DIAGNOSIS — G4733 Obstructive sleep apnea (adult) (pediatric): Secondary | ICD-10-CM | POA: Diagnosis not present

## 2020-01-24 NOTE — Telephone Encounter (Signed)
Replied via mychart. plz notify - prednisone will likely elevate sugar levels, however I think it's ok to come in Monday still for labs.

## 2020-01-24 NOTE — Telephone Encounter (Signed)
Patient called back about lab appointment  Patient stated she is on prednisone and would like to know if this will effect the labs on Monday 5/10   If so, patient would like to know how long she should wait before having labs done   Patient would like a call back before the end of today so she will know what to do

## 2020-01-27 ENCOUNTER — Other Ambulatory Visit: Payer: Self-pay

## 2020-01-27 ENCOUNTER — Telehealth: Payer: Self-pay | Admitting: Radiology

## 2020-01-27 ENCOUNTER — Other Ambulatory Visit (INDEPENDENT_AMBULATORY_CARE_PROVIDER_SITE_OTHER): Payer: BC Managed Care – PPO

## 2020-01-27 DIAGNOSIS — Z79899 Other long term (current) drug therapy: Secondary | ICD-10-CM | POA: Diagnosis not present

## 2020-01-27 DIAGNOSIS — F331 Major depressive disorder, recurrent, moderate: Secondary | ICD-10-CM | POA: Diagnosis not present

## 2020-01-27 LAB — CBC WITH DIFFERENTIAL/PLATELET
Basophils Absolute: 0 10*3/uL (ref 0.0–0.1)
Basophils Relative: 0.5 % (ref 0.0–3.0)
Eosinophils Absolute: 0.3 10*3/uL (ref 0.0–0.7)
Eosinophils Relative: 3 % (ref 0.0–5.0)
HCT: 40.8 % (ref 36.0–46.0)
Hemoglobin: 13.4 g/dL (ref 12.0–15.0)
Lymphocytes Relative: 29.2 % (ref 12.0–46.0)
Lymphs Abs: 2.9 10*3/uL (ref 0.7–4.0)
MCHC: 32.8 g/dL (ref 30.0–36.0)
MCV: 91.1 fl (ref 78.0–100.0)
Monocytes Absolute: 0.6 10*3/uL (ref 0.1–1.0)
Monocytes Relative: 5.9 % (ref 3.0–12.0)
Neutro Abs: 6.1 10*3/uL (ref 1.4–7.7)
Neutrophils Relative %: 61.4 % (ref 43.0–77.0)
Platelets: 279 10*3/uL (ref 150.0–400.0)
RBC: 4.48 Mil/uL (ref 3.87–5.11)
RDW: 15.5 % (ref 11.5–15.5)
WBC: 9.8 10*3/uL (ref 4.0–10.5)

## 2020-01-27 LAB — COMPREHENSIVE METABOLIC PANEL
ALT: 20 U/L (ref 0–35)
AST: 20 U/L (ref 0–37)
Albumin: 4 g/dL (ref 3.5–5.2)
Alkaline Phosphatase: 88 U/L (ref 39–117)
BUN: 10 mg/dL (ref 6–23)
CO2: 31 mEq/L (ref 19–32)
Calcium: 8.9 mg/dL (ref 8.4–10.5)
Chloride: 100 mEq/L (ref 96–112)
Creatinine, Ser: 0.78 mg/dL (ref 0.40–1.20)
GFR: 78.24 mL/min (ref >60.00)
Glucose, Bld: 123 mg/dL — ABNORMAL HIGH (ref 70–99)
Potassium: 3.9 mEq/L (ref 3.5–5.1)
Sodium: 138 mEq/L (ref 135–145)
Total Bilirubin: 0.4 mg/dL (ref 0.2–1.2)
Total Protein: 6.6 g/dL (ref 6.0–8.3)

## 2020-01-27 NOTE — Telephone Encounter (Signed)
Noted. Thanks.

## 2020-01-27 NOTE — Telephone Encounter (Signed)
Patient came in for labs, she wanted you to be aware that she just tapered off steroids last week and it did effect he WBC in the past

## 2020-01-29 ENCOUNTER — Other Ambulatory Visit: Payer: Self-pay

## 2020-01-29 ENCOUNTER — Ambulatory Visit (INDEPENDENT_AMBULATORY_CARE_PROVIDER_SITE_OTHER): Payer: BC Managed Care – PPO | Admitting: Family Medicine

## 2020-01-29 ENCOUNTER — Encounter (INDEPENDENT_AMBULATORY_CARE_PROVIDER_SITE_OTHER): Payer: Self-pay | Admitting: Family Medicine

## 2020-01-29 VITALS — BP 122/71 | HR 95 | Temp 98.1°F | Ht 64.0 in | Wt 270.0 lb

## 2020-01-29 DIAGNOSIS — Z6841 Body Mass Index (BMI) 40.0 and over, adult: Secondary | ICD-10-CM

## 2020-01-29 DIAGNOSIS — E119 Type 2 diabetes mellitus without complications: Secondary | ICD-10-CM | POA: Diagnosis not present

## 2020-01-29 MED ORDER — VICTOZA 18 MG/3ML ~~LOC~~ SOPN: 1.8000 mg | PEN_INJECTOR | Freq: Every day | SUBCUTANEOUS | 0 refills | Status: DC

## 2020-01-29 NOTE — Progress Notes (Signed)
Chief Complaint:   OBESITY Lisa Curry is here to discuss her progress with her obesity treatment plan along with follow-up of her obesity related diagnoses. Lisa Curry is on the Category 2 Plan and states she is following her eating plan approximately 0% of the time. Lisa Curry states her activity level has increased.  Today's visit was #: 25 Starting weight: 279 lbs Starting date: 07/26/2018 Today's weight: 270 lbs Today's date: 01/29/2020 Total lbs lost to date: 9 Total lbs lost since last in-office visit: 0  Interim History: Lisa Curry has been off track with her eating plan with increased family stress and decreased meal planning. She is having to do more grab and go eating, and I suspect she is not meeting her protein goals and her RMR has decreased.  Subjective:   1. Type 2 diabetes mellitus without complication, without long-term current use of insulin ([place]) Lisa Curry still notes polyphagia on Victoza, and she is tolerating the 1.2 mg dose well. She denies nausea or vomiting.  Assessment/Plan:   1. Type 2 diabetes mellitus without complication, without long-term current use of insulin ([facility]) Good blood sugar control is important to decrease the likelihood of diabetic complications such as nephropathy, neuropathy, limb loss, blindness, coronary artery disease, and death. Intensive lifestyle modification including diet, exercise and weight loss are the first line of treatment for diabetes. Lisa Curry agreed to increase Victoza to 1.8 mg #9 pens, and we will refill for 90 days with no refills. We will recheck labs in 1-2 months.  - liraglutide (VICTOZA) 18 MG/3ML SOPN; Inject 0.3 mLs (1.8 mg total) into the skin daily.  Dispense: 9 pen; Refill: 0  2. Class 3 severe obesity with serious comorbidity and body mass index (BMI) of 45.0 to 49.9 in adult, unspecified obesity type [facility]) Lisa Curry is currently in the action stage of change. As such, her goal is to continue with weight loss efforts. She has agreed to the  Category 2 Plan or keeping a food journal and adhering to recommended goals of 1100-1300 calories and 75+ grams of protein daily.   Exercise goals: As is.  Behavioral modification strategies: increasing lean protein intake.  Lisa Curry has agreed to follow-up with our clinic in 3 to 4 weeks. She was informed of the importance of frequent follow-up visits to maximize her success with intensive lifestyle modifications for her multiple health conditions.   Objective:   Blood pressure 122/71, pulse 95, temperature 98.1 F (36.7 C), temperature source Oral, height 5\' 4"  (1.626 m), weight 270 lb (122.5 kg), last menstrual period 01/06/2020, SpO2 98 %. Body mass index is 46.35 kg/m.  General: Cooperative, alert, well developed, in no acute distress. HEENT: Conjunctivae and lids unremarkable. Cardiovascular: Regular rhythm.  Lungs: Normal work of breathing. Neurologic: No focal deficits.   Lab Results  Component Value Date   CREATININE 0.78 01/27/2020   BUN 10 01/27/2020   NA 138 01/27/2020   K 3.9 01/27/2020   CL 100 01/27/2020   CO2 31 01/27/2020   Lab Results  Component Value Date   ALT 20 01/27/2020   AST 20 01/27/2020   ALKPHOS 88 01/27/2020   BILITOT 0.4 01/27/2020   Lab Results  Component Value Date   HGBA1C 6.2 (H) 12/10/2019   HGBA1C 6.2 (H) 08/01/2019   HGBA1C 5.9 (H) 04/22/2019   HGBA1C 5.9 (H) 11/28/2018   HGBA1C 5.6 08/24/2018   Lab Results  Component Value Date   INSULIN 9.4 12/10/2019   INSULIN 9.8 11/28/2018   INSULIN 20.0 07/26/2018  Lab Results  Component Value Date   TSH 2.330 12/10/2019   Lab Results  Component Value Date   CHOL 121 12/10/2019   HDL 44 12/10/2019   LDLCALC 57 12/10/2019   LDLDIRECT 73.0 04/18/2017   TRIG 108 12/10/2019   CHOLHDL 3 05/16/2018   Lab Results  Component Value Date   WBC 9.8 01/27/2020   HGB 13.4 01/27/2020   HCT 40.8 01/27/2020   MCV 91.1 01/27/2020   PLT 279.0 01/27/2020   Lab Results  Component Value Date     IRON 62 05/30/2016   FERRITIN 55.2 05/30/2016   Attestation Statements:   Reviewed by clinician on day of visit: allergies, medications, problem list, medical history, surgical history, family history, social history, and previous encounter notes.  Time spent on visit including pre-visit chart review and post-visit care and charting was 30 minutes.    I, Trixie Dredge, am acting as transcriptionist for Dennard Nip, MD.  I have reviewed the above documentation for accuracy and completeness, and I agree with the above. -  Dennard Nip, MD

## 2020-01-29 NOTE — Telephone Encounter (Signed)
Please advise 

## 2020-02-04 DIAGNOSIS — F5105 Insomnia due to other mental disorder: Secondary | ICD-10-CM | POA: Diagnosis not present

## 2020-02-04 DIAGNOSIS — F39 Unspecified mood [affective] disorder: Secondary | ICD-10-CM | POA: Diagnosis not present

## 2020-02-04 DIAGNOSIS — F411 Generalized anxiety disorder: Secondary | ICD-10-CM | POA: Diagnosis not present

## 2020-02-10 ENCOUNTER — Other Ambulatory Visit (HOSPITAL_COMMUNITY): Payer: Self-pay | Admitting: Psychiatry

## 2020-02-10 DIAGNOSIS — F331 Major depressive disorder, recurrent, moderate: Secondary | ICD-10-CM

## 2020-02-10 DIAGNOSIS — F419 Anxiety disorder, unspecified: Secondary | ICD-10-CM

## 2020-02-18 ENCOUNTER — Encounter: Payer: Self-pay | Admitting: Family Medicine

## 2020-02-19 ENCOUNTER — Ambulatory Visit (INDEPENDENT_AMBULATORY_CARE_PROVIDER_SITE_OTHER): Payer: BC Managed Care – PPO | Admitting: Family Medicine

## 2020-02-24 ENCOUNTER — Ambulatory Visit: Payer: BC Managed Care – PPO | Admitting: Family Medicine

## 2020-02-24 ENCOUNTER — Encounter: Payer: Self-pay | Admitting: Family Medicine

## 2020-02-24 DIAGNOSIS — G4733 Obstructive sleep apnea (adult) (pediatric): Secondary | ICD-10-CM | POA: Diagnosis not present

## 2020-02-25 DIAGNOSIS — F5105 Insomnia due to other mental disorder: Secondary | ICD-10-CM | POA: Diagnosis not present

## 2020-02-25 DIAGNOSIS — F39 Unspecified mood [affective] disorder: Secondary | ICD-10-CM | POA: Diagnosis not present

## 2020-02-25 DIAGNOSIS — F411 Generalized anxiety disorder: Secondary | ICD-10-CM | POA: Diagnosis not present

## 2020-02-27 MED ORDER — NYSTATIN 100000 UNIT/GM EX CREA: 1.0000 "application " | TOPICAL_CREAM | Freq: Two times a day (BID) | CUTANEOUS | 1 refills | Status: DC

## 2020-02-27 MED ORDER — NYSTATIN 100000 UNIT/GM EX POWD
1.0000 | Freq: Two times a day (BID) | CUTANEOUS | 0 refills | Status: DC
Start: 2020-02-27 — End: 2020-03-06

## 2020-02-27 NOTE — Telephone Encounter (Signed)
Pt left v/m with same info as my chart note; pt is almost out of Nystatin and request cb 02/27/20.

## 2020-02-27 NOTE — Telephone Encounter (Signed)
See my chart message

## 2020-03-02 ENCOUNTER — Encounter: Payer: Self-pay | Admitting: Family Medicine

## 2020-03-06 MED ORDER — NYSTATIN 100000 UNIT/GM EX POWD: 1.0000 "application " | Freq: Two times a day (BID) | CUTANEOUS | 1 refills | Status: DC

## 2020-03-06 NOTE — Telephone Encounter (Signed)
Powder refilled. Agree with powder and cream use separately, pat area dry don't rub, may use gauze if anticipating going to sweat more.

## 2020-03-06 NOTE — Telephone Encounter (Signed)
Pt called back with update; pt said the area is feeling better and less odor; worsened due to pt shaving the area. Advised pt until area healed not to shave that area. Pt wants to know if can get refill on nystatin powder. Pt is using powder and cream at same time and area is still getting slimy. I advised pt to use the cream and powder at separate times. Pt is to use gauze to dry area before applying cream or powder. May be helpful to pat area dry rather than rubbing area while still irritated.I advised pt to try and use powder if pt can lay down and allow the area to be dry and get some air and used the cream when up and around. Pt voiced understanding and will try that. Pt wants to know if should use gauze in the crease all the time even after applying cream or powder or what else can DR G advise pt to do. [facility].

## 2020-03-06 NOTE — Addendum Note (Signed)
Addended by: Helene Shoe on: 03/06/2020 10:58 AM   Modules accepted: Orders

## 2020-03-08 ENCOUNTER — Encounter: Payer: Self-pay | Admitting: Family Medicine

## 2020-03-09 ENCOUNTER — Other Ambulatory Visit: Payer: Self-pay

## 2020-03-09 ENCOUNTER — Encounter (INDEPENDENT_AMBULATORY_CARE_PROVIDER_SITE_OTHER): Payer: Self-pay | Admitting: Family Medicine

## 2020-03-09 ENCOUNTER — Ambulatory Visit (INDEPENDENT_AMBULATORY_CARE_PROVIDER_SITE_OTHER): Payer: BC Managed Care – PPO | Admitting: Family Medicine

## 2020-03-09 VITALS — BP 136/68 | HR 85 | Temp 98.3°F | Ht 64.0 in | Wt 274.0 lb

## 2020-03-09 DIAGNOSIS — E119 Type 2 diabetes mellitus without complications: Secondary | ICD-10-CM

## 2020-03-09 DIAGNOSIS — E1169 Type 2 diabetes mellitus with other specified complication: Secondary | ICD-10-CM

## 2020-03-09 DIAGNOSIS — Z9189 Other specified personal risk factors, not elsewhere classified: Secondary | ICD-10-CM | POA: Diagnosis not present

## 2020-03-09 DIAGNOSIS — Z1231 Encounter for screening mammogram for malignant neoplasm of breast: Secondary | ICD-10-CM | POA: Diagnosis not present

## 2020-03-09 DIAGNOSIS — E785 Hyperlipidemia, unspecified: Secondary | ICD-10-CM

## 2020-03-09 DIAGNOSIS — Z6841 Body Mass Index (BMI) 40.0 and over, adult: Secondary | ICD-10-CM

## 2020-03-09 LAB — HM MAMMOGRAPHY

## 2020-03-09 MED ORDER — VICTOZA 18 MG/3ML ~~LOC~~ SOPN: 1.8000 mg | PEN_INJECTOR | Freq: Every day | SUBCUTANEOUS | 0 refills | Status: DC

## 2020-03-09 NOTE — Telephone Encounter (Signed)
Pt left v/m that ins advised too soon to refill nystatin powder and would not cover med cost at this time and pt request PA be done so ins will pay for nystatin powder and pt can get refill now. FYI to [place] CMA.

## 2020-03-10 ENCOUNTER — Encounter (INDEPENDENT_AMBULATORY_CARE_PROVIDER_SITE_OTHER): Payer: Self-pay | Admitting: Family Medicine

## 2020-03-10 DIAGNOSIS — E119 Type 2 diabetes mellitus without complications: Secondary | ICD-10-CM

## 2020-03-11 MED ORDER — VICTOZA 18 MG/3ML ~~LOC~~ SOPN: 1.8000 mg | PEN_INJECTOR | Freq: Every day | SUBCUTANEOUS | 0 refills | Status: DC

## 2020-03-11 NOTE — Progress Notes (Signed)
Chief Complaint:   OBESITY Lisa Curry is here to discuss her progress with her obesity treatment plan along with follow-up of her obesity related diagnoses. Lisa Curry is on the Category 2 Plan or keeping a food journal and adhering to recommended goals of 1100-1300 calories and 75+ grams of protein daily and states she is following her eating plan approximately 0% of the time. Lisa Curry states she is doing 0 minutes 0 times per week.  Today's visit was #: 26 Starting weight: 279 lbs Starting date: 07/26/2018 Today's weight: 274 lbs Today's date: 03/09/2020 Total lbs lost to date: 5 Total lbs lost since last in-office visit: 0  Interim History: Lisa Curry has been off track with family stress. Her father just had an myocardial infarction and multiple stents placed, but he is doing well. She is concerned about her family's history of coronal artery disease and she is ready to get back on track with diet, exercise, and weight loss.  Subjective:   1. Type 2 diabetes mellitus without complication, without long-term current use of insulin (Lisa Curry) Lisa Curry's last A1c was 6.2. She is stable on Victoza at 1.8 mg but a pharmacy error happened and she is worried she will run out of medicine early.  2. Hyperlipidemia associated with type 2 diabetes mellitus (Lisa Curry) Lisa Curry is on statin and she has questions about additional tests to stratify her coronal artery disease risk. Her primary care physician has suggested a lipoprotein or cardiac CT.  3. At risk for heart disease Lisa Curry is at a higher than average risk for cardiovascular disease due to obesity.   Assessment/Plan:   1. Type 2 diabetes mellitus without complication, without long-term current use of insulin (Lisa Curry) Good blood sugar control is important to decrease the likelihood of diabetic complications such as nephropathy, neuropathy, limb loss, blindness, coronary artery disease, and death. Intensive lifestyle modification including diet, exercise and weight loss are the  first line of treatment for diabetes. Lisa Curry will get back to diet and exercise, and we will refill Victoza at 1.8 mg daily for 90 days with no refills.  2. Hyperlipidemia associated with type 2 diabetes mellitus (Lisa Curry) Cardiovascular risk and specific lipid/LDL goals reviewed. We discussed several lifestyle modifications today. Lisa Curry was encouraged to continue her statin, and get back to diet, exercise and weight loss efforts; as this will lower her CAD risk significantly. She is to follow up with her primary care physician for further testing. Orders and follow up as documented in patient record.   3. At risk for heart disease Lisa Curry was given approximately 15 minutes of coronary artery disease prevention counseling today. She is 49 y.o. female and has risk factors for heart disease including obesity. We discussed intensive lifestyle modifications today with an emphasis on specific weight loss instructions and strategies.   Repetitive spaced learning was employed today to elicit superior memory formation and behavioral change.  4. Class 3 severe obesity with serious comorbidity and body mass index (BMI) of 45.0 to 49.9 in adult, unspecified obesity type [facility]) Lisa Curry is currently in the action stage of change. As such, her goal is to continue with weight loss efforts. She has agreed to keeping a food journal and adhering to recommended goals of 1100-1300 calories and 75+ grams of protein daily.   Exercise goals: All adults should avoid inactivity. Some physical activity is better than none, and adults who participate in any amount of physical activity gain some health benefits.  Behavioral modification strategies: meal planning and cooking strategies and  keeping a strict food journal.  Lisa Curry has agreed to follow-up with our clinic in 2 to 3 weeks. She was informed of the importance of frequent follow-up visits to maximize her success with intensive lifestyle modifications for her multiple health conditions.    Objective:   Blood pressure 136/68, pulse 85, temperature 98.3 F (36.8 C), temperature source Oral, height 5\' 4"  (1.626 m), weight 274 lb (124.3 kg), last menstrual period 03/06/2020, SpO2 97 %. Body mass index is 47.03 kg/m.  General: Cooperative, alert, well developed, in no acute distress. HEENT: Conjunctivae and lids unremarkable. Cardiovascular: Regular rhythm.  Lungs: Normal work of breathing. Neurologic: No focal deficits.   Lab Results  Component Value Date   CREATININE 0.78 01/27/2020   BUN 10 01/27/2020   NA 138 01/27/2020   K 3.9 01/27/2020   CL 100 01/27/2020   CO2 31 01/27/2020   Lab Results  Component Value Date   ALT 20 01/27/2020   AST 20 01/27/2020   ALKPHOS 88 01/27/2020   BILITOT 0.4 01/27/2020   Lab Results  Component Value Date   HGBA1C 6.2 (H) 12/10/2019   HGBA1C 6.2 (H) 08/01/2019   HGBA1C 5.9 (H) 04/22/2019   HGBA1C 5.9 (H) 11/28/2018   HGBA1C 5.6 08/24/2018   Lab Results  Component Value Date   INSULIN 9.4 12/10/2019   INSULIN 9.8 11/28/2018   INSULIN 20.0 07/26/2018   Lab Results  Component Value Date   TSH 2.330 12/10/2019   Lab Results  Component Value Date   CHOL 121 12/10/2019   HDL 44 12/10/2019   LDLCALC 57 12/10/2019   LDLDIRECT 73.0 04/18/2017   TRIG 108 12/10/2019   CHOLHDL 3 05/16/2018   Lab Results  Component Value Date   WBC 9.8 01/27/2020   HGB 13.4 01/27/2020   HCT 40.8 01/27/2020   MCV 91.1 01/27/2020   PLT 279.0 01/27/2020   Lab Results  Component Value Date   IRON 62 05/30/2016   FERRITIN 55.2 05/30/2016   Attestation Statements:   Reviewed by clinician on day of visit: allergies, medications, problem list, medical history, surgical history, family history, social history, and previous encounter notes.   I, Trixie Dredge, am acting as transcriptionist for Dennard Nip, MD.  I have reviewed the above documentation for accuracy and completeness, and I agree with the above. -  Dennard Nip,  MD

## 2020-03-12 NOTE — Telephone Encounter (Signed)
What dx code should I use for PA?

## 2020-03-13 ENCOUNTER — Encounter: Payer: Self-pay | Admitting: Family Medicine

## 2020-03-13 ENCOUNTER — Telehealth: Payer: Self-pay

## 2020-03-13 NOTE — Telephone Encounter (Signed)
Submitted PA for nystatin powder; key:  [id number], PA case ID:  [id number].  Decision pending.

## 2020-03-16 NOTE — Telephone Encounter (Signed)
Noted.  I used that dx code and submitted PA.  Jeanelle Malling, 6/5/1]

## 2020-03-16 NOTE — Telephone Encounter (Signed)
May use intertrigo L30.4

## 2020-03-19 DIAGNOSIS — F411 Generalized anxiety disorder: Secondary | ICD-10-CM | POA: Diagnosis not present

## 2020-03-19 DIAGNOSIS — F39 Unspecified mood [affective] disorder: Secondary | ICD-10-CM | POA: Diagnosis not present

## 2020-03-19 DIAGNOSIS — F5105 Insomnia due to other mental disorder: Secondary | ICD-10-CM | POA: Diagnosis not present

## 2020-03-20 NOTE — Telephone Encounter (Signed)
Received faxed PA denial.  Reason given:  The health plan's quantity limit is 3- g per 30 days. We did not see information that supports the need of an amount high than the quantity limit.  FYI to Dr. Darnell Level.

## 2020-03-20 NOTE — Telephone Encounter (Signed)
Noted. plz notify patient.

## 2020-03-24 NOTE — Telephone Encounter (Signed)
Spoke with pt informing her of denial.  Says she will contact ins co to see what she can do.

## 2020-03-25 ENCOUNTER — Ambulatory Visit (INDEPENDENT_AMBULATORY_CARE_PROVIDER_SITE_OTHER): Payer: BC Managed Care – PPO | Admitting: Physician Assistant

## 2020-03-25 ENCOUNTER — Encounter (INDEPENDENT_AMBULATORY_CARE_PROVIDER_SITE_OTHER): Payer: Self-pay | Admitting: Physician Assistant

## 2020-03-25 ENCOUNTER — Telehealth: Payer: Self-pay | Admitting: *Deleted

## 2020-03-25 ENCOUNTER — Other Ambulatory Visit: Payer: Self-pay

## 2020-03-25 VITALS — BP 121/74 | HR 80 | Temp 98.7°F | Ht 64.0 in | Wt 276.0 lb

## 2020-03-25 DIAGNOSIS — Z9189 Other specified personal risk factors, not elsewhere classified: Secondary | ICD-10-CM | POA: Diagnosis not present

## 2020-03-25 DIAGNOSIS — E7849 Other hyperlipidemia: Secondary | ICD-10-CM | POA: Diagnosis not present

## 2020-03-25 DIAGNOSIS — E1169 Type 2 diabetes mellitus with other specified complication: Secondary | ICD-10-CM | POA: Diagnosis not present

## 2020-03-25 DIAGNOSIS — Z6841 Body Mass Index (BMI) 40.0 and over, adult: Secondary | ICD-10-CM

## 2020-03-25 DIAGNOSIS — E785 Hyperlipidemia, unspecified: Secondary | ICD-10-CM

## 2020-03-25 MED ORDER — VICTOZA 18 MG/3ML ~~LOC~~ SOPN: 1.8000 mg | PEN_INJECTOR | Freq: Every day | SUBCUTANEOUS | 0 refills | Status: DC

## 2020-03-25 NOTE — Progress Notes (Signed)
Chief Complaint:   Lisa Curry is here to discuss her progress with her obesity treatment plan along with follow-up of her obesity related diagnoses. Lisa Curry is journaling and following the Category 2 meal plan approximately 5% of the time. Lisa Curry states she is exercising 0 minutes 0 times per week.  Today's visit was #: 26 Starting weight: 279 lbs Starting date: 07/26/2018 Today's weight: 276 lbs Today's date: 03/25/2020 Total lbs lost to date: 3 Total lbs lost since last in-office visit: 0  Interim History: Lisa Curry states that she no longer has the motivation to follow a plan or to journal and reports she has a lot of stressors currently at home.  Subjective:   Type 2 diabetes mellitus with hyperlipidemia (Lisa Curry). Fasting blood sugars 110-160. Lisa Curry is on Victoza and Actos. She denies hypoglycemia.   Lab Results  Component Value Date   HGBA1C 6.2 (H) 12/10/2019   HGBA1C 6.2 (H) 08/01/2019   HGBA1C 5.9 (H) 04/22/2019   Lab Results  Component Value Date   MICROALBUR <0.7 05/16/2018   LDLCALC 57 12/10/2019   CREATININE 0.78 01/27/2020   Lab Results  Component Value Date   INSULIN 9.4 12/10/2019   INSULIN 9.8 11/28/2018   INSULIN 20.0 07/26/2018   Other hyperlipidemia. Lisa Curry is on atorvastatin. No chest pain.   Lab Results  Component Value Date   CHOL 121 12/10/2019   HDL 44 12/10/2019   LDLCALC 57 12/10/2019   LDLDIRECT 73.0 04/18/2017   TRIG 108 12/10/2019   CHOLHDL 3 05/16/2018   Lab Results  Component Value Date   ALT 20 01/27/2020   AST 20 01/27/2020   ALKPHOS 88 01/27/2020   BILITOT 0.4 01/27/2020   The ASCVD Risk score Lisa Curry DC Jr., et al., 2013) failed to calculate for the following reasons:   The valid total cholesterol range is 130 to 320 mg/dL  At risk for heart disease. Lisa Curry is at a higher than average risk for cardiovascular disease due to obesity.   Assessment/Plan:   Type 2 diabetes mellitus with hyperlipidemia (Lisa Curry). Good blood sugar  control is important to decrease the likelihood of diabetic complications such as nephropathy, neuropathy, limb loss, blindness, coronary artery disease, and death. Intensive lifestyle modification including diet, exercise and weight loss are the first line of treatment for diabetes. Lisa Curry will continue her medications as directed. Refill was given for liraglutide (VICTOZA) 18 MG/3ML SOPN #3 pens with 0 refills.  Other hyperlipidemia. Cardiovascular risk and specific lipid/LDL goals reviewed.  We discussed several lifestyle modifications today and Lisa Curry will continue to work on diet, exercise and weight loss efforts. Orders and follow up as documented in patient record. Lisa Curry will continue her medication as directed.   Counseling Intensive lifestyle modifications are the first line treatment for this issue. . Dietary changes: Increase soluble fiber. Decrease simple carbohydrates. . Exercise changes: Moderate to vigorous-intensity aerobic activity 150 minutes per week if tolerated. . Lipid-lowering medications: see documented in medical record.  At risk for heart disease. Lisa Curry was given approximately 15 minutes of coronary artery disease prevention counseling today. She is 49 y.o. female and has risk factors for heart disease including obesity. We discussed intensive lifestyle modifications today with an emphasis on specific weight loss instructions and strategies.   Repetitive spaced learning was employed today to elicit superior memory formation and behavioral change.  Class 3 severe obesity with serious comorbidity and body mass index (BMI) of 45.0 to 49.9 in adult, unspecified obesity type (Lisa Curry).  Lisa Curry is currently in the action stage of change. As such, her goal is to continue with weight loss efforts. She has agreed to practicing portion control and making smarter food choices, such as increasing vegetables and decreasing simple carbohydrates.   Exercise goals: For substantial health benefits,  adults should do at least 150 minutes (2 hours and 30 minutes) a week of moderate-intensity, or 75 minutes (1 hour and 15 minutes) a week of vigorous-intensity aerobic physical activity, or an equivalent combination of moderate- and vigorous-intensity aerobic activity. Aerobic activity should be performed in episodes of at least 10 minutes, and preferably, it should be spread throughout the week.  Behavioral modification strategies: meal planning and cooking strategies and keeping healthy foods in the home.  Lisa Curry has agreed to follow-up with our clinic in 6 weeks. She was informed of the importance of frequent follow-up visits to maximize her success with intensive lifestyle modifications for her multiple health conditions.   Objective:   Blood pressure 121/74, pulse 80, temperature 98.7 F (37.1 C), temperature source Oral, height 5\' 4"  (1.626 m), weight 276 lb (125.2 kg), last menstrual period 03/06/2020, SpO2 97 %. Body mass index is 47.38 kg/m.  General: Cooperative, alert, well developed, in no acute distress. HEENT: Conjunctivae and lids unremarkable. Cardiovascular: Regular rhythm.  Lungs: Normal work of breathing. Neurologic: No focal deficits.   Lab Results  Component Value Date   CREATININE 0.78 01/27/2020   BUN 10 01/27/2020   NA 138 01/27/2020   K 3.9 01/27/2020   CL 100 01/27/2020   CO2 31 01/27/2020   Lab Results  Component Value Date   ALT 20 01/27/2020   AST 20 01/27/2020   ALKPHOS 88 01/27/2020   BILITOT 0.4 01/27/2020   Lab Results  Component Value Date   HGBA1C 6.2 (H) 12/10/2019   HGBA1C 6.2 (H) 08/01/2019   HGBA1C 5.9 (H) 04/22/2019   HGBA1C 5.9 (H) 11/28/2018   HGBA1C 5.6 08/24/2018   Lab Results  Component Value Date   INSULIN 9.4 12/10/2019   INSULIN 9.8 11/28/2018   INSULIN 20.0 07/26/2018   Lab Results  Component Value Date   TSH 2.330 12/10/2019   Lab Results  Component Value Date   CHOL 121 12/10/2019   HDL 44 12/10/2019   LDLCALC  57 12/10/2019   LDLDIRECT 73.0 04/18/2017   TRIG 108 12/10/2019   CHOLHDL 3 05/16/2018   Lab Results  Component Value Date   WBC 9.8 01/27/2020   HGB 13.4 01/27/2020   HCT 40.8 01/27/2020   MCV 91.1 01/27/2020   PLT 279.0 01/27/2020   Lab Results  Component Value Date   IRON 62 05/30/2016   FERRITIN 55.2 05/30/2016   Attestation Statements:   Reviewed by clinician on day of visit: allergies, medications, problem list, medical history, surgical history, family history, social history, and previous encounter notes.  IMichaelene Song, am acting as transcriptionist for Abby Potash, PA-C   I have reviewed the above documentation for accuracy and completeness, and I agree with the above. Abby Potash, PA-C

## 2020-03-25 NOTE — Telephone Encounter (Signed)
Patient left a voicemail stating that she has been using Nystatn cream for yeast infections. Patient stated that she is miserable and wants to know if you can prescribe something different. Patient wants to know if there is an oral pill that she can be prescribed?  Called patient and was advised that the yeast is in the folds of her skin and under her breast. Patient stated that Dr. Danise [name] has seen her for this. Patient stated that she needs something stronger. Pharmacy [facility]

## 2020-03-26 MED ORDER — FLUCONAZOLE 150 MG PO TABS: 150.0000 mg | ORAL_TABLET | ORAL | 0 refills | Status: DC

## 2020-03-26 NOTE — Telephone Encounter (Signed)
Spoke with pt relaying Dr. G's message.  Pt verbalizes understanding and expresses her thanks.  

## 2020-03-26 NOTE — Telephone Encounter (Signed)
Plz notify diflucan sent to pharmacy. Take once weekly for 3 wks.  If not improved with this will need office visit for evaluation.

## 2020-04-05 ENCOUNTER — Encounter: Payer: Self-pay | Admitting: Emergency Medicine

## 2020-04-05 ENCOUNTER — Ambulatory Visit
Admission: EM | Admit: 2020-04-05 | Discharge: 2020-04-05 | Disposition: A | Discharge status: Home / Self Care | Payer: BC Managed Care – PPO | Attending: Emergency Medicine | Admitting: Emergency Medicine

## 2020-04-05 ENCOUNTER — Other Ambulatory Visit: Payer: Self-pay

## 2020-04-05 DIAGNOSIS — K122 Cellulitis and abscess of mouth: Secondary | ICD-10-CM | POA: Diagnosis not present

## 2020-04-05 LAB — POCT RAPID STREP A (OFFICE): Rapid Strep A Screen: NEGATIVE

## 2020-04-05 MED ORDER — METHYLPREDNISOLONE SODIUM SUCC 125 MG IJ SOLR
125.0000 mg | Freq: Once | INTRAMUSCULAR | Status: AC
  Administered 2020-04-05: 125 mg via INTRAMUSCULAR

## 2020-04-05 NOTE — Discharge Instructions (Addendum)

## 2020-04-05 NOTE — ED Notes (Signed)
Patient able to ambulate independently  

## 2020-04-05 NOTE — ED Triage Notes (Signed)
Pt sts "I woke up around 5am with a sore throat and my epiglottis swollen." Pt sts that she tends to get strep with a negative rapid test frequently. Denies fever

## 2020-04-05 NOTE — ED Provider Notes (Signed)
[name]    CSN: [id number] Arrival date & time: 04/05/20  0813      History   Chief Complaint Chief Complaint  Patient presents with  . Sore Throat    HPI Lisa Curry is a 49 y.o. female with history of diabetes, allergies, GERD presenting for sore throat.  Patient writes history: States she woke up around 5:00 this morning with a sore throat and feels that her epiglottis is swollen.  Denies choking, difficulty breathing or swallowing.  States that she has a history of strep, though test negative on [place] frequently.  No fever, drooling, dental pain, ear pain, arthralgias, myalgias, chest pain, palpitations, difficulty breathing.  Endorsing mild cough.  States she clean the tub last night, feels this could be contributory.   Past Medical History:  Diagnosis Date  . Allergy   . Anemia   . Anxiety   . Barrett esophagus    [place], have not received EGD report  . Chronic back pain   . Constipation   . Depression with anxiety   . Diabetes mellitus without complication ([place])    DSME 04/2014  . Edema, lower extremity   . Fatty liver   . Gastric polyp 05/2012   h/o EGD  . GERD (gastroesophageal reflux disease)   . History of Helicobacter pylori infection   . HLD (hyperlipidemia)   . Hyperlipidemia   . Vitamin D deficiency     Patient Active Problem List   Diagnosis Date Noted  . Chronic constipation 09/21/2019  . Onychomycosis 09/21/2019  . Leukocytosis 08/08/2019  . Other depression, emotional eating 11/07/2018  . OSA (obstructive sleep apnea) 10/23/2018  . Type 2 diabetes mellitus without complication, without long-term current use of insulin ([place]) 07/26/2018  . Fatty liver 07/26/2018  . Anemia 09/01/2016  . Umbilical hernia 13/24/4010  . Lower back pain 08/16/2016  . Intertrigo 06/04/2016  . Vitamin D deficiency   . HLD (hyperlipidemia)   . Encounter for routine adult medical exam with abnormal findings 01/20/2015  . Morbid obesity with BMI of  45.0-49.9, adult ([place]) 11/04/2013  . GERD (gastroesophageal reflux disease) 11/04/2013  . MDD (major depressive disorder), recurrent episode, moderate ([place]) 11/04/2013    Past Surgical History:  Procedure Laterality Date  . CESAREAN SECTION  6/95, 4/98  . DILATION AND CURETTAGE OF UTERUS  2011  . ESOPHAGOGASTRODUODENOSCOPY  2012?   [facility]  . ESOPHAGOGASTRODUODENOSCOPY  10/2017   [facility] Hughs)  . LUMBAR DISC SURGERY  03/1995  . NASAL SINUS SURGERY  03/2001    OB History    Gravida  2   Para  2   Term      Preterm      AB      Living        SAB      TAB      Ectopic      Multiple      Live Births               Home Medications    Prior to Admission medications   Medication Sig Start Date End Date Taking? Authorizing Provider  atorvastatin (LIPITOR) 20 MG tablet Take 1 tablet (20 mg total) by mouth daily at 6 PM. 09/18/19  Yes Ria Bush, MD  Cholecalciferol (VITAMIN D3) 125 MCG (5000 UT) TABS Take 1 tablet by mouth daily.   Yes [provider]  cyclobenzaprine (FLEXERIL) 10 MG tablet Take 1 tablet (10 mg total)  by mouth 2 (two) times daily as needed for muscle spasms (sedation precautions). 07/01/19  Yes Ria Bush, MD  dexlansoprazole [facility]) 60 MG capsule TAKE 1 CAPSULE BY MOUTH EVERY DAY 09/18/19  Yes Ria Bush, MD  fluconazole (DIFLUCAN) 150 MG tablet Take 1 tablet (150 mg total) by mouth once a week. 03/26/20  Yes Ria Bush, MD  gabapentin (NEURONTIN) 300 MG capsule Take 1 capsule (300 mg total) by mouth 3 (three) times daily. 09/18/19  Yes Ria Bush, MD  lamoTRIgine (LAMICTAL) 200 MG tablet Take 1 tablet (200 mg total) by mouth daily. 01/03/20  Yes Ria Bush, MD  linaclotide [facility]) 145 MCG CAPS capsule Take 1 capsule (145 mcg total) by mouth daily. 10/14/19  Yes Beasley, Caren D, MD  liraglutide (VICTOZA) 18 MG/3ML SOPN Inject 0.3 mLs (1.8 mg total) into the skin daily.  03/25/20 06/23/20 Yes Abby Potash, PA-C  Multiple Vitamin (MULTIVITAMIN) capsule Take 1 capsule by mouth daily.   Yes [provider]  nystatin cream (MYCOSTATIN) Apply 1 application topically 2 (two) times daily. 02/27/20  Yes Ria Bush, MD  pioglitazone (ACTOS) 30 MG tablet Take 1 tablet (30 mg total) by mouth daily. 09/18/19  Yes Ria Bush, MD  sertraline (ZOLOFT) 50 MG tablet Take 50 mg by mouth daily.   Yes [provider]  triamcinolone (NASACORT ALLERGY 24HR) 55 MCG/ACT AERO nasal inhaler Place 2 sprays into the nose daily.   Yes [provider]  acetaminophen-codeine (TYLENOL #3) 300-30 MG tablet Take 1 tablet by mouth every 8 (eight) hours as needed for moderate pain. 07/02/19   Ria Bush, MD  clonazePAM (KLONOPIN) 0.5 MG tablet TAKE 1 TABLET BY MOUTH TWICE A DAY AS NEEDED FOR ANXIETY Patient taking differently: Provider suggested stopping this.  Patient did stop and now taking this again every day as of this week 10/18/19   Ria Bush, MD  docusate sodium (COLACE) 100 MG capsule Take 1 capsule (100 mg total) by mouth 2 (two) times daily. Patient taking differently: Take 100 mg by mouth 2 (two) times daily. As needed 07/03/19   Jearld Lesch A, DO  glucose blood (ONE TOUCH ULTRA TEST) test strip Use to check sugar twice daily and as needed. Dx: E11.65 12/15/17   Ria Bush, MD  ibuprofen (ADVIL,MOTRIN) 600 MG tablet Take 600 mg by mouth daily.    [provider]  Insulin Pen Needle (PEN NEEDLES) 32G X 6 MM MISC 1 each by Does not apply route daily. 12/11/19   Dennard Nip D, MD  lamoTRIgine (LAMICTAL) 25 MG tablet Take 50 mg by mouth at bedtime. 02/25/20   [provider]  Lancet Devices (ONE TOUCH DELICA LANCING DEV) MISC Use as directed with lancets and one touch meter E11.65 12/15/17   Ria Bush, MD  meclizine (ANTIVERT) 12.5 MG tablet Take 12.5 mg by mouth 3 (three) times daily as needed for dizziness.     [provider]  [facility] DELICA LANCETS 54Y MISC Check blood sugar twice daily and as directed. Dx E11.65 12/01/17   Ria Bush, MD  polyethylene glycol powder (GLYCOLAX/MIRALAX) 17 GM/SCOOP powder Take 17 g by mouth daily. 03/27/19   Abby Potash, PA-C  buPROPion (WELLBUTRIN XL) 300 MG 24 hr tablet Take 1 tablet (300 mg total) by mouth every morning. 11/13/19 04/05/20  Arfeen, Arlyce Harman, MD  venlafaxine XR (EFFEXOR XR) 37.5 MG 24 hr capsule Take two capsule for one week and than one capsule daily. Patient taking differently: Take 37.5 mg by mouth daily  with breakfast. CURRENTLY TITRATING 11/27/19 04/05/20  Arfeen, Arlyce Harman, MD    Family History Family History  Problem Relation Age of Onset  . Breast cancer Maternal Grandmother 2  . Dementia Maternal Grandmother   . Heart disease Paternal Grandfather   . Other Sister        Postural Orthostatic Tachycardia Syndrome  . Supraventricular tachycardia Sister   . Other Maternal Aunt        hysterectomy for irregular bleed with abnormal pap  . Hyperlipidemia Mother   . CAD Father        5 stents placed  . Alzheimer's disease Maternal Grandfather     Social History Social History   Tobacco Use  . Smoking status: Never Smoker  . Smokeless tobacco: Never Used  Vaping Use  . Vaping Use: Never used  Substance Use Topics  . Alcohol use: Yes    Alcohol/week: 0.0 standard drinks    Comment: occasional  . Drug use: No     Allergies   Povidone iodine, Biaxin [clarithromycin], and Metformin and related   Review of Systems As per HPI   Physical Exam Triage Vital Signs ED Triage Vitals [04/05/20 0821]  Enc Vitals Group     BP      Pulse      Resp      Temp      Temp src      SpO2      Weight      Height      Head Circumference      Peak Flow      Pain Score 3     Pain Loc      Pain Edu?      Excl. in [place]?    No data found.  Updated Vital Signs BP 126/81 (BP Location: Right Arm)   Pulse 76   Temp 98.3 F  (36.8 C) (Oral)   Resp 16   LMP 03/06/2020 (Exact Date)   SpO2 95%   Visual Acuity Right Eye Distance:   Left Eye Distance:   Bilateral Distance:    Right Eye Near:   Left Eye Near:    Bilateral Near:     Physical Exam Constitutional:      General: She is not in acute distress. HENT:     Head: Normocephalic and atraumatic.     Jaw: There is normal jaw occlusion. No tenderness or pain on movement.     Right Ear: Hearing, tympanic membrane, ear canal and external ear normal. No tenderness. No mastoid tenderness.     Left Ear: Hearing, tympanic membrane, ear canal and external ear normal. No tenderness. No mastoid tenderness.     Nose: No nasal deformity, septal deviation or nasal tenderness.     Right Turbinates: Not swollen or pale.     Left Turbinates: Not swollen or pale.     Right Sinus: No maxillary sinus tenderness or frontal sinus tenderness.     Left Sinus: No maxillary sinus tenderness or frontal sinus tenderness.     Mouth/Throat:     Lips: Pink. No lesions.     Mouth: Mucous membranes are moist. No injury.     Pharynx: Oropharynx is clear. Uvula midline. No posterior oropharyngeal erythema or uvula swelling.     Tonsils: 1+ on the right. 1+ on the left.     Comments: Uvula mildly elongated, the midline emergency symmetrically.  No tonsillar exudate or hypertrophy Cardiovascular:     Rate and Rhythm: Normal rate.  Pulmonary:     Effort: Pulmonary effort is normal.  Musculoskeletal:     Cervical back: Normal range of motion and neck supple. No muscular tenderness.  Lymphadenopathy:     Cervical: No cervical adenopathy.  Neurological:     Mental Status: She is alert and oriented to person, place, and time.      UC Treatments / Results  Labs (all labs ordered are listed, but only abnormal results are displayed) Labs Reviewed  POCT RAPID STREP A (OFFICE) - Normal  CULTURE, GROUP A STREP [facility])    EKG   Radiology No results  found.  Procedures Procedures (including critical care time)  Medications Ordered in UC Medications  methylPREDNISolone sodium succinate (SOLU-MEDROL) 125 mg/2 mL injection 125 mg (125 mg Intramuscular Given 04/05/20 0849)    Initial Impression / Assessment and Plan / UC Course  I have reviewed the triage vital signs and the nursing notes.  Pertinent labs & imaging results that were available during my care of the patient were reviewed by me and considered in my medical decision making (see chart for details).     Patient afebrile, nontoxic in office today.  No airway compromise at this time.  Given Solu-Medrol in office which he tolerated well.  Strep negative, culture pending.  Return precautions discussed, pt verbalized understanding and is agreeable to plan. Final Clinical Impressions(s) / UC Diagnoses   Final diagnoses:  Uvulitis     Discharge Instructions     Your rapid strep test was negative today.  The culture is pending.  Please look on your MyChart for test results.   We will notify you if the culture positive and outline a treatment plan at that time.   Please continue Tylenol and/or Ibuprofen as needed for fever, pain.  May try warm salt water gargles, cepacol lozenges, throat spray, warm tea or water with lemon/honey, or OTC cold relief medicine for throat discomfort.   For congestion: take a daily anti-histamine like Zyrtec, Claritin, and a oral decongestant to help with post nasal drip that may be irritating your throat.   It is important to stay hydrated: drink plenty of fluids (primarily water) to keep your throat moisturized and help further relieve irritation/discomfort.     ED Prescriptions    None     PDMP not reviewed this encounter.   Hall[place]Potvin, [place], [place] 04/05/20 [phone]

## 2020-04-08 LAB — CULTURE, GROUP A STREP (THRC)

## 2020-04-20 DIAGNOSIS — F5105 Insomnia due to other mental disorder: Secondary | ICD-10-CM | POA: Diagnosis not present

## 2020-04-20 DIAGNOSIS — F411 Generalized anxiety disorder: Secondary | ICD-10-CM | POA: Diagnosis not present

## 2020-04-20 DIAGNOSIS — F39 Unspecified mood [affective] disorder: Secondary | ICD-10-CM | POA: Diagnosis not present

## 2020-04-25 DIAGNOSIS — G4733 Obstructive sleep apnea (adult) (pediatric): Secondary | ICD-10-CM | POA: Diagnosis not present

## 2020-05-05 ENCOUNTER — Encounter (INDEPENDENT_AMBULATORY_CARE_PROVIDER_SITE_OTHER): Payer: Self-pay | Admitting: Physician Assistant

## 2020-05-05 ENCOUNTER — Ambulatory Visit (INDEPENDENT_AMBULATORY_CARE_PROVIDER_SITE_OTHER): Payer: BC Managed Care – PPO | Admitting: Physician Assistant

## 2020-05-05 ENCOUNTER — Other Ambulatory Visit: Payer: Self-pay

## 2020-05-05 VITALS — BP 120/74 | HR 69 | Temp 97.9°F | Ht 64.0 in | Wt 269.0 lb

## 2020-05-05 DIAGNOSIS — E7849 Other hyperlipidemia: Secondary | ICD-10-CM | POA: Diagnosis not present

## 2020-05-05 DIAGNOSIS — Z9189 Other specified personal risk factors, not elsewhere classified: Secondary | ICD-10-CM

## 2020-05-05 DIAGNOSIS — Z6841 Body Mass Index (BMI) 40.0 and over, adult: Secondary | ICD-10-CM

## 2020-05-05 DIAGNOSIS — E1169 Type 2 diabetes mellitus with other specified complication: Secondary | ICD-10-CM

## 2020-05-05 DIAGNOSIS — E785 Hyperlipidemia, unspecified: Secondary | ICD-10-CM

## 2020-05-05 MED ORDER — VICTOZA 18 MG/3ML ~~LOC~~ SOPN: 1.8000 mg | PEN_INJECTOR | Freq: Every day | SUBCUTANEOUS | 0 refills | Status: DC

## 2020-05-06 NOTE — Progress Notes (Signed)
Chief Complaint:   Lisa Curry is here to discuss her progress with her obesity treatment plan along with follow-up of her obesity related diagnoses. Lisa Curry is practicing portion control and making smarter food choices, such as increasing vegetables and decreasing simple carbohydrates and states she is following her eating plan approximately 100% of the time. Lisa Curry states she is exercising 0 minutes 0 times per week.  Today's visit was #: 28 Starting weight: 279 lbs Starting date: 07/26/2018 Today's weight: 269 lbs Today's date: 05/05/2020 Total lbs lost to date: 10 Total lbs lost since last in-office visit: 7  Interim History: Lisa Curry reports that changing to the PC/[place] plan took the pressure off of eating. She has been making good choices most of the time with her food.  Subjective:   Type 2 diabetes mellitus with hyperlipidemia ([place]). Lisa Curry is on Victoza and Actos. No hypoglycemia. Fasting blood sugars are in the range of 101 and 150.  Lab Results  Component Value Date   HGBA1C 6.2 (H) 12/10/2019   HGBA1C 6.2 (H) 08/01/2019   HGBA1C 5.9 (H) 04/22/2019   Lab Results  Component Value Date   MICROALBUR <0.7 05/16/2018   LDLCALC 57 12/10/2019   CREATININE 0.78 01/27/2020   Lab Results  Component Value Date   INSULIN 9.4 12/10/2019   INSULIN 9.8 11/28/2018   INSULIN 20.0 07/26/2018   Other hyperlipidemia. Lisa Curry is on Lipitor, which she is tolerating well.  Lab Results  Component Value Date   CHOL 121 12/10/2019   HDL 44 12/10/2019   LDLCALC 57 12/10/2019   LDLDIRECT 73.0 04/18/2017   TRIG 108 12/10/2019   CHOLHDL 3 05/16/2018   Lab Results  Component Value Date   ALT 20 01/27/2020   AST 20 01/27/2020   ALKPHOS 88 01/27/2020   BILITOT 0.4 01/27/2020   The ASCVD Risk score Lisa Curry Bussing DC Jr., et al., 2013) failed to calculate for the following reasons:   The valid total cholesterol range is 130 to 320 mg/dL  At risk for hypoglycemia. Lisa Curry is at increased risk  for hypoglycemia due to changes in diet, diagnosis of diabetes, and/or insulin use.   Assessment/Plan:   Type 2 diabetes mellitus with hyperlipidemia ([place]). Good blood sugar control is important to decrease the likelihood of diabetic complications such as nephropathy, neuropathy, limb loss, blindness, coronary artery disease, and death. Intensive lifestyle modification including diet, exercise and weight loss are the first line of treatment for diabetes. Refill was given for liraglutide (VICTOZA) 18 MG/3ML SOPN #3 pens with 0 refills.  Other hyperlipidemia.  Cardiovascular risk and specific lipid/LDL goals reviewed.  We discussed several lifestyle modifications today and Lisa Curry will continue to work on diet, exercise and weight loss efforts. Orders and follow up as documented in patient record. She will continue her medication as directed.   Counseling Intensive lifestyle modifications are the first line treatment for this issue. . Dietary changes: Increase soluble fiber. Decrease simple carbohydrates. . Exercise changes: Moderate to vigorous-intensity aerobic activity 150 minutes per week if tolerated. . Lipid-lowering medications: see documented in medical record.  At risk for hypoglycemia. Lisa Curry was given approximately 15 minutes of counseling today regarding prevention of hypoglycemia. She was advised of symptoms of hypoglycemia. Lisa Curry was instructed to avoid skipping meals, eat regular protein rich meals and schedule low calorie snacks as needed.   Repetitive spaced learning was employed today to elicit superior memory formation and behavioral change.  Class 3 severe obesity with serious comorbidity and body  mass index (BMI) of 45.0 to 49.9 in adult, unspecified obesity type ([place]).  Lisa Curry is currently in the action stage of change. As such, her goal is to continue with weight loss efforts. She has agreed to practicing portion control and making smarter food choices, such as increasing vegetables  and decreasing simple carbohydrates.   Exercise goals: For substantial health benefits, adults should do at least 150 minutes (2 hours and 30 minutes) a week of moderate-intensity, or 75 minutes (1 hour and 15 minutes) a week of vigorous-intensity aerobic physical activity, or an equivalent combination of moderate- and vigorous-intensity aerobic activity. Aerobic activity should be performed in episodes of at least 10 minutes, and preferably, it should be spread throughout the week.  Behavioral modification strategies: meal planning and cooking strategies and keeping healthy foods in the home.  Lisa Curry has agreed to follow-up with our clinic in 6 weeks. She was informed of the importance of frequent follow-up visits to maximize her success with intensive lifestyle modifications for her multiple health conditions.   Objective:   Blood pressure 120/74, pulse 69, temperature 97.9 F (36.6 C), temperature source Oral, height 5\' 4"  (1.626 m), weight 269 lb (122 kg), SpO2 98 %. Body mass index is 46.17 kg/m.  General: Cooperative, alert, well developed, in no acute distress. HEENT: Conjunctivae and lids unremarkable. Cardiovascular: Regular rhythm.  Lungs: Normal work of breathing. Neurologic: No focal deficits.   Lab Results  Component Value Date   CREATININE 0.78 01/27/2020   BUN 10 01/27/2020   NA 138 01/27/2020   K 3.9 01/27/2020   CL 100 01/27/2020   CO2 31 01/27/2020   Lab Results  Component Value Date   ALT 20 01/27/2020   AST 20 01/27/2020   ALKPHOS 88 01/27/2020   BILITOT 0.4 01/27/2020   Lab Results  Component Value Date   HGBA1C 6.2 (H) 12/10/2019   HGBA1C 6.2 (H) 08/01/2019   HGBA1C 5.9 (H) 04/22/2019   HGBA1C 5.9 (H) 11/28/2018   HGBA1C 5.6 08/24/2018   Lab Results  Component Value Date   INSULIN 9.4 12/10/2019   INSULIN 9.8 11/28/2018   INSULIN 20.0 07/26/2018   Lab Results  Component Value Date   TSH 2.330 12/10/2019   Lab Results  Component Value Date    CHOL 121 12/10/2019   HDL 44 12/10/2019   LDLCALC 57 12/10/2019   LDLDIRECT 73.0 04/18/2017   TRIG 108 12/10/2019   CHOLHDL 3 05/16/2018   Lab Results  Component Value Date   WBC 9.8 01/27/2020   HGB 13.4 01/27/2020   HCT 40.8 01/27/2020   MCV 91.1 01/27/2020   PLT 279.0 01/27/2020   Lab Results  Component Value Date   IRON 62 05/30/2016   FERRITIN 55.2 05/30/2016   Attestation Statements:   Reviewed by clinician on day of visit: allergies, medications, problem list, medical history, surgical history, family history, social history, and previous encounter notes.  IMichaelene Song, am acting as transcriptionist for Abby Potash, PA-C   I have reviewed the above documentation for accuracy and completeness, and I agree with the above. Abby Potash, PA-C

## 2020-05-26 DIAGNOSIS — G4733 Obstructive sleep apnea (adult) (pediatric): Secondary | ICD-10-CM | POA: Diagnosis not present

## 2020-06-14 ENCOUNTER — Other Ambulatory Visit (INDEPENDENT_AMBULATORY_CARE_PROVIDER_SITE_OTHER): Payer: Self-pay | Admitting: Family Medicine

## 2020-06-14 DIAGNOSIS — E119 Type 2 diabetes mellitus without complications: Secondary | ICD-10-CM

## 2020-06-15 ENCOUNTER — Ambulatory Visit: Payer: BC Managed Care – PPO | Admitting: Adult Health

## 2020-06-16 ENCOUNTER — Encounter (INDEPENDENT_AMBULATORY_CARE_PROVIDER_SITE_OTHER): Payer: Self-pay | Admitting: Physician Assistant

## 2020-06-16 ENCOUNTER — Other Ambulatory Visit: Payer: Self-pay

## 2020-06-16 ENCOUNTER — Ambulatory Visit (INDEPENDENT_AMBULATORY_CARE_PROVIDER_SITE_OTHER): Payer: BC Managed Care – PPO | Admitting: Physician Assistant

## 2020-06-16 VITALS — BP 131/78 | HR 63 | Temp 97.4°F | Ht 64.0 in | Wt 270.0 lb

## 2020-06-16 DIAGNOSIS — E559 Vitamin D deficiency, unspecified: Secondary | ICD-10-CM

## 2020-06-16 DIAGNOSIS — E7849 Other hyperlipidemia: Secondary | ICD-10-CM

## 2020-06-16 DIAGNOSIS — E785 Hyperlipidemia, unspecified: Secondary | ICD-10-CM | POA: Diagnosis not present

## 2020-06-16 DIAGNOSIS — E1169 Type 2 diabetes mellitus with other specified complication: Secondary | ICD-10-CM

## 2020-06-16 DIAGNOSIS — Z9189 Other specified personal risk factors, not elsewhere classified: Secondary | ICD-10-CM | POA: Diagnosis not present

## 2020-06-16 DIAGNOSIS — Z6841 Body Mass Index (BMI) 40.0 and over, adult: Secondary | ICD-10-CM

## 2020-06-16 MED ORDER — PEN NEEDLES 32G X 6 MM MISC: 1.0000 | Freq: Every day | 0 refills | Status: DC

## 2020-06-16 MED ORDER — VICTOZA 18 MG/3ML ~~LOC~~ SOPN: 1.8000 mg | PEN_INJECTOR | Freq: Every day | SUBCUTANEOUS | 0 refills | Status: DC

## 2020-06-16 NOTE — Progress Notes (Signed)
Chief Complaint:   OBESITY Lisa Curry is here to discuss her progress with her obesity treatment plan along with follow-up of her obesity related diagnoses. Lisa Curry is on practicing portion control and making smarter food choices, such as increasing vegetables and decreasing simple carbohydrates and states she is following her eating plan approximately 50% of the time. Lisa Curry states she is walking for 20 minutes 2 times per week.  Today's visit was #: 36 Starting weight: 279 lbs Starting date: 07/26/2018 Today's weight: 270 lbs Today's date: 06/16/2020 Total lbs lost to date: 9 Total lbs lost since last in-office visit: 0  Interim History: Lisa Curry states that the portion control and smarter food choices plan helps her because it stresses her less than a Category plan. She notes increased cravings for carbohydrates around her menstrual cycle.  Subjective:   1. Type 2 diabetes mellitus with hyperlipidemia (HCC) Lisa Curry's fasting BGs averages in 130's. She is on Victoza and Actos, and is tolerating them well. She is due for labs. She declines MAB and insulin today due to cost.  2. Other hyperlipidemia Lisa Curry is on Lipitor and she is tolerating it well. She is walking 2 times per week. She is due for labs.  3. Vitamin D deficiency Lisa Curry is on OTC Vit D 5,000 IU daily. She is walking outside 2 times per week.  5. At risk for heart disease Lisa Curry is at a higher than average risk for cardiovascular disease due to obesity.   Assessment/Plan:   1. Type 2 diabetes mellitus with hyperlipidemia (HCC) Good blood sugar control is important to decrease the likelihood of diabetic complications such as nephropathy, neuropathy, limb loss, blindness, coronary artery disease, and death. Intensive lifestyle modification including diet, exercise and weight loss are the first line of treatment for diabetes. We will check labs today. We will refill Victoza for 90 days, and needles #100 with no refill.  - Hemoglobin A1c -  Comprehensive metabolic panel - liraglutide (VICTOZA) 18 MG/3ML SOPN; Inject 1.8 mg into the skin daily.  Dispense: 9 mL; Refill: 0 - Insulin Pen Needle (PEN NEEDLES) 32G X 6 MM MISC; 1 each by Does not apply route daily.  Dispense: 100 each; Refill: 0  2. Other hyperlipidemia Cardiovascular risk and specific lipid/LDL goals reviewed. We discussed several lifestyle modifications today. Lisa Curry will continue to work on diet, exercise and weight loss efforts. We will check labs today. Orders and follow up as documented in patient record.   Counseling Intensive lifestyle modifications are the first line treatment for this issue. . Dietary changes: Increase soluble fiber. Decrease simple carbohydrates. . Exercise changes: Moderate to vigorous-intensity aerobic activity 150 minutes per week if tolerated. . Lipid-lowering medications: see documented in medical record.  - Lipid panel  3. Vitamin D deficiency Low Vitamin D level contributes to fatigue and are associated with obesity, breast, and colon cancer. We will check labs today. Lisa Curry agreed to continue taking Vitamin D and will follow-up for routine testing of Vitamin D, at least 2-3 times per year to avoid over-replacement.  - VITAMIN D 25 Hydroxy (Vit-D Deficiency, Fractures)  5. At risk for heart disease Lisa Curry was given approximately 15 minutes of coronary artery disease prevention counseling today. She is 49 y.o. female and has risk factors for heart disease including obesity. We discussed intensive lifestyle modifications today with an emphasis on specific weight loss instructions and strategies.   Repetitive spaced learning was employed today to elicit superior memory formation and behavioral change.  6. Class  3 severe obesity with serious comorbidity and body mass index (BMI) of 45.0 to 49.9 in adult, unspecified obesity type Lisa Curry) Lisa Curry is currently in the action stage of change. As such, her goal is to continue with weight loss efforts.  She has agreed to practicing portion control and making smarter food choices, such as increasing vegetables and decreasing simple carbohydrates.   Exercise goals: As is.  Behavioral modification strategies: meal planning and cooking strategies and keeping healthy foods in the home.  Lisa Curry has agreed to follow-up with our clinic in 6 weeks. She was informed of the importance of frequent follow-up visits to maximize her success with intensive lifestyle modifications for her multiple health conditions.   Lisa Curry was informed we would discuss her lab results at her next visit unless there is a critical issue that needs to be addressed sooner. Lisa Curry agreed to keep her next visit at the agreed upon time to discuss these results.  Objective:   Blood pressure 131/78, pulse 63, temperature (!) 97.4 F (36.3 C), height 5\' 4"  (1.626 m), weight 270 lb (122.5 kg), SpO2 100 %. Body mass index is 46.35 kg/m.  General: Cooperative, alert, well developed, in no acute distress. HEENT: Conjunctivae and lids unremarkable. Cardiovascular: Regular rhythm.  Lungs: Normal work of breathing. Neurologic: No focal deficits.   Lab Results  Component Value Date   CREATININE 0.78 01/27/2020   BUN 10 01/27/2020   NA 138 01/27/2020   K 3.9 01/27/2020   CL 100 01/27/2020   CO2 31 01/27/2020   Lab Results  Component Value Date   ALT 20 01/27/2020   AST 20 01/27/2020   ALKPHOS 88 01/27/2020   BILITOT 0.4 01/27/2020   Lab Results  Component Value Date   HGBA1C 6.2 (H) 12/10/2019   HGBA1C 6.2 (H) 08/01/2019   HGBA1C 5.9 (H) 04/22/2019   HGBA1C 5.9 (H) 11/28/2018   HGBA1C 5.6 08/24/2018   Lab Results  Component Value Date   INSULIN 9.4 12/10/2019   INSULIN 9.8 11/28/2018   INSULIN 20.0 07/26/2018   Lab Results  Component Value Date   TSH 2.330 12/10/2019   Lab Results  Component Value Date   CHOL 121 12/10/2019   HDL 44 12/10/2019   LDLCALC 57 12/10/2019   LDLDIRECT 73.0 04/18/2017   TRIG 108  12/10/2019   CHOLHDL 3 05/16/2018   Lab Results  Component Value Date   WBC 9.8 01/27/2020   HGB 13.4 01/27/2020   HCT 40.8 01/27/2020   MCV 91.1 01/27/2020   PLT 279.0 01/27/2020   Lab Results  Component Value Date   IRON 62 05/30/2016   FERRITIN 55.2 05/30/2016   Attestation Statements:   Reviewed by clinician on day of visit: allergies, medications, problem list, medical history, surgical history, family history, social history, and previous encounter notes.   Wilhemena Durie, am acting as transcriptionist for [employer], PA-C.  I have reviewed the above documentation for accuracy and completeness, and I agree with the above. Abby Potash, PA-C

## 2020-06-17 LAB — COMPREHENSIVE METABOLIC PANEL
ALT: 17 IU/L (ref 0–32)
AST: 18 IU/L (ref 0–40)
Albumin/Globulin Ratio: 1.9 (ref 1.2–2.2)
Albumin: 4.1 g/dL (ref 3.8–4.8)
Alkaline Phosphatase: 109 IU/L (ref 44–121)
BUN/Creatinine Ratio: 12 (ref 9–23)
BUN: 9 mg/dL (ref 6–24)
Bilirubin Total: 0.3 mg/dL (ref 0.0–1.2)
CO2: 26 mmol/L (ref 20–29)
Calcium: 9.3 mg/dL (ref 8.7–10.2)
Chloride: 98 mmol/L (ref 96–106)
Creatinine, Ser: 0.74 mg/dL (ref 0.57–1.00)
GFR calc Af Amer: 109 mL/min/{1.73_m2} (ref >59)
GFR calc non Af Amer: 95 mL/min/{1.73_m2} (ref >59)
Globulin, Total: 2.2 g/dL (ref 1.5–4.5)
Glucose: 100 mg/dL — ABNORMAL HIGH (ref 65–99)
Potassium: 3.9 mmol/L (ref 3.5–5.2)
Sodium: 140 mmol/L (ref 134–144)
Total Protein: 6.3 g/dL (ref 6.0–8.5)

## 2020-06-17 LAB — INSULIN, RANDOM: INSULIN: 12 u[IU]/mL (ref 2.6–24.9)

## 2020-06-17 LAB — LIPID PANEL
Chol/HDL Ratio: 2.7 ratio (ref 0.0–4.4)
Cholesterol, Total: 135 mg/dL (ref 100–199)
HDL: 50 mg/dL (ref >39)
LDL Chol Calc (NIH): 68 mg/dL (ref 0–99)
Triglycerides: 89 mg/dL (ref 0–149)
VLDL Cholesterol Cal: 17 mg/dL (ref 5–40)

## 2020-06-17 LAB — HEMOGLOBIN A1C
Est. average glucose Bld gHb Est-mCnc: 126 mg/dL
Hgb A1c MFr Bld: 6 % — ABNORMAL HIGH (ref 4.8–5.6)

## 2020-06-17 LAB — VITAMIN D 25 HYDROXY (VIT D DEFICIENCY, FRACTURES): Vit D, 25-Hydroxy: 51.4 ng/mL (ref 30.0–100.0)

## 2020-06-18 DIAGNOSIS — F4010 Social phobia, unspecified: Secondary | ICD-10-CM | POA: Diagnosis not present

## 2020-06-18 DIAGNOSIS — F341 Dysthymic disorder: Secondary | ICD-10-CM | POA: Diagnosis not present

## 2020-06-21 ENCOUNTER — Encounter (INDEPENDENT_AMBULATORY_CARE_PROVIDER_SITE_OTHER): Payer: Self-pay | Admitting: Physician Assistant

## 2020-06-21 DIAGNOSIS — E785 Hyperlipidemia, unspecified: Secondary | ICD-10-CM

## 2020-06-21 DIAGNOSIS — E1169 Type 2 diabetes mellitus with other specified complication: Secondary | ICD-10-CM

## 2020-06-22 MED ORDER — VICTOZA 18 MG/3ML ~~LOC~~ SOPN: 1.8000 mg | PEN_INJECTOR | Freq: Every day | SUBCUTANEOUS | 0 refills | Status: DC

## 2020-06-25 DIAGNOSIS — F4010 Social phobia, unspecified: Secondary | ICD-10-CM | POA: Diagnosis not present

## 2020-06-25 DIAGNOSIS — F341 Dysthymic disorder: Secondary | ICD-10-CM | POA: Diagnosis not present

## 2020-06-25 DIAGNOSIS — G4733 Obstructive sleep apnea (adult) (pediatric): Secondary | ICD-10-CM | POA: Diagnosis not present

## 2020-07-02 DIAGNOSIS — F4010 Social phobia, unspecified: Secondary | ICD-10-CM | POA: Diagnosis not present

## 2020-07-02 DIAGNOSIS — F341 Dysthymic disorder: Secondary | ICD-10-CM | POA: Diagnosis not present

## 2020-07-16 ENCOUNTER — Encounter: Payer: Self-pay | Admitting: Neurology

## 2020-07-16 ENCOUNTER — Ambulatory Visit: Payer: BC Managed Care – PPO | Admitting: Neurology

## 2020-07-16 ENCOUNTER — Other Ambulatory Visit: Payer: Self-pay

## 2020-07-16 ENCOUNTER — Telehealth: Payer: Self-pay | Admitting: *Deleted

## 2020-07-16 VITALS — BP 119/66 | HR 76 | Ht 64.0 in | Wt 273.0 lb

## 2020-07-16 DIAGNOSIS — G4733 Obstructive sleep apnea (adult) (pediatric): Secondary | ICD-10-CM | POA: Diagnosis not present

## 2020-07-16 DIAGNOSIS — Z9989 Dependence on other enabling machines and devices: Secondary | ICD-10-CM | POA: Diagnosis not present

## 2020-07-16 NOTE — Telephone Encounter (Signed)
Dr. Rexene Alberts would like to order supplies for [facility] (DOB: 30-Apr-1971). The patient says she is not been able to get supplies in the last year. She said she has been unable to obtain them due to insurance (expired PA), billing, etc issues. The patient told us that she has made multiple calls and that no one is resolving her issues. She said there has been great confusion between the [employer] and [employer] systems. The patient is willing call anyone but says she is out of options and really needs help.   I have sent this information to our [place] office representatives through a community message [employer]). I also asked them to have someone contact the patient and keep Dr. Rexene Alberts updated.

## 2020-07-16 NOTE — Progress Notes (Signed)
Subjective:    Patient ID: Lisa Curry is a 49 y.o. female.  HPI     Interim history:   Lisa Curry is a 49 year old right-handed woman with an underlying medical history of diabetes, hyperlipidemia, history of fatty liver, vitamin D deficiency, leg weakness and back pain (seen by my colleague, Dr. Jaynee Eagles on 06/13/18), and morbid obesity with BMI of over 42, who presents for follow-up consultation of her obstructive sleep apnea, on AutoPap therapy.  The patient is unaccompanied today.  I first met her on 09/25/2018 at the request of Dr. Owens Shark in medical weight management, at which time the patient reported snoring and excessive daytime somnolence.  She was advised to proceed with sleep study testing.  She had a home sleep test on 10/17/2018 which indicated moderate obstructive sleep apnea with an AHI of 20.5/h, O2 nadir of 86%.  She was advised to proceed with AutoPap therapy at home.  She saw Ward Givens, nurse practitioner in the interim on 12/04/2019 in a virtual visit, at which time her compliance for usage was 80%, percent use days greater than 4 hours was suboptimal at 57%.  Average AHI was at goal at less than 1/h and pressure range was 6 to 11 cm with EPR.  Leak was acceptable.  She reported that she would sleep in the recliner at times.    She emailed in the interim with concerns of nasal congestion.  She was encouraged to use saline nasal spray or rinses.    Today, 07/16/2020: I reviewed her AutoPap compliance data from 06/15/2020 through 07/14/2020, which is a total of 30 days, during which time she used her machine 24 days with percent used days greater than 4 hours at 53%, indicating suboptimal compliance, average usage for days on treatment of 4 hours and 11 minutes, residual AHI at goal at 0.1/h, leak very low with a 95th percentile at 0.1 L/min, average pressure for the 95th percentile at 7.3 cm with a range of 6 to 11 cm with EPR of 3.  She reports that she has had back problems and  sleeps in a recliner in the family room.  Sleep is disrupted because of other family members in the household.  Her daughter has to often get up early early and the patient wakes up early for that and sometimes she has to let the dog out.  When she sleeps in her bed, she does not have her machine next to it and when she sleeps in the recliner she may fall asleep without it on.  She is motivated to continue with treatment.  She had some trouble getting her supplies through her DME company, [employer] and is not sure if her insurance will still pay through [employer] or [employer] as she is not sure which company with her network, she has not been able to get any definitive answers from either [employer] or [employer].  She just got a new nasal mask and filters.  She also reports that she has not been very consistent in the cleaning of her mask and supplies.  The patient's allergies, current medications, family history, past medical history, past social history, past surgical history and problem list were reviewed and updated as appropriate.    Previously:   09/25/18: (She) reports snoring and excessive daytime somnolence. I reviewed her office note from 08/15/2018. She also recently saw Dr. Jaynee Eagles in our office for lumbar radiculopathy. Her Epworth sleepiness score is 11 out of 24, fatigue score  is 31 out of 63. She is married and lives with her husband, they have 2 adult children. She is a nonsmoker and drinks alcohol occasionally, caffeine in the form of coffee, usually one cup per day and soda, about 20 ounces per day on average. She has been to weight management clinic for the past 2 months and has started to lose some weight. She takes Tylenol with Codeine for back pain. She works part-time as a Oceanographer. When she has to work she gets up around 5 or 5:30. She does not typically wake up rested, bedtime is usually around 11:30 PM. They have 1 dog and 2 cats in the household and the daughter typically  sleeps on the bed with them. She sometimes wakes up due to the dog disturbing her or due to back pain and sometimes she sleeps in the recliner. She has nocturia about once per average night and has had the occasional morning headache. She had one paternal uncle with sleep apnea. She is a side sleeper typically.  Her Past Medical History Is Significant For: Past Medical History:  Diagnosis Date  . Allergy   . Anemia   . Anxiety   . Barrett esophagus    Don Bulla, have not received EGD report  . Chronic back pain   . Constipation   . Depression with anxiety   . Diabetes mellitus without complication ([place])    DSME 04/2014  . Edema, lower extremity   . Fatty liver   . Gastric polyp 05/2012   h/o EGD  . GERD (gastroesophageal reflux disease)   . History of Helicobacter pylori infection   . HLD (hyperlipidemia)   . Hyperlipidemia   . Vitamin D deficiency     Her Past Surgical History Is Significant For: Past Surgical History:  Procedure Laterality Date  . CESAREAN SECTION  6/95, 4/98  . DILATION AND CURETTAGE OF UTERUS  2011  . ESOPHAGOGASTRODUODENOSCOPY  2012?   [facility] L[facility] at [facility]  . ESOPHAGOGASTRODUODENOSCOPY  10/2017   [facility] Ar[facility] Hughs)  . LUMBAR DISC SURGERY  03/1995  . NASAL SINUS SURGERY  03/2001    Her Family History Is Significant For: Family History  Problem Relation Age of Onset  . Breast cancer Maternal Grandmother 32  . Dementia Maternal Grandmother   . Heart disease Paternal Grandfather   . Other Sister        Postural Orthostatic Tachycardia Syndrome  . Supraventricular tachycardia Sister   . Other Maternal Aunt        hysterectomy for irregular bleed with abnormal pap  . Hyperlipidemia Mother   . CAD Father        5 stents placed  . Alzheimer's disease Maternal Grandfather     Her Social History Is Significant For: Social History   Socioeconomic History  . Marital status: Married    Spouse name: W. Makhya Arave  . Number of children: 2  .  Years of education: Not on file  . Highest education level: Bachelor's degree (e.g., BA, AB, BS)  Occupational History  . Occupation: Pharmacist, hospital, care giver  Tobacco Use  . Smoking status: Never Smoker  . Smokeless tobacco: Never Used  Vaping Use  . Vaping Use: Never used  Substance and Sexual Activity  . Alcohol use: Yes    Alcohol/week: 0.0 standard drinks    Comment: occasional  . Drug use: No  . Sexual activity: Not on file  Other Topics Concern  . Not on file  Social History Narrative  Lives with husband and 2 children, 2 dogs, 2 cats and 4 ducks and 16 chickens and a fish   Occ: special ed substitute Pharmacist, hospital.   Activity: no regular exercise   Diet: good water, fruits/vegetables daily   Right handed   Caffeine: daily   Social Determinants of Health   Financial Resource Strain:   . Difficulty of Paying Living Expenses: Not on file  Food Insecurity:   . Worried About Charity fundraiser in the Last Year: Not on file  . Ran Out of Food in the Last Year: Not on file  Transportation Needs:   . Lack of Transportation (Medical): Not on file  . Lack of Transportation (Non-Medical): Not on file  Physical Activity:   . Days of Exercise per Week: Not on file  . Minutes of Exercise per Session: Not on file  Stress:   . Feeling of Stress : Not on file  Social Connections:   . Frequency of Communication with Friends and Family: Not on file  . Frequency of Social Gatherings with Friends and Family: Not on file  . Attends Religious Services: Not on file  . Active Member of Clubs or Organizations: Not on file  . Attends Archivist Meetings: Not on file  . Marital Status: Not on file    Her Allergies Are:  Allergies  Allergen Reactions  . Povidone Iodine Hives  . Biaxin [Clarithromycin] Hives  . Metformin And Related Diarrhea  :   Her Current Medications Are:  Outpatient Encounter Medications as of 07/16/2020  Medication Sig  . acetaminophen-codeine (TYLENOL #3)  300-30 MG tablet Take 1 tablet by mouth every 8 (eight) hours as needed for moderate pain.  Marland Kitchen atorvastatin (LIPITOR) 20 MG tablet Take 1 tablet (20 mg total) by mouth daily at 6 PM.  . Cholecalciferol (VITAMIN D3) 125 MCG (5000 UT) TABS Take 1 tablet by mouth daily.  . clonazePAM (KLONOPIN) 0.5 MG tablet TAKE 1 TABLET BY MOUTH TWICE A DAY AS NEEDED FOR ANXIETY (Patient taking differently: Provider suggested stopping this.  Patient did stop and now taking this again every day as of this week)  . cyclobenzaprine (FLEXERIL) 10 MG tablet Take 1 tablet (10 mg total) by mouth 2 (two) times daily as needed for muscle spasms (sedation precautions).  Marland Kitchen dexlansoprazole (DEXILANT) 60 MG capsule TAKE 1 CAPSULE BY MOUTH EVERY DAY  . gabapentin (NEURONTIN) 300 MG capsule Take 1 capsule (300 mg total) by mouth 3 (three) times daily.  Marland Kitchen glucose blood (ONE TOUCH ULTRA TEST) test strip Use to check sugar twice daily and as needed. Dx: E11.65  . ibuprofen (ADVIL,MOTRIN) 600 MG tablet Take 600 mg by mouth daily.  . Insulin Pen Needle (PEN NEEDLES) 32G X 6 MM MISC 1 each by Does not apply route daily.  Marland Kitchen lamoTRIgine (LAMICTAL) 100 MG tablet Take 100 mg by mouth at bedtime.  . lamoTRIgine (LAMICTAL) 200 MG tablet Take 1 tablet (200 mg total) by mouth daily.  Elmore Guise Devices (ONE TOUCH DELICA LANCING DEV) MISC Use as directed with lancets and one touch meter E11.65  . linaclotide (LINZESS) 145 MCG CAPS capsule Take 1 capsule (145 mcg total) by mouth daily.  Marland Kitchen liraglutide (VICTOZA) 18 MG/3ML SOPN Inject 1.8 mg into the skin daily.  . meclizine (ANTIVERT) 12.5 MG tablet Take 12.5 mg by mouth 3 (three) times daily as needed for dizziness.  . Multiple Vitamin (MULTIVITAMIN) capsule Take 1 capsule by mouth daily.  Glory Rosebush DELICA LANCETS 74B  MISC Check blood sugar twice daily and as directed. Dx E11.65  . pioglitazone (ACTOS) 30 MG tablet Take 1 tablet (30 mg total) by mouth daily.  . polyethylene glycol powder  (GLYCOLAX/MIRALAX) 17 GM/SCOOP powder Take 17 g by mouth daily.  . sertraline (ZOLOFT) 50 MG tablet Take 50 mg by mouth daily.  Marland Kitchen triamcinolone (NASACORT ALLERGY 24HR) 55 MCG/ACT AERO nasal inhaler Place 2 sprays into the nose daily.  . [DISCONTINUED] buPROPion (WELLBUTRIN XL) 300 MG 24 hr tablet Take 1 tablet (300 mg total) by mouth every morning.  . [DISCONTINUED] docusate sodium (COLACE) 100 MG capsule Take 1 capsule (100 mg total) by mouth 2 (two) times daily.  . [DISCONTINUED] fluconazole (DIFLUCAN) 150 MG tablet Take 1 tablet (150 mg total) by mouth once a week.  . [DISCONTINUED] lamoTRIgine (LAMICTAL) 25 MG tablet Take 50 mg by mouth at bedtime.  . [DISCONTINUED] nystatin cream (MYCOSTATIN) Apply 1 application topically 2 (two) times daily.  . [DISCONTINUED] venlafaxine XR (EFFEXOR XR) 37.5 MG 24 hr capsule Take two capsule for one week and than one capsule daily. (Patient taking differently: Take 37.5 mg by mouth daily with breakfast. CURRENTLY TITRATING)  . [DISCONTINUED] diclofenac sodium (VOLTAREN) 1 % transdermal gel 2 g    No facility-administered encounter medications on file as of 07/16/2020.  :  Review of Systems:  Out of a complete 14 point review of systems, all are reviewed and negative with the exception of these symptoms as listed below: Review of Systems  Neurological:       She is still not consistently wearing her CPAP machine. She has been sleeping in her recliner some nights due to back pain. She keeps her machine next to the chair and wears it then. However, when she sleeps in her bed she does not move it to the bedroom. Says she also dozes off frequently without her mask on.   Epworth Sleepiness Scale 0= would never doze 1= slight chance of dozing 2= moderate chance of dozing 3= high chance of dozing  Sitting and reading: 1 Watching TV: 1 Sitting inactive in a public place (ex. Theater or meeting): 0 As a passenger in a car for an hour without a break: 1 Lying  down to rest in the afternoon: 1 Sitting and talking to someone: 0 Sitting quietly after lunch (no alcohol): 0 In a car, while stopped in traffic: 0 Total: 4     Objective:  Neurological Exam  Physical Exam Physical Examination:   Vitals:   07/16/20 0950  BP: 119/66  Pulse: 76    General Examination: The patient is a very pleasant 49 y.o. female in no acute distress. She appears well-developed and well-nourished and well groomed.   HEENT: Normocephalic, atraumatic, pupils are equal, round and reactive to light and accommodation. Corrective eye glasses in place. Extraocular tracking is good without limitation to gaze excursion or nystagmus noted. Normal smooth pursuit is noted. Hearing is grossly intact. Face is symmetric with normal facial animation and normal facial sensation. Speech is clear with no dysarthria noted. There is no hypophonia. There is no lip, neck/head, jaw or voice tremor. Neck with FROM. There are no carotid bruits on auscultation. Oropharynx exam reveals: mild mouth dryness, adequate dental hygiene and mild airway crowding. Tongue protrudes centrally and palate elevates symmetrically.  Chest: Clear to auscultation without wheezing, rhonchi or crackles noted.  Heart: S1+S2+0, regular and normal without murmurs, rubs or gallops noted.   Abdomen: Soft, non-tender and non-distended with normal bowel sounds  appreciated on auscultation.  Extremities: There is no pitting edema in the distal lower extremities bilaterally.  Skin: Warm and dry without trophic changes noted.  Musculoskeletal: exam reveals no obvious joint deformities, tenderness or joint swelling or erythema.   Neurologically:  Mental status: The patient is awake, alert and oriented in all 4 spheres. Her immediate and remote memory, attention, language skills and fund of knowledge are appropriate. There is no evidence of aphasia, agnosia, apraxia or anomia. Speech is clear with normal prosody and  enunciation. Thought process is linear. Mood is normal and affect is normal.  Cranial nerves II - XII are as described above under HEENT exam. Motor exam: Normal bulk, strength and tone is noted. There is no drift, tremor or rebound. Fine motor skills and coordination: grossly intact.  Cerebellar testing: No dysmetria or intention tremor on finger to nose testing. Heel to shin is unremarkable bilaterally. There is no truncal or gait ataxia.  Sensory exam: intact to light touch in the upper and lower extremities.  Gait, station and balance: She stands easily. No veering to one side is noted. No leaning to one side is noted. Posture is age-appropriate and stance is narrow based. Gait shows normal stride length and normal pace. No problems turning are noted.        Assessment and Plan:  In summary, DOROTHE ELMORE is a very pleasant 49 year old female  with an underlying medical history of diabetes, hyperlipidemia, history of fatty liver, vitamin D deficiency, and morbid obesity with BMI of over 57, who presents for follow-up consultation of her obstructive sleep apnea.  Home sleep testing indicated moderate sleep apnea and she has been on AutoPap therapy.  She has had some difficulty with compliance.  She has had back pain and often ends up sleeping in a recliner in the family room but also has a tendency to fall asleep without it on.  When she is able to sleep in her bedroom she does not move the machine into the bedroom.  She is reminded to be very careful with her cleanliness on the machine and also with replacing the supplies including mask and filters on a regular basis.  She is also advised to wash out the water chamber.  She is advised to stay in touch with her insurance and DME provider, [employer] to make sure she is still in network with them.  We will also email them from our end.  I offered her a prescription for CPAP related supplies but she would like to hold off until she knows which company  actually is in network for her insurance.  I can always put a separate prescription in for her AutoPap supplies.  She is advised to be fully compliant with treatment and try to get back on track with it.  She is advised to follow-up routinely in 6 months to see one of our nurse practitioners.  I answered all her questions today and she was in agreement. I spent 30 minutes in total face-to-face time and in reviewing records during pre-charting, more than 50% of which was spent in counseling and coordination of care, reviewing test results, reviewing medications and treatment regimen and/or in discussing or reviewing the diagnosis of OSA, the prognosis and treatment options. Pertinent laboratory and imaging test results that were available during this visit with the patient were reviewed by me and considered in my medical decision making (see chart for details).

## 2020-07-16 NOTE — Patient Instructions (Addendum)
Please continue using your autoPAP regularly. While your insurance requires that you use PAP at least 4 hours each night on 70% of the nights, I recommend, that you not skip any nights and use it throughout the night if you can. Getting used to PAP and staying with the treatment long term does take time and patience and discipline. Untreated obstructive sleep apnea when it is moderate to severe can have an adverse impact on cardiovascular health and raise her risk for heart disease, arrhythmias, hypertension, congestive heart failure, stroke and diabetes. Untreated obstructive sleep apnea causes sleep disruption, nonrestorative sleep, and sleep deprivation. This can have an impact on your day to day functioning and cause daytime sleepiness and impairment of cognitive function, memory loss, mood disturbance, and problems focussing. Using PAP regularly can improve these symptoms.  We will talk to [facility] and also send an email to both [facility] and [facility] regarding your supply prescription and where your supplies should be coming from and whether or not they are covered by your insurance.  Please follow-up routinely to see Ward Givens, nurse practitioner in 6 months.

## 2020-07-17 DIAGNOSIS — F419 Anxiety disorder, unspecified: Secondary | ICD-10-CM | POA: Insufficient documentation

## 2020-07-17 DIAGNOSIS — F32A Depression, unspecified: Secondary | ICD-10-CM | POA: Insufficient documentation

## 2020-07-17 DIAGNOSIS — G8929 Other chronic pain: Secondary | ICD-10-CM | POA: Insufficient documentation

## 2020-07-17 DIAGNOSIS — Z1889 Other specified retained foreign body fragments: Secondary | ICD-10-CM | POA: Diagnosis not present

## 2020-07-17 DIAGNOSIS — K589 Irritable bowel syndrome without diarrhea: Secondary | ICD-10-CM | POA: Insufficient documentation

## 2020-07-20 DIAGNOSIS — F39 Unspecified mood [affective] disorder: Secondary | ICD-10-CM | POA: Diagnosis not present

## 2020-07-20 DIAGNOSIS — F5105 Insomnia due to other mental disorder: Secondary | ICD-10-CM | POA: Diagnosis not present

## 2020-07-20 DIAGNOSIS — F411 Generalized anxiety disorder: Secondary | ICD-10-CM | POA: Diagnosis not present

## 2020-07-23 DIAGNOSIS — F4010 Social phobia, unspecified: Secondary | ICD-10-CM | POA: Diagnosis not present

## 2020-07-23 DIAGNOSIS — F341 Dysthymic disorder: Secondary | ICD-10-CM | POA: Diagnosis not present

## 2020-07-29 ENCOUNTER — Other Ambulatory Visit: Payer: Self-pay

## 2020-07-29 ENCOUNTER — Ambulatory Visit (INDEPENDENT_AMBULATORY_CARE_PROVIDER_SITE_OTHER): Payer: BC Managed Care – PPO | Admitting: Physician Assistant

## 2020-07-29 ENCOUNTER — Encounter (INDEPENDENT_AMBULATORY_CARE_PROVIDER_SITE_OTHER): Payer: Self-pay | Admitting: Physician Assistant

## 2020-07-29 VITALS — BP 125/68 | HR 72 | Temp 97.6°F | Ht 64.0 in | Wt 265.0 lb

## 2020-07-29 DIAGNOSIS — E1169 Type 2 diabetes mellitus with other specified complication: Secondary | ICD-10-CM | POA: Diagnosis not present

## 2020-07-29 DIAGNOSIS — E559 Vitamin D deficiency, unspecified: Secondary | ICD-10-CM | POA: Diagnosis not present

## 2020-07-29 DIAGNOSIS — E785 Hyperlipidemia, unspecified: Secondary | ICD-10-CM

## 2020-07-29 DIAGNOSIS — Z6841 Body Mass Index (BMI) 40.0 and over, adult: Secondary | ICD-10-CM

## 2020-07-29 DIAGNOSIS — Z9189 Other specified personal risk factors, not elsewhere classified: Secondary | ICD-10-CM | POA: Diagnosis not present

## 2020-07-29 MED ORDER — VICTOZA 18 MG/3ML ~~LOC~~ SOPN: 1.8000 mg | PEN_INJECTOR | Freq: Every day | SUBCUTANEOUS | 0 refills | Status: DC

## 2020-07-29 NOTE — Progress Notes (Signed)
Chief Complaint:   Lisa Curry is here to discuss her progress with her obesity treatment plan along with follow-up of her obesity related diagnoses. Lisa Curry is practicing portion control and making smarter food choices, such as increasing vegetables and decreasing simple carbohydrates and states she is following her eating plan approximately 25% of the time. Lisa Curry states she is doing weights 30 minutes 2-5 times per week.  Today's visit was #: 30 Starting weight: 279 lbs Starting date: 07/26/2018 Today's weight: 265 lbs Today's date: 07/29/2020 Total lbs lost to date: 14 Total lbs lost since last in-office visit: 5  Interim History: Lisa Curry notes that her appetite has decreased and she has noticed a lack of hunger recently. She thinks she may be slightly depressed. No suicidal or homicidal ideations. She sees a psychiatrist every 3 months and had a follow-up with him last week. She tends to get cravings between lunch and dinner.  Subjective:   Type 2 diabetes mellitus with hyperlipidemia ([place]). Last A1c improved from 6.2 to 6.0. Lisa Curry is on Victoza and Actos and is managed by her PCP. Fasting blood sugars 109-150.   Lab Results  Component Value Date   HGBA1C 6.0 (H) 06/16/2020   HGBA1C 6.2 (H) 12/10/2019   HGBA1C 6.2 (H) 08/01/2019   Lab Results  Component Value Date   MICROALBUR <0.7 05/16/2018   LDLCALC 68 06/16/2020   CREATININE 0.74 06/16/2020   Lab Results  Component Value Date   INSULIN 12.0 06/16/2020   INSULIN 9.4 12/10/2019   INSULIN 9.8 11/28/2018   INSULIN 20.0 07/26/2018   Vitamin D deficiency. Last Vitamin D decreased to 51.4 from 62.0. Lively is on OTC Vitamin D 4,000 units daily.   Ref. Range 06/16/2020 12:06  Vitamin D, 25-Hydroxy Latest Ref Range: 30.0 - 100.0 ng/mL 51.4   At risk for hypoglycemia. Lisa Curry is at increased risk for hypoglycemia due to changes in diet, diagnosis of diabetes, and/or insulin use.  Assessment/Plan:   Type 2 diabetes  mellitus with hyperlipidemia ([place]). Good blood sugar control is important to decrease the likelihood of diabetic complications such as nephropathy, neuropathy, limb loss, blindness, coronary artery disease, and death. Intensive lifestyle modification including diet, exercise and weight loss are the first line of treatment for diabetes. Refill was given for liraglutide (VICTOZA) 18 MG/3ML SOPN #3 with 0 refills.  Vitamin D deficiency. Low Vitamin D level contributes to fatigue and are associated with obesity, breast, and colon cancer. She agrees to take OTC Vitamin D 5,000 units daily and will follow-up for routine testing of Vitamin D, at least 2-3 times per year to avoid over-replacement.  At risk for hypoglycemia. Lisa Curry was given approximately 15 minutes of counseling today regarding prevention of hypoglycemia. She was advised of symptoms of hypoglycemia. Lisa Curry was instructed to avoid skipping meals, eat regular protein rich meals and schedule low calorie snacks as needed.   Repetitive spaced learning was employed today to elicit superior memory formation and behavioral change.   Class 3 severe obesity with serious comorbidity and body mass index (BMI) of 45.0 to 49.9 in adult, unspecified obesity type ([place]).  Lisa Curry is currently in the action stage of change. As such, her goal is to continue with weight loss efforts. She has agreed to practicing portion control and making smarter food choices, such as increasing vegetables and decreasing simple carbohydrates.   Exercise goals: For substantial health benefits, adults should do at least 150 minutes (2 hours and 30 minutes) a week of  moderate-intensity, or 75 minutes (1 hour and 15 minutes) a week of vigorous-intensity aerobic physical activity, or an equivalent combination of moderate- and vigorous-intensity aerobic activity. Aerobic activity should be performed in episodes of at least 10 minutes, and preferably, it should be spread throughout the  week.  Behavioral modification strategies: meal planning and cooking strategies and emotional eating strategies.  Lisa Curry has agreed to follow-up with our clinic in 8 weeks. She was informed of the importance of frequent follow-up visits to maximize her success with intensive lifestyle modifications for her multiple health conditions.   Objective:   Blood pressure 125/68, pulse 72, temperature 97.6 F (36.4 C), height 5\' 4"  (1.626 m), weight 265 lb (120.2 kg), SpO2 98 %. Body mass index is 45.49 kg/m.  General: Cooperative, alert, well developed, in no acute distress. HEENT: Conjunctivae and lids unremarkable. Cardiovascular: Regular rhythm.  Lungs: Normal work of breathing. Neurologic: No focal deficits.   Lab Results  Component Value Date   CREATININE 0.74 06/16/2020   BUN 9 06/16/2020   NA 140 06/16/2020   K 3.9 06/16/2020   CL 98 06/16/2020   CO2 26 06/16/2020   Lab Results  Component Value Date   ALT 17 06/16/2020   AST 18 06/16/2020   ALKPHOS 109 06/16/2020   BILITOT 0.3 06/16/2020   Lab Results  Component Value Date   HGBA1C 6.0 (H) 06/16/2020   HGBA1C 6.2 (H) 12/10/2019   HGBA1C 6.2 (H) 08/01/2019   HGBA1C 5.9 (H) 04/22/2019   HGBA1C 5.9 (H) 11/28/2018   Lab Results  Component Value Date   INSULIN 12.0 06/16/2020   INSULIN 9.4 12/10/2019   INSULIN 9.8 11/28/2018   INSULIN 20.0 07/26/2018   Lab Results  Component Value Date   TSH 2.330 12/10/2019   Lab Results  Component Value Date   CHOL 135 06/16/2020   HDL 50 06/16/2020   LDLCALC 68 06/16/2020   LDLDIRECT 73.0 04/18/2017   TRIG 89 06/16/2020   CHOLHDL 2.7 06/16/2020   Lab Results  Component Value Date   WBC 9.8 01/27/2020   HGB 13.4 01/27/2020   HCT 40.8 01/27/2020   MCV 91.1 01/27/2020   PLT 279.0 01/27/2020   Lab Results  Component Value Date   IRON 62 05/30/2016   FERRITIN 55.2 05/30/2016   Attestation Statements:   Reviewed by clinician on day of visit: allergies, medications,  problem list, medical history, surgical history, family history, social history, and previous encounter notes.  IMichaelene Song, am acting as transcriptionist for Abby Potash, PA-C   I have reviewed the above documentation for accuracy and completeness, and I agree with the above. Abby Potash, PA-C

## 2020-07-30 DIAGNOSIS — F4010 Social phobia, unspecified: Secondary | ICD-10-CM | POA: Diagnosis not present

## 2020-07-30 DIAGNOSIS — F341 Dysthymic disorder: Secondary | ICD-10-CM | POA: Diagnosis not present

## 2020-08-04 ENCOUNTER — Encounter: Payer: Self-pay | Admitting: Family Medicine

## 2020-08-04 MED ORDER — DEXILANT 60 MG PO CPDR
DELAYED_RELEASE_CAPSULE | ORAL | 0 refills | Status: DC
Start: 2020-08-04 — End: 2021-01-12

## 2020-08-04 NOTE — Telephone Encounter (Signed)
E-scribed refill.  Plz schedule cpe and lab visits.  

## 2020-08-05 ENCOUNTER — Telehealth: Payer: Self-pay | Admitting: Family Medicine

## 2020-08-05 NOTE — Telephone Encounter (Signed)
Patient called back she is not wanting to come in for physical. She states that she believes she has already been in for her yearly physical. Patient states that she been [facility]. She is worried about ins not paying for it. I told her that I would send this to be looked at and to see if Dr Darnell Level would do it as a 6 month followup and not a physical. She states that she recently had blood work at the wellness center. Please advise. Best patient contact phone # is [phone] EM

## 2020-08-05 NOTE — Telephone Encounter (Signed)
Last physical was 09/18/2019. Recommend schedule CPE after 09/17/2020.  [facility].

## 2020-08-06 NOTE — Telephone Encounter (Signed)
Called patient again today. I had to leave a message for her to call back to schedule cpe and labs. EM 08/06/20

## 2020-08-06 NOTE — Telephone Encounter (Signed)
error 

## 2020-08-06 NOTE — Telephone Encounter (Signed)
Pt schedule 1/12 for cpx She didn't want to schedule labs she stated [facility]

## 2020-08-07 NOTE — Telephone Encounter (Signed)
Noted.  FYI to Dr. G. 

## 2020-08-07 NOTE — Telephone Encounter (Signed)
Noted.  Pt scheduled on 09/30/20 at 3:00.

## 2020-08-12 ENCOUNTER — Other Ambulatory Visit: Payer: Self-pay | Admitting: Family Medicine

## 2020-08-12 ENCOUNTER — Encounter: Payer: Self-pay | Admitting: Family Medicine

## 2020-08-12 NOTE — Telephone Encounter (Signed)
Flexeril Last filled:  01/07/20, #30 Last OV:  01/03/20, MDD f/u Next OV:  09/30/20, CPE

## 2020-09-01 DIAGNOSIS — F4010 Social phobia, unspecified: Secondary | ICD-10-CM | POA: Diagnosis not present

## 2020-09-01 DIAGNOSIS — F341 Dysthymic disorder: Secondary | ICD-10-CM | POA: Diagnosis not present

## 2020-09-07 DIAGNOSIS — G4733 Obstructive sleep apnea (adult) (pediatric): Secondary | ICD-10-CM | POA: Diagnosis not present

## 2020-09-09 ENCOUNTER — Encounter: Payer: Self-pay | Admitting: Family Medicine

## 2020-09-09 DIAGNOSIS — Z79891 Long term (current) use of opiate analgesic: Secondary | ICD-10-CM | POA: Diagnosis not present

## 2020-09-09 DIAGNOSIS — M5136 Other intervertebral disc degeneration, lumbar region: Secondary | ICD-10-CM | POA: Diagnosis not present

## 2020-09-09 DIAGNOSIS — M961 Postlaminectomy syndrome, not elsewhere classified: Secondary | ICD-10-CM | POA: Diagnosis not present

## 2020-09-09 DIAGNOSIS — K5909 Other constipation: Secondary | ICD-10-CM

## 2020-09-09 MED ORDER — LINACLOTIDE 145 MCG PO CAPS: 145.0000 ug | ORAL_CAPSULE | Freq: Every day | ORAL | 0 refills | Status: DC

## 2020-09-09 NOTE — Telephone Encounter (Signed)
E-scribed 90-day rx to [facility] mail order.

## 2020-09-10 ENCOUNTER — Other Ambulatory Visit (INDEPENDENT_AMBULATORY_CARE_PROVIDER_SITE_OTHER): Payer: Self-pay | Admitting: Physician Assistant

## 2020-09-10 DIAGNOSIS — E1169 Type 2 diabetes mellitus with other specified complication: Secondary | ICD-10-CM

## 2020-09-10 DIAGNOSIS — E785 Hyperlipidemia, unspecified: Secondary | ICD-10-CM

## 2020-09-15 ENCOUNTER — Other Ambulatory Visit: Payer: Self-pay | Admitting: Family Medicine

## 2020-09-15 DIAGNOSIS — F341 Dysthymic disorder: Secondary | ICD-10-CM | POA: Diagnosis not present

## 2020-09-15 DIAGNOSIS — F4010 Social phobia, unspecified: Secondary | ICD-10-CM | POA: Diagnosis not present

## 2020-09-15 NOTE — Telephone Encounter (Signed)
Pharmacy requests refill on: Atorvastatin 20 mg & Pioglitazone 30 mg   LAST REFILL: 09/18/2019 (Q-90, R-3) LAST OV: 01/03/2020 NEXT OV: 09/30/2020 PHARMACY: [id number] [place], [place]  Hgb A1C (06/16/2020): 6.0

## 2020-09-23 ENCOUNTER — Ambulatory Visit (INDEPENDENT_AMBULATORY_CARE_PROVIDER_SITE_OTHER): Payer: BC Managed Care – PPO | Admitting: Physician Assistant

## 2020-09-24 DIAGNOSIS — F341 Dysthymic disorder: Secondary | ICD-10-CM | POA: Diagnosis not present

## 2020-09-24 DIAGNOSIS — F4010 Social phobia, unspecified: Secondary | ICD-10-CM | POA: Diagnosis not present

## 2020-09-30 ENCOUNTER — Ambulatory Visit (INDEPENDENT_AMBULATORY_CARE_PROVIDER_SITE_OTHER): Payer: BC Managed Care – PPO | Admitting: Family Medicine

## 2020-09-30 ENCOUNTER — Encounter: Payer: Self-pay | Admitting: Family Medicine

## 2020-09-30 ENCOUNTER — Other Ambulatory Visit: Payer: Self-pay

## 2020-09-30 VITALS — BP 130/60 | HR 93 | Temp 96.0°F | Ht 64.0 in | Wt 267.1 lb

## 2020-09-30 DIAGNOSIS — Z0001 Encounter for general adult medical examination with abnormal findings: Secondary | ICD-10-CM

## 2020-09-30 DIAGNOSIS — K429 Umbilical hernia without obstruction or gangrene: Secondary | ICD-10-CM

## 2020-09-30 DIAGNOSIS — Z1211 Encounter for screening for malignant neoplasm of colon: Secondary | ICD-10-CM | POA: Diagnosis not present

## 2020-09-30 DIAGNOSIS — K219 Gastro-esophageal reflux disease without esophagitis: Secondary | ICD-10-CM

## 2020-09-30 DIAGNOSIS — K5909 Other constipation: Secondary | ICD-10-CM

## 2020-09-30 DIAGNOSIS — F331 Major depressive disorder, recurrent, moderate: Secondary | ICD-10-CM

## 2020-09-30 DIAGNOSIS — K760 Fatty (change of) liver, not elsewhere classified: Secondary | ICD-10-CM

## 2020-09-30 DIAGNOSIS — Z23 Encounter for immunization: Secondary | ICD-10-CM

## 2020-09-30 DIAGNOSIS — E785 Hyperlipidemia, unspecified: Secondary | ICD-10-CM

## 2020-09-30 DIAGNOSIS — Z6841 Body Mass Index (BMI) 40.0 and over, adult: Secondary | ICD-10-CM

## 2020-09-30 DIAGNOSIS — E559 Vitamin D deficiency, unspecified: Secondary | ICD-10-CM

## 2020-09-30 DIAGNOSIS — E119 Type 2 diabetes mellitus without complications: Secondary | ICD-10-CM

## 2020-09-30 DIAGNOSIS — G4733 Obstructive sleep apnea (adult) (pediatric): Secondary | ICD-10-CM

## 2020-09-30 NOTE — Progress Notes (Signed)
Patient ID: Lisa Curry, female    DOB: [birth year], 50 y.o.   MRN: [id number]  This visit was conducted in person.  BP 130/60 (BP Location: Right Arm, Patient Position: Sitting, Cuff Size: Large)   Pulse 93   Temp (!) 96 F (35.6 C) (Temporal)   Ht 5\' 4"  (1.626 m)   Wt 267 lb 1.6 oz (121.2 kg)   SpO2 96%   BMI 45.85 kg/m    CC: CPE Subjective:   HPI: Lisa Curry is a 50 y.o. female presenting on 09/30/2020 for Annual Exam   OSA on CPAP followed by neurology. [facility]. Deciding on buying vs renting CPAP.   [facility]   [facility]2017 - WNL  Chronic constipation managed with linzess daily to QOD.   DM - stable period on actos and victoza  Daughter has moved out, she is learning to adjust to this.   Depression/anxiety - receiving treatment through psych (Dr Nicolasa Ducking)  Preventative: Colon cancer screening - discussed options - did not complete iFOB last year. Agrees to colonoscopy - referral placed.  Mammogram: 02/2020 [facility] Humphrey.  Pap Smear: 2012, 2015, 2016, 2019 WNL (absent transformation zone).Will rpt next physical.  LMP 09/17/2020, monthly regularly FIE:PPIRJJ COVID vaccine J&J 12/2019 Pneumovax 2015 Tetanus: >10 yrs - agrees to rpt today  Shingrix - discussed, to consider  Seat belt use discussed  Sunscreen use discussed. No changing moles on skin. Non smoker  Alcohol - rare Eyeexam: every otheryear. 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 Activity: no regular exercise  Diet: good water, fruits/vegetables daily     Relevant past medical, surgical, family and social history reviewed and updated as indicated. Interim medical history since our last visit reviewed. Allergies and medications reviewed and  updated. Outpatient Medications Prior to Visit  Medication Sig Dispense Refill  . acetaminophen-codeine (TYLENOL #3) 300-30 MG tablet Take 1 tablet by mouth every 8 (eight) hours as needed for moderate pain. 15 tablet 0  . atorvastatin (LIPITOR) 20 MG tablet TAKE 1 TABLET (20 MG TOTAL) BY MOUTH DAILY AT 6 PM. 90 tablet 1  . Cholecalciferol (VITAMIN D3) 125 MCG (5000 UT) TABS Take 1 tablet by mouth daily.    . clonazePAM (KLONOPIN) 0.5 MG tablet TAKE 1 TABLET BY MOUTH TWICE A DAY AS NEEDED FOR ANXIETY (Patient taking differently: Provider suggested stopping this.  Patient did stop and now taking this again every day as of this week) 60 tablet 0  . cyclobenzaprine (FLEXERIL) 10 MG tablet TAKE 1 TABLET BY MOUTH 2 (TWO) TIMES DAILY AS NEEDED FOR MUSCLE SPASMS (SEDATION PRECAUTIONS). 30 tablet 1  . dexlansoprazole (DEXILANT) 60 MG capsule TAKE 1 CAPSULE BY MOUTH EVERY DAY 90 capsule 0  . gabapentin (NEURONTIN) 300 MG capsule Take 1 capsule (300 mg total) by mouth 3 (three) times daily.    Marland Kitchen glucose blood (ONE TOUCH ULTRA TEST) test strip Use to check sugar twice daily and as needed. Dx: E11.65 300 each 3  . ibuprofen (ADVIL,MOTRIN) 600 MG tablet Take 600 mg by mouth daily.    . Insulin Pen Needle (PEN NEEDLES) 32G X 6 MM MISC 1 each by Does not apply route daily. 100 each 0  . lamoTRIgine (LAMICTAL) 100 MG tablet Take 100 mg by mouth at bedtime.    Marland Kitchen  lamoTRIgine (LAMICTAL) 200 MG tablet Take 1 tablet (200 mg total) by mouth daily. 30 tablet 3  . Lancet Devices (ONE TOUCH DELICA LANCING DEV) MISC Use as directed with lancets and one touch meter E11.65 1 each 0  . linaclotide (LINZESS) 145 MCG CAPS capsule Take 1 capsule (145 mcg total) by mouth daily. 90 capsule 0  . liraglutide (VICTOZA) 18 MG/3ML SOPN Inject 1.8 mg into the skin daily. 27 mL 0  . meclizine (ANTIVERT) 12.5 MG tablet Take 12.5 mg by mouth 3 (three) times daily as needed for dizziness.    . Multiple Vitamin (MULTIVITAMIN) capsule Take 1  capsule by mouth daily.    Glory Rosebush DELICA LANCETS [id number] MISC Check blood sugar twice daily and as directed. Dx E11.65 200 each 0  . pioglitazone (ACTOS) 30 MG tablet TAKE 1 TABLET BY MOUTH EVERY DAY 90 tablet 1  . polyethylene glycol powder (GLYCOLAX/MIRALAX) 17 GM/SCOOP powder Take 17 g by mouth daily. 3350 g 0  . sertraline (ZOLOFT) 50 MG tablet Take 50 mg by mouth daily.    Marland Kitchen triamcinolone (NASACORT) 55 MCG/ACT AERO nasal inhaler Place 2 sprays into the nose daily.     No facility-administered medications prior to visit.     Per HPI unless specifically indicated in ROS section below Review of Systems  Constitutional: Negative for activity change, appetite change, chills, fatigue, fever and unexpected weight change.  HENT: Negative for hearing loss.   Eyes: Negative for visual disturbance.  Respiratory: Negative for cough, chest tightness, shortness of breath and wheezing.   Cardiovascular: Negative for chest pain, palpitations and leg swelling.  Gastrointestinal: Negative for abdominal distention, abdominal pain, blood in stool, constipation, diarrhea, nausea and vomiting.  Genitourinary: Negative for difficulty urinating and hematuria.  Musculoskeletal: Negative for arthralgias, myalgias and neck pain.  Skin: Negative for rash.  Neurological: Negative for dizziness, seizures, syncope and headaches.  Hematological: Negative for adenopathy. Does not bruise/bleed easily.  Psychiatric/Behavioral: Positive for dysphoric mood. The patient is nervous/anxious.    Objective:  BP 130/60 (BP Location: Right Arm, Patient Position: Sitting, Cuff Size: Large)   Pulse 93   Temp (!) 96 F (35.6 C) (Temporal)   Ht 5\' 4"  (1.626 m)   Wt 267 lb 1.6 oz (121.2 kg)   SpO2 96%   BMI 45.85 kg/m   Wt Readings from Last 3 Encounters:  09/30/20 267 lb 1.6 oz (121.2 kg)  07/29/20 265 lb (120.2 kg)  07/16/20 273 lb (123.8 kg)      Physical Exam Vitals and nursing note reviewed.  Constitutional:       General: She is not in acute distress.    Appearance: Normal appearance. She is well-developed and well-nourished. She is not ill-appearing.  HENT:     Head: Normocephalic and atraumatic.     Right Ear: Hearing, tympanic membrane, ear canal and external ear normal.     Left Ear: Hearing, tympanic membrane, ear canal and external ear normal.     Mouth/Throat:     Mouth: Oropharynx is clear and moist and mucous membranes are normal.     Pharynx: No posterior oropharyngeal edema.  Eyes:     General: No scleral icterus.    Extraocular Movements: Extraocular movements intact and EOM normal.     Conjunctiva/sclera: Conjunctivae normal.     Pupils: Pupils are equal, round, and reactive to light.  Neck:     Thyroid: No thyroid mass or thyromegaly.  Cardiovascular:     Rate and Rhythm: Normal  rate and regular rhythm.     Pulses: Normal pulses and intact distal pulses.          Radial pulses are 2+ on the right side and 2+ on the left side.     Heart sounds: Normal heart sounds. No murmur heard.   Pulmonary:     Effort: Pulmonary effort is normal. No respiratory distress.     Breath sounds: Normal breath sounds. No wheezing, rhonchi or rales.  Abdominal:     General: Abdomen is flat. Bowel sounds are normal. There is no distension.     Palpations: Abdomen is soft. There is no mass.     Tenderness: There is no abdominal tenderness. There is no guarding or rebound.     Hernia: A hernia is present. Hernia is present in the umbilical area.  Musculoskeletal:        General: No edema. Normal range of motion.     Cervical back: Normal range of motion and neck supple.     Right lower leg: No edema.     Left lower leg: No edema.  Lymphadenopathy:     Cervical: No cervical adenopathy.  Skin:    General: Skin is warm and dry.     Findings: No rash.  Neurological:     General: No focal deficit present.     Mental Status: She is alert and oriented to person, place, and time.     Comments: CN  grossly intact, station and gait intact  Psychiatric:        Mood and Affect: Mood and affect and mood normal.        Behavior: Behavior normal.        Thought Content: Thought content normal.        Judgment: Judgment normal.       Results for orders placed or performed in visit on 06/16/20  Hemoglobin A1c  Result Value Ref Range   Hgb A1c MFr Bld 6.0 (H) 4.8 - 5.6 %   Est. average glucose Bld gHb Est-mCnc 126 mg/dL  Comprehensive metabolic panel  Result Value Ref Range   Glucose 100 (H) 65 - 99 mg/dL   BUN 9 6 - 24 mg/dL   Creatinine, Ser 0.74 0.57 - 1.00 mg/dL   GFR calc non Af Amer 95 >59 mL/min/1.73   GFR calc Af Amer 109 >59 mL/min/1.73   BUN/Creatinine Ratio 12 9 - 23   Sodium 140 134 - 144 mmol/L   Potassium 3.9 3.5 - 5.2 mmol/L   Chloride 98 96 - 106 mmol/L   CO2 26 20 - 29 mmol/L   Calcium 9.3 8.7 - 10.2 mg/dL   Total Protein 6.3 6.0 - 8.5 g/dL   Albumin 4.1 3.8 - 4.8 g/dL   Globulin, Total 2.2 1.5 - 4.5 g/dL   Albumin/Globulin Ratio 1.9 1.2 - 2.2   Bilirubin Total 0.3 0.0 - 1.2 mg/dL   Alkaline Phosphatase 109 44 - 121 IU/L   AST 18 0 - 40 IU/L   ALT 17 0 - 32 IU/L  Lipid panel  Result Value Ref Range   Cholesterol, Total 135 100 - 199 mg/dL   Triglycerides 89 0 - 149 mg/dL   HDL 50 >39 mg/dL   VLDL Cholesterol Cal 17 5 - 40 mg/dL   LDL Chol Calc (NIH) 68 0 - 99 mg/dL   Chol/HDL Ratio 2.7 0.0 - 4.4 ratio  VITAMIN D 25 Hydroxy (Vit-D Deficiency, Fractures)  Result Value Ref Range   Vit D,  25-Hydroxy 51.4 30.0 - 100.0 ng/mL  Insulin, random  Result Value Ref Range   INSULIN 12.0 2.6 - 24.9 uIU/mL   Lab Results  Component Value Date   WBC 9.8 01/27/2020   HGB 13.4 01/27/2020   HCT 40.8 01/27/2020   MCV 91.1 01/27/2020   PLT 279.0 01/27/2020    Assessment & Plan:  This visit occurred during the SARS-CoV-2 public health emergency.  Safety protocols were in place, including screening questions prior to the visit, additional usage of staff PPE, and  extensive cleaning of exam room while observing appropriate contact time as indicated for disinfecting solutions.   Problem List Items Addressed This Visit    Vitamin D deficiency    Levels stable on replacement.       Umbilical hernia    Enlarged over the past 6 months but remaining asymptomatic - will refer to gen surg for eval.       Relevant Orders   Ambulatory referral to General Surgery   Type 2 diabetes mellitus without complication, without long-term current use of insulin ([facility])    Chronic, stable. Continue current regimen of victoza and actos.  Trouble tolerating metformin.       OSA (obstructive sleep apnea)    Sees neurology, on CPAP.  DME is [facility].       Morbid obesity with BMI of 45.0-49.9, adult ([place])    Encouraged healthy diet and lifestyle choices to affect sustainable weight loss.       MDD (major depressive disorder), recurrent episode, moderate ([place])    Established with psych (Dr Nicolasa Ducking) - continue lamictal, klonopin, sertraline.       HLD (hyperlipidemia)    Chronic, stable on atorvastatin - continue The 10-year ASCVD risk score Mikey Bussing DC Jr., et al., 2013) is: 1.6%   Values used to calculate the score:     Age: 7 years     Sex: Female     Is Non-Hispanic African American: No     Diabetic: Yes     Tobacco smoker: No     Systolic Blood Pressure: 782 mmHg     Is BP treated: No     HDL Cholesterol: 50 mg/dL     Total Cholesterol: 135 mg/dL       GERD (gastroesophageal reflux disease)    Continues dexilant.       Fatty liver    Continue actos.       Encounter for routine adult medical exam with abnormal findings    Preventative protocols reviewed and updated unless pt declined. Discussed healthy diet and lifestyle.       Chronic constipation    Continues miralax and linzess.        Other Visit Diagnoses    Need for immunization against influenza    -  Primary   Relevant Orders   Flu Vaccine QUAD 36+ mos IM (Completed)   Special  screening for malignant neoplasms, colon       Relevant Orders   Ambulatory referral to Gastroenterology   Need for Tdap vaccination       Relevant Orders   Tdap vaccine greater than or equal to 7yo IM (Completed)       No orders of the defined types were placed in this encounter.  Orders Placed This Encounter  Procedures  . Flu Vaccine QUAD 36+ mos IM  . Tdap vaccine greater than or equal to 7yo IM  . Ambulatory referral to Gastroenterology    Referral Priority:   Routine  Referral Type:   Consultation    Referral Reason:   Specialty Services Required    Number of Visits Requested:   1  . Ambulatory referral to General Surgery    Referral Priority:   Routine    Referral Type:   Surgical    Referral Reason:   Specialty Services Required    Requested Specialty:   General Surgery    Number of Visits Requested:   1    Patient instructions: Flu shot today  Tetanus (Tdap) shot today.  We will refer you for colonoscopy.  We will refer you to general surgery to check umbilical hernia.  Consider COVID booster.  Consider shingrix 2 shot series.  Return in 6 months for diabetes check   Follow up plan: Return in about 6 months (around 03/30/2021) for follow up visit, annual exam, prior fasting for blood work.  Ria Bush, MD

## 2020-09-30 NOTE — Patient Instructions (Addendum)
Flu shot today  Tetanus (Tdap) shot today.  We will refer you for colonoscopy.  We will refer you to general surgery to check umbilical hernia.  Consider COVID booster.  Consider shingrix 2 shot series.  Return in 6 months for diabetes check   Umbilical Hernia, Adult  A hernia is a bulge of tissue that pushes through an opening between muscles. An umbilical hernia happens in the abdomen, near the belly button (umbilicus). The hernia may contain tissues from the small intestine, large intestine, or fatty tissue covering the intestines (omentum). Umbilical hernias in adults tend to get worse over time, and they require surgical treatment. There are several types of umbilical hernias. You may have:  A hernia located just above or below the umbilicus (indirect hernia). This is the most common type of umbilical hernia in adults.  A hernia that forms through an opening formed by the umbilicus (direct hernia).  A hernia that comes and goes (reducible hernia). A reducible hernia may be visible only when you strain, lift something heavy, or cough. This type of hernia can be pushed back into the abdomen (reduced).  A hernia that traps abdominal tissue inside the hernia (incarcerated hernia). This type of hernia cannot be reduced.  A hernia that cuts off blood flow to the tissues inside the hernia (strangulated hernia). The tissues can start to die if this happens. This type of hernia requires emergency treatment. What are the causes? An umbilical hernia happens when tissue inside the abdomen presses on a weak area of the abdominal muscles. What increases the risk? You may have a greater risk of this condition if you:  Are obese.  Have had several pregnancies.  Have a buildup of fluid inside your abdomen (ascites).  Have had surgery that weakens the abdominal muscles. What are the signs or symptoms? The main symptom of this condition is a painless bulge at or near the belly button. A  reducible hernia may be visible only when you strain, lift something heavy, or cough. Other symptoms may include:  Dull pain.  A feeling of pressure. Symptoms of a strangulated hernia may include:  Pain that gets increasingly worse.  Nausea and vomiting.  Pain when pressing on the hernia.  Skin over the hernia becoming red or purple.  Constipation.  Blood in the stool. How is this diagnosed? This condition may be diagnosed based on:  A physical exam. You may be asked to cough or strain while standing. These actions increase the pressure inside your abdomen and force the hernia through the opening in your muscles. Your health care provider may try to reduce the hernia by pressing on it.  Your symptoms and medical history. How is this treated? Surgery is the only treatment for an umbilical hernia. Surgery for a strangulated hernia is done as soon as possible. If you have a small hernia that is not incarcerated, you may need to lose weight before having surgery. Follow these instructions at home:  Lose weight, if told by your health care provider.  Do not try to push the hernia back in.  Watch your hernia for any changes in color or size. Tell your health care provider if any changes occur.  You may need to avoid activities that increase pressure on your hernia.  Do not lift anything that is heavier than 10 lb (4.5 kg) until your health care provider says that this is safe.  Take over-the-counter and prescription medicines only as told by your health care provider.  Keep all follow-up visits as told by your health care provider. This is important. Contact a health care provider if:  Your hernia gets larger.  Your hernia becomes painful. Get help right away if:  You develop sudden, severe pain near the area of your hernia.  You have pain as well as nausea or vomiting.  You have pain and the skin over your hernia changes color.  You develop a fever. This information  is not intended to replace advice given to you by your health care provider. Make sure you discuss any questions you have with your health care provider. Document Revised: 10/18/2017 Document Reviewed: 03/06/2017 Elsevier Patient Education  2021 [place] Maintenance, Female Adopting a healthy lifestyle and getting preventive care are important in promoting health and wellness. Ask your health care provider about:  The right schedule for you to have regular tests and exams.  Things you can do on your own to prevent diseases and keep yourself healthy. What should I know about diet, weight, and exercise? Eat a healthy diet  Eat a diet that includes plenty of vegetables, fruits, low-fat dairy products, and lean protein.  Do not eat a lot of foods that are high in solid fats, added sugars, or sodium.   Maintain a healthy weight Body mass index (BMI) is used to identify weight problems. It estimates body fat based on height and weight. Your health care provider can help determine your BMI and help you achieve or maintain a healthy weight. Get regular exercise Get regular exercise. This is one of the most important things you can do for your health. Most adults should:  Exercise for at least 150 minutes each week. The exercise should increase your heart rate and make you sweat (moderate-intensity exercise).  Do strengthening exercises at least twice a week. This is in addition to the moderate-intensity exercise.  Spend less time sitting. Even light physical activity can be beneficial. Watch cholesterol and blood lipids Have your blood tested for lipids and cholesterol at 51 years of age, then have this test every 5 years. Have your cholesterol levels checked more often if:  Your lipid or cholesterol levels are high.  You are older than 51 years of age.  You are at high risk for heart disease. What should I know about cancer screening? Depending on your health history and  family history, you may need to have cancer screening at various ages. This may include screening for:  Breast cancer.  Cervical cancer.  Colorectal cancer.  Skin cancer.  Lung cancer. What should I know about heart disease, diabetes, and high blood pressure? Blood pressure and heart disease  High blood pressure causes heart disease and increases the risk of stroke. This is more likely to develop in people who have high blood pressure readings, are of African descent, or are overweight.  Have your blood pressure checked: ? Every 3-5 years if you are 24-44 years of age. ? Every year if you are 62 years old or older. Diabetes Have regular diabetes screenings. This checks your fasting blood sugar level. Have the screening done:  Once every three years after age 27 if you are at a normal weight and have a low risk for diabetes.  More often and at a younger age if you are overweight or have a high risk for diabetes. What should I know about preventing infection? Hepatitis B If you have a higher risk for hepatitis B, you should be screened for this virus.  Talk with your health care provider to find out if you are at risk for hepatitis B infection. Hepatitis C Testing is recommended for:  Everyone born from [birth year] through 1966.  Anyone with known risk factors for hepatitis C. Sexually transmitted infections (STIs)  Get screened for STIs, including gonorrhea and chlamydia, if: ? You are sexually active and are younger than 51 years of age. ? You are older than 51 years of age and your health care provider tells you that you are at risk for this type of infection. ? Your sexual activity has changed since you were last screened, and you are at increased risk for chlamydia or gonorrhea. Ask your health care provider if you are at risk.  Ask your health care provider about whether you are at high risk for HIV. Your health care provider may recommend a prescription medicine to help prevent  HIV infection. If you choose to take medicine to prevent HIV, you should first get tested for HIV. You should then be tested every 3 months for as long as you are taking the medicine. Pregnancy  If you are about to stop having your period (premenopausal) and you may become pregnant, seek counseling before you get pregnant.  Take 400 to 800 micrograms (mcg) of folic acid every day if you become pregnant.  Ask for birth control (contraception) if you want to prevent pregnancy. Osteoporosis and menopause Osteoporosis is a disease in which the bones lose minerals and strength with aging. This can result in bone fractures. If you are 7 years old or older, or if you are at risk for osteoporosis and fractures, ask your health care provider if you should:  Be screened for bone loss.  Take a calcium or vitamin D supplement to lower your risk of fractures.  Be given hormone replacement therapy (HRT) to treat symptoms of menopause. Follow these instructions at home: Lifestyle  Do not use any products that contain nicotine or tobacco, such as cigarettes, e-cigarettes, and chewing tobacco. If you need help quitting, ask your health care provider.  Do not use street drugs.  Do not share needles.  Ask your health care provider for help if you need support or information about quitting drugs. Alcohol use  Do not drink alcohol if: ? Your health care provider tells you not to drink. ? You are pregnant, may be pregnant, or are planning to become pregnant.  If you drink alcohol: ? Limit how much you use to 0-1 drink a day. ? Limit intake if you are breastfeeding.  Be aware of how much alcohol is in your drink. In the [place] one drink equals one 12 oz bottle of beer (355 mL), one 5 oz glass of wine (148 mL), or one 1 oz glass of hard liquor (44 mL). General instructions  Schedule regular health, dental, and eye exams.  Stay current with your vaccines.  Tell your health care provider if: ? You  often feel depressed. ? You have ever been abused or do not feel safe at home. Summary  Adopting a healthy lifestyle and getting preventive care are important in promoting health and wellness.  Follow your health care provider's instructions about healthy diet, exercising, and getting tested or screened for diseases.  Follow your health care provider's instructions on monitoring your cholesterol and blood pressure. This information is not intended to replace advice given to you by your health care provider. Make sure you discuss any questions you have with your health care provider. Document Revised:  08/29/2018 Document Reviewed: 08/29/2018 Elsevier Patient Education  [place].

## 2020-10-03 NOTE — Assessment & Plan Note (Signed)
Continues dexilant.

## 2020-10-03 NOTE — Assessment & Plan Note (Signed)
Continues miralax and linzess.

## 2020-10-03 NOTE — Assessment & Plan Note (Signed)
Levels stable on replacement.  

## 2020-10-03 NOTE — Assessment & Plan Note (Signed)
Continue actos.  

## 2020-10-03 NOTE — Assessment & Plan Note (Signed)
Preventative protocols reviewed and updated unless pt declined. Discussed healthy diet and lifestyle.  

## 2020-10-03 NOTE — Assessment & Plan Note (Signed)
Encouraged healthy diet and lifestyle choices to affect sustainable weight loss.  ?

## 2020-10-03 NOTE — Assessment & Plan Note (Addendum)
Enlarged over the past 6 months but remaining asymptomatic - will refer to gen surg for eval.

## 2020-10-03 NOTE — Assessment & Plan Note (Signed)
Sees neurology, on CPAP.  DME is [facility].

## 2020-10-03 NOTE — Assessment & Plan Note (Signed)
Chronic, stable on atorvastatin - continue The 10-year ASCVD risk score Mikey Bussing DC Brooke Bonito., et al., 2013) is: 1.6%   Values used to calculate the score:     Age: 50 years     Sex: Female     Is Non-Hispanic African American: No     Diabetic: Yes     Tobacco smoker: No     Systolic Blood Pressure: 465 mmHg     Is BP treated: No     HDL Cholesterol: 50 mg/dL     Total Cholesterol: 135 mg/dL

## 2020-10-03 NOTE — Assessment & Plan Note (Signed)
Established with psych (Dr Nicolasa Ducking) - continue lamictal, klonopin, sertraline.

## 2020-10-03 NOTE — Assessment & Plan Note (Signed)
Chronic, stable. Continue current regimen of victoza and actos.  Trouble tolerating metformin.

## 2020-10-06 ENCOUNTER — Encounter (INDEPENDENT_AMBULATORY_CARE_PROVIDER_SITE_OTHER): Payer: Self-pay

## 2020-10-06 ENCOUNTER — Encounter: Payer: Self-pay | Admitting: Family Medicine

## 2020-10-06 ENCOUNTER — Encounter (INDEPENDENT_AMBULATORY_CARE_PROVIDER_SITE_OTHER): Payer: Self-pay | Admitting: Physician Assistant

## 2020-10-06 NOTE — Telephone Encounter (Signed)
See other mychart message.

## 2020-10-07 ENCOUNTER — Telehealth (INDEPENDENT_AMBULATORY_CARE_PROVIDER_SITE_OTHER): Payer: BC Managed Care – PPO | Admitting: Physician Assistant

## 2020-10-08 DIAGNOSIS — F4010 Social phobia, unspecified: Secondary | ICD-10-CM | POA: Diagnosis not present

## 2020-10-08 DIAGNOSIS — F341 Dysthymic disorder: Secondary | ICD-10-CM | POA: Diagnosis not present

## 2020-10-15 DIAGNOSIS — F4010 Social phobia, unspecified: Secondary | ICD-10-CM | POA: Diagnosis not present

## 2020-10-15 DIAGNOSIS — F341 Dysthymic disorder: Secondary | ICD-10-CM | POA: Diagnosis not present

## 2020-10-15 IMAGING — DX DG CHEST 2V
2 series · 2 of 2 positions shown · non-contrast
Comparison: 12/15/2015

CLINICAL DATA: Cough, shortness of breath

EXAM:
CHEST - 2 VIEW

[chest pa]
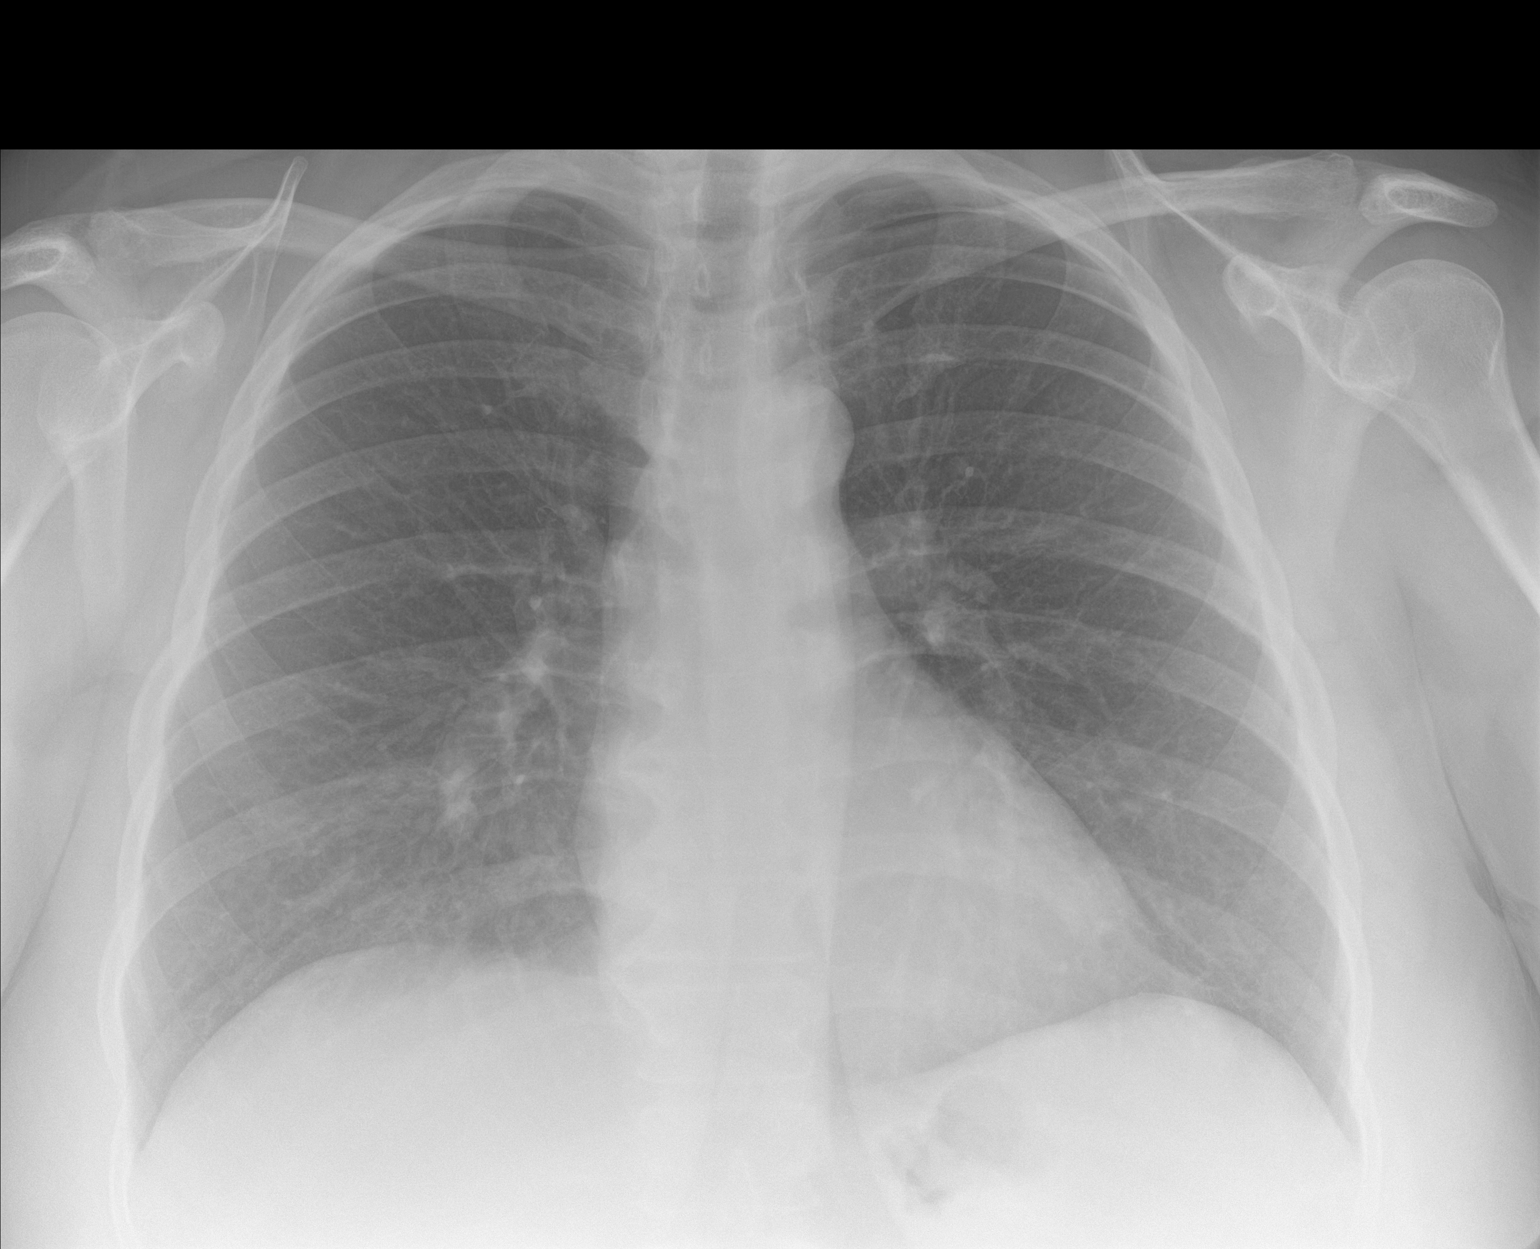

[chest lat]
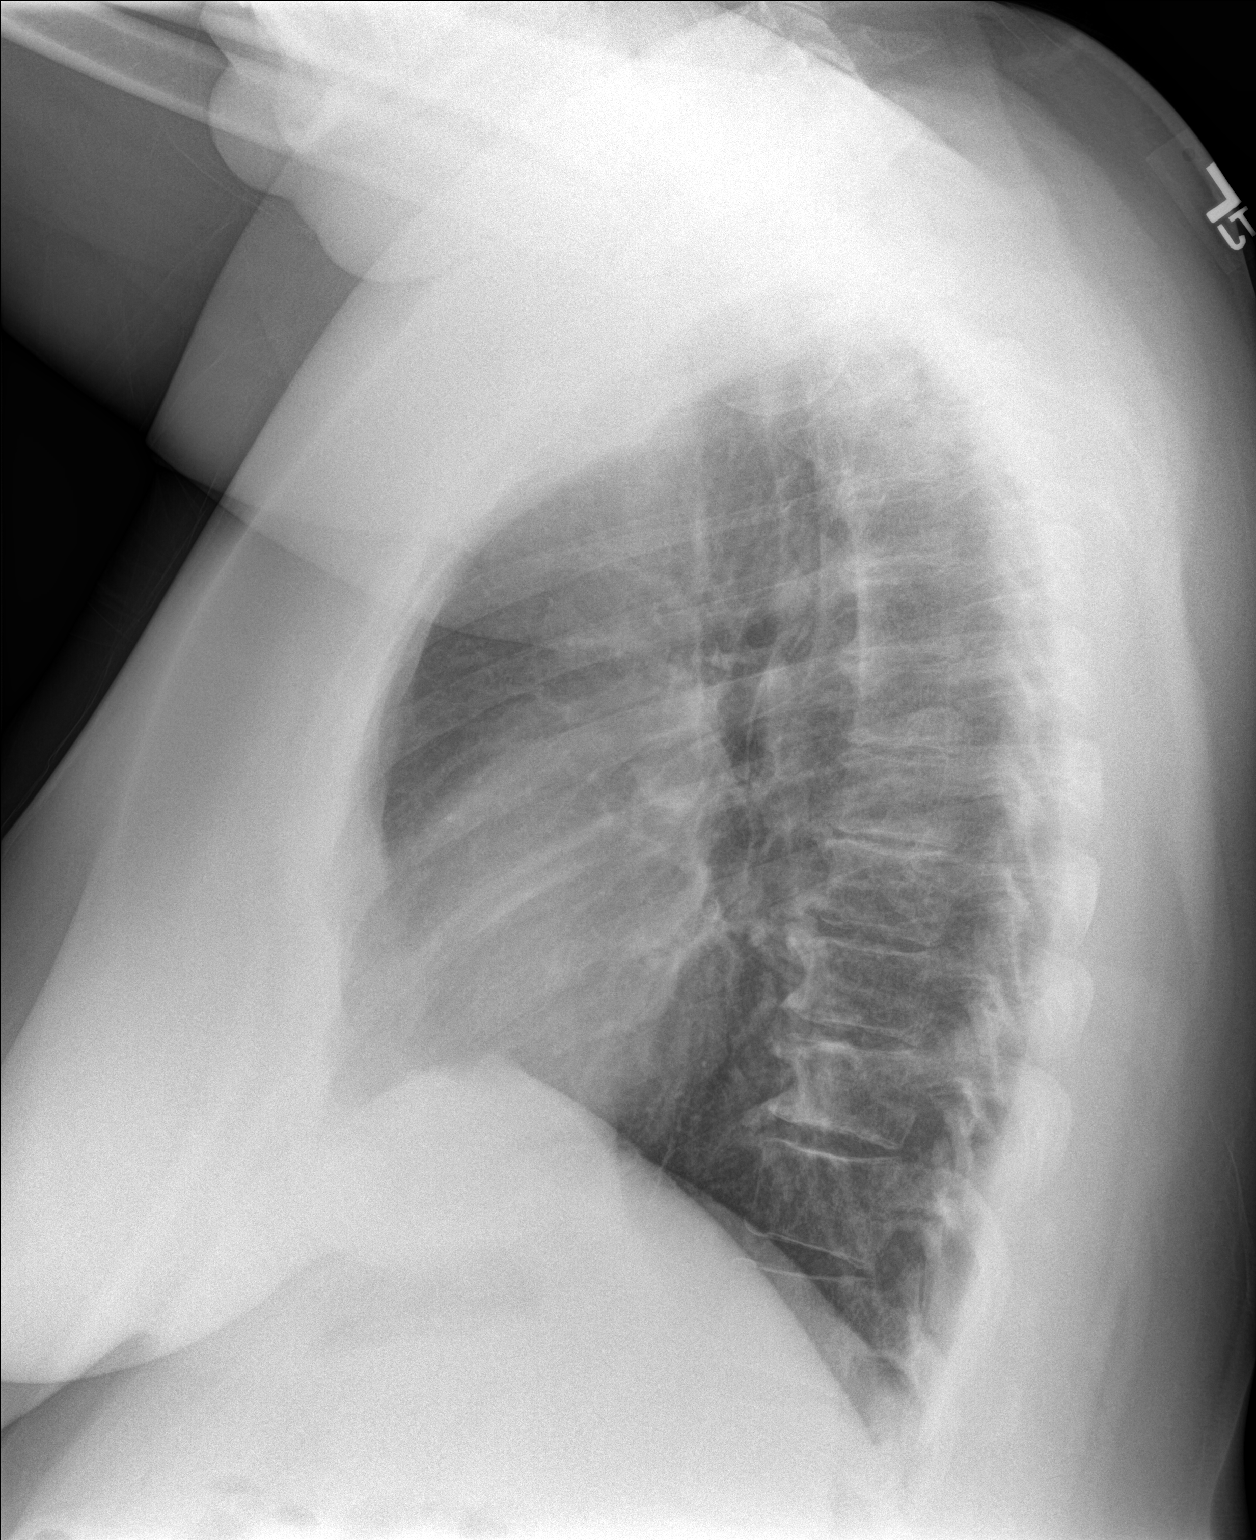

[2 of 2 positions shown; findings below may reference images not displayed]

FINDINGS: The heart size and mediastinal contours are within normal limits.
Both lungs are clear. Disc degenerative disease of the thoracic
spine.
IMPRESSION: No acute abnormality of the lungs.

## 2020-10-19 ENCOUNTER — Other Ambulatory Visit: Payer: Self-pay

## 2020-10-19 ENCOUNTER — Ambulatory Visit (INDEPENDENT_AMBULATORY_CARE_PROVIDER_SITE_OTHER): Payer: BC Managed Care – PPO | Admitting: Bariatrics

## 2020-10-19 ENCOUNTER — Encounter (INDEPENDENT_AMBULATORY_CARE_PROVIDER_SITE_OTHER): Payer: Self-pay | Admitting: Bariatrics

## 2020-10-19 VITALS — BP 121/76 | HR 94 | Temp 98.4°F | Ht 64.0 in | Wt 264.0 lb

## 2020-10-19 DIAGNOSIS — E1169 Type 2 diabetes mellitus with other specified complication: Secondary | ICD-10-CM | POA: Diagnosis not present

## 2020-10-19 DIAGNOSIS — E669 Obesity, unspecified: Secondary | ICD-10-CM

## 2020-10-19 DIAGNOSIS — F5089 Other specified eating disorder: Secondary | ICD-10-CM

## 2020-10-19 DIAGNOSIS — Z6841 Body Mass Index (BMI) 40.0 and over, adult: Secondary | ICD-10-CM

## 2020-10-19 DIAGNOSIS — E7849 Other hyperlipidemia: Secondary | ICD-10-CM | POA: Diagnosis not present

## 2020-10-19 NOTE — Progress Notes (Signed)
Chief Complaint:   OBESITY Lisa Curry is here to discuss her progress with her obesity treatment plan along with follow-up of her obesity related diagnoses. Lisa Curry is on practicing portion control and making smarter food choices, such as increasing vegetables and decreasing simple carbohydrates and states she is following her eating plan approximately 5% of the time. Lisa Curry states she is doing cardio and strength training for 60 minutes 2 times per week.  Today's visit was #: 28 Starting weight: 279 lbs Starting date: 07/26/2018 Today's weight: 264 lbs Today's date: 10/19/2020 Total lbs lost to date: 15 lbs Total lbs lost since last in-office visit: 1 lb  Interim History: Lisa Curry is down 1 pound since her last visit.  She is managing her protein.  She craves more carbohydrates in the evenings.  Subjective:   1. Diabetes mellitus type 2 in obese [facility]) She is taking Victoza and Actos.  Lab Results  Component Value Date   HGBA1C 6.0 (H) 06/16/2020   HGBA1C 6.2 (H) 12/10/2019   HGBA1C 6.2 (H) 08/01/2019   Lab Results  Component Value Date   MICROALBUR <0.7 05/16/2018   LDLCALC 68 06/16/2020   CREATININE 0.74 06/16/2020   Lab Results  Component Value Date   INSULIN 12.0 06/16/2020   INSULIN 9.4 12/10/2019   INSULIN 9.8 11/28/2018   INSULIN 20.0 07/26/2018   2. Other hyperlipidemia Lisa Curry has hyperlipidemia and has been trying to improve her cholesterol levels with intensive lifestyle modification including a low saturated fat diet, exercise and weight loss. She denies any chest pain, claudication or myalgias.  Taking Lipitor.  Lab Results  Component Value Date   ALT 17 06/16/2020   AST 18 06/16/2020   ALKPHOS 109 06/16/2020   BILITOT 0.3 06/16/2020   Lab Results  Component Value Date   CHOL 135 06/16/2020   HDL 50 06/16/2020   LDLCALC 68 06/16/2020   LDLDIRECT 73.0 04/18/2017   TRIG 89 06/16/2020   CHOLHDL 2.7 06/16/2020   3. Other disorder of eating She has tried  Topamax.  Has medications at home.  Assessment/Plan:   1. Diabetes mellitus type 2 in obese (HCC) Good blood sugar control is important to decrease the likelihood of diabetic complications such as nephropathy, neuropathy, limb loss, blindness, coronary artery disease, and death. Intensive lifestyle modification including diet, exercise and weight loss are the first line of treatment for diabetes.  Continue medications.  2. Other hyperlipidemia Cardiovascular risk and specific lipid/LDL goals reviewed.  We discussed several lifestyle modifications today and Lisa Curry will continue to work on diet, exercise and weight loss efforts. Orders and follow up as documented in patient record.  Continue medication.  Counseling Intensive lifestyle modifications are the first line treatment for this issue. . Dietary changes: Increase soluble fiber. Decrease simple carbohydrates. . Exercise changes: Moderate to vigorous-intensity aerobic activity 150 minutes per week if tolerated. . Lipid-lowering medications: see documented in medical record.  3. Other disorder of eating Will call back if medications are needed.  4. Class 3 severe obesity with serious comorbidity and body mass index (BMI) of 45.0 to 49.9 in adult, unspecified obesity type [facility])  Lisa Curry is currently in the action stage of change. As such, her goal is to continue with weight loss efforts. She has agreed to practicing portion control and making smarter food choices, such as increasing vegetables and decreasing simple carbohydrates.   She will work on meal planning, intentional eating, and will work on portion sizes.  Exercise goals: Exercise improving and  gym membership.  Behavioral modification strategies: increasing lean protein intake, decreasing simple carbohydrates, increasing vegetables, increasing water intake, decreasing eating out, no skipping meals, meal planning and cooking strategies, keeping healthy foods in the home and planning  for success.  Lisa Curry has agreed to follow-up with our clinic in 2-3 weeks. She was informed of the importance of frequent follow-up visits to maximize her success with intensive lifestyle modifications for her multiple health conditions.   Objective:   Blood pressure 121/76, pulse 94, temperature 98.4 F (36.9 C), height 5\' 4"  (1.626 m), weight 264 lb (119.7 kg), SpO2 97 %. Body mass index is 45.32 kg/m.  General: Cooperative, alert, well developed, in no acute distress. HEENT: Conjunctivae and lids unremarkable. Cardiovascular: Regular rhythm.  Lungs: Normal work of breathing. Neurologic: No focal deficits.   Lab Results  Component Value Date   CREATININE 0.74 06/16/2020   BUN 9 06/16/2020   NA 140 06/16/2020   K 3.9 06/16/2020   CL 98 06/16/2020   CO2 26 06/16/2020   Lab Results  Component Value Date   ALT 17 06/16/2020   AST 18 06/16/2020   ALKPHOS 109 06/16/2020   BILITOT 0.3 06/16/2020   Lab Results  Component Value Date   HGBA1C 6.0 (H) 06/16/2020   HGBA1C 6.2 (H) 12/10/2019   HGBA1C 6.2 (H) 08/01/2019   HGBA1C 5.9 (H) 04/22/2019   HGBA1C 5.9 (H) 11/28/2018   Lab Results  Component Value Date   INSULIN 12.0 06/16/2020   INSULIN 9.4 12/10/2019   INSULIN 9.8 11/28/2018   INSULIN 20.0 07/26/2018   Lab Results  Component Value Date   TSH 2.330 12/10/2019   Lab Results  Component Value Date   CHOL 135 06/16/2020   HDL 50 06/16/2020   LDLCALC 68 06/16/2020   LDLDIRECT 73.0 04/18/2017   TRIG 89 06/16/2020   CHOLHDL 2.7 06/16/2020   Lab Results  Component Value Date   WBC 9.8 01/27/2020   HGB 13.4 01/27/2020   HCT 40.8 01/27/2020   MCV 91.1 01/27/2020   PLT 279.0 01/27/2020   Lab Results  Component Value Date   IRON 62 05/30/2016   FERRITIN 55.2 05/30/2016   Attestation Statements:   Reviewed by clinician on day of visit: allergies, medications, problem list, medical history, surgical history, family history, social history, and previous  encounter notes.  Time spent on visit including pre-visit chart review and post-visit care and charting was 20 minutes.   I, Water quality scientist, CMA, am acting as Location manager for [employer], DO  I have reviewed the above documentation for accuracy and completeness, and I agree with the above. Jearld Lesch, DO

## 2020-10-20 ENCOUNTER — Ambulatory Visit
Admission: EM | Admit: 2020-10-20 | Discharge: 2020-10-20 | Disposition: A | Discharge status: Home / Self Care | Payer: BC Managed Care – PPO

## 2020-10-20 ENCOUNTER — Telehealth: Payer: Self-pay | Admitting: Family Medicine

## 2020-10-20 ENCOUNTER — Encounter: Payer: Self-pay | Admitting: Family Medicine

## 2020-10-20 DIAGNOSIS — H7490 Unspecified disorder of middle ear and mastoid, unspecified ear: Secondary | ICD-10-CM

## 2020-10-20 DIAGNOSIS — F39 Unspecified mood [affective] disorder: Secondary | ICD-10-CM | POA: Diagnosis not present

## 2020-10-20 DIAGNOSIS — H6982 Other specified disorders of Eustachian tube, left ear: Secondary | ICD-10-CM

## 2020-10-20 DIAGNOSIS — F5105 Insomnia due to other mental disorder: Secondary | ICD-10-CM | POA: Diagnosis not present

## 2020-10-20 DIAGNOSIS — F411 Generalized anxiety disorder: Secondary | ICD-10-CM | POA: Diagnosis not present

## 2020-10-20 NOTE — Telephone Encounter (Signed)
Can you schedule OV with an available provider dizziness?  Dr. Darnell Level has nothing today or tomorrow.

## 2020-10-20 NOTE — Telephone Encounter (Signed)
Pt called back; for 2 wks having dizziness where room spins and pt is just lightheaded or off balance on and off.  With each episode of dizziness usually last approx 1 min. Pt said dizziness worse when flexes neck; lays down, sits up or basically any movement can cause dizziness. Standing does not seem to be as bad with dizziness. Pt has hx of vertigo; pt cannot remember last time had vertigo but said dizziness never this severe. For 2 wks off and on also had H/A; last H/a with pain level of 6 was on 10/19/20. No H/A now. Pt has lt ear ache that has sharpe pain on and off for 1 wk; slight pain now in lt ear. Prod cough with clear phlegm and sinus congestion. Pt has been taking Claritin D but pt cannot tell it is helping any. No SOB, no fever or other covid symptoms. Pt saw psychiatrist earlier today and BP 120 something /86 P 75. Pt did not want to schedule virtual appt with Dr Einar Pheasant today but wanted note sent to Dr Darnell Level to see if he thought she needed to be seen in person due to earache or what to do. Pt request cb after viewed by Dr Aram Beecham asked me if she needed to call pt for appt and I advised no not until Dr Darnell Level sees this note. Sending note to Dr Darnell Level and Lattie Haw CMA.

## 2020-10-20 NOTE — Telephone Encounter (Signed)
I spoke with Dr Darnell Level and he said due to earache pt needs face to face visit and with symptoms unable to bring into office at [facility]. Pt will go to [facility] in [place] since pt is already in [place] at this time. Pt is driving; I advised with dizziness probably should not be driving; pt said she is already out and driving and I advised if gets dizzy while driving pull over and call someone to pick her up or call 911. Pt voiced understanding. Sending note to Dr Darnell Level as Juluis Rainier.

## 2020-10-20 NOTE — Telephone Encounter (Signed)
Called and left vm for her to call us back

## 2020-10-20 NOTE — ED Triage Notes (Signed)
Pt c/o dizziness, frontal forehead sinus pressure, HA, for approx 2 weeks, bilateral ear pain for approx 1 week.  Also reports dysphagia and pain referred to left ear.  Has been taking claritin D, but states no improvement to dizziness.  Last Rx change approx 3 weeks ago.   Denies fever, n/v/d, runny nose, CP, SOB.  Took meclizine yesterday w/o noticeable improvement to dizziness.

## 2020-10-20 NOTE — ED Provider Notes (Signed)
Lisa Curry    CSN: [id number] Arrival date & time: 10/20/20  1117      History   Chief Complaint Chief Complaint  Patient presents with  . Dizziness  . Otalgia    HPI Lisa Curry is a 50 y.o. female who presents with forehead sinus pressure, HA, dizziness, x 2 weeks. Started having bilateral ear pain x 1 week. Feels pain in her L ear when Lisa Curry swallow. Has been taking Claritin D but has not helped. Denies fever, N/V/D, rhinitis, CP or SOB Took Meclizine 25 mg  yesterday once and could not tell it helped. Lisa Curry describes the dizziness as wobbly/off balance and is worse when Lisa Curry rolls over in bed to her side, or sits up from laying or from sitting to laying on her back.      Past Medical History:  Diagnosis Date  . Allergy   . Anemia   . Anxiety   . Barrett esophagus    Don Bulla, have not received EGD report  . Chronic back pain   . Constipation   . Depression with anxiety   . Diabetes mellitus without complication ([place])    DSME 04/2014  . Edema, lower extremity   . Fatty liver   . Gastric polyp 05/2012   h/o EGD  . GERD (gastroesophageal reflux disease)   . History of Helicobacter pylori infection   . HLD (hyperlipidemia)   . Hyperlipidemia   . Vitamin D deficiency     Patient Active Problem List   Diagnosis Date Noted  . Chronic constipation 09/21/2019  . Onychomycosis 09/21/2019  . Other depression, emotional eating 11/07/2018  . OSA (obstructive sleep apnea) 10/23/2018  . Type 2 diabetes mellitus without complication, without long-term current use of insulin ([place]) 07/26/2018  . Fatty liver 07/26/2018  . Anemia 09/01/2016  . Umbilical hernia 0000000  . Lower back pain 08/16/2016  . Intertrigo 06/04/2016  . Vitamin D deficiency   . HLD (hyperlipidemia)   . Encounter for routine adult medical exam with abnormal findings 01/20/2015  . Morbid obesity with BMI of 45.0-49.9, adult ([place]) 11/04/2013  . GERD (gastroesophageal reflux disease) 11/04/2013   . MDD (major depressive disorder), recurrent episode, moderate ([place]) 11/04/2013    Past Surgical History:  Procedure Laterality Date  . CESAREAN SECTION  6/95, 4/98  . DILATION AND CURETTAGE OF UTERUS  2011  . ESOPHAGOGASTRODUODENOSCOPY  2012?   Ferdinand Lango at [facility]  . ESOPHAGOGASTRODUODENOSCOPY  10/2017   [facility])  . LUMBAR DISC SURGERY  03/1995  . NASAL SINUS SURGERY  03/2001    OB History    Gravida  2   Para  2   Term      Preterm      AB      Living        SAB      IAB      Ectopic      Multiple      Live Births               Home Medications    Prior to Admission medications   Medication Sig Start Date End Date Taking? Authorizing Provider  atorvastatin (LIPITOR) 20 MG tablet TAKE 1 TABLET (20 MG TOTAL) BY MOUTH DAILY AT 6 PM. 09/15/20  Yes Ria Bush, MD  Cholecalciferol (VITAMIN D3) 125 MCG (5000 UT) TABS Take 1 tablet by mouth daily.   Yes [provider]  cyclobenzaprine (FLEXERIL) 10 MG tablet TAKE 1 TABLET  BY MOUTH 2 (TWO) TIMES DAILY AS NEEDED FOR MUSCLE SPASMS (SEDATION PRECAUTIONS). 08/12/20  Yes Ria Bush, MD  dexlansoprazole [facility]) 60 MG capsule TAKE 1 CAPSULE BY MOUTH EVERY DAY 08/04/20  Yes Ria Bush, MD  gabapentin (NEURONTIN) 300 MG capsule Take 1 capsule (300 mg total) by mouth 3 (three) times daily. 09/18/19  Yes Ria Bush, MD  lamoTRIgine (LAMICTAL) 100 MG tablet Take 100 mg by mouth at bedtime.   Yes [provider]  lamoTRIgine (LAMICTAL) 200 MG tablet Take 1 tablet (200 mg total) by mouth daily. 01/03/20  Yes Ria Bush, MD  linaclotide [facility]) 145 MCG CAPS capsule Take 1 capsule (145 mcg total) by mouth daily. 09/09/20  Yes Ria Bush, MD  liraglutide (VICTOZA) 18 MG/3ML SOPN Inject 1.8 mg into the skin daily. 07/29/20 10/27/20 Yes Abby Potash, PA-C  meclizine (ANTIVERT) 12.5 MG tablet Take 12.5 mg by mouth 3 (three) times daily as needed for  dizziness.   Yes [provider]  pioglitazone (ACTOS) 30 MG tablet TAKE 1 TABLET BY MOUTH EVERY DAY 09/15/20  Yes Ria Bush, MD  sertraline (ZOLOFT) 50 MG tablet Take 50 mg by mouth daily.   Yes [provider]  clonazePAM (KLONOPIN) 0.5 MG tablet TAKE 1 TABLET BY MOUTH TWICE A DAY AS NEEDED FOR ANXIETY Patient taking differently: Provider suggested stopping this.  Patient did stop and now taking this again every day as of this week 10/18/19 10/20/20 Yes Ria Bush, MD  acetaminophen-codeine (TYLENOL #3) 300-30 MG tablet Take 1 tablet by mouth every 8 (eight) hours as needed for moderate pain. 07/02/19   Ria Bush, MD  glucose blood (ONE TOUCH ULTRA TEST) test strip Use to check sugar twice daily and as needed. Dx: E11.65 12/15/17   Ria Bush, MD  ibuprofen (ADVIL,MOTRIN) 600 MG tablet Take 600 mg by mouth daily.    [provider]  Insulin Pen Needle (PEN NEEDLES) 32G X 6 MM MISC 1 each by Does not apply route daily. 06/16/20   Abby Potash, PA-C  Lancet Devices (ONE TOUCH DELICA LANCING DEV) MISC Use as directed with lancets and one touch meter E11.65 12/15/17   Ria Bush, MD  Multiple Vitamin (MULTIVITAMIN) capsule Take 1 capsule by mouth daily.    [provider]  [facility] DELICA LANCETS 46T MISC Check blood sugar twice daily and as directed. Dx E11.65 12/01/17   Ria Bush, MD  polyethylene glycol powder (GLYCOLAX/MIRALAX) 17 GM/SCOOP powder Take 17 g by mouth daily. 03/27/19   Abby Potash, PA-C  triamcinolone (NASACORT) 55 MCG/ACT AERO nasal inhaler Place 2 sprays into the nose daily.    [provider]    Family History Family History  Problem Relation Age of Onset  . Breast cancer Maternal Grandmother 82  . Dementia Maternal Grandmother   . Heart disease Paternal Grandfather   . Other Sister        Postural Orthostatic Tachycardia Syndrome  . Supraventricular tachycardia Sister   . Other  Maternal Aunt        hysterectomy for irregular bleed with abnormal pap  . Hyperlipidemia Mother   . CAD Father        5 stents placed  . Alzheimer's disease Maternal Grandfather     Social History Social History   Tobacco Use  . Smoking status: Never Smoker  . Smokeless tobacco: Never Used  Vaping Use  . Vaping Use: Never used  Substance Use Topics  . Alcohol use: Yes    Alcohol/week: 0.0 standard  drinks    Comment: occasional  . Drug use: No     Allergies   Povidone iodine, Biaxin [clarithromycin], and Metformin and related   Review of Systems Review of Systems  Constitutional: Negative for chills, diaphoresis and fever.  HENT: Positive for ear pain and sinus pain. Negative for congestion, ear discharge, postnasal drip, rhinorrhea, sore throat and trouble swallowing.   Eyes: Negative for discharge.  Respiratory: Negative for cough, chest tightness and shortness of breath.   Gastrointestinal: Negative for nausea.  Musculoskeletal: Negative for gait problem.  Skin: Negative for rash.  Neurological: Positive for headaches.  Hematological: Negative for adenopathy.     Physical Exam Triage Vital Signs ED Triage Vitals [10/20/20 1133]  Enc Vitals Group     BP (!) 155/82     Pulse Rate 82     Resp 18     Temp 99.3 F (37.4 C)     Temp Source Oral     SpO2 98 %     Weight      Height      Head Circumference      Peak Flow      Pain Score 0     Pain Loc      Pain Edu?      Excl. in [place]?    Orthostatic VS for the past 24 hrs:  BP- Lying Pulse- Lying BP- Sitting Pulse- Sitting BP- Standing at 0 minutes Pulse- Standing at 0 minutes  10/20/20 1145 138/78 85 124/68 80 126/81 84    Updated Vital Signs BP 114/75 (BP Location: Left Arm)   Pulse 82   Temp 99.3 F (37.4 C) (Oral)   Resp 18   LMP 09/17/2020   SpO2 98%   Visual Acuity Right Eye Distance:   Left Eye Distance:   Bilateral Distance:    Right Eye Near:   Left Eye Near:    Bilateral Near:      Physical Exam Vitals and nursing note reviewed.  Constitutional:      General: Lisa Curry is not in acute distress.    Appearance: Lisa Curry is obese. Lisa Curry is not toxic-appearing.  HENT:     Head: Normocephalic.     Right Ear: Tympanic membrane, ear canal and external ear normal.     Left Ear: Tympanic membrane, ear canal and external ear normal.     Nose: Nose normal.     Mouth/Throat:     Mouth: Mucous membranes are moist.  Eyes:     General: No scleral icterus.    Conjunctiva/sclera: Conjunctivae normal.  Neck:     Vascular: No carotid bruit.  Cardiovascular:     Rate and Rhythm: Normal rate and regular rhythm.     Heart sounds: No murmur heard.   Pulmonary:     Effort: Pulmonary effort is normal.     Breath sounds: Normal breath sounds.  Musculoskeletal:        General: Normal range of motion.     Cervical back: Neck supple.  Lymphadenopathy:     Cervical: No cervical adenopathy.  Skin:    General: Skin is warm and dry.     Findings: No rash.  Neurological:     Mental Status: Lisa Curry is alert and oriented to person, place, and time.     Motor: No weakness.     Coordination: Coordination normal.     Gait: Gait normal.     Deep Tendon Reflexes: Reflexes normal.     Comments: Ronberg's  Is neg.  Normal tandem gait. Normal finger to nose  Psychiatric:        Mood and Affect: Mood normal.        Behavior: Behavior normal.        Thought Content: Thought content normal.        Judgment: Judgment normal.    UC Treatments / Results  Labs (all labs ordered are listed, but only abnormal results are displayed) Labs Reviewed - No data to display  EKG   Radiology No results found.  Procedures Procedures (including critical care time)  Medications Ordered in UC Medications - No data to display  Initial Impression / Assessment and Plan / UC Course  I have reviewed the triage vital signs and the nursing notes. Seems to have middle ear and eustachian dysfunction. I advised her  to take Meclezine 25 mg -50 mg tid x 7 days. If not better needs to [facility] with ENT.  Lisa Curry may also d/c Claritin D and use Flonase for sinus pressure and Tylenol or Motrin prn.   Final Clinical Impressions(s) / UC Diagnoses   Final diagnoses:  Disorder of middle ear, unspecified laterality  Eustachian tube dysfunction, left     Discharge Instructions     Take Meclezine 25 mg  to 50 mg three times a day for 7 days Follow up with ear nose and throat doctor if you get worse or dont improve \    ED Prescriptions    None     PDMP not reviewed this encounter.   Shelby Mattocks, PA-C 10/20/20 1230

## 2020-10-20 NOTE — Discharge Instructions (Addendum)
Take Meclezine 25 mg  to 50 mg three times a day for 7 days Follow up with ear nose and throat doctor if you get worse or dont improve \

## 2020-10-20 NOTE — Telephone Encounter (Signed)
Agree with [facility] eval.

## 2020-10-24 DIAGNOSIS — E119 Type 2 diabetes mellitus without complications: Secondary | ICD-10-CM | POA: Diagnosis not present

## 2020-10-24 LAB — HM DIABETES EYE EXAM

## 2020-10-26 ENCOUNTER — Encounter: Payer: Self-pay | Admitting: Family Medicine

## 2020-10-27 DIAGNOSIS — F4010 Social phobia, unspecified: Secondary | ICD-10-CM | POA: Diagnosis not present

## 2020-10-27 DIAGNOSIS — F341 Dysthymic disorder: Secondary | ICD-10-CM | POA: Diagnosis not present

## 2020-10-28 ENCOUNTER — Encounter: Payer: Self-pay | Admitting: Family Medicine

## 2020-10-28 MED ORDER — FLUCONAZOLE 150 MG PO TABS: 150.0000 mg | ORAL_TABLET | Freq: Once | ORAL | 0 refills | Status: AC

## 2020-10-28 NOTE — Telephone Encounter (Signed)
Pt calling office that pt sent a pt message to DR G. Pt wanted to verify that Dr Darnell Level was in office today and that he would see her note today. Pt said she has not been on any abx therapy but has hx of yeast infections that Dr Darnell Level is aware of; last yeast infection with diflucan treatment was 03/2020.Marland Kitchen Pt said for 5 - 7 days pt has a fishy vaginal odor; has burning in perineal area; pt does not have vaginal discharge or itching. Pt does not have a fever and no UTI symptoms. Pt said she has not tried OTC because her menstrual period should start soon and does not want to use a OTC cream. Pt does not want to make appt and wants Dr Darnell Level to see this note and refill diflucan to [facility]. Pt request cb after Dr Darnell Level has reviewed this note and med sent to [facility]. On hx med list diflucan 150 mg # 3 x 0 filled on 03/26/2020. Sending note to DR G and will take note to physician also since pt wants cb today.

## 2020-10-28 NOTE — Telephone Encounter (Signed)
ERx diflucan. rec OV if ongoing symptoms after treatment.

## 2020-11-05 DIAGNOSIS — F341 Dysthymic disorder: Secondary | ICD-10-CM | POA: Diagnosis not present

## 2020-11-05 DIAGNOSIS — F4010 Social phobia, unspecified: Secondary | ICD-10-CM | POA: Diagnosis not present

## 2020-11-17 DIAGNOSIS — F4010 Social phobia, unspecified: Secondary | ICD-10-CM | POA: Diagnosis not present

## 2020-11-17 DIAGNOSIS — F341 Dysthymic disorder: Secondary | ICD-10-CM | POA: Diagnosis not present

## 2020-11-18 ENCOUNTER — Ambulatory Visit: Payer: Self-pay | Admitting: Surgery

## 2020-11-18 DIAGNOSIS — K429 Umbilical hernia without obstruction or gangrene: Secondary | ICD-10-CM | POA: Diagnosis not present

## 2020-11-24 DIAGNOSIS — F341 Dysthymic disorder: Secondary | ICD-10-CM | POA: Diagnosis not present

## 2020-11-24 DIAGNOSIS — F4010 Social phobia, unspecified: Secondary | ICD-10-CM | POA: Diagnosis not present

## 2020-12-02 ENCOUNTER — Ambulatory Visit (INDEPENDENT_AMBULATORY_CARE_PROVIDER_SITE_OTHER): Payer: BC Managed Care – PPO | Admitting: Physician Assistant

## 2020-12-02 ENCOUNTER — Other Ambulatory Visit: Payer: Self-pay

## 2020-12-02 ENCOUNTER — Encounter (INDEPENDENT_AMBULATORY_CARE_PROVIDER_SITE_OTHER): Payer: Self-pay | Admitting: Physician Assistant

## 2020-12-02 VITALS — BP 122/75 | HR 70 | Temp 97.4°F | Ht 64.0 in | Wt 259.0 lb

## 2020-12-02 DIAGNOSIS — E785 Hyperlipidemia, unspecified: Secondary | ICD-10-CM

## 2020-12-02 DIAGNOSIS — E559 Vitamin D deficiency, unspecified: Secondary | ICD-10-CM

## 2020-12-02 DIAGNOSIS — E1169 Type 2 diabetes mellitus with other specified complication: Secondary | ICD-10-CM

## 2020-12-02 DIAGNOSIS — Z9189 Other specified personal risk factors, not elsewhere classified: Secondary | ICD-10-CM

## 2020-12-02 DIAGNOSIS — Z6841 Body Mass Index (BMI) 40.0 and over, adult: Secondary | ICD-10-CM | POA: Diagnosis not present

## 2020-12-02 DIAGNOSIS — E66813 Obesity, class 3: Secondary | ICD-10-CM

## 2020-12-02 MED ORDER — VICTOZA 18 MG/3ML ~~LOC~~ SOPN: 1.8000 mg | PEN_INJECTOR | Freq: Every day | SUBCUTANEOUS | 0 refills | Status: DC

## 2020-12-03 DIAGNOSIS — F341 Dysthymic disorder: Secondary | ICD-10-CM | POA: Diagnosis not present

## 2020-12-03 DIAGNOSIS — F4010 Social phobia, unspecified: Secondary | ICD-10-CM | POA: Diagnosis not present

## 2020-12-07 DIAGNOSIS — F39 Unspecified mood [affective] disorder: Secondary | ICD-10-CM | POA: Diagnosis not present

## 2020-12-07 DIAGNOSIS — F5105 Insomnia due to other mental disorder: Secondary | ICD-10-CM | POA: Diagnosis not present

## 2020-12-07 DIAGNOSIS — F411 Generalized anxiety disorder: Secondary | ICD-10-CM | POA: Diagnosis not present

## 2020-12-07 NOTE — Progress Notes (Signed)
Chief Complaint:   OBESITY Lisa Curry is here to discuss her progress with her obesity treatment plan along with follow-up of her obesity related diagnoses. Lisa Curry is on practicing portion control and making smarter food choices, such as increasing vegetables and decreasing simple carbohydrates and states she is following her eating plan approximately 95% of the time. Lisa Curry states she is doing cardio and weights for 90 minutes 4 times per week.  Today's visit was #: 47 Starting weight: 279 lbs Starting date: 07/26/2018 Today's weight: 259 lbs Today's date: 12/02/2020 Total lbs lost to date: 20 Total lbs lost since last in-office visit: 5  Interim History: Lisa Curry did very well with weight loss. She reports that she started going to the gym. She is following the portion control and smarter choices, but she is also following Category 2 to some extent.  Subjective:   1. Type 2 diabetes mellitus with hyperlipidemia (HCC) Lisa Curry fasting BGs range between 97 and 149, and she is on Victoza.  2. At risk for hypoglycemia Lisa Curry is at increased risk for hypoglycemia due to changes in diet, diagnosis of diabetes, and/or insulin use.   Assessment/Plan:   1. Type 2 diabetes mellitus with hyperlipidemia (HCC) Good blood sugar control is important to decrease the likelihood of diabetic complications such as nephropathy, neuropathy, limb loss, blindness, coronary artery disease, and death. Intensive lifestyle modification including diet, exercise and weight loss are the first line of treatment for diabetes. We will refill Victoza for 1 month. Lisa Curry will continue to follow up as directed.  - liraglutide (VICTOZA) 18 MG/3ML SOPN; Inject 1.8 mg into the skin daily.  Dispense: 27 mL; Refill: 0  2. At risk for hypoglycemia Lisa Curry was given approximately 15 minutes of counseling today regarding prevention of hypoglycemia. She was advised of symptoms of hypoglycemia. Lisa Curry was instructed to avoid skipping meals, eat  regular protein rich meals and schedule low calorie snacks as needed.   Repetitive spaced learning was employed today to elicit superior memory formation and behavioral change  3. Class 3 severe obesity with serious comorbidity and body mass index (BMI) of 45.0 to 49.9 in adult, unspecified obesity type Lisa Curry) Lisa Curry is currently in the action stage of change. As such, her goal is to continue with weight loss efforts. She has agreed to keeping a food journal and adhering to recommended goals of 1300-1500 calories and 95 grams of protein daily.   We will recheck labs at her next visit.  Exercise goals: As is.  Behavioral modification strategies: meal planning and cooking strategies.  Lisa Curry has agreed to follow-up with our clinic in 8 weeks. She was informed of the importance of frequent follow-up visits to maximize her success with intensive lifestyle modifications for her multiple health conditions.   Objective:   Blood pressure 122/75, pulse 70, temperature (!) 97.4 F (36.3 C), height 5\' 4"  (1.626 m), weight 259 lb (117.5 kg), SpO2 98 %. Body mass index is 44.46 kg/m.  General: Cooperative, alert, well developed, in no acute distress. HEENT: Conjunctivae and lids unremarkable. Cardiovascular: Regular rhythm.  Lungs: Normal work of breathing. Neurologic: No focal deficits.   Lab Results  Component Value Date   CREATININE 0.74 06/16/2020   BUN 9 06/16/2020   NA 140 06/16/2020   K 3.9 06/16/2020   CL 98 06/16/2020   CO2 26 06/16/2020   Lab Results  Component Value Date   ALT 17 06/16/2020   AST 18 06/16/2020   ALKPHOS 109 06/16/2020   BILITOT  0.3 06/16/2020   Lab Results  Component Value Date   HGBA1C 6.0 (H) 06/16/2020   HGBA1C 6.2 (H) 12/10/2019   HGBA1C 6.2 (H) 08/01/2019   HGBA1C 5.9 (H) 04/22/2019   HGBA1C 5.9 (H) 11/28/2018   Lab Results  Component Value Date   INSULIN 12.0 06/16/2020   INSULIN 9.4 12/10/2019   INSULIN 9.8 11/28/2018   INSULIN 20.0 07/26/2018    Lab Results  Component Value Date   TSH 2.330 12/10/2019   Lab Results  Component Value Date   CHOL 135 06/16/2020   HDL 50 06/16/2020   LDLCALC 68 06/16/2020   LDLDIRECT 73.0 04/18/2017   TRIG 89 06/16/2020   CHOLHDL 2.7 06/16/2020   Lab Results  Component Value Date   WBC 9.8 01/27/2020   HGB 13.4 01/27/2020   HCT 40.8 01/27/2020   MCV 91.1 01/27/2020   PLT 279.0 01/27/2020   Lab Results  Component Value Date   IRON 62 05/30/2016   FERRITIN 55.2 05/30/2016   Attestation Statements:   Reviewed by clinician on day of visit: allergies, medications, problem list, medical history, surgical history, family history, social history, and previous encounter notes.   Wilhemena Durie, am acting as transcriptionist for [employer], PA-C.  I have reviewed the above documentation for accuracy and completeness, and I agree with the above. Lisa Potash, PA-C

## 2020-12-15 ENCOUNTER — Encounter (INDEPENDENT_AMBULATORY_CARE_PROVIDER_SITE_OTHER): Payer: Self-pay | Admitting: Physician Assistant

## 2020-12-17 DIAGNOSIS — F341 Dysthymic disorder: Secondary | ICD-10-CM | POA: Diagnosis not present

## 2020-12-17 DIAGNOSIS — F4010 Social phobia, unspecified: Secondary | ICD-10-CM | POA: Diagnosis not present

## 2020-12-21 ENCOUNTER — Telehealth: Payer: Self-pay | Admitting: Family Medicine

## 2020-12-22 NOTE — Telephone Encounter (Signed)
My apologies. Thx for catching that and no, pt doesn't need to be scheduled.

## 2020-12-22 NOTE — Telephone Encounter (Signed)
E-scribed refill.  Plz schedule lab and cpe visits for additional refills.

## 2020-12-22 NOTE — Telephone Encounter (Signed)
Patient was here on 09/30/2020 does she still need to schedule? EM

## 2020-12-23 ENCOUNTER — Other Ambulatory Visit: Payer: Self-pay

## 2020-12-23 ENCOUNTER — Ambulatory Visit (AMBULATORY_SURGERY_CENTER): Payer: Self-pay | Admitting: *Deleted

## 2020-12-23 ENCOUNTER — Telehealth: Payer: Self-pay | Admitting: Gastroenterology

## 2020-12-23 VITALS — Ht 64.0 in | Wt 253.0 lb

## 2020-12-23 DIAGNOSIS — Z1211 Encounter for screening for malignant neoplasm of colon: Secondary | ICD-10-CM

## 2020-12-23 MED ORDER — NA SULFATE-K SULFATE-MG SULF 17.5-3.13-1.6 GM/177ML PO SOLN: ORAL | 0 refills | Status: DC

## 2020-12-23 NOTE — Progress Notes (Signed)
Patient is here in-person for PV. Patient denies any allergies to eggs or soy. Patient denies any problems with anesthesia/sedation. Patient denies any oxygen use at home. Patient denies taking any diet/weight loss medications or blood thinners. Patient is not being treated for MRSA or C-diff. Patient is aware of our care-partner policy and [id number] safety protocol. EMMI education assigned to the patient for the procedure, sent to [facility].   Patient is COVID-19 vaccinated x1 J&J, per patient.  Patient request sample of suprep because her husband got a sample about 4 yrs ago for his procedure-I explained that we are no longer getting the suprep samples and I offered the patient Golytely prep that should be covered by her insurance. Pt declined. She understands the cost of suprep and wants to use that prep. Patient also needs a 2 day prep due to constipation. Patient is having hernia sx 2 days after colonoscopy due to size of hernia. I encouraged the patient to call and speak with [facility] about her having the colonoscopy on [facility] the day after her covid test on TUE for the sx. Patient will not be able to quarantine due to coming here for colonoscopy.

## 2020-12-23 NOTE — Telephone Encounter (Signed)
Patient would like a call back to clarify medications sent to pharmacy  [phone]

## 2020-12-23 NOTE — Telephone Encounter (Signed)
Spoke with the patient and explained that the suprep only RX was [facility]

## 2020-12-29 NOTE — Patient Instructions (Addendum)
DUE TO COVID-19 ONLY ONE VISITOR IS ALLOWED TO COME WITH YOU AND STAY IN THE WAITING ROOM ONLY DURING PRE OP AND PROCEDURE DAY OF SURGERY. THE 1 VISITOR  MAY VISIT WITH YOU AFTER SURGERY IN YOUR PRIVATE ROOM DURING VISITING HOURS ONLY!  YOU NEED TO HAVE A COVID 19 TEST ON_4/20______ @_11 :45______, THIS TEST MUST BE DONE BEFORE SURGERY,  COVID TESTING SITE [place] [id number], IT IS ON THE RIGHT GOING OUT [address] APPROXIMATELY  2 MINUTES PAST [employer] ON THE RIGHT. ONCE YOUR COVID TEST IS COMPLETED,  PLEASE BEGIN THE QUARANTINE INSTRUCTIONS AS OUTLINED IN YOUR HANDOUT.                Lisa Curry   Your procedure is scheduled on: 01/08/21   Report to [facility] Main  Entrance   Report to admitting at   10:30 AM     Call this number if you have problems the morning of surgery [phone]    Remember: Do not eat food after Midnight. You may have clear liquids until 9:30 AM  . BRUSH YOUR TEETH MORNING OF SURGERY AND RINSE YOUR MOUTH OUT, NO CHEWING GUM CANDY OR MINTS.     Take these medicines the morning of surgery with A SIP OF WATER: Gabapentin, Lamictal, Zoloft  DO NOT TAKE ANY DIABETIC MEDICATIONS DAY OF YOUR SURGERY How to Manage Your Diabetes Before and After Surgery  Why is it important to control my blood sugar before and after surgery? . Improving blood sugar levels before and after surgery helps healing and can limit problems. . A way of improving blood sugar control is eating a healthy diet by: o  Eating less sugar and carbohydrates o  Increasing activity/exercise o  Talking with your doctor about reaching your blood sugar goals . High blood sugars (greater than 180 mg/dL) can raise your risk of infections and slow your recovery, so you will need to focus on controlling your diabetes during the weeks before surgery. . Make sure that the doctor who takes care of your diabetes knows about your planned surgery including the  date and location.  How do I manage my blood sugar before surgery? . Check your blood sugar at least 4 times a day, starting 2 days before surgery, to make sure that the level is not too high or low. o Check your blood sugar the morning of your surgery when you wake up and every 2 hours until you get to the Short Stay unit. . If your blood sugar is less than 70 mg/dL, you will need to treat for low blood sugar: o Do not take insulin. o Treat a low blood sugar (less than 70 mg/dL) with  cup of clear juice (cranberry or apple), 4 glucose tablets, OR glucose gel. o Recheck blood sugar in 15 minutes after treatment (to make sure it is greater than 70 mg/dL). If your blood sugar is not greater than 70 mg/dL on recheck, call [phone] for further instructions. . Report your blood sugar to the short stay nurse when you get to Short Stay.  . If you are admitted to the hospital after surgery: o Your blood sugar will be checked by the staff and you will probably be given insulin after surgery (instead of oral diabetes medicines) to make sure you have good blood sugar levels. o The goal for blood sugar control after surgery is 80-180 mg/dL.   WHAT DO I DO ABOUT MY DIABETES  MEDICATION?  Marland Kitchen Do not take oral diabetes medicines (pills) the morning of surgery. . Do not take injectables morning of surgery.                                You may not have any metal on your body including hair pins and              piercings  Do not wear jewelry, make-up, lotions, powders or perfumes, deodorant             Do not wear nail polish on your fingernails.  Do not shave  48 hours prior to surgery.     Do not bring valuables to the hospital. [place].  Contacts, dentures or bridgework may not be worn into surgery.      Patients discharged the day of surgery will not be allowed to drive home.   IF YOU ARE HAVING SURGERY AND GOING HOME THE SAME DAY, YOU MUST  HAVE AN ADULT TO DRIVE YOU HOME AND BE WITH YOU FOR 24 HOURS.  YOU MAY GO HOME BY TAXI OR UBER OR ORTHERWISE, BUT AN ADULT MUST ACCOMPANY YOU HOME AND STAY WITH YOU FOR 24 HOURS.  Name and phone number of your driver:  Special Instructions: N/A              Please read over the following fact sheets you were given: _____________________________________________________________________             [facility] - Preparing for Surgery Before surgery, you can play an important role.  Because skin is not sterile, your skin needs to be as free of germs as possible.  You can reduce the number of germs on your skin by washing with CHG (chlorahexidine gluconate) soap before surgery.  CHG is an antiseptic cleaner which kills germs and bonds with the skin to continue killing germs even after washing. Please DO NOT use if you have an allergy to CHG or antibacterial soaps.  If your skin becomes reddened/irritated stop using the CHG and inform your nurse when you arrive at Short Stay. Do not shave (including legs and underarms) for at least 48 hours prior to the first CHG shower.    Please follow these instructions carefully:  1.  Shower with CHG Soap the night before surgery and the  morning of Surgery.  2.  If you choose to wash your hair, wash your hair first as usual with your  normal  shampoo.  3.  After you shampoo, rinse your hair and body thoroughly to remove the  shampoo.                                        4.  Use CHG as you would any other liquid soap.  You can apply chg directly  to the skin and wash                       Gently with a scrungie or clean washcloth.  5.  Apply the CHG Soap to your body ONLY FROM THE NECK DOWN.   Do not use on face/ open  Wound or open sores. Avoid contact with eyes, ears mouth and genitals (private parts).                       Wash face,  Genitals (private parts) with your normal soap.             6.  Wash thoroughly, paying special  attention to the area where your surgery  will be performed.  7.  Thoroughly rinse your body with warm water from the neck down.  8.  DO NOT shower/wash with your normal soap after using and rinsing off  the CHG Soap.             9.  Pat yourself dry with a clean towel.            10.  Wear clean pajamas.            11.  Place clean sheets on your bed the night of your first shower and do not  sleep with pets. Day of Surgery : Do not apply any lotions/deodorants the morning of surgery.  Please wear clean clothes to the hospital/surgery center.  FAILURE TO FOLLOW THESE INSTRUCTIONS MAY RESULT IN THE CANCELLATION OF YOUR SURGERY PATIENT SIGNATURE_________________________________  NURSE SIGNATURE__________________________________  ________________________________________________________________________

## 2020-12-30 ENCOUNTER — Encounter (HOSPITAL_COMMUNITY)
Admission: RE | Admit: 2020-12-30 | Discharge: 2020-12-30 | Disposition: A | Discharge status: Home / Self Care | Payer: BC Managed Care – PPO | Source: Ambulatory Visit | Attending: Surgery | Admitting: Surgery

## 2020-12-30 ENCOUNTER — Telehealth: Payer: Self-pay | Admitting: Gastroenterology

## 2020-12-30 ENCOUNTER — Encounter (HOSPITAL_COMMUNITY): Payer: Self-pay

## 2020-12-30 ENCOUNTER — Other Ambulatory Visit: Payer: Self-pay

## 2020-12-30 DIAGNOSIS — Z01818 Encounter for other preprocedural examination: Secondary | ICD-10-CM | POA: Diagnosis not present

## 2020-12-30 LAB — BASIC METABOLIC PANEL
Anion gap: 8 (ref 5–15)
BUN: 12 mg/dL (ref 6–20)
CO2: 27 mmol/L (ref 22–32)
Calcium: 9.3 mg/dL (ref 8.9–10.3)
Chloride: 107 mmol/L (ref 98–111)
Creatinine, Ser: 0.74 mg/dL (ref 0.44–1.00)
GFR, Estimated: >60 mL/min (ref >60)
Glucose, Bld: 97 mg/dL (ref 70–99)
Potassium: 3.9 mmol/L (ref 3.5–5.1)
Sodium: 142 mmol/L (ref 135–145)

## 2020-12-30 LAB — HEMOGLOBIN A1C
Hgb A1c MFr Bld: 5.5 % (ref 4.8–5.6)
Mean Plasma Glucose: 111.15 mg/dL

## 2020-12-30 LAB — CBC
HCT: 40.4 % (ref 36.0–46.0)
Hemoglobin: 12.8 g/dL (ref 12.0–15.0)
MCH: 30.3 pg (ref 26.0–34.0)
MCHC: 31.7 g/dL (ref 30.0–36.0)
MCV: 95.7 fL (ref 80.0–100.0)
Platelets: 272 10*3/uL (ref 150–400)
RBC: 4.22 MIL/uL (ref 3.87–5.11)
RDW: 14.4 % (ref 11.5–15.5)
WBC: 8.1 10*3/uL (ref 4.0–10.5)
nRBC: 0 % (ref 0.0–0.2)

## 2020-12-30 LAB — GLUCOSE, CAPILLARY: Glucose-Capillary: 112 mg/dL — ABNORMAL HIGH (ref 70–99)

## 2020-12-30 NOTE — Progress Notes (Signed)
COVID Vaccine Completed:yes Date COVID Vaccine completed:12/27/19 COVID vaccine manufacturer:  [employer]   PCP - Dr. Biagio Borg Cardiologist - none  Chest x-ray - no EKG - 12/30/20-chart, epic Stress Test - no ECHO - no Cardiac Cath - no Pacemaker/ICD device last checked:NA  Sleep Study -yes  CPAP - yes  Fasting Blood Sugar - 97-150 Checks Blood Sugar _QD___ times a day  Blood Thinner Instructions:NA Aspirin Instructions: Last Dose:  Anesthesia review:   Patient denies shortness of breath, fever, cough and chest pain at PAT appointment  yes Patient verbalized understanding of instructions that were given to them at the PAT appointment. Patient was also instructed that they will need to review over the PAT instructions again at home before surgery.yes Pthas no SOB with any activities. Her BMI is 43.4.

## 2020-12-30 NOTE — Telephone Encounter (Signed)
Answered patient's questions and encouraged her to follow the prep instructions. I did explain the reason we use the 2 day prep.

## 2020-12-31 DIAGNOSIS — F341 Dysthymic disorder: Secondary | ICD-10-CM | POA: Diagnosis not present

## 2020-12-31 DIAGNOSIS — F4010 Social phobia, unspecified: Secondary | ICD-10-CM | POA: Diagnosis not present

## 2021-01-02 ENCOUNTER — Encounter (HOSPITAL_COMMUNITY): Payer: Self-pay | Admitting: Surgery

## 2021-01-02 NOTE — H&P (Signed)
General Surgery [facility]  Lisa Curry DOB: May 23, 1971 Married / Language: English / Race: White Female   History of Present Illness  The patient is a 50 year old female who presents with an umbilical hernia.  CHIEF COMPLAINT: umbilical hernia  Patient is referred by Dr. Ria Bush for surgical evaluation and management of umbilical hernia. Patient states that the hernia is been present for approximately 5 years. Initially it was quite small but over the past few months it has increased in size considerably. This is likely due to a new exercise routine that she had begun for weight loss. Patient has had no prior hernia repair. Her only abdominal surgery was necessary of dissection. She has had no procedures through the umbilicus. Patient denies any signs or symptoms of obstruction. She is now referred for consideration for repair of umbilical hernia.   Past Surgical History Cesarean Section - Multiple  Spinal Surgery - Lower Back   Diagnostic Studies History Colonoscopy  never Mammogram  within last year Pap Smear  1-5 years ago  Allergies  Iodine *ANTISEPTICS & DISINFECTANTS*  MetFORMIN & Diet Manage Prod *ANTIDIABETICS*  Biaxin *MACROLIDES*  Allergies Reconciled   Medication History  Lipitor (20MG  Tablet, Oral) Active. Actos (15MG  Tablet, Oral) Active. Linzess (145MCG Capsule, Oral) Active. Cycloben & Capsaicin-Menthol (10 & 0.0375-5MG  & % Therapy Pack, Combination) Active. Dexlansoprazole (30MG  Capsule DR, Oral) Active. Insulin Aspart PenFill (100UNIT/ML Soln Cartridge, Subcutaneous) Active. LamoTRIgine (2MG  Tablet Chewable, Oral) Active. Sertraline HCl (20MG /ML Concentrate, Oral) Active. Triamcinolone Acetonide (0.025% Cream, External) Active. LaMICtal (2MG  Tablet Chewable, Oral) Active. Gabapentin (100MG  Capsule, Oral) Active. Codeine Sulfate (15MG  Tablet, Oral) Active. Polyeth Glyc-Propylene Glyc (15-20% Gel,  Nasal) Active. Cholecalciferol (25MCG/0.03ML Liquid, Oral) Active. Meclizine HCl (12.5MG  Tablet, Oral) Active. Medications Reconciled  Social History  Alcohol use  Occasional alcohol use. Caffeine use  Coffee, Tea. No drug use  Tobacco use  Never smoker.  Family History  Anesthetic complications  Family Members In General. Breast Cancer  Family Members In General. Colon Polyps  Father, Mother. Depression  Family Members In General, Mother. Heart Disease  Family Members In General, Father. Heart disease in female family member before age 82  Prostate Cancer  Family Members In General. Thyroid problems  Family Members In General.  Pregnancy / Birth History Age at menarche  89 years. Contraceptive History  Depo-provera, Oral contraceptives. Gravida  2 Irregular periods  Length (months) of breastfeeding  7-12 Maternal age  46-25 Para  2  Other Problems  Anxiety Disorder  Back Pain  Depression  Diabetes Mellitus  Gastroesophageal Reflux Disease  Hypercholesterolemia  Sleep Apnea   Review of Systems  General Not Present- Appetite Loss, Chills, Fatigue, Fever, Night Sweats, Weight Gain and Weight Loss. Skin Not Present- Change in Wart/Mole, Dryness, Hives, Jaundice, New Lesions, Non-Healing Wounds, Rash and Ulcer. HEENT Present- Wears glasses/contact lenses. Not Present- Earache, Hearing Loss, Hoarseness, Nose Bleed, Oral Ulcers, Ringing in the Ears, Seasonal Allergies, Sinus Pain, Sore Throat, Visual Disturbances and Yellow Eyes. Respiratory Not Present- Bloody sputum, Chronic Cough, Difficulty Breathing, Snoring and Wheezing. Breast Not Present- Breast Mass, Breast Pain, Nipple Discharge and Skin Changes. Cardiovascular Not Present- Chest Pain, Difficulty Breathing Lying Down, Leg Cramps, Palpitations, Rapid Heart Rate, Shortness of Breath and Swelling of Extremities. Gastrointestinal Present- Constipation and Indigestion. Not Present- Abdominal  Pain, Bloating, Bloody Stool, Change in Bowel Habits, Chronic diarrhea, Difficulty Swallowing, Excessive gas, Gets full quickly at meals, Hemorrhoids, Nausea, Rectal Pain and Vomiting. Female Genitourinary Not  Present- Frequency, Nocturia, Painful Urination, Pelvic Pain and Urgency. Musculoskeletal Present- Back Pain and Joint Stiffness. Not Present- Joint Pain, Muscle Pain, Muscle Weakness and Swelling of Extremities. Neurological Not Present- Decreased Memory, Fainting, Headaches, Numbness, Seizures, Tingling, Tremor, Trouble walking and Weakness. Psychiatric Present- Anxiety and Depression. Not Present- Bipolar, Change in Sleep Pattern, Fearful and Frequent crying. Endocrine Not Present- Cold Intolerance, Excessive Hunger, Hair Changes, Heat Intolerance, Hot flashes and New Diabetes. Hematology Not Present- Blood Thinners, Easy Bruising, Excessive bleeding, Gland problems, HIV and Persistent Infections.  Vitals  Weight: 267.38 lb Height: 64in Body Surface Area: 2.21 m Body Mass Index: 45.89 kg/m  Temp.: 60F  Pulse: 91 (Regular)  P.OX: 97% (Room air) BP: 120/70(Sitting, Left Arm, Standard)   Physical Exam   GENERAL APPEARANCE Development: normal Nutritional status: normal Gross deformities: none  SKIN Rash, lesions, ulcers: none Induration, erythema: none Nodules: none palpable  EYES Conjunctiva and lids: normal Pupils: equal and reactive Iris: normal bilaterally  EARS, NOSE, MOUTH, THROAT External ears: no lesion or deformity External nose: no lesion or deformity Hearing: grossly normal Due to Covid-19 pandemic, patient is wearing a mask.  NECK Symmetric: yes Trachea: midline Thyroid: no palpable nodules in the thyroid bed  CHEST Respiratory effort: normal Retraction or accessory muscle use: no Breath sounds: normal bilaterally Rales, rhonchi, wheeze: none  CARDIOVASCULAR Auscultation: regular rhythm, normal rate Murmurs: none Pulses: radial pulse  2+ palpable Lower extremity edema: none  ABDOMEN Distension: none Masses: none palpable Tenderness: none Hepatosplenomegaly: not present Hernia: At the umbilicus there is an obvious bulge. On palpation this likely contains omentum or preperitoneal fatty tissue. It is partially reducible. Fascial defect is not palpable. There is no significant tenderness.  MUSCULOSKELETAL Station and gait: normal Digits and nails: no clubbing or cyanosis Muscle strength: grossly normal all extremities Range of motion: grossly normal all extremities Deformity: none  LYMPHATIC Cervical: none palpable Supraclavicular: none palpable  PSYCHIATRIC Oriented to person, place, and time: yes Mood and affect: normal for situation Judgment and insight: appropriate for situation    Assessment & Plan  UMBILICAL HERNIA WITHOUT OBSTRUCTION OR GANGRENE (K42.9)  Patient is referred by her primary care physician for surgical evaluation and management of umbilical hernia. Patient is provided with written literature on hernia repair surgery to review at home.  Patient has a rather large umbilical hernia containing incarcerated omentum or preperitoneal adipose tissue. I do not think that this contains bowel. We discussed open repair of umbilical hernia utilizing a mesh patch. We discussed the risk and benefits of the procedure including the risk of recurrence which is approximately 5%. We discussed restrictions on her activities after the procedure. Patient understands and wishes to proceed with surgery in the near future.  The risks and benefits of the procedure have been discussed at length with the patient. The patient understands the proposed procedure, potential alternative treatments, and the course of recovery to be expected. All of the patient's questions have been answered at this time. The patient wishes to proceed with surgery.  Armandina Gemma, MD [facility] Office:  [phone]

## 2021-01-04 ENCOUNTER — Encounter: Payer: Self-pay | Admitting: Gastroenterology

## 2021-01-05 ENCOUNTER — Other Ambulatory Visit (HOSPITAL_COMMUNITY): Payer: BC Managed Care – PPO

## 2021-01-06 ENCOUNTER — Other Ambulatory Visit: Payer: Self-pay

## 2021-01-06 ENCOUNTER — Ambulatory Visit (AMBULATORY_SURGERY_CENTER): Payer: BC Managed Care – PPO | Admitting: Gastroenterology

## 2021-01-06 ENCOUNTER — Other Ambulatory Visit (HOSPITAL_COMMUNITY)
Admission: RE | Admit: 2021-01-06 | Discharge: 2021-01-06 | Disposition: A | Discharge status: Home / Self Care | Payer: BC Managed Care – PPO | Source: Ambulatory Visit | Attending: Surgery | Admitting: Surgery

## 2021-01-06 ENCOUNTER — Encounter: Payer: Self-pay | Admitting: Gastroenterology

## 2021-01-06 VITALS — BP 138/69 | HR 56 | Temp 97.9°F | Resp 12 | Ht 64.0 in | Wt 253.0 lb

## 2021-01-06 DIAGNOSIS — D125 Benign neoplasm of sigmoid colon: Secondary | ICD-10-CM | POA: Diagnosis not present

## 2021-01-06 DIAGNOSIS — Z1211 Encounter for screening for malignant neoplasm of colon: Secondary | ICD-10-CM

## 2021-01-06 DIAGNOSIS — Z01812 Encounter for preprocedural laboratory examination: Secondary | ICD-10-CM | POA: Insufficient documentation

## 2021-01-06 DIAGNOSIS — Z794 Long term (current) use of insulin: Secondary | ICD-10-CM | POA: Diagnosis not present

## 2021-01-06 DIAGNOSIS — D123 Benign neoplasm of transverse colon: Secondary | ICD-10-CM

## 2021-01-06 DIAGNOSIS — Z888 Allergy status to other drugs, medicaments and biological substances status: Secondary | ICD-10-CM | POA: Diagnosis not present

## 2021-01-06 DIAGNOSIS — Z803 Family history of malignant neoplasm of breast: Secondary | ICD-10-CM | POA: Diagnosis not present

## 2021-01-06 DIAGNOSIS — Z79899 Other long term (current) drug therapy: Secondary | ICD-10-CM | POA: Diagnosis not present

## 2021-01-06 DIAGNOSIS — Z20822 Contact with and (suspected) exposure to covid-19: Secondary | ICD-10-CM | POA: Insufficient documentation

## 2021-01-06 DIAGNOSIS — K219 Gastro-esophageal reflux disease without esophagitis: Secondary | ICD-10-CM | POA: Diagnosis not present

## 2021-01-06 DIAGNOSIS — Z818 Family history of other mental and behavioral disorders: Secondary | ICD-10-CM | POA: Diagnosis not present

## 2021-01-06 DIAGNOSIS — Z8489 Family history of other specified conditions: Secondary | ICD-10-CM | POA: Diagnosis not present

## 2021-01-06 DIAGNOSIS — Z8349 Family history of other endocrine, nutritional and metabolic diseases: Secondary | ICD-10-CM | POA: Diagnosis not present

## 2021-01-06 DIAGNOSIS — Z8371 Family history of colonic polyps: Secondary | ICD-10-CM | POA: Diagnosis not present

## 2021-01-06 DIAGNOSIS — Z7984 Long term (current) use of oral hypoglycemic drugs: Secondary | ICD-10-CM | POA: Diagnosis not present

## 2021-01-06 DIAGNOSIS — Z8249 Family history of ischemic heart disease and other diseases of the circulatory system: Secondary | ICD-10-CM | POA: Diagnosis not present

## 2021-01-06 DIAGNOSIS — E119 Type 2 diabetes mellitus without complications: Secondary | ICD-10-CM | POA: Diagnosis not present

## 2021-01-06 DIAGNOSIS — Z881 Allergy status to other antibiotic agents status: Secondary | ICD-10-CM | POA: Diagnosis not present

## 2021-01-06 DIAGNOSIS — K420 Umbilical hernia with obstruction, without gangrene: Secondary | ICD-10-CM | POA: Diagnosis not present

## 2021-01-06 DIAGNOSIS — Z8042 Family history of malignant neoplasm of prostate: Secondary | ICD-10-CM | POA: Diagnosis not present

## 2021-01-06 HISTORY — PX: COLONOSCOPY: SHX174

## 2021-01-06 LAB — SARS CORONAVIRUS 2 (TAT 6-24 HRS): SARS Coronavirus 2: NEGATIVE

## 2021-01-06 MED ORDER — SODIUM CHLORIDE 0.9 % IV SOLN: 500.0000 mL | Freq: Once | INTRAVENOUS | Status: DC

## 2021-01-06 NOTE — Progress Notes (Signed)
Pt Drowsy. VSS. To PACU, report to RN. No anesthetic complications noted.  

## 2021-01-06 NOTE — Patient Instructions (Signed)
Handout given for polyps.   YOU HAD AN ENDOSCOPIC PROCEDURE TODAY AT [facility]:   Refer to the procedure report that was given to you for any specific questions about what was found during the examination.  If the procedure report does not answer your questions, please call your gastroenterologist to clarify.  If you requested that your care partner not be given the details of your procedure findings, then the procedure report has been included in a sealed envelope for you to review at your convenience later.  YOU SHOULD EXPECT: Some feelings of bloating in the abdomen. Passage of more gas than usual.  Walking can help get rid of the air that was put into your GI tract during the procedure and reduce the bloating. If you had a lower endoscopy (such as a colonoscopy or flexible sigmoidoscopy) you may notice spotting of blood in your stool or on the toilet paper. If you underwent a bowel prep for your procedure, you may not have a normal bowel movement for a few days.  Please Note:  You might notice some irritation and congestion in your nose or some drainage.  This is from the oxygen used during your procedure.  There is no need for concern and it should clear up in a day or so.  SYMPTOMS TO REPORT IMMEDIATELY:   Following lower endoscopy (colonoscopy):  Excessive amounts of blood in the stool  Significant tenderness or worsening of abdominal pains  Swelling of the abdomen that is new, acute  Fever of 100F or higher  For urgent or emergent issues, a gastroenterologist can be reached at any hour by calling [phone]. Do not use MyChart messaging for urgent concerns.    DIET:  We do recommend a small meal at first, but then you may proceed to your regular diet.  Drink plenty of fluids but you should avoid alcoholic beverages for 24 hours.  ACTIVITY:  You should plan to take it easy for the rest of today and you should NOT DRIVE or use heavy machinery until tomorrow  (because of the sedation medicines used during the test).    FOLLOW UP: Our staff will call the number listed on your records 48-72 hours following your procedure to check on you and address any questions or concerns that you may have regarding the information given to you following your procedure. If we do not reach you, we will leave a message.  We will attempt to reach you two times.  During this call, we will ask if you have developed any symptoms of COVID 19. If you develop any symptoms (ie: fever, flu-like symptoms, shortness of breath, cough etc.) before then, please call [phone].  If you test positive for Covid 19 in the 2 weeks post procedure, please call and report this information to [place].    If any biopsies were taken you will be contacted by phone or by letter within the next 1-3 weeks.  Please call us at [phone] if you have not heard about the biopsies in 3 weeks.    SIGNATURES/CONFIDENTIALITY: You and/or your care partner have signed paperwork which will be entered into your electronic medical record.  These signatures attest to the fact that that the information above on your After Visit Summary has been reviewed and is understood.  Full responsibility of the confidentiality of this discharge information lies with you and/or your care-partner.

## 2021-01-06 NOTE — Op Note (Signed)
[facility] Patient Name: Lisa Curry Procedure Date: 01/06/2021 9:13 AM MRN: [id number] Endoscopist: Milus Banister , MD Age: 50 Referring MD:  Date of Birth: 11-10-1970 Gender: Female Account #: [id number] Procedure:                Colonoscopy Indications:              Screening for colorectal malignant neoplasm Medicines:                Monitored Anesthesia Care Procedure:                Pre-Anesthesia Assessment:                           - Prior to the procedure, a History and Physical                            was performed, and patient medications and                            allergies were reviewed. The patient's tolerance of                            previous anesthesia was also reviewed. The risks                            and benefits of the procedure and the sedation                            options and risks were discussed with the patient.                            All questions were answered, and informed consent                            was obtained. Prior Anticoagulants: The patient has                            taken no previous anticoagulant or antiplatelet                            agents. ASA Grade Assessment: III - A patient with                            severe systemic disease. After reviewing the risks                            and benefits, the patient was deemed in                            satisfactory condition to undergo the procedure.                           After obtaining informed consent, the colonoscope  was passed under direct vision. Throughout the                            procedure, the patient's blood pressure, pulse, and                            oxygen saturations were monitored continuously. The                            Olympus CF-[id number]) Colonoscope was                            introduced through the anus and advanced to the the                            cecum, identified  by appendiceal orifice and                            ileocecal valve. The colonoscopy was performed                            without difficulty. The patient tolerated the                            procedure well. The quality of the bowel                            preparation was good. The ileocecal valve,                            appendiceal orifice, and rectum were photographed. Scope In: 9:20:14 AM Scope Out: 9:33:49 AM Scope Withdrawal Time: 0 hours 10 minutes 16 seconds  Total Procedure Duration: 0 hours 13 minutes 35 seconds  Findings:                 Two sessile polyps were found in the sigmoid colon                            and transverse colon. The polyps were 2 to 3 mm in                            size. These polyps were removed with a cold snare.                            Resection and retrieval were complete.                           The exam was otherwise without abnormality on                            direct and retroflexion views. Complications:            No immediate complications. Estimated blood loss:  None. Estimated Blood Loss:     Estimated blood loss: none. Impression:               - Two 2 to 3 mm polyps in the sigmoid colon and in                            the transverse colon, removed with a cold snare.                            Resected and retrieved.                           - The examination was otherwise normal on direct                            and retroflexion views. Recommendation:           - Patient has a contact number available for                            emergencies. The signs and symptoms of potential                            delayed complications were discussed with the                            patient. Return to normal activities tomorrow.                            Written discharge instructions were provided to the                            patient.                           - Resume previous  diet.                           - Continue present medications.                           - Await pathology results. Milus Banister, MD 01/06/2021 9:36:52 AM This report has been signed electronically.

## 2021-01-06 NOTE — Progress Notes (Signed)
Pt's states no medical or surgical changes since previsit or office visit.  Check-in-AER  Vital signs-CW

## 2021-01-06 NOTE — Progress Notes (Signed)
Called to room to assist during endoscopic procedure.  Patient ID and intended procedure confirmed with present staff. Received instructions for my participation in the procedure from the performing physician.  

## 2021-01-08 ENCOUNTER — Ambulatory Visit (HOSPITAL_COMMUNITY): Payer: BC Managed Care – PPO | Admitting: Certified Registered"

## 2021-01-08 ENCOUNTER — Telehealth: Payer: Self-pay

## 2021-01-08 ENCOUNTER — Encounter (HOSPITAL_COMMUNITY): Payer: Self-pay | Admitting: Surgery

## 2021-01-08 ENCOUNTER — Ambulatory Visit (HOSPITAL_COMMUNITY)
Admission: RE | Admit: 2021-01-08 | Discharge: 2021-01-08 | Disposition: A | Discharge status: Home / Self Care | Payer: BC Managed Care – PPO | Attending: Surgery | Admitting: Surgery

## 2021-01-08 ENCOUNTER — Encounter (HOSPITAL_COMMUNITY)
Admission: RE | Disposition: A | Discharge status: Home / Self Care | Payer: Self-pay | Source: Home / Self Care | Attending: Surgery

## 2021-01-08 DIAGNOSIS — E559 Vitamin D deficiency, unspecified: Secondary | ICD-10-CM | POA: Diagnosis not present

## 2021-01-08 DIAGNOSIS — Z8249 Family history of ischemic heart disease and other diseases of the circulatory system: Secondary | ICD-10-CM | POA: Insufficient documentation

## 2021-01-08 DIAGNOSIS — K429 Umbilical hernia without obstruction or gangrene: Secondary | ICD-10-CM

## 2021-01-08 DIAGNOSIS — Z794 Long term (current) use of insulin: Secondary | ICD-10-CM | POA: Diagnosis not present

## 2021-01-08 DIAGNOSIS — Z881 Allergy status to other antibiotic agents status: Secondary | ICD-10-CM | POA: Diagnosis not present

## 2021-01-08 DIAGNOSIS — Z7984 Long term (current) use of oral hypoglycemic drugs: Secondary | ICD-10-CM | POA: Insufficient documentation

## 2021-01-08 DIAGNOSIS — Z8489 Family history of other specified conditions: Secondary | ICD-10-CM | POA: Diagnosis not present

## 2021-01-08 DIAGNOSIS — Z888 Allergy status to other drugs, medicaments and biological substances status: Secondary | ICD-10-CM | POA: Insufficient documentation

## 2021-01-08 DIAGNOSIS — K420 Umbilical hernia with obstruction, without gangrene: Secondary | ICD-10-CM | POA: Diagnosis not present

## 2021-01-08 DIAGNOSIS — Z803 Family history of malignant neoplasm of breast: Secondary | ICD-10-CM | POA: Insufficient documentation

## 2021-01-08 DIAGNOSIS — Z20822 Contact with and (suspected) exposure to covid-19: Secondary | ICD-10-CM | POA: Diagnosis not present

## 2021-01-08 DIAGNOSIS — E119 Type 2 diabetes mellitus without complications: Secondary | ICD-10-CM | POA: Diagnosis not present

## 2021-01-08 DIAGNOSIS — Z818 Family history of other mental and behavioral disorders: Secondary | ICD-10-CM | POA: Insufficient documentation

## 2021-01-08 DIAGNOSIS — G4733 Obstructive sleep apnea (adult) (pediatric): Secondary | ICD-10-CM | POA: Diagnosis not present

## 2021-01-08 DIAGNOSIS — Z8719 Personal history of other diseases of the digestive system: Secondary | ICD-10-CM | POA: Diagnosis present

## 2021-01-08 DIAGNOSIS — K219 Gastro-esophageal reflux disease without esophagitis: Secondary | ICD-10-CM | POA: Diagnosis not present

## 2021-01-08 DIAGNOSIS — Z8349 Family history of other endocrine, nutritional and metabolic diseases: Secondary | ICD-10-CM | POA: Diagnosis not present

## 2021-01-08 DIAGNOSIS — F418 Other specified anxiety disorders: Secondary | ICD-10-CM | POA: Diagnosis not present

## 2021-01-08 DIAGNOSIS — Z8042 Family history of malignant neoplasm of prostate: Secondary | ICD-10-CM | POA: Diagnosis not present

## 2021-01-08 DIAGNOSIS — Z8371 Family history of colonic polyps: Secondary | ICD-10-CM | POA: Insufficient documentation

## 2021-01-08 DIAGNOSIS — Z79899 Other long term (current) drug therapy: Secondary | ICD-10-CM | POA: Diagnosis not present

## 2021-01-08 HISTORY — PX: UMBILICAL HERNIA REPAIR: SHX196

## 2021-01-08 LAB — GLUCOSE, CAPILLARY
Glucose-Capillary: 102 mg/dL — ABNORMAL HIGH (ref 70–99)
Glucose-Capillary: 127 mg/dL — ABNORMAL HIGH (ref 70–99)

## 2021-01-08 LAB — PREGNANCY, URINE: Preg Test, Ur: NEGATIVE

## 2021-01-08 SURGERY — REPAIR, HERNIA, UMBILICAL, ADULT
Anesthesia: General | Site: Abdomen

## 2021-01-08 MED ORDER — LACTATED RINGERS IV SOLN: INTRAVENOUS | Status: DC

## 2021-01-08 MED ORDER — FENTANYL CITRATE (PF) 100 MCG/2ML IJ SOLN
INTRAMUSCULAR | Status: AC
  Filled 2021-01-08: qty 2

## 2021-01-08 MED ORDER — MEPERIDINE HCL 50 MG/ML IJ SOLN: 6.2500 mg | INTRAMUSCULAR | Status: DC | PRN

## 2021-01-08 MED ORDER — AMISULPRIDE (ANTIEMETIC) 5 MG/2ML IV SOLN: 10.0000 mg | Freq: Once | INTRAVENOUS | Status: DC | PRN

## 2021-01-08 MED ORDER — DEXAMETHASONE SODIUM PHOSPHATE 10 MG/ML IJ SOLN
INTRAMUSCULAR | Status: AC
  Filled 2021-01-08: qty 1

## 2021-01-08 MED ORDER — LIDOCAINE 2% (20 MG/ML) 5 ML SYRINGE
INTRAMUSCULAR | Status: AC
  Filled 2021-01-08: qty 5

## 2021-01-08 MED ORDER — TRAMADOL HCL 50 MG PO TABS: 50.0000 mg | ORAL_TABLET | Freq: Four times a day (QID) | ORAL | 0 refills | Status: DC | PRN

## 2021-01-08 MED ORDER — FENTANYL CITRATE (PF) 100 MCG/2ML IJ SOLN
INTRAMUSCULAR | Status: DC | PRN
  Administered 2021-01-08 (×4): 50 ug via INTRAVENOUS

## 2021-01-08 MED ORDER — HYDROMORPHONE HCL 1 MG/ML IJ SOLN
0.2500 mg | INTRAMUSCULAR | Status: DC | PRN
  Administered 2021-01-08 (×4): 0.5 mg via INTRAVENOUS

## 2021-01-08 MED ORDER — HYDROMORPHONE HCL 1 MG/ML IJ SOLN
INTRAMUSCULAR | Status: AC
  Filled 2021-01-08: qty 1

## 2021-01-08 MED ORDER — LIDOCAINE 2% (20 MG/ML) 5 ML SYRINGE
INTRAMUSCULAR | Status: DC | PRN
  Administered 2021-01-08: 60 mg via INTRAVENOUS

## 2021-01-08 MED ORDER — ACETAMINOPHEN 10 MG/ML IV SOLN
1000.0000 mg | Freq: Once | INTRAVENOUS | Status: AC
  Administered 2021-01-08: 1000 mg via INTRAVENOUS

## 2021-01-08 MED ORDER — OXYCODONE HCL 5 MG PO TABS: 5.0000 mg | ORAL_TABLET | Freq: Once | ORAL | Status: DC | PRN

## 2021-01-08 MED ORDER — AMISULPRIDE (ANTIEMETIC) 5 MG/2ML IV SOLN
INTRAVENOUS | Status: AC
  Filled 2021-01-08: qty 4

## 2021-01-08 MED ORDER — ACETAMINOPHEN 10 MG/ML IV SOLN
INTRAVENOUS | Status: AC
  Filled 2021-01-08: qty 100

## 2021-01-08 MED ORDER — DEXAMETHASONE SODIUM PHOSPHATE 10 MG/ML IJ SOLN
INTRAMUSCULAR | Status: DC | PRN
  Administered 2021-01-08: 4 mg via INTRAVENOUS

## 2021-01-08 MED ORDER — CHLORHEXIDINE GLUCONATE CLOTH 2 % EX PADS: 6.0000 | MEDICATED_PAD | Freq: Once | CUTANEOUS | Status: DC

## 2021-01-08 MED ORDER — OXYCODONE HCL 5 MG/5ML PO SOLN: 5.0000 mg | Freq: Once | ORAL | Status: DC | PRN

## 2021-01-08 MED ORDER — PROMETHAZINE HCL 25 MG/ML IJ SOLN
INTRAMUSCULAR | Status: AC
  Filled 2021-01-08: qty 1

## 2021-01-08 MED ORDER — CHLORHEXIDINE GLUCONATE 0.12 % MT SOLN: 15.0000 mL | Freq: Once | OROMUCOSAL | Status: AC

## 2021-01-08 MED ORDER — CEFAZOLIN SODIUM-DEXTROSE 2-4 GM/100ML-% IV SOLN
2.0000 g | INTRAVENOUS | Status: AC
  Administered 2021-01-08: 2 g via INTRAVENOUS
  Filled 2021-01-08: qty 100

## 2021-01-08 MED ORDER — 0.9 % SODIUM CHLORIDE (POUR BTL) OPTIME
TOPICAL | Status: DC | PRN
  Administered 2021-01-08: 1000 mL

## 2021-01-08 MED ORDER — PROPOFOL 10 MG/ML IV BOLUS
INTRAVENOUS | Status: AC
  Filled 2021-01-08: qty 20

## 2021-01-08 MED ORDER — EPHEDRINE SULFATE-NACL 50-0.9 MG/10ML-% IV SOSY
PREFILLED_SYRINGE | INTRAVENOUS | Status: DC | PRN
Start: 2021-01-08 — End: 2021-01-08
  Administered 2021-01-08: 5 mg via INTRAVENOUS

## 2021-01-08 MED ORDER — ONDANSETRON HCL 4 MG/2ML IJ SOLN
INTRAMUSCULAR | Status: AC
  Filled 2021-01-08: qty 2

## 2021-01-08 MED ORDER — BUPIVACAINE HCL 0.25 % IJ SOLN
INTRAMUSCULAR | Status: AC
  Filled 2021-01-08: qty 1

## 2021-01-08 MED ORDER — ORAL CARE MOUTH RINSE
15.0000 mL | Freq: Once | OROMUCOSAL | Status: AC
  Administered 2021-01-08: 15 mL via OROMUCOSAL

## 2021-01-08 MED ORDER — ONDANSETRON HCL 4 MG/2ML IJ SOLN
INTRAMUSCULAR | Status: DC | PRN
  Administered 2021-01-08: 4 mg via INTRAVENOUS

## 2021-01-08 MED ORDER — MIDAZOLAM HCL 2 MG/2ML IJ SOLN
INTRAMUSCULAR | Status: DC | PRN
  Administered 2021-01-08: 2 mg via INTRAVENOUS

## 2021-01-08 MED ORDER — GLYCOPYRROLATE PF 0.2 MG/ML IJ SOSY
PREFILLED_SYRINGE | INTRAMUSCULAR | Status: DC | PRN
  Administered 2021-01-08: .2 mg via INTRAVENOUS

## 2021-01-08 MED ORDER — PROMETHAZINE HCL 25 MG/ML IJ SOLN
6.2500 mg | INTRAMUSCULAR | Status: DC | PRN
  Administered 2021-01-08: 12.5 mg via INTRAVENOUS

## 2021-01-08 MED ORDER — PROPOFOL 10 MG/ML IV BOLUS
INTRAVENOUS | Status: DC | PRN
  Administered 2021-01-08: 200 mg via INTRAVENOUS

## 2021-01-08 MED ORDER — MIDAZOLAM HCL 2 MG/2ML IJ SOLN
INTRAMUSCULAR | Status: AC
  Filled 2021-01-08: qty 2

## 2021-01-08 MED ORDER — BUPIVACAINE HCL 0.25 % IJ SOLN
INTRAMUSCULAR | Status: DC | PRN
  Administered 2021-01-08: 40 mL

## 2021-01-08 SURGICAL SUPPLY — 28 items
ADH SKN CLS APL DERMABOND .7 (GAUZE/BANDAGES/DRESSINGS) ×1
APL PRP STRL LF DISP 70% ISPRP (MISCELLANEOUS) ×2
BLADE HEX COATED 2.75 (ELECTRODE) IMPLANT
CHLORAPREP W/TINT 26 (MISCELLANEOUS) ×4 IMPLANT
COVER SURGICAL LIGHT HANDLE (MISCELLANEOUS) ×2 IMPLANT
COVER WAND RF STERILE (DRAPES) IMPLANT
DECANTER SPIKE VIAL GLASS SM (MISCELLANEOUS) ×2 IMPLANT
DERMABOND ADVANCED (GAUZE/BANDAGES/DRESSINGS) ×1
DERMABOND ADVANCED .7 DNX12 (GAUZE/BANDAGES/DRESSINGS) IMPLANT
DRAPE LAPAROSCOPIC ABDOMINAL (DRAPES) ×2 IMPLANT
ELECT REM PT RETURN 15FT ADLT (MISCELLANEOUS) ×2 IMPLANT
GAUZE SPONGE 4X4 12PLY STRL (GAUZE/BANDAGES/DRESSINGS) IMPLANT
GLOVE SURG ORTHO LTX SZ8 (GLOVE) ×2 IMPLANT
GOWN STRL REUS W/TWL XL LVL3 (GOWN DISPOSABLE) ×4 IMPLANT
KIT BASIN OR (CUSTOM PROCEDURE TRAY) ×2 IMPLANT
KIT TURNOVER KIT A (KITS) ×2 IMPLANT
MARKER SKIN DUAL TIP RULER LAB (MISCELLANEOUS) ×2 IMPLANT
NEEDLE HYPO 22GX1.5 SAFETY (NEEDLE) ×2 IMPLANT
PACK GENERAL/GYN (CUSTOM PROCEDURE TRAY) ×2 IMPLANT
PENCIL SMOKE EVACUATOR (MISCELLANEOUS) IMPLANT
STRIP CLOSURE SKIN 1/2X4 (GAUZE/BANDAGES/DRESSINGS) IMPLANT
SUT MNCRL AB 4-0 PS2 18 (SUTURE) ×2 IMPLANT
SUT NOVA 1 T20/GS 25DT (SUTURE) ×2 IMPLANT
SUT NOVA NAB GS-21 0 18 T12 DT (SUTURE) IMPLANT
SUT VIC AB 3-0 SH 18 (SUTURE) ×3 IMPLANT
SYR 20ML LL LF (SYRINGE) ×2 IMPLANT
TOWEL OR 17X26 10 PK STRL BLUE (TOWEL DISPOSABLE) ×2 IMPLANT
TOWEL OR NON WOVEN STRL DISP B (DISPOSABLE) ×2 IMPLANT

## 2021-01-08 NOTE — Anesthesia Postprocedure Evaluation (Signed)
Anesthesia Post Note  Patient: Lisa Curry  Procedure(s) Performed: OPEN PRIMARY REPAIR OF INCARCERATED UMBILICAL HERNIA (N/A Abdomen)     Patient location during evaluation: PACU Anesthesia Type: General Level of consciousness: awake and alert Pain management: pain level controlled Vital Signs Assessment: post-procedure vital signs reviewed and stable Respiratory status: spontaneous breathing, nonlabored ventilation and respiratory function stable Cardiovascular status: blood pressure returned to baseline and stable Postop Assessment: no apparent nausea or vomiting Anesthetic complications: no   No complications documented.  Last Vitals:  Vitals:   01/08/21 1500 01/08/21 1515  BP: (!) 164/85 (!) 163/89  Pulse: 84 82  Resp: 12 14  Temp:    SpO2: 100% 100%    Last Pain:  Vitals:   01/08/21 1515  TempSrc:   PainSc: Asleep                 Lynda Rainwater

## 2021-01-08 NOTE — Anesthesia Preprocedure Evaluation (Signed)
Anesthesia Evaluation  Patient identified by MRN, date of birth, ID band Patient awake    Reviewed: Allergy & Precautions, NPO status , Patient's Chart, lab work & pertinent test results  Airway Mallampati: II  TM Distance: >3 FB Neck ROM: Full    Dental no notable dental hx.    Pulmonary sleep apnea ,    Pulmonary exam normal breath sounds clear to auscultation       Cardiovascular negative cardio ROS Normal cardiovascular exam Rhythm:Regular Rate:Normal     Neuro/Psych Anxiety Depression negative neurological ROS  negative psych ROS   GI/Hepatic Neg liver ROS, GERD  ,  Endo/Other  diabetesMorbid obesity  Renal/GU negative Renal ROS  negative genitourinary   Musculoskeletal negative musculoskeletal ROS (+)   Abdominal (+) + obese,   Peds negative pediatric ROS (+)  Hematology negative hematology ROS (+)   Anesthesia Other Findings   Reproductive/Obstetrics negative OB ROS                             Anesthesia Physical Anesthesia Plan  ASA: III  Anesthesia Plan: General   Post-op Pain Management:    Induction: Intravenous  PONV Risk Score and Plan: 3 and Ondansetron, Dexamethasone, Midazolam and Treatment may vary due to age or medical condition  Airway Management Planned: Oral ETT  Additional Equipment:   Intra-op Plan:   Post-operative Plan: Extubation in OR  Informed Consent: I have reviewed the patients History and Physical, chart, labs and discussed the procedure including the risks, benefits and alternatives for the proposed anesthesia with the patient or authorized representative who has indicated his/her understanding and acceptance.     Dental advisory given  Plan Discussed with: CRNA  Anesthesia Plan Comments:         Anesthesia Quick Evaluation

## 2021-01-08 NOTE — Op Note (Signed)
Operative Note  Pre-operative Diagnosis:  Incarcerated umbilical hernia  Post-operative Diagnosis:  same  Surgeon:  Armandina Gemma, MD  Assistant:  none   Procedure:  Open primary repair of incarcerated umbilical hernia  Anesthesia:  general  Estimated Blood Loss:  minimal  Drains: none         Specimen: none  Indications:  Patient is referred by Dr. Ria Bush for surgical evaluation and management of umbilical hernia. Patient states that the hernia is been present for approximately 5 years. Initially it was quite small but over the past few months it has increased in size considerably. This is likely due to a new exercise routine that she had begun for weight loss. Patient has had no prior hernia repair. Her only abdominal surgery was necessary of dissection. She has had no procedures through the umbilicus. Patient denies any signs or symptoms of obstruction. She is now referred for consideration for repair of umbilical hernia.  Procedure:  The patient was seen in the pre-op holding area. The risks, benefits, complications, treatment options, and expected outcomes were previously discussed with the patient. The patient agreed with the proposed plan and has signed the informed consent form.  The patient was brought to the operating room by the surgical team, identified as Lisa Curry and the procedure verified. A "time out" was completed and the above information confirmed.  Following induction of general anesthesia, the patient is prepped and draped in the usual aseptic fashion.  After ascertaining that an adequate level of anesthesia been achieved, a transverse incision was made just below the umbilicus.  Dissection was carried through the subcutaneous tissues and a large hernia sac was identified.  The hernia sac was dissected out circumferentially down to the abdominal wall.  There was a fascial defect just at the base of the umbilicus.  Hernia sac was opened and contained  incarcerated omentum.  Fascial defect was extended in the midline inferiorly to allow for reduction of the omentum back into the peritoneal cavity.  Hernia sac was then completely excised.  Fascial edges were cleared circumferentially.  A decision was made to close the hernia primarily.  Fascia appears healthy and viable and there is no undue tension on the suture line.  Omentum underlies the defect.  Fascia was closed with interrupted #1 Novafil simple sutures.  Umbilicus is reaffixed to the abdominal wall with an interrupted 3-0 Vicryl suture.  Subcutaneous tissues are closed in layers with interrupted 3-0 Vicryl sutures.  Fascia is anesthetized with local anesthetic.  Subcutaneous tissues are anesthetized with local anesthetic.  Skin incision is closed with a running 4-0 Monocryl subcuticular suture.  Wound is washed and dried and Dermabond is applied as dressing.  Patient is awakened from anesthesia and transported to the recovery room.  The patient tolerated the procedure well.   Armandina Gemma, MD [facility] Office: [phone]

## 2021-01-08 NOTE — Anesthesia Procedure Notes (Signed)
Procedure Name: LMA Insertion Date/Time: 01/08/2021 1:10 PM Performed by: Niel Hummer, CRNA Pre-anesthesia Checklist: Emergency Drugs available, Patient identified, Suction available and Patient being monitored Patient Re-evaluated:Patient Re-evaluated prior to induction Oxygen Delivery Method: Circle system utilized Preoxygenation: Pre-oxygenation with 100% oxygen Induction Type: IV induction Ventilation: Mask ventilation without difficulty LMA: LMA inserted LMA Size: 4.0 Number of attempts: 1 Dental Injury: Teeth and Oropharynx as per pre-operative assessment

## 2021-01-08 NOTE — Interval H&P Note (Signed)
History and Physical Interval Note:  01/08/2021 12:39 PM  Lisa Curry  has presented today for surgery, with the diagnosis of umbilical hernia.  The various methods of treatment have been discussed with the patient and family. After consideration of risks, benefits and other options for treatment, the patient has consented to    Procedure(s) with comments: Victor (N/A) - 27 MIN as a surgical intervention.    The patient's history has been reviewed, patient examined, no change in status, stable for surgery.  I have reviewed the patient's chart and labs.  Questions were answered to the patient's satisfaction.    Armandina Gemma, MD [facility] Office: [place]

## 2021-01-08 NOTE — Telephone Encounter (Signed)
  Follow up Call-  Call back number 01/06/2021  Post procedure Call Back phone  # [phone]  Permission to leave phone message Yes  Some recent data might be hidden     Patient questions:  Do you have a fever, pain , or abdominal swelling? No. Pain Score  0 *  Have you tolerated food without any problems? Yes.    Have you been able to return to your normal activities? Yes.    Do you have any questions about your discharge instructions: Diet   No. Medications  No. Follow up visit  No.  Do you have questions or concerns about your Care? No.  Actions: * If pain score is 4 or above: No action needed, pain <4.  1. Have you developed a fever since your procedure? no  2.   Have you had an respiratory symptoms (SOB or cough) since your procedure? no  3.   Have you tested positive for COVID 19 since your procedure no  4.   Have you had any family members/close contacts diagnosed with the COVID 19 since your procedure?  no   If yes to any of these questions please route to Joylene John, RN and Joella Prince, RN

## 2021-01-08 NOTE — Transfer of Care (Signed)
Immediate Anesthesia Transfer of Care Note  Patient: Lisa Curry  Procedure(s) Performed: OPEN PRIMARY REPAIR OF INCARCERATED UMBILICAL HERNIA (N/A Abdomen)  Patient Location: PACU  Anesthesia Type:General  Level of Consciousness: awake, alert  and oriented  Airway & Oxygen Therapy: Patient Spontanous Breathing and Patient connected to face mask oxygen  Post-op Assessment: Report given to RN, Post -op Vital signs reviewed and stable and Patient moving all extremities X 4  Post vital signs: Reviewed and stable  Last Vitals:  Vitals Value Taken Time  BP    Temp    Pulse 88 01/08/21 1423  Resp    SpO2 100 % 01/08/21 1423  Vitals shown include unvalidated device data.  Last Pain:  Vitals:   01/08/21 1100  TempSrc: Oral         Complications: No complications documented.

## 2021-01-10 ENCOUNTER — Encounter (HOSPITAL_COMMUNITY): Payer: Self-pay | Admitting: Surgery

## 2021-01-12 ENCOUNTER — Other Ambulatory Visit: Payer: Self-pay | Admitting: Family Medicine

## 2021-01-12 DIAGNOSIS — F341 Dysthymic disorder: Secondary | ICD-10-CM | POA: Diagnosis not present

## 2021-01-12 DIAGNOSIS — F4010 Social phobia, unspecified: Secondary | ICD-10-CM | POA: Diagnosis not present

## 2021-01-18 ENCOUNTER — Encounter: Payer: Self-pay | Admitting: Gastroenterology

## 2021-01-18 ENCOUNTER — Ambulatory Visit: Payer: BC Managed Care – PPO | Admitting: Adult Health

## 2021-01-27 ENCOUNTER — Encounter (INDEPENDENT_AMBULATORY_CARE_PROVIDER_SITE_OTHER): Payer: Self-pay | Admitting: Physician Assistant

## 2021-01-27 ENCOUNTER — Ambulatory Visit (INDEPENDENT_AMBULATORY_CARE_PROVIDER_SITE_OTHER): Payer: BC Managed Care – PPO | Admitting: Physician Assistant

## 2021-01-27 ENCOUNTER — Other Ambulatory Visit: Payer: Self-pay

## 2021-01-27 VITALS — BP 105/66 | HR 65 | Temp 97.9°F | Ht 64.0 in | Wt 244.0 lb

## 2021-01-27 DIAGNOSIS — E1169 Type 2 diabetes mellitus with other specified complication: Secondary | ICD-10-CM

## 2021-01-27 DIAGNOSIS — E785 Hyperlipidemia, unspecified: Secondary | ICD-10-CM | POA: Diagnosis not present

## 2021-01-27 DIAGNOSIS — Z9189 Other specified personal risk factors, not elsewhere classified: Secondary | ICD-10-CM

## 2021-01-27 DIAGNOSIS — Z6841 Body Mass Index (BMI) 40.0 and over, adult: Secondary | ICD-10-CM

## 2021-01-27 MED ORDER — VICTOZA 18 MG/3ML ~~LOC~~ SOPN: 1.8000 mg | PEN_INJECTOR | Freq: Every day | SUBCUTANEOUS | 0 refills | Status: DC

## 2021-01-27 NOTE — Progress Notes (Signed)
Chief Complaint:   OBESITY [name] is here to discuss her progress with her obesity treatment plan along with follow-up of her obesity related diagnoses. Lisa Curry is on keeping a food journal and adhering to recommended goals of 1300-1500 calories and 95 grams of protein daily and states she is following her eating plan approximately 100% of the time. Lisa Curry states she is doing cardio and weights for 45 minutes 3 times per week.  Today's visit was #: 58 Starting weight: 279 lbs Starting date: 07/26/2018 Today's weight: 244 lbs Today's date: 01/27/2021 Total lbs lost to date: 35 Total lbs lost since last in-office visit: 15  Interim History: Lisa Curry is status post hernia repair and colonoscopy. She wants to get back to working out. She is writing down in a journal her calories and protein.  Subjective:   1. Type 2 diabetes mellitus with hyperlipidemia ([place]) Lisa Curry's last A1c was 5.5. She is on Victoza and Actos.  2. Hyperlipidemia, unspecified hyperlipidemia type Lisa Curry is on Lipitor, and she is tolerating it well.  3. At risk for heart disease Lisa Curry is at a higher than average risk for cardiovascular disease due to obesity.   Assessment/Plan:   1. Type 2 diabetes mellitus with hyperlipidemia ([facility]) We will refill Victoza for 90 days with no refills. Lisa Curry will talk with her primary care physician about decreasing Actos. Good blood sugar control is important to decrease the likelihood of diabetic complications such as nephropathy, neuropathy, limb loss, blindness, coronary artery disease, and death. Intensive lifestyle modification including diet, exercise and weight loss are the first line of treatment for diabetes.   - liraglutide (VICTOZA) 18 MG/3ML SOPN; Inject 1.8 mg into the skin daily.  Dispense: 27 mL; Refill: 0  2. Hyperlipidemia, unspecified hyperlipidemia type Cardiovascular risk and specific lipid/LDL goals reviewed. We discussed several lifestyle modifications today and Lisa Curry will  continue with her meal plan, exercise, and weight loss efforts. Orders and follow up as documented in patient record.   3. At risk for heart disease Lisa Curry was given approximately 15 minutes of coronary artery disease prevention counseling today. She is 50 y.o. female and has risk factors for heart disease including obesity. We discussed intensive lifestyle modifications today with an emphasis on specific weight loss instructions and strategies.   Repetitive spaced learning was employed today to elicit superior memory formation and behavioral change.  4. Class 3 severe obesity with serious comorbidity and body mass index (BMI) of 45.0 to 49.9 in adult, unspecified obesity type [facility]) Lisa Curry is currently in the action stage of change. As such, her goal is to continue with weight loss efforts. She has agreed to keeping a food journal and adhering to recommended goals of 1300-1500 calories and 95 grams of protein daily.   Exercise goals: As is.  Behavioral modification strategies: meal planning and cooking strategies and keeping healthy foods in the home.  Lisa Curry has agreed to follow-up with our clinic in 8 weeks. She was informed of the importance of frequent follow-up visits to maximize her success with intensive lifestyle modifications for her multiple health conditions.   Objective:   Blood pressure 105/66, pulse 65, temperature 97.9 F (36.6 C), height 5\' 4"  (1.626 m), weight 244 lb (110.7 kg), SpO2 98 %. Body mass index is 41.88 kg/m.  General: Cooperative, alert, well developed, in no acute distress. HEENT: Conjunctivae and lids unremarkable. Cardiovascular: Regular rhythm.  Lungs: Normal work of breathing. Neurologic: No focal deficits.   Lab Results  Component Value Date  CREATININE 0.74 12/30/2020   BUN 12 12/30/2020   NA 142 12/30/2020   K 3.9 12/30/2020   CL 107 12/30/2020   CO2 27 12/30/2020   Lab Results  Component Value Date   ALT 17 06/16/2020   AST 18 06/16/2020    ALKPHOS 109 06/16/2020   BILITOT 0.3 06/16/2020   Lab Results  Component Value Date   HGBA1C 5.5 12/30/2020   HGBA1C 6.0 (H) 06/16/2020   HGBA1C 6.2 (H) 12/10/2019   HGBA1C 6.2 (H) 08/01/2019   HGBA1C 5.9 (H) 04/22/2019   Lab Results  Component Value Date   INSULIN 12.0 06/16/2020   INSULIN 9.4 12/10/2019   INSULIN 9.8 11/28/2018   INSULIN 20.0 07/26/2018   Lab Results  Component Value Date   TSH 2.330 12/10/2019   Lab Results  Component Value Date   CHOL 135 06/16/2020   HDL 50 06/16/2020   LDLCALC 68 06/16/2020   LDLDIRECT 73.0 04/18/2017   TRIG 89 06/16/2020   CHOLHDL 2.7 06/16/2020   Lab Results  Component Value Date   WBC 8.1 12/30/2020   HGB 12.8 12/30/2020   HCT 40.4 12/30/2020   MCV 95.7 12/30/2020   PLT 272 12/30/2020   Lab Results  Component Value Date   IRON 62 05/30/2016   FERRITIN 55.2 05/30/2016   Attestation Statements:   Reviewed by clinician on day of visit: allergies, medications, problem list, medical history, surgical history, family history, social history, and previous encounter notes.   Lisa Curry, am acting as transcriptionist for [employer], PA-C.  I have reviewed the above documentation for accuracy and completeness, and I agree with the above. Abby Potash, PA-C

## 2021-01-28 DIAGNOSIS — F341 Dysthymic disorder: Secondary | ICD-10-CM | POA: Diagnosis not present

## 2021-01-28 DIAGNOSIS — F4010 Social phobia, unspecified: Secondary | ICD-10-CM | POA: Diagnosis not present

## 2021-02-11 DIAGNOSIS — F4010 Social phobia, unspecified: Secondary | ICD-10-CM | POA: Diagnosis not present

## 2021-02-11 DIAGNOSIS — F341 Dysthymic disorder: Secondary | ICD-10-CM | POA: Diagnosis not present

## 2021-02-25 DIAGNOSIS — F341 Dysthymic disorder: Secondary | ICD-10-CM | POA: Diagnosis not present

## 2021-02-25 DIAGNOSIS — F4010 Social phobia, unspecified: Secondary | ICD-10-CM | POA: Diagnosis not present

## 2021-03-09 ENCOUNTER — Other Ambulatory Visit: Payer: Self-pay | Admitting: Family Medicine

## 2021-03-11 DIAGNOSIS — F4010 Social phobia, unspecified: Secondary | ICD-10-CM | POA: Diagnosis not present

## 2021-03-11 DIAGNOSIS — F341 Dysthymic disorder: Secondary | ICD-10-CM | POA: Diagnosis not present

## 2021-03-23 DIAGNOSIS — F5105 Insomnia due to other mental disorder: Secondary | ICD-10-CM | POA: Diagnosis not present

## 2021-03-23 DIAGNOSIS — F411 Generalized anxiety disorder: Secondary | ICD-10-CM | POA: Diagnosis not present

## 2021-03-23 DIAGNOSIS — F39 Unspecified mood [affective] disorder: Secondary | ICD-10-CM | POA: Diagnosis not present

## 2021-03-24 ENCOUNTER — Ambulatory Visit (INDEPENDENT_AMBULATORY_CARE_PROVIDER_SITE_OTHER): Payer: BC Managed Care – PPO | Admitting: Physician Assistant

## 2021-04-01 ENCOUNTER — Other Ambulatory Visit: Payer: Self-pay | Admitting: Family Medicine

## 2021-04-01 DIAGNOSIS — F4010 Social phobia, unspecified: Secondary | ICD-10-CM | POA: Diagnosis not present

## 2021-04-01 DIAGNOSIS — F341 Dysthymic disorder: Secondary | ICD-10-CM | POA: Diagnosis not present

## 2021-04-06 ENCOUNTER — Ambulatory Visit (INDEPENDENT_AMBULATORY_CARE_PROVIDER_SITE_OTHER): Payer: BC Managed Care – PPO | Admitting: Physician Assistant

## 2021-04-06 ENCOUNTER — Encounter (INDEPENDENT_AMBULATORY_CARE_PROVIDER_SITE_OTHER): Payer: Self-pay | Admitting: Physician Assistant

## 2021-04-06 ENCOUNTER — Other Ambulatory Visit: Payer: Self-pay

## 2021-04-06 VITALS — BP 116/72 | HR 69 | Temp 97.6°F | Ht 64.0 in | Wt 236.0 lb

## 2021-04-06 DIAGNOSIS — E1169 Type 2 diabetes mellitus with other specified complication: Secondary | ICD-10-CM

## 2021-04-06 DIAGNOSIS — Z9189 Other specified personal risk factors, not elsewhere classified: Secondary | ICD-10-CM | POA: Diagnosis not present

## 2021-04-06 DIAGNOSIS — E785 Hyperlipidemia, unspecified: Secondary | ICD-10-CM

## 2021-04-06 DIAGNOSIS — E7849 Other hyperlipidemia: Secondary | ICD-10-CM | POA: Diagnosis not present

## 2021-04-06 DIAGNOSIS — Z6841 Body Mass Index (BMI) 40.0 and over, adult: Secondary | ICD-10-CM

## 2021-04-06 MED ORDER — VICTOZA 18 MG/3ML ~~LOC~~ SOPN: 1.8000 mg | PEN_INJECTOR | Freq: Every day | SUBCUTANEOUS | 0 refills | Status: DC

## 2021-04-13 NOTE — Progress Notes (Signed)
Chief Complaint:   OBESITY Lisa Curry is here to discuss her progress with her obesity treatment plan along with follow-up of her obesity related diagnoses. Lisa Curry is on keeping a food journal and adhering to recommended goals of 1300-1500 calories and 95 grams of protein daily and states she is following her eating plan approximately 0% of the time. Lisa Curry states she is doing 0 minutes 0 times per week.  Today's visit was #: 47 Starting weight: 279 lbs Starting date: 07/26/2018 Today's weight: 236 lbs Today's date: 04/06/2021 Total lbs lost to date: 43 Total lbs lost since last in-office visit: 8  Interim History: Lisa Curry hasn't had an office visit for 2 months. She did well with weight loss despite not following the plan. She is with her daughter daily and so she has not been journaling or working out. She eats out for breakfast and lunch often.  Subjective:   1. Type 2 diabetes mellitus with hyperlipidemia ([place]) Lisa Curry is on Victoza, and her last A1c ws 5.5. Her fasting BGs range between 96-135. She denies hypoglycemia.  2. Other hyperlipidemia Lisa Curry is on Lipitor, and she denies chest pain or myalgias.   3. At risk for hypoglycemia Lisa Curry is at increased risk for hypoglycemia due to changes in diet, diagnosis of diabetes, and/or insulin use.   Assessment/Plan:   1. Type 2 diabetes mellitus with hyperlipidemia [facility]) Lisa Curry will continue her medications and we will refill Victoza for 1 month. Good blood sugar control is important to decrease the likelihood of diabetic complications such as nephropathy, neuropathy, limb loss, blindness, coronary artery disease, and death. Intensive lifestyle modification including diet, exercise and weight loss are the first line of treatment for diabetes.   - liraglutide (VICTOZA) 18 MG/3ML SOPN; Inject 1.8 mg into the skin daily.  Dispense: 27 mL; Refill: 0  2. Other hyperlipidemia Cardiovascular risk and specific lipid/LDL goals reviewed. We discussed several  lifestyle modifications today. We will recheck labs at her next visit. Lisa Curry will continue her meal plan and medications. Orders and follow up as documented in patient record.   Counseling Intensive lifestyle modifications are the first line treatment for this issue. Dietary changes: Increase soluble fiber. Decrease simple carbohydrates. Exercise changes: Moderate to vigorous-intensity aerobic activity 150 minutes per week if tolerated. Lipid-lowering medications: see documented in medical record.  3. At risk for hypoglycemia Lisa Curry was given approximately 15 minutes of counseling today regarding prevention of hypoglycemia. She was advised of symptoms of hypoglycemia. Lisa Curry was instructed to avoid skipping meals, eat regular protein rich meals and schedule low calorie snacks as needed.   Repetitive spaced learning was employed today to elicit superior memory formation and behavioral change  4. Class 3 severe obesity with serious comorbidity and body mass index (BMI) of 45.0 to 49.9 in adult, unspecified obesity type (Lisa Curry), current bmi 40.49 Lisa Curry is currently in the action stage of change. As such, her goal is to continue with weight loss efforts. She has agreed to keeping a food journal and adhering to recommended goals of 1300-1500 calories and 95 grams of protein daily.   Exercise goals: No exercise has been prescribed at this time.  Behavioral modification strategies: meal planning and cooking strategies and keeping healthy foods in the home.  Lisa Curry has agreed to follow-up with our clinic in 8 weeks. She was informed of the importance of frequent follow-up visits to maximize her success with intensive lifestyle modifications for her multiple health conditions.    Objective:   Blood pressure  116/72, pulse 69, temperature 97.6 F (36.4 C), height '5\' 4"'$  (1.626 m), weight 236 lb (107 kg), SpO2 98 %. Body mass index is 40.51 kg/m.  General: Cooperative, alert, well developed, in no acute  distress. HEENT: Conjunctivae and lids unremarkable. Cardiovascular: Regular rhythm.  Lungs: Normal work of breathing. Neurologic: No focal deficits.   Lab Results  Component Value Date   CREATININE 0.74 12/30/2020   BUN 12 12/30/2020   NA 142 12/30/2020   K 3.9 12/30/2020   CL 107 12/30/2020   CO2 27 12/30/2020   Lab Results  Component Value Date   ALT 17 06/16/2020   AST 18 06/16/2020   ALKPHOS 109 06/16/2020   BILITOT 0.3 06/16/2020   Lab Results  Component Value Date   HGBA1C 5.5 12/30/2020   HGBA1C 6.0 (H) 06/16/2020   HGBA1C 6.2 (H) 12/10/2019   HGBA1C 6.2 (H) 08/01/2019   HGBA1C 5.9 (H) 04/22/2019   Lab Results  Component Value Date   INSULIN 12.0 06/16/2020   INSULIN 9.4 12/10/2019   INSULIN 9.8 11/28/2018   INSULIN 20.0 07/26/2018   Lab Results  Component Value Date   TSH 2.330 12/10/2019   Lab Results  Component Value Date   CHOL 135 06/16/2020   HDL 50 06/16/2020   LDLCALC 68 06/16/2020   LDLDIRECT 73.0 04/18/2017   TRIG 89 06/16/2020   CHOLHDL 2.7 06/16/2020   Lab Results  Component Value Date   VD25OH 51.4 06/16/2020   VD25OH 62.0 12/10/2019   VD25OH 53.8 08/01/2019   Lab Results  Component Value Date   WBC 8.1 12/30/2020   HGB 12.8 12/30/2020   HCT 40.4 12/30/2020   MCV 95.7 12/30/2020   PLT 272 12/30/2020   Lab Results  Component Value Date   IRON 62 05/30/2016   FERRITIN 55.2 05/30/2016   Attestation Statements:   Reviewed by clinician on day of visit: allergies, medications, problem list, medical history, surgical history, family history, social history, and previous encounter notes.   Wilhemena Durie, am acting as transcriptionist for [employer], PA-C.  I have reviewed the above documentation for accuracy and completeness, and I agree with the above. Abby Potash, PA-C

## 2021-04-15 DIAGNOSIS — F341 Dysthymic disorder: Secondary | ICD-10-CM | POA: Diagnosis not present

## 2021-04-15 DIAGNOSIS — F4010 Social phobia, unspecified: Secondary | ICD-10-CM | POA: Diagnosis not present

## 2021-04-29 DIAGNOSIS — F4010 Social phobia, unspecified: Secondary | ICD-10-CM | POA: Diagnosis not present

## 2021-04-29 DIAGNOSIS — F341 Dysthymic disorder: Secondary | ICD-10-CM | POA: Diagnosis not present

## 2021-04-30 ENCOUNTER — Telehealth: Payer: Self-pay

## 2021-04-30 NOTE — Telephone Encounter (Signed)
Submitted PA via CoverMyMeds for Dexilant 60 mg cap; key:  [id number].  Received following message:  The member recently filled this medication and will be able to return for their next refill according to their plan limits.

## 2021-05-13 DIAGNOSIS — F4010 Social phobia, unspecified: Secondary | ICD-10-CM | POA: Diagnosis not present

## 2021-05-13 DIAGNOSIS — F341 Dysthymic disorder: Secondary | ICD-10-CM | POA: Diagnosis not present

## 2021-05-18 ENCOUNTER — Encounter: Payer: Self-pay | Admitting: Family Medicine

## 2021-06-01 ENCOUNTER — Ambulatory Visit (INDEPENDENT_AMBULATORY_CARE_PROVIDER_SITE_OTHER): Payer: BC Managed Care – PPO | Admitting: Adult Health

## 2021-06-01 ENCOUNTER — Other Ambulatory Visit: Payer: Self-pay

## 2021-06-01 ENCOUNTER — Encounter (INDEPENDENT_AMBULATORY_CARE_PROVIDER_SITE_OTHER): Payer: Self-pay | Admitting: Adult Health

## 2021-06-01 VITALS — BP 116/70 | HR 75 | Temp 97.9°F | Ht 64.0 in | Wt 245.0 lb

## 2021-06-01 DIAGNOSIS — Z9189 Other specified personal risk factors, not elsewhere classified: Secondary | ICD-10-CM

## 2021-06-01 DIAGNOSIS — E559 Vitamin D deficiency, unspecified: Secondary | ICD-10-CM

## 2021-06-01 DIAGNOSIS — K5909 Other constipation: Secondary | ICD-10-CM | POA: Diagnosis not present

## 2021-06-01 DIAGNOSIS — E119 Type 2 diabetes mellitus without complications: Secondary | ICD-10-CM | POA: Diagnosis not present

## 2021-06-01 DIAGNOSIS — E66813 Obesity, class 3: Secondary | ICD-10-CM

## 2021-06-01 DIAGNOSIS — E785 Hyperlipidemia, unspecified: Secondary | ICD-10-CM | POA: Diagnosis not present

## 2021-06-01 DIAGNOSIS — Z6841 Body Mass Index (BMI) 40.0 and over, adult: Secondary | ICD-10-CM

## 2021-06-01 NOTE — Progress Notes (Signed)
Chief Complaint:   OBESITY Lisa Curry is here to discuss her progress with her obesity treatment plan along with follow-up of her obesity related diagnoses. Lisa Curry is on keeping a food journal and adhering to recommended goals of 1300-1500 calories and 95 grams of protein and states she is following her eating plan approximately 0% of the time. Lisa Curry states she is not exercising regularly.  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 She will stay with her daughter during the day when she is sleeping in preparation for her night shift.  Subjective:   1. Type 2 diabetes mellitus without complication, without long-term current use of insulin (HCC) Ambulatory BG:  Fasting 90-110s, postprandial 79-122, symptomatic at 79. She is on Actos 30 mg daily - managed by PCP. Victoza 1.8 mg daily - managed by [place].  Lab Results  Component Value Date   HGBA1C 5.5 12/30/2020   HGBA1C 6.0 (H) 06/16/2020   HGBA1C 6.2 (H) 12/10/2019   Lab Results  Component Value Date   MICROALBUR <0.7 05/16/2018   LDLCALC 68 06/16/2020   CREATININE 0.74 12/30/2020   Lab Results  Component Value Date   INSULIN 12.0 06/16/2020   INSULIN 9.4 12/10/2019   INSULIN 9.8 11/28/2018   INSULIN 20.0 07/26/2018   2. Vitamin D deficiency She is currently taking OTC vitamin D3 5,000 IU each day.   Lab Results  Component Value Date   VD25OH 51.4 06/16/2020   VD25OH 62.0 12/10/2019   VD25OH 53.8 08/01/2019   3. Chronic constipation Lisa Curry stopped Linzess 145 mcg due to difficulty obtaining the prescription. She has continued MiraLAX.   4. Hyperlipidemia, unspecified hyperlipidemia type She is on atorvastatin 20  mg daily - denies myalgias.  Lab Results  Component Value Date   ALT 17 06/16/2020   AST 18 06/16/2020   ALKPHOS 109 06/16/2020   BILITOT 0.3 06/16/2020   Lab Results  Component Value Date   CHOL 135 06/16/2020   HDL 50 06/16/2020   LDLCALC 68 06/16/2020   LDLDIRECT 73.0 04/18/2017   TRIG 89 06/16/2020   CHOLHDL 2.7 06/16/2020   5. At risk for hypoglycemia Lisa Curry is at increased risk for hypoglycemia due to taking Actos and a GLP-1.  Assessment/Plan:   1. Type 2 diabetes mellitus without complication, without long-term current use of insulin ([place]) Check labs today and send MyChart message to patient after labs with antidiabetic regimen plan.  - Hemoglobin A1c - Insulin, random  2. Vitamin D deficiency Check vitamin D level today.  - VITAMIN D 25 Hydroxy (Vit-D Deficiency, Fractures)  3. Chronic constipation Continue MiraLAX, increase water intake, increase fiber intake, and increase daily walking.  4. Hyperlipidemia, unspecified hyperlipidemia type Check labs today.  - Comprehensive metabolic panel - Lipid panel  5. At risk for hypoglycemia Lisa Curry was given approximately 15 minutes of counseling today regarding prevention of hypoglycemia. She was advised of symptoms of hypoglycemia. Lisa Curry was instructed to avoid skipping meals, eat regular protein rich meals and schedule low calorie snacks as needed.   Repetitive spaced learning was employed today to elicit superior memory formation and behavioral change   6. Class 3 severe obesity with serious comorbidity and  body mass index (BMI) of 45.0 to 49.9 in adult, unspecified obesity type ([place]), current bmi 42.2  Lisa Curry is currently in the action stage of change. As such, her goal is to continue with weight loss efforts. She has agreed to keeping a food journal and adhering to recommended goals of 1300-1500 calories and 95 grams of protein.   Exercise goals:  As is.  1) Resume tracking caloric/protein intake daily  2) Access  [employer] on demand 2 x week when staying with daughter  3) Walk outside 74mns/day when staying with daughter  Behavioral modification strategies: increasing lean protein intake, decreasing simple carbohydrates, meal planning and cooking strategies, keeping healthy foods in the home, planning for success, and keeping a strict food journal.  Resume tracking of intake.  BB at daughter's house 2 times per week.  Walk for 10 minutes.  Lisa Curry agreed to follow-up with our clinic in 8 weeks. She was informed of the importance of frequent follow-up visits to maximize her success with intensive lifestyle modifications for her multiple health conditions.   Lisa Curry informed we would discuss her lab results at her next visit unless there is a critical issue that needs to be addressed sooner. Lisa Curry agreed to keep her next visit at the agreed upon time to discuss these results.  Objective:   Blood pressure 116/70, pulse 75, temperature 97.9 F (36.6 C), height '5\' 4"'$  (1.626 m), weight 245 lb (111.1 kg), SpO2 97 %. Body mass index is 42.05 kg/m.  General: Cooperative, alert, well developed, in no acute distress. HEENT: Conjunctivae and lids unremarkable. Cardiovascular: Regular rhythm.  Lungs: Normal work of breathing. Neurologic: No focal deficits.   Lab Results  Component Value Date   CREATININE 0.74 12/30/2020   BUN 12 12/30/2020   NA 142 12/30/2020   K 3.9 12/30/2020   CL 107 12/30/2020   CO2 27 12/30/2020   Lab Results  Component Value Date   ALT 17 06/16/2020   AST 18 06/16/2020   ALKPHOS 109 06/16/2020   BILITOT 0.3 06/16/2020   Lab Results  Component Value Date   HGBA1C 5.5 12/30/2020   HGBA1C 6.0 (H) 06/16/2020   HGBA1C 6.2 (H) 12/10/2019   HGBA1C 6.2 (H) 08/01/2019   HGBA1C 5.9 (H) 04/22/2019   Lab Results  Component Value Date   INSULIN 12.0 06/16/2020   INSULIN 9.4 12/10/2019   INSULIN 9.8 11/28/2018   INSULIN 20.0 07/26/2018   Lab Results  Component Value  Date   TSH 2.330 12/10/2019   Lab Results  Component Value Date   CHOL 135 06/16/2020   HDL 50 06/16/2020   LDLCALC 68 06/16/2020   LDLDIRECT 73.0 04/18/2017   TRIG 89 06/16/2020   CHOLHDL 2.7 06/16/2020   Lab Results  Component Value Date   VD25OH 51.4 06/16/2020   VD25OH 62.0 12/10/2019   VD25OH 53.8 08/01/2019   Lab Results  Component Value Date   WBC 8.1 12/30/2020   HGB 12.8 12/30/2020   HCT 40.4 12/30/2020   MCV 95.7 12/30/2020   PLT 272 12/30/2020   Lab Results  Component Value Date   IRON 62 05/30/2016   FERRITIN 55.2 05/30/2016   Attestation Statements:   Reviewed by clinician on day of visit: allergies, medications, problem list, medical history, surgical history, family history, social history, and previous encounter notes.  I, AWater quality scientist CMA, am acting as tLocation managerfor KMina Marble NP.  I have reviewed the above documentation for accuracy and completeness, and I agree with the  above. -  Jameya Pontiff d. Jowanna Loeffler, NP-C

## 2021-06-02 ENCOUNTER — Encounter: Payer: Self-pay | Admitting: Family Medicine

## 2021-06-02 ENCOUNTER — Telehealth: Payer: Self-pay

## 2021-06-02 ENCOUNTER — Encounter (INDEPENDENT_AMBULATORY_CARE_PROVIDER_SITE_OTHER): Payer: Self-pay | Admitting: Adult Health

## 2021-06-02 ENCOUNTER — Other Ambulatory Visit (INDEPENDENT_AMBULATORY_CARE_PROVIDER_SITE_OTHER): Payer: Self-pay | Admitting: Adult Health

## 2021-06-02 LAB — VITAMIN D 25 HYDROXY (VIT D DEFICIENCY, FRACTURES): Vit D, 25-Hydroxy: 60 ng/mL (ref 30.0–100.0)

## 2021-06-02 LAB — COMPREHENSIVE METABOLIC PANEL
ALT: 14 IU/L (ref 0–32)
AST: 18 IU/L (ref 0–40)
Albumin/Globulin Ratio: 2 (ref 1.2–2.2)
Albumin: 4.3 g/dL (ref 3.8–4.9)
Alkaline Phosphatase: 92 IU/L (ref 44–121)
BUN/Creatinine Ratio: 21 (ref 9–23)
BUN: 15 mg/dL (ref 6–24)
Bilirubin Total: 0.4 mg/dL (ref 0.0–1.2)
CO2: 25 mmol/L (ref 20–29)
Calcium: 8.8 mg/dL (ref 8.7–10.2)
Chloride: 99 mmol/L (ref 96–106)
Creatinine, Ser: 0.71 mg/dL (ref 0.57–1.00)
Globulin, Total: 2.1 g/dL (ref 1.5–4.5)
Glucose: 77 mg/dL (ref 65–99)
Potassium: 4.3 mmol/L (ref 3.5–5.2)
Sodium: 139 mmol/L (ref 134–144)
Total Protein: 6.4 g/dL (ref 6.0–8.5)
eGFR: 103 mL/min/{1.73_m2} (ref >59)

## 2021-06-02 LAB — HEMOGLOBIN A1C
Est. average glucose Bld gHb Est-mCnc: 108 mg/dL
Hgb A1c MFr Bld: 5.4 % (ref 4.8–5.6)

## 2021-06-02 LAB — INSULIN, RANDOM: INSULIN: 5.4 u[IU]/mL (ref 2.6–24.9)

## 2021-06-02 LAB — LIPID PANEL
Chol/HDL Ratio: 2.7 ratio (ref 0.0–4.4)
Cholesterol, Total: 141 mg/dL (ref 100–199)
HDL: 52 mg/dL (ref >39)
LDL Chol Calc (NIH): 73 mg/dL (ref 0–99)
Triglycerides: 82 mg/dL (ref 0–149)
VLDL Cholesterol Cal: 16 mg/dL (ref 5–40)

## 2021-06-02 NOTE — Telephone Encounter (Signed)
Submitted PA for Dexilant 60 mg cap; key:  [id number].  Received following message:  The member recently filled this medication and will be able to return for their next refill according to their plan limits.  Notified pt, via MyChart, of above message.

## 2021-06-03 DIAGNOSIS — F4010 Social phobia, unspecified: Secondary | ICD-10-CM | POA: Diagnosis not present

## 2021-06-03 DIAGNOSIS — F341 Dysthymic disorder: Secondary | ICD-10-CM | POA: Diagnosis not present

## 2021-06-09 ENCOUNTER — Other Ambulatory Visit (INDEPENDENT_AMBULATORY_CARE_PROVIDER_SITE_OTHER): Payer: Self-pay | Admitting: Adult Health

## 2021-06-09 ENCOUNTER — Encounter (INDEPENDENT_AMBULATORY_CARE_PROVIDER_SITE_OTHER): Payer: Self-pay | Admitting: Adult Health

## 2021-06-09 DIAGNOSIS — E1169 Type 2 diabetes mellitus with other specified complication: Secondary | ICD-10-CM

## 2021-06-09 MED ORDER — VICTOZA 18 MG/3ML ~~LOC~~ SOPN
1.8000 mg | PEN_INJECTOR | Freq: Every day | SUBCUTANEOUS | 0 refills | Status: DC
Start: 2021-06-09 — End: 2021-09-01

## 2021-06-09 NOTE — Telephone Encounter (Signed)
Last OV with Katy 

## 2021-06-17 DIAGNOSIS — F4010 Social phobia, unspecified: Secondary | ICD-10-CM | POA: Diagnosis not present

## 2021-06-17 DIAGNOSIS — F341 Dysthymic disorder: Secondary | ICD-10-CM | POA: Diagnosis not present

## 2021-06-19 DIAGNOSIS — M79652 Pain in left thigh: Secondary | ICD-10-CM | POA: Insufficient documentation

## 2021-06-29 ENCOUNTER — Other Ambulatory Visit: Payer: Self-pay | Admitting: Family Medicine

## 2021-07-03 DIAGNOSIS — F5105 Insomnia due to other mental disorder: Secondary | ICD-10-CM | POA: Diagnosis not present

## 2021-07-03 DIAGNOSIS — F411 Generalized anxiety disorder: Secondary | ICD-10-CM | POA: Diagnosis not present

## 2021-07-03 DIAGNOSIS — F39 Unspecified mood [affective] disorder: Secondary | ICD-10-CM | POA: Diagnosis not present

## 2021-07-08 DIAGNOSIS — F341 Dysthymic disorder: Secondary | ICD-10-CM | POA: Diagnosis not present

## 2021-07-08 DIAGNOSIS — F4010 Social phobia, unspecified: Secondary | ICD-10-CM | POA: Diagnosis not present

## 2021-07-14 ENCOUNTER — Encounter: Payer: Self-pay | Admitting: Family Medicine

## 2021-07-15 ENCOUNTER — Telehealth: Payer: Self-pay

## 2021-07-15 NOTE — Telephone Encounter (Signed)
See phn note, 07/15/21.

## 2021-07-15 NOTE — Telephone Encounter (Signed)
Submitted PA for Dexilant 60 mg cap; key:  [id number].  Decision pending.

## 2021-07-16 NOTE — Telephone Encounter (Signed)
Received faxed PA approval, valid 07/15/2021- 07/15/2022.  Made pharmacy aware.

## 2021-07-20 ENCOUNTER — Other Ambulatory Visit: Payer: Self-pay | Admitting: Family Medicine

## 2021-07-22 DIAGNOSIS — F341 Dysthymic disorder: Secondary | ICD-10-CM | POA: Diagnosis not present

## 2021-07-22 DIAGNOSIS — F4010 Social phobia, unspecified: Secondary | ICD-10-CM | POA: Diagnosis not present

## 2021-07-26 ENCOUNTER — Encounter: Payer: Self-pay | Admitting: Family Medicine

## 2021-07-28 ENCOUNTER — Telehealth: Payer: Self-pay | Admitting: Family Medicine

## 2021-07-28 NOTE — Telephone Encounter (Signed)
Pt called in wanting to know status of mychart message for her medication

## 2021-07-29 ENCOUNTER — Telehealth: Payer: Self-pay

## 2021-07-29 MED ORDER — NEXIUM 40 MG PO CPDR: 40.0000 mg | DELAYED_RELEASE_CAPSULE | Freq: Every day | ORAL | 3 refills | Status: DC

## 2021-07-29 NOTE — Telephone Encounter (Signed)
Replied via mychart. We got Dexilant approved through PA last month, but apparently cost didn't drop.  Seeing if she can tolerate /afford brand Nexium.

## 2021-07-29 NOTE — Telephone Encounter (Signed)
Noted  

## 2021-07-29 NOTE — Telephone Encounter (Signed)
Received faxed PA requests form [facility] for brand Nexium 40 mg cap.  Submitted PA; key:  [id number].  Decision pending.

## 2021-07-30 ENCOUNTER — Telehealth: Payer: Self-pay | Admitting: Family Medicine

## 2021-07-30 MED ORDER — MOLNUPIRAVIR EUA 200MG CAPSULE: 4.0000 | ORAL_CAPSULE | Freq: Two times a day (BID) | ORAL | 0 refills | Status: AC

## 2021-07-30 NOTE — Telephone Encounter (Signed)
PA approved.  (See phn note, 07/29/21)

## 2021-07-30 NOTE — Telephone Encounter (Addendum)
Received mailed PA approval, valid 07/29/2021- 07/29/2022.  Made pharmacy aware.

## 2021-07-30 NOTE — Telephone Encounter (Signed)
Received a call from call a nurse Pt tested positive for COVID today 07/30/2021 Current symptoms include cough, ST, HA, and sinus drainage. No fever or dyspnea.  Risk factors include obesity, DM.   Given drug interactions with paxlovid (statin, clonazepam, lamictal, PPI), will Rx molnupiravir.  Update [place] if ongoing or worsening symptoms.

## 2021-08-02 ENCOUNTER — Ambulatory Visit (INDEPENDENT_AMBULATORY_CARE_PROVIDER_SITE_OTHER): Payer: BC Managed Care – PPO | Admitting: Adult Health

## 2021-08-02 MED ORDER — CHERATUSSIN AC 100-10 MG/5ML PO SOLN: 5.0000 mL | Freq: Three times a day (TID) | ORAL | 0 refills | Status: DC | PRN

## 2021-08-02 NOTE — Telephone Encounter (Signed)
Left message on vm per dpr relaying Dr. G's message.  

## 2021-08-02 NOTE — Telephone Encounter (Signed)
Plz notify I've sent in cheratussin for her.

## 2021-08-02 NOTE — Addendum Note (Signed)
Addended by: Ria Bush on: 08/02/2021 08:57 AM   Modules accepted: Orders

## 2021-08-02 NOTE — Telephone Encounter (Signed)
See note from Dr Darnell Level below this access nurse note. Sending note to Dr Darnell Level and Lattie Haw CMA. I spoke with Lisa Curry; Lisa Curry said she is feeling better today except Lisa Curry is coughing a lot; did not sleep well last night due to dry cough. Lisa Curry has head congestion but no SOB,no distress in breathing; no fever and no Vomiting or diarrhea. Lisa Curry is taking ibuprofen 800 mg q 12 hrs, allegra D and molnupiravir. Lisa Curry would like med sent to [facility] for cough; presently Lisa Curry is not taking anything for cough. Lisa Curry request cb after meds sent to pharmacy.

## 2021-08-02 NOTE — Telephone Encounter (Signed)
[facility] Night - Client TELEPHONE ADVICE RECORD AccessNurse Patient Name: Lisa Curry Gender: Female DOB: Oct 06, 1970 Age: 50 Y 3 M 15 D Return Phone Number: [phone] (Primary) Address[place]  [address] Client [facility] Night - Client Client Site [place] Physician Ria Bush - MD Contact Type Call Who Is Calling Patient / Member / Family / Caregiver Call Type Triage / Clinical Relationship To Patient Self Return Phone Number [phone] (Primary) Chief Complaint Sore Throat Reason for Call Symptomatic / Request for [place] took home test and it's pos. She has sinus drainage, sore throat, cough. Translation No Nurse Assessment Nurse: Valentino Nose, RN, [place] Date/Time (Eastern Time): 07/30/2021 5:41:04 PM Confirm and document reason for call. If symptomatic, describe symptoms. ---Caller states she took an at home test and tested positive for COVID. Symptoms started on Monday and Tuesday with a really bad headache. She thought it was her sinuses. She also has a sore/scratchy throat and a cough. No fever. Caller is vaccinated. Does the patient have any new or worsening symptoms? ---Yes Will a triage be completed? ---Yes Related visit to physician within the last 2 weeks? ---No Does the PT have any chronic conditions? (i.e. diabetes, asthma, this includes High risk factors for pregnancy, etc.) ---Yes List chronic conditions. ---DM, sleep apnea Is the patient pregnant or possibly pregnant? (Ask all females between the ages of 27-55) ---No Is this a behavioral health or substance abuse call? ---No Guidelines Guideline Title Affirmed Question Affirmed Notes Nurse Date/Time (Eastern Time) COVID-19 - Diagnosed or Suspected [1] HIGH RISK for severe COVID complications (e.g., weak immune system, age > 69 years,  obesity with BMI 30 or higher, Valentino Nose, RN, [place] 07/30/2021 5:42:59 PM PLEASE NOTE: All timestamps contained within this report are represented as [place] Standard Time. CONFIDENTIALTY NOTICE: This fax transmission is intended only for the addressee. It contains information that is legally privileged, confidential or otherwise protected from use or disclosure. If you are not the intended recipient, you are strictly prohibited from reviewing, disclosing, copying using or disseminating any of this information or taking any action in reliance on or regarding this information. If you have received this fax in error, please notify us immediately by telephone so that we can arrange for its return to [place]. Phone: [phone], Toll-Free: [phone], Fax: [phone] Page: 2 of 2 Call Id: [id number] Guidelines Guideline Title Affirmed Question Affirmed Notes Nurse Date/Time Eilene Ghazi Time) pregnant, chronic lung disease or other chronic medical condition) AND [2] COVID symptoms (e.g., cough, fever) (Exceptions: Already seen by PCP and no new or worsening symptoms.) Disp. Time Eilene Ghazi Time) Disposition Final User 07/30/2021 5:45:31 PM Paged On Call back to [facility], [place], [place] 07/30/2021 5:46:40 PM Paged On Call back to [facility], [place], [place] 07/30/2021 5:46:13 PM Call PCP within 24 Hours Yes Valentino Nose, RN, [place] Disagree/Comply Comply Caller Understands Yes PreDisposition Call Doctor Care Advice Given Per Guideline CALL PCP WITHIN 24 HOURS: * IF OFFICE WILL BE CLOSED: I'll page the on-call provider now. EXCEPTION: from 9 pm to 9 am. Since this isn't urgent, we'll hold the page until morning. Comments User: Laverna Peace, RN Date/Time Eilene Ghazi Time): 07/30/2021 6:12:12 PM Notified caller that OCP will be sending in antiviral medication. Paging DoctorName Phone DateTime Result/ Outcome Message Type Notes Ria Bush - MD  [phone] 07/30/2021 5:45:30 PM Paged On Call Back to Call Center Doctor Paged Pt  tested positive for COVID today. Pt is considered high risk. [place], [place] Ria Bush - MD 07/30/2021 6:10:42 PM Spoke with On Call - General Message Result OCP states he will call in an antiviral medication for the pt and for pt to keep [place] uppdated on how she is doing

## 2021-08-04 ENCOUNTER — Telehealth: Payer: BC Managed Care – PPO | Admitting: Family Medicine

## 2021-08-05 DIAGNOSIS — F341 Dysthymic disorder: Secondary | ICD-10-CM | POA: Diagnosis not present

## 2021-08-05 DIAGNOSIS — F4010 Social phobia, unspecified: Secondary | ICD-10-CM | POA: Diagnosis not present

## 2021-08-16 DIAGNOSIS — M545 Low back pain, unspecified: Secondary | ICD-10-CM | POA: Insufficient documentation

## 2021-08-24 DIAGNOSIS — M5416 Radiculopathy, lumbar region: Secondary | ICD-10-CM | POA: Diagnosis not present

## 2021-08-27 DIAGNOSIS — F5105 Insomnia due to other mental disorder: Secondary | ICD-10-CM | POA: Diagnosis not present

## 2021-08-27 DIAGNOSIS — F4010 Social phobia, unspecified: Secondary | ICD-10-CM | POA: Diagnosis not present

## 2021-08-27 DIAGNOSIS — F39 Unspecified mood [affective] disorder: Secondary | ICD-10-CM | POA: Diagnosis not present

## 2021-08-27 DIAGNOSIS — F411 Generalized anxiety disorder: Secondary | ICD-10-CM | POA: Diagnosis not present

## 2021-08-27 DIAGNOSIS — F341 Dysthymic disorder: Secondary | ICD-10-CM | POA: Diagnosis not present

## 2021-08-28 DIAGNOSIS — M5416 Radiculopathy, lumbar region: Secondary | ICD-10-CM | POA: Diagnosis not present

## 2021-08-30 ENCOUNTER — Other Ambulatory Visit (INDEPENDENT_AMBULATORY_CARE_PROVIDER_SITE_OTHER): Payer: Self-pay | Admitting: Adult Health

## 2021-08-30 DIAGNOSIS — E1169 Type 2 diabetes mellitus with other specified complication: Secondary | ICD-10-CM

## 2021-08-30 DIAGNOSIS — E785 Hyperlipidemia, unspecified: Secondary | ICD-10-CM

## 2021-08-31 NOTE — Telephone Encounter (Signed)
Last OV with Katy 

## 2021-09-01 ENCOUNTER — Ambulatory Visit (INDEPENDENT_AMBULATORY_CARE_PROVIDER_SITE_OTHER): Payer: BC Managed Care – PPO | Admitting: Physician Assistant

## 2021-09-01 ENCOUNTER — Other Ambulatory Visit: Payer: Self-pay

## 2021-09-01 ENCOUNTER — Encounter (INDEPENDENT_AMBULATORY_CARE_PROVIDER_SITE_OTHER): Payer: Self-pay | Admitting: Physician Assistant

## 2021-09-01 VITALS — BP 102/66 | HR 84 | Temp 97.9°F | Ht 64.0 in | Wt 247.0 lb

## 2021-09-01 DIAGNOSIS — Z9189 Other specified personal risk factors, not elsewhere classified: Secondary | ICD-10-CM

## 2021-09-01 DIAGNOSIS — E1169 Type 2 diabetes mellitus with other specified complication: Secondary | ICD-10-CM | POA: Diagnosis not present

## 2021-09-01 DIAGNOSIS — Z6841 Body Mass Index (BMI) 40.0 and over, adult: Secondary | ICD-10-CM

## 2021-09-01 DIAGNOSIS — E785 Hyperlipidemia, unspecified: Secondary | ICD-10-CM | POA: Diagnosis not present

## 2021-09-01 MED ORDER — VICTOZA 18 MG/3ML ~~LOC~~ SOPN
1.8000 mg | PEN_INJECTOR | Freq: Every day | SUBCUTANEOUS | 0 refills | Status: DC
Start: 2021-09-01 — End: 2022-02-23

## 2021-09-01 NOTE — Progress Notes (Signed)
Chief Complaint:   OBESITY Lisa Curry is here to discuss her progress with her obesity treatment plan along with follow-up of her obesity related diagnoses. Lisa Curry is on keeping a food journal and adhering to recommended goals of 1300-1500 calories and 95 grams of protein daily and states she is following her eating plan approximately 0% of the time. Lisa Curry states she is doing 0 minutes 0 times per week.  Today's visit was #: 5 Starting weight: 279 lbs Starting date: 07/26/2018 Today's weight: 247 lbs Today's date: 09/01/2021 Total lbs lost to date: 32 Total lbs lost since last in-office visit: 0  Interim History: Lisa Curry stays with her daughter when her daughter is working. She has had some issues with her IT band and her back. She is following up with Ortho tomorrow. She is not journaling. She is eating 3 meals a day and she is snacking more often at night.  Subjective:   1. Type 2 diabetes mellitus with hyperlipidemia ([name]) Lisa Curry is on Victoza 1.8 mg, and her blood sugars are averaging between 90-176. She was on prednisone dose pack 2 times since her last visit.  2. At risk for heart disease Lisa Curry is at a higher than average risk for cardiovascular disease due to obesity.   Assessment/Plan:   1. Type 2 diabetes mellitus with hyperlipidemia (Lisa Curry) [name] will continue Victoza 1.8 mg and we will refill for 90 days with no refills. Good blood sugar control is important to decrease the likelihood of diabetic complications such as nephropathy, neuropathy, limb loss, blindness, coronary artery disease, and death. Intensive lifestyle modification including diet, exercise and weight loss are the first line of treatment for diabetes.   - liraglutide (VICTOZA) 18 MG/3ML SOPN; Inject 1.8 mg into the skin daily.  Dispense: 27 mL; Refill: 0  2. At risk for heart disease [name] was given approximately 15 minutes of coronary artery disease prevention counseling today. She is 50 y.o. female and has risk  factors for heart disease including obesity. We discussed intensive lifestyle modifications today with an emphasis on specific weight loss instructions and strategies.   Repetitive spaced learning was employed today to elicit superior memory formation and behavioral change.  3. Obesity, with current BMI 42.38 Lisa Curry is currently in the action stage of change. As such, her goal is to continue with weight loss efforts. She has agreed to keeping a food journal and adhering to recommended goals of 1300-1500 calories and 95 grams of protein daily.   Exercise goals: No exercise has been prescribed at this time.  Behavioral modification strategies: no skipping meals and keeping a strict food journal.  Lisa Curry has agreed to follow-up with our clinic in 8 weeks. She was informed of the importance of frequent follow-up visits to maximize her success with intensive lifestyle modifications for her multiple health conditions.   Objective:   Blood pressure 102/66, pulse 84, temperature 97.9 F (36.6 C), height 5\' 4"  (1.626 m), weight 247 lb (112 kg), SpO2 99 %. Body mass index is 42.4 kg/m.  General: Cooperative, alert, well developed, in no acute distress. HEENT: Conjunctivae and lids unremarkable. Cardiovascular: Regular rhythm.  Lungs: Normal work of breathing. Neurologic: No focal deficits.   Lab Results  Component Value Date   CREATININE 0.71 06/01/2021   BUN 15 06/01/2021   NA 139 06/01/2021   K 4.3 06/01/2021   CL 99 06/01/2021   CO2 25 06/01/2021   Lab Results  Component Value Date   ALT 14 06/01/2021  AST 18 06/01/2021   ALKPHOS 92 06/01/2021   BILITOT 0.4 06/01/2021   Lab Results  Component Value Date   HGBA1C 5.4 06/01/2021   HGBA1C 5.5 12/30/2020   HGBA1C 6.0 (H) 06/16/2020   HGBA1C 6.2 (H) 12/10/2019   HGBA1C 6.2 (H) 08/01/2019   Lab Results  Component Value Date   INSULIN 5.4 06/01/2021   INSULIN 12.0 06/16/2020   INSULIN 9.4 12/10/2019   INSULIN 9.8 11/28/2018    INSULIN 20.0 07/26/2018   Lab Results  Component Value Date   TSH 2.330 12/10/2019   Lab Results  Component Value Date   CHOL 141 06/01/2021   HDL 52 06/01/2021   LDLCALC 73 06/01/2021   LDLDIRECT 73.0 04/18/2017   TRIG 82 06/01/2021   CHOLHDL 2.7 06/01/2021   Lab Results  Component Value Date   VD25OH 60.0 06/01/2021   VD25OH 51.4 06/16/2020   VD25OH 62.0 12/10/2019   Lab Results  Component Value Date   WBC 8.1 12/30/2020   HGB 12.8 12/30/2020   HCT 40.4 12/30/2020   MCV 95.7 12/30/2020   PLT 272 12/30/2020   Lab Results  Component Value Date   IRON 62 05/30/2016   FERRITIN 55.2 05/30/2016   Attestation Statements:   Reviewed by clinician on day of visit: allergies, medications, problem list, medical history, surgical history, family history, social history, and previous encounter notes.   Wilhemena Durie, am acting as transcriptionist for [employer], PA-C.  I have reviewed the above documentation for accuracy and completeness, and I agree with the above. Abby Potash, PA-C

## 2021-09-02 DIAGNOSIS — Z79891 Long term (current) use of opiate analgesic: Secondary | ICD-10-CM | POA: Diagnosis not present

## 2021-09-02 DIAGNOSIS — M5416 Radiculopathy, lumbar region: Secondary | ICD-10-CM | POA: Insufficient documentation

## 2021-09-02 DIAGNOSIS — M5136 Other intervertebral disc degeneration, lumbar region: Secondary | ICD-10-CM | POA: Diagnosis not present

## 2021-09-09 ENCOUNTER — Encounter (INDEPENDENT_AMBULATORY_CARE_PROVIDER_SITE_OTHER): Payer: Self-pay | Admitting: Physician Assistant

## 2021-09-14 ENCOUNTER — Encounter: Payer: Self-pay | Admitting: Family Medicine

## 2021-09-14 DIAGNOSIS — E119 Type 2 diabetes mellitus without complications: Secondary | ICD-10-CM

## 2021-09-14 DIAGNOSIS — E559 Vitamin D deficiency, unspecified: Secondary | ICD-10-CM

## 2021-09-14 DIAGNOSIS — Z1159 Encounter for screening for other viral diseases: Secondary | ICD-10-CM

## 2021-09-14 DIAGNOSIS — E7849 Other hyperlipidemia: Secondary | ICD-10-CM

## 2021-09-16 DIAGNOSIS — F4010 Social phobia, unspecified: Secondary | ICD-10-CM | POA: Diagnosis not present

## 2021-09-16 DIAGNOSIS — F341 Dysthymic disorder: Secondary | ICD-10-CM | POA: Diagnosis not present

## 2021-09-16 NOTE — Telephone Encounter (Signed)
Labs ordered. She would like to come in this year for labwork - plz schedule.  I also asked her to schedule CPE after 09/30/2021.

## 2021-09-17 ENCOUNTER — Other Ambulatory Visit: Payer: Self-pay

## 2021-09-17 ENCOUNTER — Other Ambulatory Visit (INDEPENDENT_AMBULATORY_CARE_PROVIDER_SITE_OTHER): Payer: BC Managed Care – PPO

## 2021-09-17 DIAGNOSIS — Z1159 Encounter for screening for other viral diseases: Secondary | ICD-10-CM

## 2021-09-17 DIAGNOSIS — E119 Type 2 diabetes mellitus without complications: Secondary | ICD-10-CM

## 2021-09-17 DIAGNOSIS — E7849 Other hyperlipidemia: Secondary | ICD-10-CM

## 2021-09-17 DIAGNOSIS — E559 Vitamin D deficiency, unspecified: Secondary | ICD-10-CM | POA: Diagnosis not present

## 2021-09-17 LAB — COMPREHENSIVE METABOLIC PANEL
ALT: 29 U/L (ref 0–35)
AST: 27 U/L (ref 0–37)
Albumin: 4.1 g/dL (ref 3.5–5.2)
Alkaline Phosphatase: 115 U/L (ref 39–117)
BUN: 9 mg/dL (ref 6–23)
CO2: 30 mEq/L (ref 19–32)
Calcium: 9.4 mg/dL (ref 8.4–10.5)
Chloride: 103 mEq/L (ref 96–112)
Creatinine, Ser: 0.81 mg/dL (ref 0.40–1.20)
GFR: 84.05 mL/min (ref >60.00)
Glucose, Bld: 87 mg/dL (ref 70–99)
Potassium: 4 mEq/L (ref 3.5–5.1)
Sodium: 141 mEq/L (ref 135–145)
Total Bilirubin: 0.6 mg/dL (ref 0.2–1.2)
Total Protein: 6.6 g/dL (ref 6.0–8.3)

## 2021-09-17 LAB — LIPID PANEL
Cholesterol: 160 mg/dL (ref 0–200)
HDL: 52 mg/dL (ref >39.00)
LDL Cholesterol: 77 mg/dL (ref 0–99)
NonHDL: 108
Total CHOL/HDL Ratio: 3
Triglycerides: 154 mg/dL — ABNORMAL HIGH (ref 0.0–149.0)
VLDL: 30.8 mg/dL (ref 0.0–40.0)

## 2021-09-17 LAB — MICROALBUMIN / CREATININE URINE RATIO
Creatinine,U: 22 mg/dL
Microalb Creat Ratio: 3.2 mg/g (ref 0.0–30.0)
Microalb, Ur: <0.7 mg/dL (ref 0.0–1.9)

## 2021-09-17 LAB — VITAMIN D 25 HYDROXY (VIT D DEFICIENCY, FRACTURES): VITD: 46.89 ng/mL (ref 30.00–100.00)

## 2021-09-17 LAB — HEMOGLOBIN A1C: Hgb A1c MFr Bld: 5.7 % (ref 4.6–6.5)

## 2021-09-17 NOTE — Telephone Encounter (Signed)
Pt stated she will call back at another time to schedule cpe/lab and I also sent mychart letter

## 2021-09-20 LAB — HEPATITIS C ANTIBODY
Hepatitis C Ab: NONREACTIVE
SIGNAL TO CUT-OFF: <0.02 (ref <1.00)

## 2021-09-24 DIAGNOSIS — M5451 Vertebrogenic low back pain: Secondary | ICD-10-CM | POA: Diagnosis not present

## 2021-09-30 DIAGNOSIS — F4010 Social phobia, unspecified: Secondary | ICD-10-CM | POA: Diagnosis not present

## 2021-09-30 DIAGNOSIS — F341 Dysthymic disorder: Secondary | ICD-10-CM | POA: Diagnosis not present

## 2021-10-04 DIAGNOSIS — M5416 Radiculopathy, lumbar region: Secondary | ICD-10-CM | POA: Diagnosis not present

## 2021-10-08 DIAGNOSIS — M5451 Vertebrogenic low back pain: Secondary | ICD-10-CM | POA: Diagnosis not present

## 2021-10-08 DIAGNOSIS — M5416 Radiculopathy, lumbar region: Secondary | ICD-10-CM | POA: Diagnosis not present

## 2021-10-14 DIAGNOSIS — M5416 Radiculopathy, lumbar region: Secondary | ICD-10-CM | POA: Diagnosis not present

## 2021-10-14 DIAGNOSIS — F4010 Social phobia, unspecified: Secondary | ICD-10-CM | POA: Diagnosis not present

## 2021-10-14 DIAGNOSIS — F341 Dysthymic disorder: Secondary | ICD-10-CM | POA: Diagnosis not present

## 2021-10-14 DIAGNOSIS — M5451 Vertebrogenic low back pain: Secondary | ICD-10-CM | POA: Diagnosis not present

## 2021-10-14 DIAGNOSIS — M545 Low back pain, unspecified: Secondary | ICD-10-CM | POA: Insufficient documentation

## 2021-10-19 DIAGNOSIS — M5451 Vertebrogenic low back pain: Secondary | ICD-10-CM | POA: Diagnosis not present

## 2021-10-19 DIAGNOSIS — M5416 Radiculopathy, lumbar region: Secondary | ICD-10-CM | POA: Diagnosis not present

## 2021-10-27 ENCOUNTER — Ambulatory Visit (INDEPENDENT_AMBULATORY_CARE_PROVIDER_SITE_OTHER): Payer: BC Managed Care – PPO | Admitting: Physician Assistant

## 2021-10-28 DIAGNOSIS — F341 Dysthymic disorder: Secondary | ICD-10-CM | POA: Diagnosis not present

## 2021-10-28 DIAGNOSIS — F4010 Social phobia, unspecified: Secondary | ICD-10-CM | POA: Diagnosis not present

## 2021-11-02 DIAGNOSIS — M5416 Radiculopathy, lumbar region: Secondary | ICD-10-CM | POA: Diagnosis not present

## 2021-11-02 DIAGNOSIS — M5451 Vertebrogenic low back pain: Secondary | ICD-10-CM | POA: Diagnosis not present

## 2021-11-10 DIAGNOSIS — M47816 Spondylosis without myelopathy or radiculopathy, lumbar region: Secondary | ICD-10-CM | POA: Insufficient documentation

## 2021-11-10 DIAGNOSIS — Z5181 Encounter for therapeutic drug level monitoring: Secondary | ICD-10-CM | POA: Diagnosis not present

## 2021-11-10 DIAGNOSIS — M5136 Other intervertebral disc degeneration, lumbar region: Secondary | ICD-10-CM | POA: Diagnosis not present

## 2021-11-10 DIAGNOSIS — Z79899 Other long term (current) drug therapy: Secondary | ICD-10-CM | POA: Diagnosis not present

## 2021-11-10 DIAGNOSIS — M5416 Radiculopathy, lumbar region: Secondary | ICD-10-CM | POA: Diagnosis not present

## 2021-11-10 DIAGNOSIS — M961 Postlaminectomy syndrome, not elsewhere classified: Secondary | ICD-10-CM | POA: Diagnosis not present

## 2021-11-11 DIAGNOSIS — F341 Dysthymic disorder: Secondary | ICD-10-CM | POA: Diagnosis not present

## 2021-11-11 DIAGNOSIS — F4010 Social phobia, unspecified: Secondary | ICD-10-CM | POA: Diagnosis not present

## 2021-11-24 DIAGNOSIS — F5105 Insomnia due to other mental disorder: Secondary | ICD-10-CM | POA: Diagnosis not present

## 2021-11-24 DIAGNOSIS — F39 Unspecified mood [affective] disorder: Secondary | ICD-10-CM | POA: Diagnosis not present

## 2021-11-24 DIAGNOSIS — F411 Generalized anxiety disorder: Secondary | ICD-10-CM | POA: Diagnosis not present

## 2021-11-25 DIAGNOSIS — F4010 Social phobia, unspecified: Secondary | ICD-10-CM | POA: Diagnosis not present

## 2021-11-25 DIAGNOSIS — F341 Dysthymic disorder: Secondary | ICD-10-CM | POA: Diagnosis not present

## 2021-11-30 ENCOUNTER — Encounter (INDEPENDENT_AMBULATORY_CARE_PROVIDER_SITE_OTHER): Payer: Self-pay | Admitting: Physician Assistant

## 2021-11-30 ENCOUNTER — Ambulatory Visit (INDEPENDENT_AMBULATORY_CARE_PROVIDER_SITE_OTHER): Payer: BC Managed Care – PPO | Admitting: Physician Assistant

## 2021-11-30 ENCOUNTER — Other Ambulatory Visit (INDEPENDENT_AMBULATORY_CARE_PROVIDER_SITE_OTHER): Payer: Self-pay | Admitting: Physician Assistant

## 2021-11-30 ENCOUNTER — Other Ambulatory Visit: Payer: Self-pay

## 2021-11-30 VITALS — BP 114/73 | HR 86 | Temp 98.1°F | Ht 64.0 in | Wt 254.0 lb

## 2021-11-30 DIAGNOSIS — Z9189 Other specified personal risk factors, not elsewhere classified: Secondary | ICD-10-CM

## 2021-11-30 DIAGNOSIS — E1169 Type 2 diabetes mellitus with other specified complication: Secondary | ICD-10-CM

## 2021-11-30 DIAGNOSIS — Z6841 Body Mass Index (BMI) 40.0 and over, adult: Secondary | ICD-10-CM | POA: Diagnosis not present

## 2021-11-30 DIAGNOSIS — E669 Obesity, unspecified: Secondary | ICD-10-CM | POA: Diagnosis not present

## 2021-11-30 DIAGNOSIS — E785 Hyperlipidemia, unspecified: Secondary | ICD-10-CM | POA: Diagnosis not present

## 2021-11-30 MED ORDER — TIRZEPATIDE 2.5 MG/0.5ML ~~LOC~~ SOAJ: 2.5000 mg | SUBCUTANEOUS | 0 refills | Status: DC

## 2021-11-30 NOTE — Telephone Encounter (Signed)
Tracey pt. PA needed. Please send PA updates on this pt to [place]. ?

## 2021-12-01 NOTE — Progress Notes (Signed)
? ? ? ?Chief Complaint:  ? ?OBESITY ?Lisa Curry is here to discuss her progress with her obesity treatment plan along with follow-up of her obesity related diagnoses. Lisa Curry is on keeping a food journal and adhering to recommended goals of 1300-1500 calories and 95 grams of protein and states she is following her eating plan approximately 0% of the time. Lisa Curry states she is doing 0 minutes 0 times per week. ? ?Today's visit was #: 36 ?Starting weight: 279 lbs ?Starting date: 07/26/2018 ?Today's weight: 254 lbs ?Today's date: 11/30/2021 ?Total lbs lost to date: 25 lbs ?Total lbs lost since last in-office visit: 0 ? ?Interim History: Amrie continues to stay with her daughter, who now is having some health issues. Her grandmother recently passed away. She has been doing a lot of mindless eating and emotional eating, which has mainly been carbohydrates.  ? ?Subjective:  ? ?1. Type 2 diabetes mellitus with hyperlipidemia ([place]) ?Lisa Curry's last A1C was 5.7. She is on Victoza 18 mg. Her appetite is not controlled. Her blood sugar fasting averaging 100-150. She denies hypoglycemia.  ? ?2. At risk for hypoglycemia ?Lisa Curry is at increased risk for hypoglycemia due to changes in diet, diagnosis of diabetes, and/or insulin use. Lisa Curry is currently taking insulin.   ? ?Assessment/Plan:  ? ?1. Type 2 diabetes mellitus with hyperlipidemia ([place]) ?Kevin agrees to start Mounjaro 2.5 mg for 1 month with no  refills. Good blood sugar control is important to decrease the likelihood of diabetic complications such as nephropathy, neuropathy, limb loss, blindness, coronary artery disease, and death. Intensive lifestyle modification including diet, exercise and weight loss are the first line of treatment for diabetes.  ? ?- tirzepatide (MOUNJARO) 2.5 MG/0.5ML Pen; Inject 2.5 mg into the skin once a week.  Dispense: 2 mL; Refill: 0 ? ?2. At risk for hypoglycemia ?Darl was given approximately 15 minutes of counseling today regarding prevention of hypoglycemia.  She was advised of symptoms of hypoglycemia. Sahian was instructed to avoid skipping meals, eat regular protein rich meals, and schedule low calorie snacks as needed. ? ?Repetitive spaced learning was employed today to elicit superior memory formation and behavioral change.  ? ?3. Obesity, with current BMI 43.58 ?Lisa Curry is currently in the action stage of change. As such, her goal is to continue with weight loss efforts. She has agreed to keeping a food journal and adhering to recommended goals of 1300-1500 calories and 95 grams of protein daily.  ? ?Exercise goals: No exercise has been prescribed at this time. ? ?Behavioral modification strategies: increasing lean protein intake and decreasing simple carbohydrates. ? ?Lisa Curry has agreed to follow-up with our clinic in 3-4 weeks. She was informed of the importance of frequent follow-up visits to maximize her success with intensive lifestyle modifications for her multiple health conditions.  ? ?Objective:  ? ?Blood pressure 114/73, pulse 86, temperature 98.1 ?F (36.7 ?C), height '5\' 4"'$  (1.626 m), weight 254 lb (115.2 kg), SpO2 99 %. ?Body mass index is 43.6 kg/m?. ? ?General: Cooperative, alert, well developed, in no acute distress. ?HEENT: Conjunctivae and lids unremarkable. ?Cardiovascular: Regular rhythm.  ?Lungs: Normal work of breathing. ?Neurologic: No focal deficits.  ? ?Lab Results  ?Component Value Date  ? CREATININE 0.81 09/17/2021  ? BUN 9 09/17/2021  ? NA 141 09/17/2021  ? K 4.0 09/17/2021  ? CL 103 09/17/2021  ? CO2 30 09/17/2021  ? ?Lab Results  ?Component Value Date  ? ALT 29 09/17/2021  ? AST 27 09/17/2021  ? ALKPHOS 115 09/17/2021  ?  BILITOT 0.6 09/17/2021  ? ?Lab Results  ?Component Value Date  ? HGBA1C 5.7 09/17/2021  ? HGBA1C 5.4 06/01/2021  ? HGBA1C 5.5 12/30/2020  ? HGBA1C 6.0 (H) 06/16/2020  ? HGBA1C 6.2 (H) 12/10/2019  ? ?Lab Results  ?Component Value Date  ? INSULIN 5.4 06/01/2021  ? INSULIN 12.0 06/16/2020  ? INSULIN 9.4 12/10/2019  ? INSULIN 9.8  11/28/2018  ? INSULIN 20.0 07/26/2018  ? ?Lab Results  ?Component Value Date  ? TSH 2.330 12/10/2019  ? ?Lab Results  ?Component Value Date  ? CHOL 160 09/17/2021  ? HDL 52.00 09/17/2021  ? [place] 77 09/17/2021  ? LDLDIRECT 73.0 04/18/2017  ? TRIG 154.0 (H) 09/17/2021  ? CHOLHDL 3 09/17/2021  ? ?Lab Results  ?Component Value Date  ? VD25OH 46.89 09/17/2021  ? VD25OH 60.0 06/01/2021  ? VD25OH 51.4 06/16/2020  ? ?Lab Results  ?Component Value Date  ? WBC 8.1 12/30/2020  ? HGB 12.8 12/30/2020  ? HCT 40.4 12/30/2020  ? MCV 95.7 12/30/2020  ? PLT 272 12/30/2020  ? ?Lab Results  ?Component Value Date  ? IRON 62 05/30/2016  ? FERRITIN 55.2 05/30/2016  ? ?Attestation Statements:  ? ?Reviewed by clinician on day of visit: allergies, medications, problem list, medical history, surgical history, family history, social history, and previous encounter notes. ? ?I, Tonye Pearson, am acting as Location manager for [employer], PA-C. ? ?I have reviewed the above documentation for accuracy and completeness, and I agree with the above. Abby Potash, PA-C ? ?

## 2021-12-01 NOTE — Telephone Encounter (Signed)
Prior authorization has been started for Mounjaro. Will notify patient and provider once response is received.  

## 2021-12-02 ENCOUNTER — Encounter (INDEPENDENT_AMBULATORY_CARE_PROVIDER_SITE_OTHER): Payer: Self-pay

## 2021-12-09 DIAGNOSIS — F341 Dysthymic disorder: Secondary | ICD-10-CM | POA: Diagnosis not present

## 2021-12-09 DIAGNOSIS — F4010 Social phobia, unspecified: Secondary | ICD-10-CM | POA: Diagnosis not present

## 2021-12-10 ENCOUNTER — Encounter: Payer: Self-pay | Admitting: Family Medicine

## 2021-12-10 ENCOUNTER — Other Ambulatory Visit: Payer: Self-pay

## 2021-12-10 ENCOUNTER — Other Ambulatory Visit (HOSPITAL_COMMUNITY)
Admission: RE | Admit: 2021-12-10 | Discharge: 2021-12-10 | Disposition: A | Discharge status: Home / Self Care | Payer: BC Managed Care – PPO | Source: Ambulatory Visit | Attending: Family Medicine | Admitting: Family Medicine

## 2021-12-10 ENCOUNTER — Ambulatory Visit: Payer: BC Managed Care – PPO | Admitting: Family Medicine

## 2021-12-10 VITALS — BP 128/68 | HR 84 | Temp 97.3°F | Ht 63.0 in | Wt 256.4 lb

## 2021-12-10 DIAGNOSIS — Z01419 Encounter for gynecological examination (general) (routine) without abnormal findings: Secondary | ICD-10-CM | POA: Insufficient documentation

## 2021-12-10 DIAGNOSIS — Z0000 Encounter for general adult medical examination without abnormal findings: Secondary | ICD-10-CM | POA: Diagnosis not present

## 2021-12-10 DIAGNOSIS — E559 Vitamin D deficiency, unspecified: Secondary | ICD-10-CM

## 2021-12-10 DIAGNOSIS — F331 Major depressive disorder, recurrent, moderate: Secondary | ICD-10-CM

## 2021-12-10 DIAGNOSIS — Z23 Encounter for immunization: Secondary | ICD-10-CM

## 2021-12-10 DIAGNOSIS — E119 Type 2 diabetes mellitus without complications: Secondary | ICD-10-CM

## 2021-12-10 DIAGNOSIS — Z8719 Personal history of other diseases of the digestive system: Secondary | ICD-10-CM

## 2021-12-10 DIAGNOSIS — K760 Fatty (change of) liver, not elsewhere classified: Secondary | ICD-10-CM

## 2021-12-10 DIAGNOSIS — K219 Gastro-esophageal reflux disease without esophagitis: Secondary | ICD-10-CM | POA: Diagnosis not present

## 2021-12-10 DIAGNOSIS — M5441 Lumbago with sciatica, right side: Secondary | ICD-10-CM

## 2021-12-10 DIAGNOSIS — Z9889 Other specified postprocedural states: Secondary | ICD-10-CM

## 2021-12-10 DIAGNOSIS — E7849 Other hyperlipidemia: Secondary | ICD-10-CM

## 2021-12-10 DIAGNOSIS — G8929 Other chronic pain: Secondary | ICD-10-CM

## 2021-12-10 DIAGNOSIS — Z6841 Body Mass Index (BMI) 40.0 and over, adult: Secondary | ICD-10-CM

## 2021-12-10 MED ORDER — ATORVASTATIN CALCIUM 20 MG PO TABS: ORAL_TABLET | ORAL | 3 refills | Status: DC

## 2021-12-10 NOTE — Assessment & Plan Note (Addendum)
Stable period off PPI 

## 2021-12-10 NOTE — Assessment & Plan Note (Addendum)
Encourage healthy diet and lifestyle choices to affect sustainable weight loss.  ?She continues seeing [facility], working towards transition from [place] to [employer].  ?

## 2021-12-10 NOTE — Progress Notes (Signed)
? ? Patient ID: Lisa Curry, female    DOB: 04-01-1971, 51 y.o.   MRN: [id number] ? ?This visit was conducted in person. ? ?BP 128/68   Pulse 84   Temp (!) 97.3 ?F (36.3 ?C) (Temporal)   Ht '5\' 3"'$  (1.6 m)   Wt 256 lb 6 oz (116.3 kg)   LMP 05/29/2021   SpO2 96%   BMI 45.41 kg/m?   ? ?CC: CPE ?Subjective:  ? ?HPI: ?Lisa Curry is a 51 y.o. female presenting on 12/10/2021 for Annual Exam ? ? ?Last seen 09/2020.  ?Continues seeing [facility], last seen 11/30/2021. Most recently victoza changed to [facility].  ? ?OSA on CPAP followed by neurology. [facility].  ? ?EGD - ?h/o barrett's during EGD at [facility]. Rpt EGD with [facility] 10/2017 - WNL  ? ?Depression/anxiety - receiving treatment through psych (Dr Nicolasa Ducking) ? ?DM - planning to start Mounjaro through [facility]. Insurance may not cover this. Currently on daily victoza - fasting sugars 100-150 ?  ?Umbilical hernia repair 03/9023.  ? ?She recently had epidural steroid injections for lower back pain. She's also been taking flexeril and tylenol #3 more regularly because of this - notes more mental fogginess which has persisted despite decreasing use of these meds. Wonders about long COVID - symptoms started around the time she had COVID 07/2021. .  ? ?Preventative: ?Colonoscopy 01/2021 - TA x2, rpt 7 yrs Ardis Hughs) ?Mammogram: 02/2020 [facility] Humphrey.  ?Well woman exam through PCP - pap smear: 2012, 2015, 2016, 2019 WNL (absent transformation zone). Will rpt today.  ?LMP 05/2021, previously regular. Doesn't note menopausal symptoms.  ?DEXA scan - not due ?Lung cancer screening - not eligible  ?Flu: yearly ?COVID vaccine J&J 12/2019, no booster ?Pneumovax 2015 ?Tdap 09/2020 ?Shingrix - discussed, to consider  ?Seat belt use discussed  ?Sunscreen use discussed. No changing moles on skin.  ?Non smoker  ?Alcohol - rare ?Eye exam: every other year.  ?Dentist: Q6 mo ?  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 ?Activity: no regular exercise  ?Diet: good water, fruits/vegetables daily  ?   ? ?Relevant past medical, surgical, family and social history reviewed and updated as indicated. Interim medical history since our last visit reviewed. ?Allergies and medications reviewed and updated. ?Outpatient Medications Prior to Visit  ?Medication Sig Dispense Refill  ? acetaminophen-codeine (TYLENOL #3) 300-30 MG tablet Take 1 tablet by mouth every 8 (eight) hours as needed for moderate pain. 15 tablet 0  ? Cholecalciferol (VITAMIN D3) 125 MCG (5000 UT) TABS Take 5,000 Units by mouth daily.    ? cyclobenzaprine (FLEXERIL) 10 MG tablet TAKE 1 TABLET BY MOUTH 2 (TWO) TIMES DAILY AS NEEDED FOR MUSCLE SPASMS (SEDATION PRECAUTIONS). 30 tablet 1  ? glucose blood (ONE TOUCH ULTRA TEST) test strip Use to check sugar twice daily and as needed. Dx: E11.65 300 each 3  ? ibuprofen (ADVIL) 200 MG tablet Take 600 mg by mouth every 8 (eight) hours as needed for moderate pain.    ? Insulin Pen Needle (PEN NEEDLES) 32G X 6 MM MISC 1 each by Does not apply route daily. 100 each 0  ? lamoTRIgine (LAMICTAL) 150 MG tablet Take 150 mg by mouth at bedtime.    ? lamoTRIgine (LAMICTAL) 200 MG tablet Take 1 tablet (200 mg total) by mouth daily. 30 tablet  3  ? Lancet Devices (ONE TOUCH DELICA LANCING DEV) MISC Use as directed with lancets and one touch meter E11.65 1 each 0  ? Multiple Vitamin (MULTIVITAMIN) capsule Take 1 capsule by mouth daily.    ? ONETOUCH DELICA LANCETS 20N MISC Check blood sugar twice daily and as directed. Dx E11.65 200 each 0  ? polyethylene glycol powder (GLYCOLAX/MIRALAX) 17 GM/SCOOP powder Take 17 g by mouth daily. (Patient taking differently: Take 17 g by mouth daily as needed for moderate constipation.) 3350 g 0  ? sertraline (ZOLOFT) 100 MG tablet Take 100 mg by mouth daily.    ? atorvastatin (LIPITOR) 20 MG tablet TAKE 1 TABLET BY MOUTH  DAILY AT 6 PM. 90 tablet 2  ? liraglutide (VICTOZA) 18 MG/3ML SOPN Inject 1.8 mg into the skin daily. 27 mL 0  ? tirzepatide (MOUNJARO) 2.5 MG/0.5ML Pen Inject 2.5 mg into the skin once a week. (Patient not taking: Reported on 12/10/2021) 2 mL 0  ? ?No facility-administered medications prior to visit.  ?  ? ?Per HPI unless specifically indicated in ROS section below ?Review of Systems  ?Constitutional:  Negative for activity change, appetite change, chills, fatigue, fever and unexpected weight change.  ?HENT:  Negative for hearing loss.   ?Eyes:  Negative for visual disturbance.  ?Respiratory:  Negative for cough, chest tightness, shortness of breath and wheezing.   ?Cardiovascular:  Negative for chest pain, palpitations and leg swelling.  ?Gastrointestinal:  Negative for abdominal distention, abdominal pain, blood in stool, constipation, diarrhea, nausea and vomiting.  ?Genitourinary:  Negative for difficulty urinating and hematuria.  ?Musculoskeletal:  Negative for arthralgias, myalgias and neck pain.  ?Skin:  Negative for rash.  ?Neurological:  Negative for dizziness, seizures, syncope and headaches.  ?Hematological:  Negative for adenopathy. Does not bruise/bleed easily.  ?Psychiatric/Behavioral:  Negative for dysphoric mood. The patient is not nervous/anxious.   ? ?Objective:  ?BP 128/68   Pulse 84   Temp (!) 97.3 ?F (36.3 ?C) (Temporal)   Ht '5\' 3"'$  (1.6 m)   Wt 256 lb 6 oz (116.3 kg)   LMP 05/29/2021   SpO2 96%   BMI 45.41 kg/m?   ?Wt Readings from Last 3 Encounters:  ?12/10/21 256 lb 6 oz (116.3 kg)  ?11/30/21 254 lb (115.2 kg)  ?09/01/21 247 lb (112 kg)  ?  ?  ?Physical Exam ?Vitals and nursing note reviewed. Exam conducted with a chaperone present.  ?Constitutional:   ?   Appearance: Normal appearance. She is not ill-appearing.  ?HENT:  ?   Head: Normocephalic and atraumatic.  ?   Right Ear: Tympanic membrane, ear canal and external ear normal. There is no impacted cerumen.  ?   Left Ear: Tympanic  membrane, ear canal and external ear normal. There is no impacted cerumen.  ?Eyes:  ?   General:     ?   Right eye: No discharge.     ?   Left eye: No discharge.  ?   Extraocular Movements: Extraocular movements intact.  ?   Conjunctiva/sclera: Conjunctivae normal.  ?   Pupils: Pupils are equal, round, and reactive to light.  ?Neck:  ?   Thyroid: No thyroid mass or thyromegaly.  ?Cardiovascular:  ?   Rate and Rhythm: Normal rate and regular rhythm.  ?   Pulses: Normal pulses.  ?   Heart sounds: Normal heart sounds. No murmur heard. ?Pulmonary:  ?   Effort: Pulmonary effort is normal. No respiratory distress.  ?   Breath  sounds: Normal breath sounds. No wheezing, rhonchi or rales.  ?Chest:  ?Breasts: ?   Right: Mass (possible small cyst 6 o clock) present. No swelling, bleeding, inverted nipple, nipple discharge or skin change.  ?   Left: Normal. No swelling, bleeding, inverted nipple, mass, nipple discharge or skin change.  ?Abdominal:  ?   General: Bowel sounds are normal. There is no distension.  ?   Palpations: Abdomen is soft. There is no mass.  ?   Tenderness: There is no abdominal tenderness. There is no guarding or rebound.  ?   Hernia: No hernia is present.  ?Genitourinary: ?   Exam position: Supine.  ?   Labia:     ?   Right: No rash, tenderness or lesion.     ?   Left: No rash, tenderness or lesion.   ?   Urethra: No prolapse.  ?   Vagina: Normal.  ?   Cervix: Normal.  ?   Uterus: Normal.   ?   Adnexa: Right adnexa normal and left adnexa normal.  ?   Comments: Pap performed on cervix ?Musculoskeletal:  ?   Cervical back: Normal range of motion and neck supple. No rigidity.  ?   Right lower leg: No edema.  ?   Left lower leg: No edema.  ?Lymphadenopathy:  ?   Cervical: No cervical adenopathy.  ?   Right cervical: No superficial cervical adenopathy. ?   Left cervical: No superficial cervical adenopathy.  ?   Upper Body:  ?   Right upper body: No supraclavicular, axillary or pectoral adenopathy.  ?   Left  upper body: No supraclavicular, axillary or pectoral adenopathy.  ?Skin: ?   General: Skin is warm and dry.  ?   Findings: No rash.  ?Neurological:  ?   General: No focal deficit present.  ?   Mental Status: She is alert.

## 2021-12-10 NOTE — Patient Instructions (Addendum)
Pap smear today  ?First shingrix shot today. Return in 2-6 months for nurse visit to complete series.  ?Call to schedule mammogram at your convenience: ?[facility] in [place] [phone] ?Good to see you today. ?Return as needed or in 1 year for next physical.  ? ?Health Maintenance, Female ?Adopting a healthy lifestyle and getting preventive care are important in promoting health and wellness. Ask your health care provider about: ?The right schedule for you to have regular tests and exams. ?Things you can do on your own to prevent diseases and keep yourself healthy. ?What should I know about diet, weight, and exercise? ?Eat a healthy diet ? ?Eat a diet that includes plenty of vegetables, fruits, low-fat dairy products, and lean protein. ?Do not eat a lot of foods that are high in solid fats, added sugars, or sodium. ?Maintain a healthy weight ?Body mass index (BMI) is used to identify weight problems. It estimates body fat based on height and weight. Your health care provider can help determine your BMI and help you achieve or maintain a healthy weight. ?Get regular exercise ?Get regular exercise. This is one of the most important things you can do for your health. Most adults should: ?Exercise for at least 150 minutes each week. The exercise should increase your heart rate and make you sweat (moderate-intensity exercise). ?Do strengthening exercises at least twice a week. This is in addition to the moderate-intensity exercise. ?Spend less time sitting. Even light physical activity can be beneficial. ?Watch cholesterol and blood lipids ?Have your blood tested for lipids and cholesterol at 52 years of age, then have this test every 5 years. ?Have your cholesterol levels checked more often if: ?Your lipid or cholesterol levels are high. ?You are older than 52 years of age. ?You are at high risk for heart disease. ?What should I know about cancer screening? ?Depending on your health history and family  history, you may need to have cancer screening at various ages. This may include screening for: ?Breast cancer. ?Cervical cancer. ?Colorectal cancer. ?Skin cancer. ?Lung cancer. ?What should I know about heart disease, diabetes, and high blood pressure? ?Blood pressure and heart disease ?High blood pressure causes heart disease and increases the risk of stroke. This is more likely to develop in people who have high blood pressure readings or are overweight. ?Have your blood pressure checked: ?Every 3-5 years if you are 53-59 years of age. ?Every year if you are 68 years old or older. ?Diabetes ?Have regular diabetes screenings. This checks your fasting blood sugar level. Have the screening done: ?Once every three years after age 74 if you are at a normal weight and have a low risk for diabetes. ?More often and at a younger age if you are overweight or have a high risk for diabetes. ?What should I know about preventing infection? ?Hepatitis B ?If you have a higher risk for hepatitis B, you should be screened for this virus. Talk with your health care provider to find out if you are at risk for hepatitis B infection. ?Hepatitis C ?Testing is recommended for: ?Everyone born from [birth year] through 1966. ?Anyone with known risk factors for hepatitis C. ?Sexually transmitted infections (STIs) ?Get screened for STIs, including gonorrhea and chlamydia, if: ?You are sexually active and are younger than 52 years of age. ?You are older than 52 years of age and your health care provider tells you that you are at risk for this type of infection. ?Your sexual activity has changed since you were  last screened, and you are at increased risk for chlamydia or gonorrhea. Ask your health care provider if you are at risk. ?Ask your health care provider about whether you are at high risk for HIV. Your health care provider may recommend a prescription medicine to help prevent HIV infection. If you choose to take medicine to prevent HIV, you  should first get tested for HIV. You should then be tested every 3 months for as long as you are taking the medicine. ?Pregnancy ?If you are about to stop having your period (premenopausal) and you may become pregnant, seek counseling before you get pregnant. ?Take 400 to 800 micrograms (mcg) of folic acid every day if you become pregnant. ?Ask for birth control (contraception) if you want to prevent pregnancy. ?Osteoporosis and menopause ?Osteoporosis is a disease in which the bones lose minerals and strength with aging. This can result in bone fractures. If you are 3 years old or older, or if you are at risk for osteoporosis and fractures, ask your health care provider if you should: ?Be screened for bone loss. ?Take a calcium or vitamin D supplement to lower your risk of fractures. ?Be given hormone replacement therapy (HRT) to treat symptoms of menopause. ?Follow these instructions at home: ?Alcohol use ?Do not drink alcohol if: ?Your health care provider tells you not to drink. ?You are pregnant, may be pregnant, or are planning to become pregnant. ?If you drink alcohol: ?Limit how much you have to: ?0-1 drink a day. ?Know how much alcohol is in your drink. In the [place] one drink equals one 12 oz bottle of beer (355 mL), one 5 oz glass of wine (148 mL), or one 1? oz glass of hard liquor (44 mL). ?Lifestyle ?Do not use any products that contain nicotine or tobacco. These products include cigarettes, chewing tobacco, and vaping devices, such as e-cigarettes. If you need help quitting, ask your health care provider. ?Do not use street drugs. ?Do not share needles. ?Ask your health care provider for help if you need support or information about quitting drugs. ?General instructions ?Schedule regular health, dental, and eye exams. ?Stay current with your vaccines. ?Tell your health care provider if: ?You often feel depressed. ?You have ever been abused or do not feel safe at home. ?Summary ?Adopting a healthy  lifestyle and getting preventive care are important in promoting health and wellness. ?Follow your health care provider's instructions about healthy diet, exercising, and getting tested or screened for diseases. ?Follow your health care provider's instructions on monitoring your cholesterol and blood pressure. ?This information is not intended to replace advice given to you by your health care provider. Make sure you discuss any questions you have with your health care provider. ?Document Revised: 01/25/2021 Document Reviewed: 01/25/2021 ?Elsevier Patient Education ? [place]. ? ?

## 2021-12-10 NOTE — Assessment & Plan Note (Addendum)
Preventative protocols reviewed and updated unless pt declined. ?Discussed healthy diet and lifestyle.  ?Advised she call to schedule mammogram as overdue.  ?

## 2021-12-11 ENCOUNTER — Encounter (INDEPENDENT_AMBULATORY_CARE_PROVIDER_SITE_OTHER): Payer: Self-pay | Admitting: Physician Assistant

## 2021-12-11 NOTE — Assessment & Plan Note (Signed)
Stable vit D levels on 5000 IU daily replacement.  ?

## 2021-12-11 NOTE — Assessment & Plan Note (Signed)
Chronic, stable on GLP1RA through [facility] - planning to transition to GIP/GLP1RA.  ?

## 2021-12-11 NOTE — Assessment & Plan Note (Addendum)
Stable period.  

## 2021-12-11 NOTE — Assessment & Plan Note (Signed)
Appreciate psych care Nicolasa Ducking)  ?

## 2021-12-11 NOTE — Assessment & Plan Note (Signed)
Chronic, stable on atorvastatin '20mg'$  daily - continue ?The 10-year ASCVD risk score (Arnett DK, et al., 2019) is: 2.1% ?  Values used to calculate the score: ?    Age: 51 years ?    Sex: Female ?    Is Non-Hispanic African American: No ?    Diabetic: Yes ?    Tobacco smoker: No ?    Systolic Blood Pressure: 327 mmHg ?    Is BP treated: No ?    HDL Cholesterol: 52 mg/dL ?    Total Cholesterol: 160 mg/dL  ?

## 2021-12-11 NOTE — Assessment & Plan Note (Signed)
Ongoing difficulty, seeing ortho s/p recent ESI.  ?She continues flexeril and tylenol #3 PRN.  ?

## 2021-12-11 NOTE — Assessment & Plan Note (Signed)
LFTs stable.  ?Consider [place].  ?

## 2021-12-13 LAB — CYTOLOGY - PAP
Adequacy: ABSENT
Comment: NEGATIVE
Diagnosis: NEGATIVE
High risk HPV: NEGATIVE

## 2021-12-13 NOTE — Telephone Encounter (Signed)
Last OV with Tracey 

## 2021-12-14 DIAGNOSIS — Z1231 Encounter for screening mammogram for malignant neoplasm of breast: Secondary | ICD-10-CM | POA: Diagnosis not present

## 2021-12-14 LAB — HM MAMMOGRAPHY

## 2021-12-15 ENCOUNTER — Encounter: Payer: Self-pay | Admitting: Family Medicine

## 2021-12-15 DIAGNOSIS — U099 Post covid-19 condition, unspecified: Secondary | ICD-10-CM

## 2021-12-20 ENCOUNTER — Encounter: Payer: Self-pay | Admitting: Family Medicine

## 2021-12-21 NOTE — Telephone Encounter (Signed)
Pt has called to see if Dr Danise [name] has her MyChart message she sent 12-15-21. It is in Dr Synthia Innocent inbox.  ?

## 2021-12-22 NOTE — Telephone Encounter (Signed)
Lisa Curry 

## 2021-12-23 ENCOUNTER — Ambulatory Visit (INDEPENDENT_AMBULATORY_CARE_PROVIDER_SITE_OTHER): Payer: BC Managed Care – PPO | Admitting: Physician Assistant

## 2021-12-23 DIAGNOSIS — F341 Dysthymic disorder: Secondary | ICD-10-CM | POA: Diagnosis not present

## 2021-12-23 DIAGNOSIS — F4010 Social phobia, unspecified: Secondary | ICD-10-CM | POA: Diagnosis not present

## 2022-01-05 ENCOUNTER — Ambulatory Visit (INDEPENDENT_AMBULATORY_CARE_PROVIDER_SITE_OTHER): Payer: BC Managed Care – PPO | Admitting: Physician Assistant

## 2022-01-05 ENCOUNTER — Encounter (INDEPENDENT_AMBULATORY_CARE_PROVIDER_SITE_OTHER): Payer: Self-pay | Admitting: Physician Assistant

## 2022-01-05 VITALS — BP 136/72 | Ht 63.0 in | Wt 255.8 lb

## 2022-01-05 DIAGNOSIS — E785 Hyperlipidemia, unspecified: Secondary | ICD-10-CM | POA: Diagnosis not present

## 2022-01-05 DIAGNOSIS — Z6841 Body Mass Index (BMI) 40.0 and over, adult: Secondary | ICD-10-CM | POA: Diagnosis not present

## 2022-01-05 DIAGNOSIS — E1169 Type 2 diabetes mellitus with other specified complication: Secondary | ICD-10-CM | POA: Diagnosis not present

## 2022-01-05 DIAGNOSIS — E669 Obesity, unspecified: Secondary | ICD-10-CM | POA: Diagnosis not present

## 2022-01-05 DIAGNOSIS — Z7985 Long-term (current) use of injectable non-insulin antidiabetic drugs: Secondary | ICD-10-CM

## 2022-01-06 DIAGNOSIS — F341 Dysthymic disorder: Secondary | ICD-10-CM | POA: Diagnosis not present

## 2022-01-06 DIAGNOSIS — F4010 Social phobia, unspecified: Secondary | ICD-10-CM | POA: Diagnosis not present

## 2022-01-17 NOTE — Progress Notes (Signed)
? ? ? ?Chief Complaint:  ? ?OBESITY ?Lisa Curry is here to discuss her progress with her obesity treatment plan along with follow-up of her obesity related diagnoses. Whitnie is on keeping a food journal and adhering to recommended goals of 1300-1500 calories and 95 grams of protein and states she is following her eating plan approximately 2% of the time. Mekenzie states she is walking and using weights 30 minutes 3 times per week. ? ?Today's visit was #: 57 ?Starting weight: 279 lbs ?Starting date: 07/26/2018 ?Today's weight: 252 lbs ?Today's date: 01/05/2022 ?Total lbs lost to date: 63 ?Total lbs lost since last in-office visit: 2 ? ?Interim History: Saige started journaling her food last week . She also went to the gym 3 times. Rease is not taking care of her daughter as much, as her daughter has asked to be allowed to be more independent. ? ?Subjective:  ? ?1. Type 2 diabetes mellitus with hyperlipidemia ([place]) ?Yomira is currently on Victoza 1.'8mg'$ . Mounjaro not covered by insurance. Her appetite is controlled. ? ?Assessment/Plan:  ? ?1. Type 2 diabetes mellitus with hyperlipidemia ([place]) ?Jamae will continue with Victoza 1.'8mg'$ . ? ?2. Obesity, with current BMI 43.4 ?Gianny is currently in the action stage of change. As such, her goal is to continue with weight loss efforts. She has agreed to keeping a food journal and adhering to recommended goals of 1300-1500 calories and 95 grams of  protein.  ? ?Exercise goals: As is. ? ?Behavioral modification strategies: no skipping meals, planning for success, and keeping a strict food journal. ? ?Marcha has agreed to follow-up with our clinic in 6 weeks. She was informed of the importance of frequent follow-up visits to maximize her success with intensive lifestyle modifications for her multiple health conditions.  ? ?Objective:  ? ?Blood pressure 136/72, height '5\' 3"'$  (1.6 m), weight 255 lb 12.8 oz (116 kg). ?Body mass index is 45.31 kg/m?. ? ?General: Cooperative, alert, well developed, in no  acute distress. ?HEENT: Conjunctivae and lids unremarkable. ?Cardiovascular: Regular rhythm.  ?Lungs: Normal work of breathing. ?Neurologic: No focal deficits.  ? ?Lab Results  ?Component Value Date  ? CREATININE 0.81 09/17/2021  ? BUN 9 09/17/2021  ? NA 141 09/17/2021  ? K 4.0 09/17/2021  ? CL 103 09/17/2021  ? CO2 30 09/17/2021  ? ?Lab Results  ?Component Value Date  ? ALT 29 09/17/2021  ? AST 27 09/17/2021  ? ALKPHOS 115 09/17/2021  ? BILITOT 0.6 09/17/2021  ? ?Lab Results  ?Component Value Date  ? HGBA1C 5.7 09/17/2021  ? HGBA1C 5.4 06/01/2021  ? HGBA1C 5.5 12/30/2020  ? HGBA1C 6.0 (H) 06/16/2020  ? HGBA1C 6.2 (H) 12/10/2019  ? ?Lab Results  ?Component Value Date  ? INSULIN 5.4 06/01/2021  ? INSULIN 12.0 06/16/2020  ? INSULIN 9.4 12/10/2019  ? INSULIN 9.8 11/28/2018  ? INSULIN 20.0 07/26/2018  ? ?Lab Results  ?Component Value Date  ? TSH 2.330 12/10/2019  ? ?Lab Results  ?Component Value Date  ? CHOL 160 09/17/2021  ? HDL 52.00 09/17/2021  ? [place] 77 09/17/2021  ? LDLDIRECT 73.0 04/18/2017  ? TRIG 154.0 (H) 09/17/2021  ? CHOLHDL 3 09/17/2021  ? ?Lab Results  ?Component Value Date  ? VD25OH 46.89 09/17/2021  ? VD25OH 60.0 06/01/2021  ? VD25OH 51.4 06/16/2020  ? ?Lab Results  ?Component Value Date  ? WBC 8.1 12/30/2020  ? HGB 12.8 12/30/2020  ? HCT 40.4 12/30/2020  ? MCV 95.7 12/30/2020  ? PLT 272 12/30/2020  ? ?  Lab Results  ?Component Value Date  ? IRON 62 05/30/2016  ? FERRITIN 55.2 05/30/2016  ? ?Attestation Statements:  ? ?Reviewed by clinician on day of visit: allergies, medications, problem list, medical history, surgical history, family history, social history, and previous encounter notes. ? ?Time spent on visit including pre-visit chart review and post-visit care and charting was 30 minutes.  ? ?I, Brendell Tyus, RMA am acting as transcriptionist for [employer], PA-C. ? ?I have reviewed the above documentation for accuracy and completeness, and I agree with the above. Abby Potash, PA-C ? ?

## 2022-01-20 DIAGNOSIS — F4010 Social phobia, unspecified: Secondary | ICD-10-CM | POA: Diagnosis not present

## 2022-01-20 DIAGNOSIS — F341 Dysthymic disorder: Secondary | ICD-10-CM | POA: Diagnosis not present

## 2022-01-24 ENCOUNTER — Encounter (INDEPENDENT_AMBULATORY_CARE_PROVIDER_SITE_OTHER): Payer: Self-pay | Admitting: Physician Assistant

## 2022-01-24 NOTE — Telephone Encounter (Signed)
Last OV with Linus Orn ?Please advise

## 2022-01-26 DIAGNOSIS — M4316 Spondylolisthesis, lumbar region: Secondary | ICD-10-CM | POA: Diagnosis not present

## 2022-01-26 DIAGNOSIS — M5416 Radiculopathy, lumbar region: Secondary | ICD-10-CM | POA: Diagnosis not present

## 2022-02-03 DIAGNOSIS — M7918 Myalgia, other site: Secondary | ICD-10-CM | POA: Insufficient documentation

## 2022-02-03 DIAGNOSIS — F4010 Social phobia, unspecified: Secondary | ICD-10-CM | POA: Diagnosis not present

## 2022-02-03 DIAGNOSIS — F341 Dysthymic disorder: Secondary | ICD-10-CM | POA: Diagnosis not present

## 2022-02-08 DIAGNOSIS — M5136 Other intervertebral disc degeneration, lumbar region: Secondary | ICD-10-CM | POA: Diagnosis not present

## 2022-02-08 DIAGNOSIS — M961 Postlaminectomy syndrome, not elsewhere classified: Secondary | ICD-10-CM | POA: Diagnosis not present

## 2022-02-08 DIAGNOSIS — Z79891 Long term (current) use of opiate analgesic: Secondary | ICD-10-CM | POA: Diagnosis not present

## 2022-02-17 DIAGNOSIS — F4010 Social phobia, unspecified: Secondary | ICD-10-CM | POA: Diagnosis not present

## 2022-02-17 DIAGNOSIS — F341 Dysthymic disorder: Secondary | ICD-10-CM | POA: Diagnosis not present

## 2022-02-22 ENCOUNTER — Ambulatory Visit (INDEPENDENT_AMBULATORY_CARE_PROVIDER_SITE_OTHER): Payer: BC Managed Care – PPO | Admitting: Adult Health

## 2022-02-22 ENCOUNTER — Encounter (INDEPENDENT_AMBULATORY_CARE_PROVIDER_SITE_OTHER): Payer: Self-pay | Admitting: Adult Health

## 2022-02-22 VITALS — BP 120/69 | HR 73 | Temp 97.7°F | Ht 63.0 in | Wt 253.0 lb

## 2022-02-22 DIAGNOSIS — E669 Obesity, unspecified: Secondary | ICD-10-CM

## 2022-02-22 DIAGNOSIS — E1169 Type 2 diabetes mellitus with other specified complication: Secondary | ICD-10-CM

## 2022-02-22 DIAGNOSIS — Z6841 Body Mass Index (BMI) 40.0 and over, adult: Secondary | ICD-10-CM | POA: Diagnosis not present

## 2022-02-22 DIAGNOSIS — Z7985 Long-term (current) use of injectable non-insulin antidiabetic drugs: Secondary | ICD-10-CM

## 2022-02-22 DIAGNOSIS — E785 Hyperlipidemia, unspecified: Secondary | ICD-10-CM | POA: Diagnosis not present

## 2022-02-23 ENCOUNTER — Encounter (INDEPENDENT_AMBULATORY_CARE_PROVIDER_SITE_OTHER): Payer: Self-pay | Admitting: Adult Health

## 2022-02-23 ENCOUNTER — Other Ambulatory Visit (INDEPENDENT_AMBULATORY_CARE_PROVIDER_SITE_OTHER): Payer: Self-pay | Admitting: Adult Health

## 2022-02-23 MED ORDER — SEMAGLUTIDE (1 MG/DOSE) 4 MG/3ML ~~LOC~~ SOPN: 1.0000 mg | PEN_INJECTOR | SUBCUTANEOUS | 0 refills | Status: DC

## 2022-02-23 NOTE — Telephone Encounter (Signed)
Please advise 

## 2022-02-24 NOTE — Progress Notes (Signed)
Chief Complaint:   OBESITY Lisa Curry is here to discuss her progress with her obesity treatment plan along with follow-up of her obesity related diagnoses. Lisa Curry is on keeping a food journal and adhering to recommended goals of 1300-1500 calories and 95 protein and states she is following her eating plan approximately 25% of the time. Lisa Curry states she is doing cardio and weights 60-90 minutes 2 times per week.  Today's visit was #: 44 Starting weight: 279 lbs Starting date: 07/26/2018 Today's weight: 253 lbs Today's date: 02/22/2022 Total lbs lost to date: 26 lbs Total lbs lost since last in-office visit: 0  Interim History: Lisa Curry estimates to track intake 30% of the time.  She has continued regular exercise- cardiovascular training and weight training for 60-90 mins at least twice weekly.  Subjective:   1. Type 2 diabetes mellitus with hyperlipidemia ([place]) Gracey's fasting blood glucose is about 90-120.   She denies sx's of hypoglycemia. She has been intolerant to Metformin, SE: significant diarrhea.   She is currently on Victoza 1.8 mg daily.   She denies mass in neck, dysphagia, dyspepsia, persistent hoarseness, abd pain, or N/V/Constipation. Insurance will not cover [employer].    Assessment/Plan:   1. Type 2 diabetes mellitus with hyperlipidemia ([place]) Lisa Curry has agreed to continue Victoza 1.8 mg daily.   Lisa Curry is to contact her insurance provider and inquire of the cost/coverage of Ozempic, then contact [facility] with the information.   2. Obesity, with current BMI 44.8 Handouts given:  Recipes Guide II  Lisa Curry is currently in the action stage of change. As such, her goal is to continue with weight loss efforts. She has agreed to keeping a food journal and adhering to recommended goals of 1300-1500 calories and 95 protein daily.   Exercise goals:  As is.   Behavioral modification strategies: increasing lean protein intake, decreasing simple carbohydrates, meal planning and cooking  strategies, keeping healthy foods in the home, ways to avoid night time snacking, better snacking choices, planning for success, and keeping a strict food journal.  Lisa Curry has agreed to follow-up with our clinic in 4 weeks. She was informed of the importance of frequent follow-up visits to maximize her success with intensive lifestyle modifications for her multiple health conditions.   Objective:   Blood pressure 120/69, pulse 73, temperature 97.7 F (36.5 C), height '5\' 3"'$  (1.6 m), weight 253 lb (114.8 kg), SpO2 99 %. Body mass index is 44.82 kg/m.  General: Cooperative, alert, well developed, in no acute distress. HEENT: Conjunctivae and lids unremarkable. Cardiovascular: Regular rhythm.  Lungs: Normal work of breathing. Neurologic: No focal deficits.   Lab Results  Component Value Date   CREATININE 0.81 09/17/2021   BUN 9 09/17/2021   NA 141 09/17/2021   K 4.0 09/17/2021   CL 103 09/17/2021   CO2 30 09/17/2021   Lab Results  Component Value Date   ALT 29 09/17/2021   AST 27 09/17/2021   ALKPHOS 115 09/17/2021   BILITOT 0.6 09/17/2021   Lab Results  Component Value Date   HGBA1C 5.7 09/17/2021   HGBA1C 5.4 06/01/2021   HGBA1C 5.5 12/30/2020   HGBA1C 6.0 (H) 06/16/2020   HGBA1C 6.2 (H) 12/10/2019   Lab Results  Component Value Date   INSULIN 5.4 06/01/2021   INSULIN 12.0 06/16/2020   INSULIN 9.4 12/10/2019   INSULIN 9.8 11/28/2018   INSULIN 20.0 07/26/2018   Lab Results  Component Value Date   TSH 2.330 12/10/2019   Lab Results  Component Value Date   CHOL 160 09/17/2021   HDL 52.00 09/17/2021   LDLCALC 77 09/17/2021   LDLDIRECT 73.0 04/18/2017   TRIG 154.0 (H) 09/17/2021   CHOLHDL 3 09/17/2021   Lab Results  Component Value Date   VD25OH 46.89 09/17/2021   VD25OH 60.0 06/01/2021   VD25OH 51.4 06/16/2020   Lab Results  Component Value Date   WBC 8.1 12/30/2020   HGB 12.8 12/30/2020   HCT 40.4 12/30/2020   MCV 95.7 12/30/2020   PLT 272 12/30/2020    Lab Results  Component Value Date   IRON 62 05/30/2016   FERRITIN 55.2 05/30/2016    Attestation Statements:   Reviewed by clinician on day of visit: allergies, medications, problem list, medical history, surgical history, family history, social history, and previous encounter notes.  I, Davy Pique, RMA, am acting as Location manager for [name] Marble, NP.  I have reviewed the above documentation for accuracy and completeness, and I agree with the above. -  Solly Derasmo d. Fredie Majano, NP-C

## 2022-02-24 NOTE — Telephone Encounter (Signed)
Update - Prior authorization started for Ozempic. Last office note entered in chart and I have attached to prior authorization, per request of insurance. Will notify patient and provider once response is received by provider.

## 2022-02-24 NOTE — Telephone Encounter (Signed)
Prior authorization is on hold for Ozempic until office note from 02/22/22 is available. Insurance is requesting office note to be sent with prior authorization. I will reach out to the scribes to see if they can assist with getting office note entered. Will notify patient and provider once response is received. Thanks!

## 2022-02-24 NOTE — Telephone Encounter (Signed)
PA for Ozempic required.

## 2022-02-28 ENCOUNTER — Encounter (INDEPENDENT_AMBULATORY_CARE_PROVIDER_SITE_OTHER): Payer: Self-pay

## 2022-02-28 ENCOUNTER — Telehealth (INDEPENDENT_AMBULATORY_CARE_PROVIDER_SITE_OTHER): Payer: Self-pay | Admitting: Adult Health

## 2022-02-28 NOTE — Telephone Encounter (Signed)
Lisa Curry - Prior authorization approved for [employer]. Effective: 02/24/2022 - 02/24/2023. Patient sent approval message via mychart.

## 2022-03-02 DIAGNOSIS — M5416 Radiculopathy, lumbar region: Secondary | ICD-10-CM | POA: Diagnosis not present

## 2022-03-03 DIAGNOSIS — F341 Dysthymic disorder: Secondary | ICD-10-CM | POA: Diagnosis not present

## 2022-03-03 DIAGNOSIS — F4010 Social phobia, unspecified: Secondary | ICD-10-CM | POA: Diagnosis not present

## 2022-03-08 ENCOUNTER — Other Ambulatory Visit (INDEPENDENT_AMBULATORY_CARE_PROVIDER_SITE_OTHER): Payer: Self-pay | Admitting: Physician Assistant

## 2022-03-08 DIAGNOSIS — F5105 Insomnia due to other mental disorder: Secondary | ICD-10-CM | POA: Diagnosis not present

## 2022-03-08 DIAGNOSIS — F411 Generalized anxiety disorder: Secondary | ICD-10-CM | POA: Diagnosis not present

## 2022-03-08 DIAGNOSIS — F39 Unspecified mood [affective] disorder: Secondary | ICD-10-CM | POA: Diagnosis not present

## 2022-03-08 DIAGNOSIS — E1169 Type 2 diabetes mellitus with other specified complication: Secondary | ICD-10-CM

## 2022-03-16 ENCOUNTER — Ambulatory Visit (INDEPENDENT_AMBULATORY_CARE_PROVIDER_SITE_OTHER): Payer: BC Managed Care – PPO

## 2022-03-16 ENCOUNTER — Ambulatory Visit: Payer: BC Managed Care – PPO

## 2022-03-16 DIAGNOSIS — Z23 Encounter for immunization: Secondary | ICD-10-CM

## 2022-03-16 NOTE — Progress Notes (Signed)
Per orders of Dr. Ria Bush, injection of Shingrix given by [facility] in left deltoid. Patient tolerated injection well.

## 2022-03-17 DIAGNOSIS — F4010 Social phobia, unspecified: Secondary | ICD-10-CM | POA: Diagnosis not present

## 2022-03-17 DIAGNOSIS — F341 Dysthymic disorder: Secondary | ICD-10-CM | POA: Diagnosis not present

## 2022-03-21 DIAGNOSIS — M5416 Radiculopathy, lumbar region: Secondary | ICD-10-CM | POA: Diagnosis not present

## 2022-03-21 DIAGNOSIS — M4316 Spondylolisthesis, lumbar region: Secondary | ICD-10-CM | POA: Diagnosis not present

## 2022-04-05 DIAGNOSIS — F4010 Social phobia, unspecified: Secondary | ICD-10-CM | POA: Diagnosis not present

## 2022-04-05 DIAGNOSIS — F341 Dysthymic disorder: Secondary | ICD-10-CM | POA: Diagnosis not present

## 2022-04-14 ENCOUNTER — Encounter (INDEPENDENT_AMBULATORY_CARE_PROVIDER_SITE_OTHER): Payer: Self-pay | Admitting: Adult Health

## 2022-04-14 ENCOUNTER — Ambulatory Visit (INDEPENDENT_AMBULATORY_CARE_PROVIDER_SITE_OTHER): Payer: BC Managed Care – PPO | Admitting: Adult Health

## 2022-04-14 VITALS — BP 109/64 | HR 74 | Temp 97.9°F | Ht 63.0 in | Wt 248.0 lb

## 2022-04-14 DIAGNOSIS — E1169 Type 2 diabetes mellitus with other specified complication: Secondary | ICD-10-CM | POA: Diagnosis not present

## 2022-04-14 DIAGNOSIS — E669 Obesity, unspecified: Secondary | ICD-10-CM

## 2022-04-14 DIAGNOSIS — Z6841 Body Mass Index (BMI) 40.0 and over, adult: Secondary | ICD-10-CM

## 2022-04-14 DIAGNOSIS — Z9189 Other specified personal risk factors, not elsewhere classified: Secondary | ICD-10-CM

## 2022-04-14 DIAGNOSIS — E559 Vitamin D deficiency, unspecified: Secondary | ICD-10-CM

## 2022-04-14 DIAGNOSIS — Z7985 Long-term (current) use of injectable non-insulin antidiabetic drugs: Secondary | ICD-10-CM

## 2022-04-15 ENCOUNTER — Encounter (INDEPENDENT_AMBULATORY_CARE_PROVIDER_SITE_OTHER): Payer: Self-pay | Admitting: Adult Health

## 2022-04-15 ENCOUNTER — Telehealth: Payer: Self-pay | Admitting: Family Medicine

## 2022-04-15 ENCOUNTER — Other Ambulatory Visit (INDEPENDENT_AMBULATORY_CARE_PROVIDER_SITE_OTHER): Payer: Self-pay | Admitting: Adult Health

## 2022-04-15 NOTE — Telephone Encounter (Signed)
Patient called about Ozempic and she states that she contacted [facility] and didn't get an answer. Call back number [phone]. She is out of medication and suppose to take her first shot tomorrow.

## 2022-04-15 NOTE — Telephone Encounter (Signed)
This needs to go through [facility].  Mychart message sent to patient.

## 2022-04-15 NOTE — Telephone Encounter (Signed)
Spoke with pt asking about refill request.  States she had wt mgmt appt yesterday and mentioned she needed refill.  Says they discussed increasing to 2 mg wkly.  After not being contacted by [facility] yesterday, pt called them and was told they did not get rx plus the 2 mg is on backorder. She waited until today to contact wt mgmt but they are closed today.  So pt is asking if Dr. Darnell Level will send rx since she does inj on Saturday.  I suggested in the future, pt contact the pharmacy for a refill the day of or day after she takes last dose. Plz advise.

## 2022-04-18 ENCOUNTER — Encounter (INDEPENDENT_AMBULATORY_CARE_PROVIDER_SITE_OTHER): Payer: Self-pay | Admitting: Adult Health

## 2022-04-18 MED ORDER — SEMAGLUTIDE (1 MG/DOSE) 4 MG/3ML ~~LOC~~ SOPN: 1.0000 mg | PEN_INJECTOR | SUBCUTANEOUS | 0 refills | Status: DC

## 2022-04-20 NOTE — Progress Notes (Signed)
Chief Complaint:   OBESITY Lisa Curry is here to discuss her progress with her obesity treatment plan along with follow-up of her obesity related diagnoses. Lisa Curry is on keeping a food journal and adhering to recommended goals of 1300-1500 calories and 95 grams of protein daily and states she is following her eating plan approximately 99% of the time. Lisa Curry states she is walking and lifting weights for 60-90 minutes 2 times per week.  Today's visit was #: 32 Starting weight: 279 lbs Starting date: 07/26/2018 Today's weight: 248 lbs Today's date: 04/14/2022 Total lbs lost to date: 31 Total lbs lost since last in-office visit: 5  Interim History:  On 02/22/2022, Victoza 1.8 mg was replaced with Ozempic 1 mg.  Ozempic 1 mg -4 doses at this strength and she denies medication SE.   Subjective:   1. Type 2 diabetes mellitus with other specified complication, without long-term current use of insulin (HCC) Fasting blood glucose ranges between 105-149, mostly in the 120's.  On 02/22/2022, Victoza 1.8 mg was replaced with Ozempic 1 mg.  Ozempic 1 mg -4 doses at this strength. She denies mass in neck, dysphagia, dyspepsia, persistent hoarseness, abd pain, or N/V/Constipation.  2. Vitamin D deficiency Lisa Curry is on OTC Vitamin D3 5,000 IU once daily.   3. At risk for nausea Lisa Curry is at risk for nausea due to increase in Ozempic for type 2 diabetes mellitus.  Assessment/Plan:   1. Type 2 diabetes mellitus with other specified complication, without long-term current use of insulin ([place]) Lisa Curry agreed to increase Ozempic to 2 mg once weekly, and we will refill for 1 month.  - Semaglutide, 1 MG/DOSE, 4 MG/3ML SOPN; Inject 1 mg as directed once a week.  Dispense: 3 mL; Refill: 0  2. Vitamin D deficiency We will recheck labs at [facility] next office visit.  3. At risk for nausea Lisa Curry was given approximately 15 minutes of nausea prevention counseling today. Lisa Curry is at risk for nausea due to her new or  current medication. She was encouraged to titrate her medication slowly, make sure to stay hydrated, eat smaller portions throughout the day, and avoid high fat meals.   4. Obesity, with current BMI 44.0 Lisa Curry is currently in the action stage of change. As such, her goal is to continue with weight loss efforts. She has agreed to keeping a food journal and adhering to recommended goals of 1300-1500 calories and 95 grams of protein daily.   We will recheck fasting labs at her next office visit.  Exercise goals: As is.   Behavioral modification strategies: increasing lean protein intake, decreasing simple carbohydrates, meal planning and cooking strategies, keeping healthy foods in the home, and planning for success.  Lisa Curry has agreed to follow-up with our clinic in 4 weeks. She was informed of the importance of frequent follow-up visits to maximize her success with intensive lifestyle modifications for her multiple health conditions.   Objective:   Blood pressure 109/64, pulse 74, temperature 97.9 F (36.6 C), height '5\' 3"'$  (1.6 m), weight 248 lb (112.5 kg), SpO2 97 %. Body mass index is 43.93 kg/m.  General: Cooperative, alert, well developed, in no acute distress. HEENT: Conjunctivae and lids unremarkable. Cardiovascular: Regular rhythm.  Lungs: Normal work of breathing. Neurologic: No focal deficits.   Lab Results  Component Value Date   CREATININE 0.81 09/17/2021   BUN 9 09/17/2021   NA 141 09/17/2021   K 4.0 09/17/2021   CL 103 09/17/2021   CO2  30 09/17/2021   Lab Results  Component Value Date   ALT 29 09/17/2021   AST 27 09/17/2021   ALKPHOS 115 09/17/2021   BILITOT 0.6 09/17/2021   Lab Results  Component Value Date   HGBA1C 5.7 09/17/2021   HGBA1C 5.4 06/01/2021   HGBA1C 5.5 12/30/2020   HGBA1C 6.0 (H) 06/16/2020   HGBA1C 6.2 (H) 12/10/2019   Lab Results  Component Value Date   INSULIN 5.4 06/01/2021   INSULIN 12.0 06/16/2020   INSULIN 9.4 12/10/2019   INSULIN  9.8 11/28/2018   INSULIN 20.0 07/26/2018   Lab Results  Component Value Date   TSH 2.330 12/10/2019   Lab Results  Component Value Date   CHOL 160 09/17/2021   HDL 52.00 09/17/2021   LDLCALC 77 09/17/2021   LDLDIRECT 73.0 04/18/2017   TRIG 154.0 (H) 09/17/2021   CHOLHDL 3 09/17/2021   Lab Results  Component Value Date   VD25OH 46.89 09/17/2021   VD25OH 60.0 06/01/2021   VD25OH 51.4 06/16/2020   Lab Results  Component Value Date   WBC 8.1 12/30/2020   HGB 12.8 12/30/2020   HCT 40.4 12/30/2020   MCV 95.7 12/30/2020   PLT 272 12/30/2020   Lab Results  Component Value Date   IRON 62 05/30/2016   FERRITIN 55.2 05/30/2016   Attestation Statements:   Reviewed by clinician on day of visit: allergies, medications, problem list, medical history, surgical history, family history, social history, and previous encounter notes.   Wilhemena Durie, am acting as transcriptionist for [name] Marble, NP.  I have reviewed the above documentation for accuracy and completeness, and I agree with the above. -  Shelley Cocke d. Molli Gethers, NP-C

## 2022-04-27 ENCOUNTER — Encounter (INDEPENDENT_AMBULATORY_CARE_PROVIDER_SITE_OTHER): Payer: Self-pay

## 2022-04-28 DIAGNOSIS — F341 Dysthymic disorder: Secondary | ICD-10-CM | POA: Diagnosis not present

## 2022-04-28 DIAGNOSIS — F4010 Social phobia, unspecified: Secondary | ICD-10-CM | POA: Diagnosis not present

## 2022-05-10 DIAGNOSIS — F341 Dysthymic disorder: Secondary | ICD-10-CM | POA: Diagnosis not present

## 2022-05-10 DIAGNOSIS — F4010 Social phobia, unspecified: Secondary | ICD-10-CM | POA: Diagnosis not present

## 2022-05-11 ENCOUNTER — Ambulatory Visit (INDEPENDENT_AMBULATORY_CARE_PROVIDER_SITE_OTHER): Payer: BC Managed Care – PPO | Admitting: Adult Health

## 2022-05-11 ENCOUNTER — Encounter (INDEPENDENT_AMBULATORY_CARE_PROVIDER_SITE_OTHER): Payer: Self-pay | Admitting: Adult Health

## 2022-05-11 VITALS — BP 110/65 | HR 71 | Temp 97.8°F | Ht 63.0 in | Wt 251.0 lb

## 2022-05-11 DIAGNOSIS — Z7985 Long-term (current) use of injectable non-insulin antidiabetic drugs: Secondary | ICD-10-CM

## 2022-05-11 DIAGNOSIS — E1169 Type 2 diabetes mellitus with other specified complication: Secondary | ICD-10-CM | POA: Diagnosis not present

## 2022-05-11 DIAGNOSIS — G8929 Other chronic pain: Secondary | ICD-10-CM

## 2022-05-11 DIAGNOSIS — M5442 Lumbago with sciatica, left side: Secondary | ICD-10-CM | POA: Diagnosis not present

## 2022-05-11 DIAGNOSIS — E669 Obesity, unspecified: Secondary | ICD-10-CM

## 2022-05-11 DIAGNOSIS — Z6841 Body Mass Index (BMI) 40.0 and over, adult: Secondary | ICD-10-CM

## 2022-05-11 MED ORDER — SEMAGLUTIDE (1 MG/DOSE) 4 MG/3ML ~~LOC~~ SOPN: 1.0000 mg | PEN_INJECTOR | SUBCUTANEOUS | 1 refills | Status: DC

## 2022-05-14 NOTE — Progress Notes (Unsigned)
Chief Complaint:   OBESITY Lisa Curry is here to discuss her progress with her obesity treatment plan along with follow-up of her obesity related diagnoses. Lisa Curry is on keeping a food journal and adhering to recommended goals of 1300-1500 calories and 95 protein and states she is following her eating plan approximately 60% of the time. Lisa Curry states she is walking treadmill and lifting weights 30 minutes 2 times per week.  Today's visit was #: 53 Starting weight: 279 lbs Starting date: 07/26/2018 Today's weight: 251 lbs Today's date: 05/11/2022 Total lbs lost to date: 31 lbs Total lbs lost since last in-office visit: +3  Interim History: August 7, started Lyrica 75 mg BID to treat back pain with left side sciatica.  Pain prior to Lyrica therapy 07/10, pain now, 3/10  Subjective:   1. Type 2 diabetes mellitus with other specified complication, without long-term current use of insulin (HCC) She is currently on Ozempic 1 mg, but unable to find the Ozempic 2 mg strength due to [employer].  Fasting blood glucose <140.  2. Chronic midline low back pain with left-sided sciatica *** Increased carbohydrate cravings.   Assessment/Plan:   1. Type 2 diabetes mellitus with other specified complication, without long-term current use of insulin (HCC) Refill- Semaglutide, 1 MG/DOSE, 4 MG/3ML SOPN; Inject 1 mg as directed once a week.  Dispense: 3 mL; Refill: 1  2. Chronic midline low back pain with left-sided sciatica Follow up with pain specialist as directed. - pregabalin (LYRICA) 75 MG capsule; Take 75 mg by mouth 2 (two) times daily.  3. Obesity, with current BMI 44.6 Fasting labs at next office visit.   Lisa Curry is currently in the action stage of change. As such, her goal is to continue with weight loss efforts. She has agreed to keeping a food journal and adhering to recommended goals of 1300-1500 calories and 95 protein.   Exercise goals:  As is.   Behavioral modification  strategies: increasing lean protein intake, decreasing simple carbohydrates, meal planning and cooking strategies, keeping healthy foods in the home, and planning for success.  Lisa Curry has agreed to follow-up with our clinic in 4 weeks Fasting. She was informed of the importance of frequent follow-up visits to maximize her success with intensive lifestyle modifications for her multiple health conditions.   Objective:   Blood pressure 110/65, pulse 71, temperature 97.8 F (36.6 C), height '5\' 3"'$  (1.6 m), weight 251 lb (113.9 kg), SpO2 99 %. Body mass index is 44.46 kg/m.  General: Cooperative, alert, well developed, in no acute distress. HEENT: Conjunctivae and lids unremarkable. Cardiovascular: Regular rhythm.  Lungs: Normal work of breathing. Neurologic: No focal deficits.   Lab Results  Component Value Date   CREATININE 0.81 09/17/2021   BUN 9 09/17/2021   NA 141 09/17/2021   K 4.0 09/17/2021   CL 103 09/17/2021   CO2 30 09/17/2021   Lab Results  Component Value Date   ALT 29 09/17/2021   AST 27 09/17/2021   ALKPHOS 115 09/17/2021   BILITOT 0.6 09/17/2021   Lab Results  Component Value Date   HGBA1C 5.7 09/17/2021   HGBA1C 5.4 06/01/2021   HGBA1C 5.5 12/30/2020   HGBA1C 6.0 (H) 06/16/2020   HGBA1C 6.2 (H) 12/10/2019   Lab Results  Component Value Date   INSULIN 5.4 06/01/2021   INSULIN 12.0 06/16/2020   INSULIN 9.4 12/10/2019   INSULIN 9.8 11/28/2018   INSULIN 20.0 07/26/2018   Lab Results  Component Value Date  TSH 2.330 12/10/2019   Lab Results  Component Value Date   CHOL 160 09/17/2021   HDL 52.00 09/17/2021   LDLCALC 77 09/17/2021   LDLDIRECT 73.0 04/18/2017   TRIG 154.0 (H) 09/17/2021   CHOLHDL 3 09/17/2021   Lab Results  Component Value Date   VD25OH 46.89 09/17/2021   VD25OH 60.0 06/01/2021   VD25OH 51.4 06/16/2020   Lab Results  Component Value Date   WBC 8.1 12/30/2020   HGB 12.8 12/30/2020   HCT 40.4 12/30/2020   MCV 95.7 12/30/2020    PLT 272 12/30/2020   Lab Results  Component Value Date   IRON 62 05/30/2016   FERRITIN 55.2 05/30/2016   Attestation Statements:   Reviewed by clinician on day of visit: allergies, medications, problem list, medical history, surgical history, family history, social history, and previous encounter notes.  I, Davy Pique, RMA, am acting as Location manager for [name] Marble, NP.  I have reviewed the above documentation for accuracy and completeness, and I agree with the above. -  ***

## 2022-05-16 ENCOUNTER — Ambulatory Visit (INDEPENDENT_AMBULATORY_CARE_PROVIDER_SITE_OTHER): Payer: BC Managed Care – PPO | Admitting: Adult Health

## 2022-06-08 DIAGNOSIS — M5416 Radiculopathy, lumbar region: Secondary | ICD-10-CM | POA: Diagnosis not present

## 2022-06-08 DIAGNOSIS — M4316 Spondylolisthesis, lumbar region: Secondary | ICD-10-CM | POA: Diagnosis not present

## 2022-06-09 DIAGNOSIS — F4010 Social phobia, unspecified: Secondary | ICD-10-CM | POA: Diagnosis not present

## 2022-06-09 DIAGNOSIS — F5105 Insomnia due to other mental disorder: Secondary | ICD-10-CM | POA: Diagnosis not present

## 2022-06-09 DIAGNOSIS — F411 Generalized anxiety disorder: Secondary | ICD-10-CM | POA: Diagnosis not present

## 2022-06-09 DIAGNOSIS — F341 Dysthymic disorder: Secondary | ICD-10-CM | POA: Diagnosis not present

## 2022-06-09 DIAGNOSIS — F39 Unspecified mood [affective] disorder: Secondary | ICD-10-CM | POA: Diagnosis not present

## 2022-06-10 ENCOUNTER — Other Ambulatory Visit: Payer: Self-pay | Admitting: Orthopaedic Surgery

## 2022-06-10 DIAGNOSIS — M4316 Spondylolisthesis, lumbar region: Secondary | ICD-10-CM

## 2022-06-14 ENCOUNTER — Ambulatory Visit (INDEPENDENT_AMBULATORY_CARE_PROVIDER_SITE_OTHER): Payer: BC Managed Care – PPO | Admitting: Adult Health

## 2022-06-23 ENCOUNTER — Ambulatory Visit
Admission: RE | Admit: 2022-06-23 | Discharge: 2022-06-23 | Disposition: A | Discharge status: Home / Self Care | Payer: BC Managed Care – PPO | Source: Ambulatory Visit | Attending: Orthopaedic Surgery | Admitting: Orthopaedic Surgery

## 2022-06-23 DIAGNOSIS — M4807 Spinal stenosis, lumbosacral region: Secondary | ICD-10-CM | POA: Diagnosis not present

## 2022-06-23 DIAGNOSIS — M5137 Other intervertebral disc degeneration, lumbosacral region: Secondary | ICD-10-CM | POA: Diagnosis not present

## 2022-06-23 DIAGNOSIS — M4316 Spondylolisthesis, lumbar region: Secondary | ICD-10-CM

## 2022-06-23 DIAGNOSIS — M47816 Spondylosis without myelopathy or radiculopathy, lumbar region: Secondary | ICD-10-CM | POA: Diagnosis not present

## 2022-06-23 MED ORDER — GADOPICLENOL 0.5 MMOL/ML IV SOLN
10.0000 mL | Freq: Once | INTRAVENOUS | Status: AC | PRN
  Administered 2022-06-23: 10 mL via INTRAVENOUS

## 2022-06-24 DIAGNOSIS — F4010 Social phobia, unspecified: Secondary | ICD-10-CM | POA: Diagnosis not present

## 2022-06-24 DIAGNOSIS — F341 Dysthymic disorder: Secondary | ICD-10-CM | POA: Diagnosis not present

## 2022-06-27 ENCOUNTER — Other Ambulatory Visit (INDEPENDENT_AMBULATORY_CARE_PROVIDER_SITE_OTHER): Payer: Self-pay | Admitting: Adult Health

## 2022-06-27 ENCOUNTER — Encounter (INDEPENDENT_AMBULATORY_CARE_PROVIDER_SITE_OTHER): Payer: Self-pay | Admitting: Adult Health

## 2022-06-27 DIAGNOSIS — E1169 Type 2 diabetes mellitus with other specified complication: Secondary | ICD-10-CM

## 2022-06-27 MED ORDER — SEMAGLUTIDE (1 MG/DOSE) 4 MG/3ML ~~LOC~~ SOPN: 1.0000 mg | PEN_INJECTOR | SUBCUTANEOUS | 1 refills | Status: DC

## 2022-06-28 ENCOUNTER — Ambulatory Visit (INDEPENDENT_AMBULATORY_CARE_PROVIDER_SITE_OTHER): Payer: BC Managed Care – PPO | Admitting: Adult Health

## 2022-06-30 ENCOUNTER — Telehealth: Payer: Self-pay | Admitting: Family Medicine

## 2022-06-30 DIAGNOSIS — M532X6 Spinal instabilities, lumbar region: Secondary | ICD-10-CM | POA: Diagnosis not present

## 2022-06-30 DIAGNOSIS — M48062 Spinal stenosis, lumbar region with neurogenic claudication: Secondary | ICD-10-CM | POA: Diagnosis not present

## 2022-06-30 NOTE — Telephone Encounter (Signed)
Pt called stating she needs a surgical clearance forms completed by Dr. Darnell Level. Spoke to [place] she said pt would have to be seen for a pre surgery visit, schedule ov for 07/11/22. Pt wants to know will there be labs done during visit & will the labs require fasting?

## 2022-06-30 NOTE — Telephone Encounter (Signed)
Spoke with pt notifying her we have not received a surgical clearance form.  Pt states she has the form and is needing spinal fusion surgery, date TBD.  Pt will bring form to 07/08/22 pre-op OV.  I explained there will more than likely be non-fasting labs, EKG, CXR and UA done at this visit.  Pt verbalizes understanding.

## 2022-07-01 ENCOUNTER — Encounter: Payer: Self-pay | Admitting: Family Medicine

## 2022-07-01 NOTE — Telephone Encounter (Signed)
Printed pic of pre-op form pt uploaded in [place].  Placed in Dr. Synthia Innocent box.  (See 06/30/22 phn note.)

## 2022-07-04 ENCOUNTER — Encounter: Payer: Self-pay | Admitting: Family Medicine

## 2022-07-04 ENCOUNTER — Ambulatory Visit (INDEPENDENT_AMBULATORY_CARE_PROVIDER_SITE_OTHER): Payer: BC Managed Care – PPO | Admitting: Family Medicine

## 2022-07-06 ENCOUNTER — Ambulatory Visit (INDEPENDENT_AMBULATORY_CARE_PROVIDER_SITE_OTHER): Payer: BC Managed Care – PPO | Admitting: Family Medicine

## 2022-07-06 ENCOUNTER — Encounter (INDEPENDENT_AMBULATORY_CARE_PROVIDER_SITE_OTHER): Payer: Self-pay | Admitting: Family Medicine

## 2022-07-06 VITALS — BP 124/71 | HR 74 | Temp 97.6°F | Ht 63.0 in | Wt 251.0 lb

## 2022-07-06 DIAGNOSIS — Z7985 Long-term (current) use of injectable non-insulin antidiabetic drugs: Secondary | ICD-10-CM

## 2022-07-06 DIAGNOSIS — E559 Vitamin D deficiency, unspecified: Secondary | ICD-10-CM

## 2022-07-06 DIAGNOSIS — E782 Mixed hyperlipidemia: Secondary | ICD-10-CM | POA: Diagnosis not present

## 2022-07-06 DIAGNOSIS — E1169 Type 2 diabetes mellitus with other specified complication: Secondary | ICD-10-CM

## 2022-07-06 DIAGNOSIS — M545 Low back pain, unspecified: Secondary | ICD-10-CM | POA: Diagnosis not present

## 2022-07-06 DIAGNOSIS — Z6841 Body Mass Index (BMI) 40.0 and over, adult: Secondary | ICD-10-CM

## 2022-07-06 DIAGNOSIS — E669 Obesity, unspecified: Secondary | ICD-10-CM

## 2022-07-06 DIAGNOSIS — E66813 Obesity, class 3: Secondary | ICD-10-CM | POA: Insufficient documentation

## 2022-07-06 MED ORDER — SEMAGLUTIDE (1 MG/DOSE) 4 MG/3ML ~~LOC~~ SOPN: 1.0000 mg | PEN_INJECTOR | SUBCUTANEOUS | 1 refills | Status: DC

## 2022-07-06 MED ORDER — SEMAGLUTIDE (1 MG/DOSE) 4 MG/3ML ~~LOC~~ SOPN: 1.0000 mg | PEN_INJECTOR | SUBCUTANEOUS | 0 refills | Status: DC

## 2022-07-06 NOTE — Progress Notes (Signed)
Office: [phone]  /  Fax: [phone]   Total lbs lost to date: 31 Total lbs lost since last in-office visit: 0     BP 124/71   Pulse 74   Temp 97.6 F (36.4 C)   Ht '5\' 3"'$  (1.6 m)   Wt 251 lb (113.9 kg)   SpO2 97%   BMI 44.46 kg/m  She was weighed on the bioimpedance scale:  Body mass index is 44.46 kg/m.  General:  Alert, oriented and cooperative. Patient is in no acute distress.  Mental Status: Normal mood and affect. Normal behavior. Normal judgment and thought content.        Patient past medical history includes:   Past Medical History:  Diagnosis Date   Allergy    Anemia    Anxiety    Barrett esophagus    Don Bulla, have not received EGD report   Chronic back pain    Constipation    Depression with anxiety    Diabetes mellitus without complication ([place])    DSME 04/2014   Edema, lower extremity    Fatty liver    Gastric polyp 05/2012   h/o EGD   GERD (gastroesophageal reflux disease)    History of Helicobacter pylori infection    HLD (hyperlipidemia)    Hyperlipidemia    Sleep apnea    uses CPAP   Vitamin D deficiency    History of Present Illness The patient presents with a history of obesity, diabetes, and chronic back problems. They report a recent exacerbation of their back pain during a weekend, which was so severe that they were unable to get out of bed and had to crawl on the floor. The patient has experienced back issues since August of the previous year, with flare-ups occurring intermittently. They have undergone prednisone treatment and received two sets of injections for their back pain. The second set of injections provided relief for their lower back pain but did not alleviate the pain radiating down their leg, which is suggestive of sciatica.  The patient was previously prescribed Lyrica (100 mg) for their back pain, but they stopped taking it about a week and a half ago on the advice of their doctor. They mention that their back pain is  caused by a bulging disc, which is pinching a nerve and causing pain in their leg. The patient is considering surgery to clean out the arthritis and possibly fuse three or four vertebrae, depending on the surgeon's assessment during the procedure. They hope to have the surgery completed by December 31st.  In addition to their back pain, the patient has a history of hernia repair in April of the previous year. They have not had lab work done in almost a year and are due for a check-up to assess their blood sugar levels, cholesterol, vitamin D, kidney function, electrolytes, and liver function. The patient is currently taking Ozempic for their diabetes and is in need of a refill. They plan to schedule a follow-up appointment in January to discuss their progress and any necessary adjustments to their treatment plan.  Assessment & Plan Chronic Back Pain with Sciatica: Patient has a history of chronic back pain with sciatica, which has been worsening. Recent imaging shows disc bulging and nerve impingement. - Patient is scheduled for surgical intervention (vertebral fusion) by the end of the year. - Patient has been advised to stay off Lyrica if possible.  Preoperative Evaluation: Patient is being evaluated for preoperative clearance. - Patient has no history of  CAD, heart rhythm issues, asthma, COPD, or sleep apnea.    Diabetes: Patient requires a refill of Ozempic. - Plan to provide a 90-day prescription for Ozempic.   HLD:  -Check labs, continue diet and weight loss   Vit D deficiency: -check labs and follow  Follow-up: Plan for follow-up in 8-10 weeks (January).  Dennard Nip, MD  I have personally spent 40 minutes total time today in preparation, patient care, and documentation for this visit, including the following: review of clinical lab tests; review of medical tests/procedures/services.

## 2022-07-07 DIAGNOSIS — F341 Dysthymic disorder: Secondary | ICD-10-CM | POA: Diagnosis not present

## 2022-07-07 DIAGNOSIS — F4010 Social phobia, unspecified: Secondary | ICD-10-CM | POA: Diagnosis not present

## 2022-07-07 NOTE — Telephone Encounter (Signed)
Noted. Will need preop eval.  We may not need further labs as she just [facility].  Can we schedule her sooner ie this Friday 4pm?

## 2022-07-08 ENCOUNTER — Ambulatory Visit: Payer: BC Managed Care – PPO | Admitting: Family Medicine

## 2022-07-08 LAB — CMP14+EGFR
ALT: 16 IU/L (ref 0–32)
AST: 19 IU/L (ref 0–40)
Albumin/Globulin Ratio: 1.9 (ref 1.2–2.2)
Albumin: 4.6 g/dL (ref 3.8–4.9)
Alkaline Phosphatase: 145 IU/L — ABNORMAL HIGH (ref 44–121)
BUN/Creatinine Ratio: 15 (ref 9–23)
BUN: 11 mg/dL (ref 6–24)
Bilirubin Total: 0.4 mg/dL (ref 0.0–1.2)
CO2: 22 mmol/L (ref 20–29)
Calcium: 9.4 mg/dL (ref 8.7–10.2)
Chloride: 100 mmol/L (ref 96–106)
Creatinine, Ser: 0.73 mg/dL (ref 0.57–1.00)
Globulin, Total: 2.4 g/dL (ref 1.5–4.5)
Glucose: 94 mg/dL (ref 70–99)
Potassium: 4.1 mmol/L (ref 3.5–5.2)
Sodium: 138 mmol/L (ref 134–144)
Total Protein: 7 g/dL (ref 6.0–8.5)
eGFR: 99 mL/min/{1.73_m2} (ref >59)

## 2022-07-08 LAB — HEMOGLOBIN A1C
Est. average glucose Bld gHb Est-mCnc: 117 mg/dL
Hgb A1c MFr Bld: 5.7 % — ABNORMAL HIGH (ref 4.8–5.6)

## 2022-07-08 LAB — CBC WITH DIFFERENTIAL/PLATELET
Basophils Absolute: 0 10*3/uL (ref 0.0–0.2)
Basos: 0 %
EOS (ABSOLUTE): 0.2 10*3/uL (ref 0.0–0.4)
Eos: 2 %
Hematocrit: 43.4 % (ref 34.0–46.6)
Hemoglobin: 14.8 g/dL (ref 11.1–15.9)
Immature Grans (Abs): 0 10*3/uL (ref 0.0–0.1)
Immature Granulocytes: 0 %
Lymphocytes Absolute: 2.1 10*3/uL (ref 0.7–3.1)
Lymphs: 25 %
MCH: 30.5 pg (ref 26.6–33.0)
MCHC: 34.1 g/dL (ref 31.5–35.7)
MCV: 90 fL (ref 79–97)
Monocytes Absolute: 0.3 10*3/uL (ref 0.1–0.9)
Monocytes: 4 %
Neutrophils Absolute: 6 10*3/uL (ref 1.4–7.0)
Neutrophils: 69 %
Platelets: 288 10*3/uL (ref 150–450)
RBC: 4.85 x10E6/uL (ref 3.77–5.28)
RDW: 12.8 % (ref 11.7–15.4)
WBC: 8.6 10*3/uL (ref 3.4–10.8)

## 2022-07-08 LAB — INSULIN, RANDOM: INSULIN: 8.9 u[IU]/mL (ref 2.6–24.9)

## 2022-07-08 LAB — LIPID PANEL WITH LDL/HDL RATIO
Cholesterol, Total: 152 mg/dL (ref 100–199)
HDL: 57 mg/dL (ref >39)
LDL Chol Calc (NIH): 73 mg/dL (ref 0–99)
LDL/HDL Ratio: 1.3 ratio (ref 0.0–3.2)
Triglycerides: 124 mg/dL (ref 0–149)
VLDL Cholesterol Cal: 22 mg/dL (ref 5–40)

## 2022-07-08 LAB — TSH: TSH: 1.85 u[IU]/mL (ref 0.450–4.500)

## 2022-07-08 LAB — VITAMIN D 25 HYDROXY (VIT D DEFICIENCY, FRACTURES): Vit D, 25-Hydroxy: 56.7 ng/mL (ref 30.0–100.0)

## 2022-07-15 DIAGNOSIS — M48062 Spinal stenosis, lumbar region with neurogenic claudication: Secondary | ICD-10-CM | POA: Insufficient documentation

## 2022-07-22 DIAGNOSIS — F341 Dysthymic disorder: Secondary | ICD-10-CM | POA: Diagnosis not present

## 2022-07-22 DIAGNOSIS — F4010 Social phobia, unspecified: Secondary | ICD-10-CM | POA: Diagnosis not present

## 2022-07-29 DIAGNOSIS — M5416 Radiculopathy, lumbar region: Secondary | ICD-10-CM | POA: Diagnosis not present

## 2022-07-29 DIAGNOSIS — M532X6 Spinal instabilities, lumbar region: Secondary | ICD-10-CM | POA: Diagnosis not present

## 2022-07-29 DIAGNOSIS — M48062 Spinal stenosis, lumbar region with neurogenic claudication: Secondary | ICD-10-CM | POA: Diagnosis not present

## 2022-08-02 DIAGNOSIS — Z01812 Encounter for preprocedural laboratory examination: Secondary | ICD-10-CM | POA: Diagnosis not present

## 2022-08-02 DIAGNOSIS — Z01810 Encounter for preprocedural cardiovascular examination: Secondary | ICD-10-CM | POA: Diagnosis not present

## 2022-08-02 DIAGNOSIS — K760 Fatty (change of) liver, not elsewhere classified: Secondary | ICD-10-CM | POA: Diagnosis not present

## 2022-08-02 DIAGNOSIS — Z01818 Encounter for other preprocedural examination: Secondary | ICD-10-CM | POA: Diagnosis not present

## 2022-08-02 DIAGNOSIS — M48062 Spinal stenosis, lumbar region with neurogenic claudication: Secondary | ICD-10-CM | POA: Diagnosis not present

## 2022-08-04 DIAGNOSIS — F4010 Social phobia, unspecified: Secondary | ICD-10-CM | POA: Diagnosis not present

## 2022-08-04 DIAGNOSIS — F341 Dysthymic disorder: Secondary | ICD-10-CM | POA: Diagnosis not present

## 2022-08-08 DIAGNOSIS — M47816 Spondylosis without myelopathy or radiculopathy, lumbar region: Secondary | ICD-10-CM | POA: Diagnosis not present

## 2022-08-08 DIAGNOSIS — M5116 Intervertebral disc disorders with radiculopathy, lumbar region: Secondary | ICD-10-CM | POA: Diagnosis not present

## 2022-08-08 DIAGNOSIS — Z981 Arthrodesis status: Secondary | ICD-10-CM | POA: Diagnosis not present

## 2022-08-08 DIAGNOSIS — Z9989 Dependence on other enabling machines and devices: Secondary | ICD-10-CM | POA: Diagnosis not present

## 2022-08-08 DIAGNOSIS — Z6841 Body Mass Index (BMI) 40.0 and over, adult: Secondary | ICD-10-CM | POA: Diagnosis not present

## 2022-08-08 DIAGNOSIS — Z7985 Long-term (current) use of injectable non-insulin antidiabetic drugs: Secondary | ICD-10-CM | POA: Diagnosis not present

## 2022-08-08 DIAGNOSIS — M5136 Other intervertebral disc degeneration, lumbar region: Secondary | ICD-10-CM | POA: Diagnosis not present

## 2022-08-08 DIAGNOSIS — G4733 Obstructive sleep apnea (adult) (pediatric): Secondary | ICD-10-CM | POA: Diagnosis not present

## 2022-08-08 DIAGNOSIS — Z9889 Other specified postprocedural states: Secondary | ICD-10-CM | POA: Diagnosis not present

## 2022-08-08 DIAGNOSIS — M4010 Other secondary kyphosis, site unspecified: Secondary | ICD-10-CM | POA: Diagnosis not present

## 2022-08-08 DIAGNOSIS — M4726 Other spondylosis with radiculopathy, lumbar region: Secondary | ICD-10-CM | POA: Diagnosis not present

## 2022-08-08 DIAGNOSIS — E119 Type 2 diabetes mellitus without complications: Secondary | ICD-10-CM | POA: Diagnosis not present

## 2022-08-08 DIAGNOSIS — E785 Hyperlipidemia, unspecified: Secondary | ICD-10-CM | POA: Diagnosis not present

## 2022-08-08 DIAGNOSIS — M48062 Spinal stenosis, lumbar region with neurogenic claudication: Secondary | ICD-10-CM | POA: Diagnosis not present

## 2022-08-08 DIAGNOSIS — M4316 Spondylolisthesis, lumbar region: Secondary | ICD-10-CM | POA: Diagnosis not present

## 2022-08-08 DIAGNOSIS — M4800 Spinal stenosis, site unspecified: Secondary | ICD-10-CM | POA: Diagnosis not present

## 2022-08-08 DIAGNOSIS — M532X6 Spinal instabilities, lumbar region: Secondary | ICD-10-CM | POA: Diagnosis not present

## 2022-08-08 DIAGNOSIS — F32A Depression, unspecified: Secondary | ICD-10-CM | POA: Diagnosis not present

## 2022-08-08 DIAGNOSIS — Z91199 Patient's noncompliance with other medical treatment and regimen due to unspecified reason: Secondary | ICD-10-CM | POA: Diagnosis not present

## 2022-08-08 DIAGNOSIS — K219 Gastro-esophageal reflux disease without esophagitis: Secondary | ICD-10-CM | POA: Diagnosis not present

## 2022-08-18 DIAGNOSIS — M9973 Connective tissue and disc stenosis of intervertebral foramina of lumbar region: Secondary | ICD-10-CM | POA: Diagnosis not present

## 2022-08-18 DIAGNOSIS — F419 Anxiety disorder, unspecified: Secondary | ICD-10-CM | POA: Diagnosis not present

## 2022-08-18 DIAGNOSIS — M5136 Other intervertebral disc degeneration, lumbar region: Secondary | ICD-10-CM | POA: Diagnosis not present

## 2022-08-18 DIAGNOSIS — Z4789 Encounter for other orthopedic aftercare: Secondary | ICD-10-CM | POA: Diagnosis not present

## 2022-08-18 DIAGNOSIS — F32A Depression, unspecified: Secondary | ICD-10-CM | POA: Diagnosis not present

## 2022-08-18 DIAGNOSIS — M47817 Spondylosis without myelopathy or radiculopathy, lumbosacral region: Secondary | ICD-10-CM | POA: Diagnosis not present

## 2022-08-18 DIAGNOSIS — K589 Irritable bowel syndrome without diarrhea: Secondary | ICD-10-CM | POA: Diagnosis not present

## 2022-08-18 DIAGNOSIS — G4733 Obstructive sleep apnea (adult) (pediatric): Secondary | ICD-10-CM | POA: Diagnosis not present

## 2022-08-18 DIAGNOSIS — Z7985 Long-term (current) use of injectable non-insulin antidiabetic drugs: Secondary | ICD-10-CM | POA: Diagnosis not present

## 2022-08-18 DIAGNOSIS — L304 Erythema intertrigo: Secondary | ICD-10-CM | POA: Diagnosis not present

## 2022-08-18 DIAGNOSIS — Z981 Arthrodesis status: Secondary | ICD-10-CM | POA: Diagnosis not present

## 2022-08-18 DIAGNOSIS — M48062 Spinal stenosis, lumbar region with neurogenic claudication: Secondary | ICD-10-CM | POA: Diagnosis not present

## 2022-08-18 DIAGNOSIS — M4156 Other secondary scoliosis, lumbar region: Secondary | ICD-10-CM | POA: Diagnosis not present

## 2022-08-18 DIAGNOSIS — E785 Hyperlipidemia, unspecified: Secondary | ICD-10-CM | POA: Diagnosis not present

## 2022-08-18 DIAGNOSIS — E119 Type 2 diabetes mellitus without complications: Secondary | ICD-10-CM | POA: Diagnosis not present

## 2022-08-18 DIAGNOSIS — K760 Fatty (change of) liver, not elsewhere classified: Secondary | ICD-10-CM | POA: Diagnosis not present

## 2022-08-20 DIAGNOSIS — K589 Irritable bowel syndrome without diarrhea: Secondary | ICD-10-CM | POA: Diagnosis not present

## 2022-08-20 DIAGNOSIS — M4156 Other secondary scoliosis, lumbar region: Secondary | ICD-10-CM | POA: Diagnosis not present

## 2022-08-20 DIAGNOSIS — F419 Anxiety disorder, unspecified: Secondary | ICD-10-CM | POA: Diagnosis not present

## 2022-08-20 DIAGNOSIS — M9973 Connective tissue and disc stenosis of intervertebral foramina of lumbar region: Secondary | ICD-10-CM | POA: Diagnosis not present

## 2022-08-20 DIAGNOSIS — K760 Fatty (change of) liver, not elsewhere classified: Secondary | ICD-10-CM | POA: Diagnosis not present

## 2022-08-20 DIAGNOSIS — G4733 Obstructive sleep apnea (adult) (pediatric): Secondary | ICD-10-CM | POA: Diagnosis not present

## 2022-08-20 DIAGNOSIS — L304 Erythema intertrigo: Secondary | ICD-10-CM | POA: Diagnosis not present

## 2022-08-20 DIAGNOSIS — E119 Type 2 diabetes mellitus without complications: Secondary | ICD-10-CM | POA: Diagnosis not present

## 2022-08-20 DIAGNOSIS — M48062 Spinal stenosis, lumbar region with neurogenic claudication: Secondary | ICD-10-CM | POA: Diagnosis not present

## 2022-08-20 DIAGNOSIS — Z7985 Long-term (current) use of injectable non-insulin antidiabetic drugs: Secondary | ICD-10-CM | POA: Diagnosis not present

## 2022-08-20 DIAGNOSIS — M47817 Spondylosis without myelopathy or radiculopathy, lumbosacral region: Secondary | ICD-10-CM | POA: Diagnosis not present

## 2022-08-20 DIAGNOSIS — Z981 Arthrodesis status: Secondary | ICD-10-CM | POA: Diagnosis not present

## 2022-08-20 DIAGNOSIS — E785 Hyperlipidemia, unspecified: Secondary | ICD-10-CM | POA: Diagnosis not present

## 2022-08-20 DIAGNOSIS — Z4789 Encounter for other orthopedic aftercare: Secondary | ICD-10-CM | POA: Diagnosis not present

## 2022-08-20 DIAGNOSIS — F32A Depression, unspecified: Secondary | ICD-10-CM | POA: Diagnosis not present

## 2022-08-20 DIAGNOSIS — M5136 Other intervertebral disc degeneration, lumbar region: Secondary | ICD-10-CM | POA: Diagnosis not present

## 2022-08-22 ENCOUNTER — Telehealth: Payer: Self-pay | Admitting: Family Medicine

## 2022-08-22 NOTE — Telephone Encounter (Signed)
Home Health verbal orders [place] Name: [facility] number: [phone]  Requesting OT/PT/Skilled nursing/Social Work/Speech:OT  Reason:  Frequency:1 time for a one week, 2 x time a week for 4 weeks  Please forward to [facility] pool or providers CMA

## 2022-08-22 NOTE — Telephone Encounter (Signed)
Left message on voicemail for [place] with home health to call the office back. Need to real Dr. Bosie Clos message.

## 2022-08-22 NOTE — Telephone Encounter (Addendum)
She had lumbar spine surgery last month. This should go to her surgeon's office who is managing post-op care

## 2022-08-23 DIAGNOSIS — M9973 Connective tissue and disc stenosis of intervertebral foramina of lumbar region: Secondary | ICD-10-CM | POA: Diagnosis not present

## 2022-08-23 DIAGNOSIS — K760 Fatty (change of) liver, not elsewhere classified: Secondary | ICD-10-CM | POA: Diagnosis not present

## 2022-08-23 DIAGNOSIS — F32A Depression, unspecified: Secondary | ICD-10-CM | POA: Diagnosis not present

## 2022-08-23 DIAGNOSIS — G4733 Obstructive sleep apnea (adult) (pediatric): Secondary | ICD-10-CM | POA: Diagnosis not present

## 2022-08-23 DIAGNOSIS — K589 Irritable bowel syndrome without diarrhea: Secondary | ICD-10-CM | POA: Diagnosis not present

## 2022-08-23 DIAGNOSIS — M48062 Spinal stenosis, lumbar region with neurogenic claudication: Secondary | ICD-10-CM | POA: Diagnosis not present

## 2022-08-23 DIAGNOSIS — Z981 Arthrodesis status: Secondary | ICD-10-CM | POA: Diagnosis not present

## 2022-08-23 DIAGNOSIS — F419 Anxiety disorder, unspecified: Secondary | ICD-10-CM | POA: Diagnosis not present

## 2022-08-23 DIAGNOSIS — M5136 Other intervertebral disc degeneration, lumbar region: Secondary | ICD-10-CM | POA: Diagnosis not present

## 2022-08-23 DIAGNOSIS — L304 Erythema intertrigo: Secondary | ICD-10-CM | POA: Diagnosis not present

## 2022-08-23 DIAGNOSIS — E785 Hyperlipidemia, unspecified: Secondary | ICD-10-CM | POA: Diagnosis not present

## 2022-08-23 DIAGNOSIS — M4156 Other secondary scoliosis, lumbar region: Secondary | ICD-10-CM | POA: Diagnosis not present

## 2022-08-23 DIAGNOSIS — Z4789 Encounter for other orthopedic aftercare: Secondary | ICD-10-CM | POA: Diagnosis not present

## 2022-08-23 DIAGNOSIS — E119 Type 2 diabetes mellitus without complications: Secondary | ICD-10-CM | POA: Diagnosis not present

## 2022-08-23 DIAGNOSIS — Z7985 Long-term (current) use of injectable non-insulin antidiabetic drugs: Secondary | ICD-10-CM | POA: Diagnosis not present

## 2022-08-23 DIAGNOSIS — M47817 Spondylosis without myelopathy or radiculopathy, lumbosacral region: Secondary | ICD-10-CM | POA: Diagnosis not present

## 2022-08-23 NOTE — Telephone Encounter (Signed)
Lvm asking pt to call back.  Need to relay Dr. G's message.  

## 2022-08-24 DIAGNOSIS — F32A Depression, unspecified: Secondary | ICD-10-CM | POA: Diagnosis not present

## 2022-08-24 DIAGNOSIS — M9973 Connective tissue and disc stenosis of intervertebral foramina of lumbar region: Secondary | ICD-10-CM | POA: Diagnosis not present

## 2022-08-24 DIAGNOSIS — K760 Fatty (change of) liver, not elsewhere classified: Secondary | ICD-10-CM | POA: Diagnosis not present

## 2022-08-24 DIAGNOSIS — M4156 Other secondary scoliosis, lumbar region: Secondary | ICD-10-CM | POA: Diagnosis not present

## 2022-08-24 DIAGNOSIS — E119 Type 2 diabetes mellitus without complications: Secondary | ICD-10-CM | POA: Diagnosis not present

## 2022-08-24 DIAGNOSIS — F419 Anxiety disorder, unspecified: Secondary | ICD-10-CM | POA: Diagnosis not present

## 2022-08-24 DIAGNOSIS — G4733 Obstructive sleep apnea (adult) (pediatric): Secondary | ICD-10-CM | POA: Diagnosis not present

## 2022-08-24 DIAGNOSIS — L304 Erythema intertrigo: Secondary | ICD-10-CM | POA: Diagnosis not present

## 2022-08-24 DIAGNOSIS — M47817 Spondylosis without myelopathy or radiculopathy, lumbosacral region: Secondary | ICD-10-CM | POA: Diagnosis not present

## 2022-08-24 DIAGNOSIS — K589 Irritable bowel syndrome without diarrhea: Secondary | ICD-10-CM | POA: Diagnosis not present

## 2022-08-24 DIAGNOSIS — M5136 Other intervertebral disc degeneration, lumbar region: Secondary | ICD-10-CM | POA: Diagnosis not present

## 2022-08-24 DIAGNOSIS — Z7985 Long-term (current) use of injectable non-insulin antidiabetic drugs: Secondary | ICD-10-CM | POA: Diagnosis not present

## 2022-08-24 DIAGNOSIS — Z4789 Encounter for other orthopedic aftercare: Secondary | ICD-10-CM | POA: Diagnosis not present

## 2022-08-24 DIAGNOSIS — M48062 Spinal stenosis, lumbar region with neurogenic claudication: Secondary | ICD-10-CM | POA: Diagnosis not present

## 2022-08-24 DIAGNOSIS — Z981 Arthrodesis status: Secondary | ICD-10-CM | POA: Diagnosis not present

## 2022-08-24 DIAGNOSIS — E785 Hyperlipidemia, unspecified: Secondary | ICD-10-CM | POA: Diagnosis not present

## 2022-08-24 NOTE — Telephone Encounter (Signed)
Lvm asking pt to call back. Need to relay Dr. Synthia Innocent message.   Also, faxed [place] orders back to [facility] at [phone] relaying Dr. Synthia Innocent message.

## 2022-08-29 DIAGNOSIS — M9973 Connective tissue and disc stenosis of intervertebral foramina of lumbar region: Secondary | ICD-10-CM | POA: Diagnosis not present

## 2022-08-29 DIAGNOSIS — M5136 Other intervertebral disc degeneration, lumbar region: Secondary | ICD-10-CM | POA: Diagnosis not present

## 2022-08-29 DIAGNOSIS — G4733 Obstructive sleep apnea (adult) (pediatric): Secondary | ICD-10-CM | POA: Diagnosis not present

## 2022-08-29 DIAGNOSIS — M48062 Spinal stenosis, lumbar region with neurogenic claudication: Secondary | ICD-10-CM | POA: Diagnosis not present

## 2022-08-29 DIAGNOSIS — E119 Type 2 diabetes mellitus without complications: Secondary | ICD-10-CM | POA: Diagnosis not present

## 2022-08-29 DIAGNOSIS — F419 Anxiety disorder, unspecified: Secondary | ICD-10-CM | POA: Diagnosis not present

## 2022-08-29 DIAGNOSIS — E785 Hyperlipidemia, unspecified: Secondary | ICD-10-CM | POA: Diagnosis not present

## 2022-08-29 DIAGNOSIS — K760 Fatty (change of) liver, not elsewhere classified: Secondary | ICD-10-CM | POA: Diagnosis not present

## 2022-08-29 DIAGNOSIS — K589 Irritable bowel syndrome without diarrhea: Secondary | ICD-10-CM | POA: Diagnosis not present

## 2022-08-29 DIAGNOSIS — Z981 Arthrodesis status: Secondary | ICD-10-CM | POA: Diagnosis not present

## 2022-08-29 DIAGNOSIS — M47817 Spondylosis without myelopathy or radiculopathy, lumbosacral region: Secondary | ICD-10-CM | POA: Diagnosis not present

## 2022-08-29 DIAGNOSIS — Z4789 Encounter for other orthopedic aftercare: Secondary | ICD-10-CM | POA: Diagnosis not present

## 2022-08-29 DIAGNOSIS — Z7985 Long-term (current) use of injectable non-insulin antidiabetic drugs: Secondary | ICD-10-CM | POA: Diagnosis not present

## 2022-08-29 DIAGNOSIS — F32A Depression, unspecified: Secondary | ICD-10-CM | POA: Diagnosis not present

## 2022-08-29 DIAGNOSIS — L304 Erythema intertrigo: Secondary | ICD-10-CM | POA: Diagnosis not present

## 2022-08-29 DIAGNOSIS — M4156 Other secondary scoliosis, lumbar region: Secondary | ICD-10-CM | POA: Diagnosis not present

## 2022-08-30 DIAGNOSIS — M4326 Fusion of spine, lumbar region: Secondary | ICD-10-CM | POA: Diagnosis not present

## 2022-08-30 DIAGNOSIS — F4010 Social phobia, unspecified: Secondary | ICD-10-CM | POA: Diagnosis not present

## 2022-08-30 DIAGNOSIS — F341 Dysthymic disorder: Secondary | ICD-10-CM | POA: Diagnosis not present

## 2022-08-31 DIAGNOSIS — F419 Anxiety disorder, unspecified: Secondary | ICD-10-CM | POA: Diagnosis not present

## 2022-08-31 DIAGNOSIS — M9973 Connective tissue and disc stenosis of intervertebral foramina of lumbar region: Secondary | ICD-10-CM | POA: Diagnosis not present

## 2022-08-31 DIAGNOSIS — Z7985 Long-term (current) use of injectable non-insulin antidiabetic drugs: Secondary | ICD-10-CM | POA: Diagnosis not present

## 2022-08-31 DIAGNOSIS — F39 Unspecified mood [affective] disorder: Secondary | ICD-10-CM | POA: Diagnosis not present

## 2022-08-31 DIAGNOSIS — K760 Fatty (change of) liver, not elsewhere classified: Secondary | ICD-10-CM | POA: Diagnosis not present

## 2022-08-31 DIAGNOSIS — M5136 Other intervertebral disc degeneration, lumbar region: Secondary | ICD-10-CM | POA: Diagnosis not present

## 2022-08-31 DIAGNOSIS — Z4789 Encounter for other orthopedic aftercare: Secondary | ICD-10-CM | POA: Diagnosis not present

## 2022-08-31 DIAGNOSIS — M48062 Spinal stenosis, lumbar region with neurogenic claudication: Secondary | ICD-10-CM | POA: Diagnosis not present

## 2022-08-31 DIAGNOSIS — M47817 Spondylosis without myelopathy or radiculopathy, lumbosacral region: Secondary | ICD-10-CM | POA: Diagnosis not present

## 2022-08-31 DIAGNOSIS — F5105 Insomnia due to other mental disorder: Secondary | ICD-10-CM | POA: Diagnosis not present

## 2022-08-31 DIAGNOSIS — L304 Erythema intertrigo: Secondary | ICD-10-CM | POA: Diagnosis not present

## 2022-08-31 DIAGNOSIS — E119 Type 2 diabetes mellitus without complications: Secondary | ICD-10-CM | POA: Diagnosis not present

## 2022-08-31 DIAGNOSIS — Z981 Arthrodesis status: Secondary | ICD-10-CM | POA: Diagnosis not present

## 2022-08-31 DIAGNOSIS — F32A Depression, unspecified: Secondary | ICD-10-CM | POA: Diagnosis not present

## 2022-08-31 DIAGNOSIS — M4156 Other secondary scoliosis, lumbar region: Secondary | ICD-10-CM | POA: Diagnosis not present

## 2022-08-31 DIAGNOSIS — K589 Irritable bowel syndrome without diarrhea: Secondary | ICD-10-CM | POA: Diagnosis not present

## 2022-08-31 DIAGNOSIS — F411 Generalized anxiety disorder: Secondary | ICD-10-CM | POA: Diagnosis not present

## 2022-08-31 DIAGNOSIS — E785 Hyperlipidemia, unspecified: Secondary | ICD-10-CM | POA: Diagnosis not present

## 2022-08-31 DIAGNOSIS — G4733 Obstructive sleep apnea (adult) (pediatric): Secondary | ICD-10-CM | POA: Diagnosis not present

## 2022-09-01 DIAGNOSIS — G4733 Obstructive sleep apnea (adult) (pediatric): Secondary | ICD-10-CM | POA: Diagnosis not present

## 2022-09-01 DIAGNOSIS — K760 Fatty (change of) liver, not elsewhere classified: Secondary | ICD-10-CM | POA: Diagnosis not present

## 2022-09-01 DIAGNOSIS — F419 Anxiety disorder, unspecified: Secondary | ICD-10-CM | POA: Diagnosis not present

## 2022-09-01 DIAGNOSIS — Z4789 Encounter for other orthopedic aftercare: Secondary | ICD-10-CM | POA: Diagnosis not present

## 2022-09-01 DIAGNOSIS — E119 Type 2 diabetes mellitus without complications: Secondary | ICD-10-CM | POA: Diagnosis not present

## 2022-09-01 DIAGNOSIS — K589 Irritable bowel syndrome without diarrhea: Secondary | ICD-10-CM | POA: Diagnosis not present

## 2022-09-01 DIAGNOSIS — M4156 Other secondary scoliosis, lumbar region: Secondary | ICD-10-CM | POA: Diagnosis not present

## 2022-09-01 DIAGNOSIS — E785 Hyperlipidemia, unspecified: Secondary | ICD-10-CM | POA: Diagnosis not present

## 2022-09-01 DIAGNOSIS — Z7985 Long-term (current) use of injectable non-insulin antidiabetic drugs: Secondary | ICD-10-CM | POA: Diagnosis not present

## 2022-09-01 DIAGNOSIS — M5136 Other intervertebral disc degeneration, lumbar region: Secondary | ICD-10-CM | POA: Diagnosis not present

## 2022-09-01 DIAGNOSIS — Z981 Arthrodesis status: Secondary | ICD-10-CM | POA: Diagnosis not present

## 2022-09-01 DIAGNOSIS — F32A Depression, unspecified: Secondary | ICD-10-CM | POA: Diagnosis not present

## 2022-09-01 DIAGNOSIS — M47817 Spondylosis without myelopathy or radiculopathy, lumbosacral region: Secondary | ICD-10-CM | POA: Diagnosis not present

## 2022-09-01 DIAGNOSIS — L304 Erythema intertrigo: Secondary | ICD-10-CM | POA: Diagnosis not present

## 2022-09-01 DIAGNOSIS — M48062 Spinal stenosis, lumbar region with neurogenic claudication: Secondary | ICD-10-CM | POA: Diagnosis not present

## 2022-09-01 DIAGNOSIS — M9973 Connective tissue and disc stenosis of intervertebral foramina of lumbar region: Secondary | ICD-10-CM | POA: Diagnosis not present

## 2022-09-02 DIAGNOSIS — Z981 Arthrodesis status: Secondary | ICD-10-CM | POA: Diagnosis not present

## 2022-09-02 DIAGNOSIS — F419 Anxiety disorder, unspecified: Secondary | ICD-10-CM | POA: Diagnosis not present

## 2022-09-02 DIAGNOSIS — E119 Type 2 diabetes mellitus without complications: Secondary | ICD-10-CM | POA: Diagnosis not present

## 2022-09-02 DIAGNOSIS — Z7985 Long-term (current) use of injectable non-insulin antidiabetic drugs: Secondary | ICD-10-CM | POA: Diagnosis not present

## 2022-09-02 DIAGNOSIS — M5136 Other intervertebral disc degeneration, lumbar region: Secondary | ICD-10-CM | POA: Diagnosis not present

## 2022-09-02 DIAGNOSIS — K589 Irritable bowel syndrome without diarrhea: Secondary | ICD-10-CM | POA: Diagnosis not present

## 2022-09-02 DIAGNOSIS — F32A Depression, unspecified: Secondary | ICD-10-CM | POA: Diagnosis not present

## 2022-09-02 DIAGNOSIS — L304 Erythema intertrigo: Secondary | ICD-10-CM | POA: Diagnosis not present

## 2022-09-02 DIAGNOSIS — K760 Fatty (change of) liver, not elsewhere classified: Secondary | ICD-10-CM | POA: Diagnosis not present

## 2022-09-02 DIAGNOSIS — G4733 Obstructive sleep apnea (adult) (pediatric): Secondary | ICD-10-CM | POA: Diagnosis not present

## 2022-09-02 DIAGNOSIS — M48062 Spinal stenosis, lumbar region with neurogenic claudication: Secondary | ICD-10-CM | POA: Diagnosis not present

## 2022-09-02 DIAGNOSIS — E785 Hyperlipidemia, unspecified: Secondary | ICD-10-CM | POA: Diagnosis not present

## 2022-09-02 DIAGNOSIS — M47817 Spondylosis without myelopathy or radiculopathy, lumbosacral region: Secondary | ICD-10-CM | POA: Diagnosis not present

## 2022-09-02 DIAGNOSIS — Z4789 Encounter for other orthopedic aftercare: Secondary | ICD-10-CM | POA: Diagnosis not present

## 2022-09-02 DIAGNOSIS — M9973 Connective tissue and disc stenosis of intervertebral foramina of lumbar region: Secondary | ICD-10-CM | POA: Diagnosis not present

## 2022-09-02 DIAGNOSIS — M4156 Other secondary scoliosis, lumbar region: Secondary | ICD-10-CM | POA: Diagnosis not present

## 2022-09-05 ENCOUNTER — Telehealth: Payer: Self-pay | Admitting: Family Medicine

## 2022-09-05 NOTE — Telephone Encounter (Signed)
I called patient to speak to her about this request.  This is for disability which is not through her employer.  She is working with an Forensic psychologist, Nurse, mental health at [employer], phone # [phone].  She is also seeing an orthopedist, but she is not sure if they have filled out any paperwork on her behalf.  I have printed the request form that was originally faxed to [facility] on 08/10/2022.  This has been faxed to Health Information Management Dept at fax# [phone], per Dr Darnell Level.  Received fax confirmation.

## 2022-09-05 NOTE — Telephone Encounter (Signed)
Colletta Maryland from [place] called in stating that she sent in a request on 07/31/2022 for medical records on behalf of patient. She hasn't received anything back. The only thing she received back was what she sen to [place].

## 2022-09-06 DIAGNOSIS — F419 Anxiety disorder, unspecified: Secondary | ICD-10-CM | POA: Diagnosis not present

## 2022-09-06 DIAGNOSIS — E785 Hyperlipidemia, unspecified: Secondary | ICD-10-CM | POA: Diagnosis not present

## 2022-09-06 DIAGNOSIS — M9973 Connective tissue and disc stenosis of intervertebral foramina of lumbar region: Secondary | ICD-10-CM | POA: Diagnosis not present

## 2022-09-06 DIAGNOSIS — Z7985 Long-term (current) use of injectable non-insulin antidiabetic drugs: Secondary | ICD-10-CM | POA: Diagnosis not present

## 2022-09-06 DIAGNOSIS — Z4789 Encounter for other orthopedic aftercare: Secondary | ICD-10-CM | POA: Diagnosis not present

## 2022-09-06 DIAGNOSIS — L304 Erythema intertrigo: Secondary | ICD-10-CM | POA: Diagnosis not present

## 2022-09-06 DIAGNOSIS — F32A Depression, unspecified: Secondary | ICD-10-CM | POA: Diagnosis not present

## 2022-09-06 DIAGNOSIS — M4156 Other secondary scoliosis, lumbar region: Secondary | ICD-10-CM | POA: Diagnosis not present

## 2022-09-06 DIAGNOSIS — E119 Type 2 diabetes mellitus without complications: Secondary | ICD-10-CM | POA: Diagnosis not present

## 2022-09-06 DIAGNOSIS — K589 Irritable bowel syndrome without diarrhea: Secondary | ICD-10-CM | POA: Diagnosis not present

## 2022-09-06 DIAGNOSIS — M48062 Spinal stenosis, lumbar region with neurogenic claudication: Secondary | ICD-10-CM | POA: Diagnosis not present

## 2022-09-06 DIAGNOSIS — G4733 Obstructive sleep apnea (adult) (pediatric): Secondary | ICD-10-CM | POA: Diagnosis not present

## 2022-09-06 DIAGNOSIS — K760 Fatty (change of) liver, not elsewhere classified: Secondary | ICD-10-CM | POA: Diagnosis not present

## 2022-09-06 DIAGNOSIS — M47817 Spondylosis without myelopathy or radiculopathy, lumbosacral region: Secondary | ICD-10-CM | POA: Diagnosis not present

## 2022-09-06 DIAGNOSIS — M5136 Other intervertebral disc degeneration, lumbar region: Secondary | ICD-10-CM | POA: Diagnosis not present

## 2022-09-06 DIAGNOSIS — Z981 Arthrodesis status: Secondary | ICD-10-CM | POA: Diagnosis not present

## 2022-09-08 DIAGNOSIS — G4733 Obstructive sleep apnea (adult) (pediatric): Secondary | ICD-10-CM | POA: Diagnosis not present

## 2022-09-08 DIAGNOSIS — Z7985 Long-term (current) use of injectable non-insulin antidiabetic drugs: Secondary | ICD-10-CM | POA: Diagnosis not present

## 2022-09-08 DIAGNOSIS — K589 Irritable bowel syndrome without diarrhea: Secondary | ICD-10-CM | POA: Diagnosis not present

## 2022-09-08 DIAGNOSIS — F32A Depression, unspecified: Secondary | ICD-10-CM | POA: Diagnosis not present

## 2022-09-08 DIAGNOSIS — M5136 Other intervertebral disc degeneration, lumbar region: Secondary | ICD-10-CM | POA: Diagnosis not present

## 2022-09-08 DIAGNOSIS — M9973 Connective tissue and disc stenosis of intervertebral foramina of lumbar region: Secondary | ICD-10-CM | POA: Diagnosis not present

## 2022-09-08 DIAGNOSIS — F419 Anxiety disorder, unspecified: Secondary | ICD-10-CM | POA: Diagnosis not present

## 2022-09-08 DIAGNOSIS — L304 Erythema intertrigo: Secondary | ICD-10-CM | POA: Diagnosis not present

## 2022-09-08 DIAGNOSIS — M47817 Spondylosis without myelopathy or radiculopathy, lumbosacral region: Secondary | ICD-10-CM | POA: Diagnosis not present

## 2022-09-08 DIAGNOSIS — K760 Fatty (change of) liver, not elsewhere classified: Secondary | ICD-10-CM | POA: Diagnosis not present

## 2022-09-08 DIAGNOSIS — E785 Hyperlipidemia, unspecified: Secondary | ICD-10-CM | POA: Diagnosis not present

## 2022-09-08 DIAGNOSIS — Z981 Arthrodesis status: Secondary | ICD-10-CM | POA: Diagnosis not present

## 2022-09-08 DIAGNOSIS — M48062 Spinal stenosis, lumbar region with neurogenic claudication: Secondary | ICD-10-CM | POA: Diagnosis not present

## 2022-09-08 DIAGNOSIS — M4156 Other secondary scoliosis, lumbar region: Secondary | ICD-10-CM | POA: Diagnosis not present

## 2022-09-08 DIAGNOSIS — E119 Type 2 diabetes mellitus without complications: Secondary | ICD-10-CM | POA: Diagnosis not present

## 2022-09-08 DIAGNOSIS — Z4789 Encounter for other orthopedic aftercare: Secondary | ICD-10-CM | POA: Diagnosis not present

## 2022-09-13 DIAGNOSIS — K760 Fatty (change of) liver, not elsewhere classified: Secondary | ICD-10-CM | POA: Diagnosis not present

## 2022-09-13 DIAGNOSIS — M9973 Connective tissue and disc stenosis of intervertebral foramina of lumbar region: Secondary | ICD-10-CM | POA: Diagnosis not present

## 2022-09-13 DIAGNOSIS — E119 Type 2 diabetes mellitus without complications: Secondary | ICD-10-CM | POA: Diagnosis not present

## 2022-09-13 DIAGNOSIS — F32A Depression, unspecified: Secondary | ICD-10-CM | POA: Diagnosis not present

## 2022-09-13 DIAGNOSIS — M5136 Other intervertebral disc degeneration, lumbar region: Secondary | ICD-10-CM | POA: Diagnosis not present

## 2022-09-13 DIAGNOSIS — K589 Irritable bowel syndrome without diarrhea: Secondary | ICD-10-CM | POA: Diagnosis not present

## 2022-09-13 DIAGNOSIS — E785 Hyperlipidemia, unspecified: Secondary | ICD-10-CM | POA: Diagnosis not present

## 2022-09-13 DIAGNOSIS — M47817 Spondylosis without myelopathy or radiculopathy, lumbosacral region: Secondary | ICD-10-CM | POA: Diagnosis not present

## 2022-09-13 DIAGNOSIS — F419 Anxiety disorder, unspecified: Secondary | ICD-10-CM | POA: Diagnosis not present

## 2022-09-13 DIAGNOSIS — M48062 Spinal stenosis, lumbar region with neurogenic claudication: Secondary | ICD-10-CM | POA: Diagnosis not present

## 2022-09-13 DIAGNOSIS — Z7985 Long-term (current) use of injectable non-insulin antidiabetic drugs: Secondary | ICD-10-CM | POA: Diagnosis not present

## 2022-09-13 DIAGNOSIS — L304 Erythema intertrigo: Secondary | ICD-10-CM | POA: Diagnosis not present

## 2022-09-13 DIAGNOSIS — M4156 Other secondary scoliosis, lumbar region: Secondary | ICD-10-CM | POA: Diagnosis not present

## 2022-09-13 DIAGNOSIS — G4733 Obstructive sleep apnea (adult) (pediatric): Secondary | ICD-10-CM | POA: Diagnosis not present

## 2022-09-13 DIAGNOSIS — Z4789 Encounter for other orthopedic aftercare: Secondary | ICD-10-CM | POA: Diagnosis not present

## 2022-09-13 DIAGNOSIS — Z981 Arthrodesis status: Secondary | ICD-10-CM | POA: Diagnosis not present

## 2022-09-14 DIAGNOSIS — E119 Type 2 diabetes mellitus without complications: Secondary | ICD-10-CM | POA: Diagnosis not present

## 2022-09-14 DIAGNOSIS — Z7985 Long-term (current) use of injectable non-insulin antidiabetic drugs: Secondary | ICD-10-CM | POA: Diagnosis not present

## 2022-09-14 DIAGNOSIS — M5136 Other intervertebral disc degeneration, lumbar region: Secondary | ICD-10-CM | POA: Diagnosis not present

## 2022-09-14 DIAGNOSIS — M4156 Other secondary scoliosis, lumbar region: Secondary | ICD-10-CM | POA: Diagnosis not present

## 2022-09-14 DIAGNOSIS — G4733 Obstructive sleep apnea (adult) (pediatric): Secondary | ICD-10-CM | POA: Diagnosis not present

## 2022-09-14 DIAGNOSIS — E785 Hyperlipidemia, unspecified: Secondary | ICD-10-CM | POA: Diagnosis not present

## 2022-09-14 DIAGNOSIS — M9973 Connective tissue and disc stenosis of intervertebral foramina of lumbar region: Secondary | ICD-10-CM | POA: Diagnosis not present

## 2022-09-14 DIAGNOSIS — F32A Depression, unspecified: Secondary | ICD-10-CM | POA: Diagnosis not present

## 2022-09-14 DIAGNOSIS — L304 Erythema intertrigo: Secondary | ICD-10-CM | POA: Diagnosis not present

## 2022-09-14 DIAGNOSIS — Z981 Arthrodesis status: Secondary | ICD-10-CM | POA: Diagnosis not present

## 2022-09-14 DIAGNOSIS — K589 Irritable bowel syndrome without diarrhea: Secondary | ICD-10-CM | POA: Diagnosis not present

## 2022-09-14 DIAGNOSIS — Z4789 Encounter for other orthopedic aftercare: Secondary | ICD-10-CM | POA: Diagnosis not present

## 2022-09-14 DIAGNOSIS — K760 Fatty (change of) liver, not elsewhere classified: Secondary | ICD-10-CM | POA: Diagnosis not present

## 2022-09-14 DIAGNOSIS — F419 Anxiety disorder, unspecified: Secondary | ICD-10-CM | POA: Diagnosis not present

## 2022-09-14 DIAGNOSIS — M47817 Spondylosis without myelopathy or radiculopathy, lumbosacral region: Secondary | ICD-10-CM | POA: Diagnosis not present

## 2022-09-14 DIAGNOSIS — M48062 Spinal stenosis, lumbar region with neurogenic claudication: Secondary | ICD-10-CM | POA: Diagnosis not present

## 2022-09-15 DIAGNOSIS — F4010 Social phobia, unspecified: Secondary | ICD-10-CM | POA: Diagnosis not present

## 2022-09-15 DIAGNOSIS — E119 Type 2 diabetes mellitus without complications: Secondary | ICD-10-CM | POA: Diagnosis not present

## 2022-09-15 DIAGNOSIS — M48062 Spinal stenosis, lumbar region with neurogenic claudication: Secondary | ICD-10-CM | POA: Diagnosis not present

## 2022-09-15 DIAGNOSIS — F32A Depression, unspecified: Secondary | ICD-10-CM | POA: Diagnosis not present

## 2022-09-15 DIAGNOSIS — Z7985 Long-term (current) use of injectable non-insulin antidiabetic drugs: Secondary | ICD-10-CM | POA: Diagnosis not present

## 2022-09-15 DIAGNOSIS — K760 Fatty (change of) liver, not elsewhere classified: Secondary | ICD-10-CM | POA: Diagnosis not present

## 2022-09-15 DIAGNOSIS — M4156 Other secondary scoliosis, lumbar region: Secondary | ICD-10-CM | POA: Diagnosis not present

## 2022-09-15 DIAGNOSIS — Z981 Arthrodesis status: Secondary | ICD-10-CM | POA: Diagnosis not present

## 2022-09-15 DIAGNOSIS — M9973 Connective tissue and disc stenosis of intervertebral foramina of lumbar region: Secondary | ICD-10-CM | POA: Diagnosis not present

## 2022-09-15 DIAGNOSIS — L304 Erythema intertrigo: Secondary | ICD-10-CM | POA: Diagnosis not present

## 2022-09-15 DIAGNOSIS — K589 Irritable bowel syndrome without diarrhea: Secondary | ICD-10-CM | POA: Diagnosis not present

## 2022-09-15 DIAGNOSIS — Z4789 Encounter for other orthopedic aftercare: Secondary | ICD-10-CM | POA: Diagnosis not present

## 2022-09-15 DIAGNOSIS — E785 Hyperlipidemia, unspecified: Secondary | ICD-10-CM | POA: Diagnosis not present

## 2022-09-15 DIAGNOSIS — F419 Anxiety disorder, unspecified: Secondary | ICD-10-CM | POA: Diagnosis not present

## 2022-09-15 DIAGNOSIS — G4733 Obstructive sleep apnea (adult) (pediatric): Secondary | ICD-10-CM | POA: Diagnosis not present

## 2022-09-15 DIAGNOSIS — F341 Dysthymic disorder: Secondary | ICD-10-CM | POA: Diagnosis not present

## 2022-09-15 DIAGNOSIS — M47817 Spondylosis without myelopathy or radiculopathy, lumbosacral region: Secondary | ICD-10-CM | POA: Diagnosis not present

## 2022-09-15 DIAGNOSIS — M5136 Other intervertebral disc degeneration, lumbar region: Secondary | ICD-10-CM | POA: Diagnosis not present

## 2022-09-21 ENCOUNTER — Encounter (INDEPENDENT_AMBULATORY_CARE_PROVIDER_SITE_OTHER): Payer: Self-pay | Admitting: Family Medicine

## 2022-09-21 ENCOUNTER — Ambulatory Visit (INDEPENDENT_AMBULATORY_CARE_PROVIDER_SITE_OTHER): Payer: BC Managed Care – PPO | Admitting: Family Medicine

## 2022-09-21 VITALS — BP 110/70 | HR 86 | Temp 98.0°F | Ht 63.0 in | Wt 248.0 lb

## 2022-09-21 DIAGNOSIS — E1169 Type 2 diabetes mellitus with other specified complication: Secondary | ICD-10-CM | POA: Diagnosis not present

## 2022-09-21 DIAGNOSIS — Z6841 Body Mass Index (BMI) 40.0 and over, adult: Secondary | ICD-10-CM

## 2022-09-21 DIAGNOSIS — E669 Obesity, unspecified: Secondary | ICD-10-CM | POA: Diagnosis not present

## 2022-09-21 DIAGNOSIS — Z7985 Long-term (current) use of injectable non-insulin antidiabetic drugs: Secondary | ICD-10-CM

## 2022-09-21 MED ORDER — SEMAGLUTIDE (1 MG/DOSE) 4 MG/3ML ~~LOC~~ SOPN: 1.0000 mg | PEN_INJECTOR | SUBCUTANEOUS | 1 refills | Status: DC

## 2022-09-22 DIAGNOSIS — Z981 Arthrodesis status: Secondary | ICD-10-CM | POA: Diagnosis not present

## 2022-09-29 DIAGNOSIS — F341 Dysthymic disorder: Secondary | ICD-10-CM | POA: Diagnosis not present

## 2022-09-29 DIAGNOSIS — F4010 Social phobia, unspecified: Secondary | ICD-10-CM | POA: Diagnosis not present

## 2022-10-04 NOTE — Progress Notes (Signed)
Chief Complaint:   OBESITY Lisa Curry is here to discuss her progress with her obesity treatment plan along with follow-up of her obesity related diagnoses. Lisa Curry is on keeping a food journal and adhering to recommended goals of 1300-1500 calories and 95 grams of protein and states she is following her eating plan approximately 0% of the time. Lisa Curry states she is doing physical therapy.    Today's visit was #: 24 Starting weight: 279 lbs Starting date: 07/26/2018 Today's weight: 248 lbs Today's date: 09/21/2022 Total lbs lost to date: 31 Total lbs lost since last in-office visit: 3  Interim History: Lisa Curry has had back surgery and she is still recovering.  She is doing well with portion control and making smarter choices, but her protein is low.  Subjective:   1. Type 2 diabetes mellitus with other specified complication, without long-term current use of insulin ([place]) Lisa Curry is stable on Victoza, and her A1c last month was at 5.6.  She denies hypoglycemia.  2. Hypocalcemia Lisa Curry's postop calcium was below goal and she has questions about this.  Assessment/Plan:   1. Type 2 diabetes mellitus with other specified complication, without long-term current use of insulin ([place]) [name] will continue Ozempic, and we will refill for 2 months.  - Semaglutide, 1 MG/DOSE, 4 MG/3ML SOPN; Inject 1 mg as directed once a week.  Dispense: 3 mL; Refill: 1  2. Hypocalcemia Lisa Curry was advised that it could be low due to her IV fluids.  We will recheck at her next visit.  3. Obesity, Current BMI 44.1 Lisa Curry is currently in the action stage of change. As such, her goal is to continue with weight loss efforts. She has agreed to the Category 2 Plan.   Protein supplements were discussed. Ok to use now but getting back to "real food" protein is the goal.   Exercise goals: As is.   Behavioral modification strategies: increasing lean protein intake.  Lisa Curry has agreed to follow-up with our clinic in 8 weeks. She was  informed of the importance of frequent follow-up visits to maximize her success with intensive lifestyle modifications for her multiple health conditions.   Objective:   Blood pressure 110/70, pulse 86, temperature 98 F (36.7 C), height '5\' 3"'$  (1.6 m), weight 248 lb (112.5 kg), SpO2 97 %. Body mass index is 43.93 kg/m.  General: Cooperative, alert, well developed, in no acute distress. HEENT: Conjunctivae and lids unremarkable. Cardiovascular: Regular rhythm.  Lungs: Normal work of breathing. Neurologic: No focal deficits.   Lab Results  Component Value Date   CREATININE 0.73 07/06/2022   BUN 11 07/06/2022   NA 138 07/06/2022   K 4.1 07/06/2022   CL 100 07/06/2022   CO2 22 07/06/2022   Lab Results  Component Value Date   ALT 16 07/06/2022   AST 19 07/06/2022   ALKPHOS 145 (H) 07/06/2022   BILITOT 0.4 07/06/2022   Lab Results  Component Value Date   HGBA1C 5.7 (H) 07/06/2022   HGBA1C 5.7 09/17/2021   HGBA1C 5.4 06/01/2021   HGBA1C 5.5 12/30/2020   HGBA1C 6.0 (H) 06/16/2020   Lab Results  Component Value Date   INSULIN 8.9 07/06/2022   INSULIN 5.4 06/01/2021   INSULIN 12.0 06/16/2020   INSULIN 9.4 12/10/2019   INSULIN 9.8 11/28/2018   Lab Results  Component Value Date   TSH 1.850 07/06/2022   Lab Results  Component Value Date   CHOL 152 07/06/2022   HDL 57 07/06/2022   LDLCALC 73  07/06/2022   LDLDIRECT 73.0 04/18/2017   TRIG 124 07/06/2022   CHOLHDL 3 09/17/2021   Lab Results  Component Value Date   VD25OH 56.7 07/06/2022   VD25OH 46.89 09/17/2021   VD25OH 60.0 06/01/2021   Lab Results  Component Value Date   WBC 8.6 07/06/2022   HGB 14.8 07/06/2022   HCT 43.4 07/06/2022   MCV 90 07/06/2022   PLT 288 07/06/2022   Lab Results  Component Value Date   IRON 62 05/30/2016   FERRITIN 55.2 05/30/2016   Attestation Statements:   Reviewed by clinician on day of visit: allergies, medications, problem list, medical history, surgical history, family  history, social history, and previous encounter notes.   I, Trixie Dredge, am acting as transcriptionist for Dennard Nip, MD.  I have reviewed the above documentation for accuracy and completeness, and I agree with the above. -  Dennard Nip, MD

## 2022-10-13 DIAGNOSIS — F4010 Social phobia, unspecified: Secondary | ICD-10-CM | POA: Diagnosis not present

## 2022-10-13 DIAGNOSIS — F341 Dysthymic disorder: Secondary | ICD-10-CM | POA: Diagnosis not present

## 2022-10-27 DIAGNOSIS — F4010 Social phobia, unspecified: Secondary | ICD-10-CM | POA: Diagnosis not present

## 2022-10-27 DIAGNOSIS — F341 Dysthymic disorder: Secondary | ICD-10-CM | POA: Diagnosis not present

## 2022-11-02 DIAGNOSIS — M48062 Spinal stenosis, lumbar region with neurogenic claudication: Secondary | ICD-10-CM | POA: Diagnosis not present

## 2022-11-10 DIAGNOSIS — R531 Weakness: Secondary | ICD-10-CM | POA: Diagnosis not present

## 2022-11-10 DIAGNOSIS — M5440 Lumbago with sciatica, unspecified side: Secondary | ICD-10-CM | POA: Diagnosis not present

## 2022-11-11 ENCOUNTER — Ambulatory Visit (INDEPENDENT_AMBULATORY_CARE_PROVIDER_SITE_OTHER): Payer: BC Managed Care – PPO | Admitting: Family Medicine

## 2022-11-11 ENCOUNTER — Encounter: Payer: Self-pay | Admitting: Family Medicine

## 2022-11-11 VITALS — BP 136/74 | HR 86 | Temp 97.3°F | Ht 63.0 in | Wt 253.5 lb

## 2022-11-11 DIAGNOSIS — E1169 Type 2 diabetes mellitus with other specified complication: Secondary | ICD-10-CM

## 2022-11-11 DIAGNOSIS — R140 Abdominal distension (gaseous): Secondary | ICD-10-CM | POA: Diagnosis not present

## 2022-11-11 DIAGNOSIS — E7849 Other hyperlipidemia: Secondary | ICD-10-CM | POA: Diagnosis not present

## 2022-11-11 MED ORDER — ATORVASTATIN CALCIUM 20 MG PO TABS: ORAL_TABLET | ORAL | 3 refills | Status: DC

## 2022-11-11 MED ORDER — OMEPRAZOLE 40 MG PO CPDR: 40.0000 mg | DELAYED_RELEASE_CAPSULE | Freq: Every day | ORAL | 3 refills | Status: DC

## 2022-11-11 NOTE — Assessment & Plan Note (Addendum)
On ozempic '1mg'$  [facility].  Check Umicroalb today.

## 2022-11-11 NOTE — Assessment & Plan Note (Signed)
Atorvastatin refilled. The 10-year ASCVD risk score (Arnett DK, et al., 2019) is: 2.2%   Values used to calculate the score:     Age: 52 years     Sex: Female     Is Non-Hispanic African American: No     Diabetic: Yes     Tobacco smoker: No     Systolic Blood Pressure: [id number] mmHg     Is BP treated: No     HDL Cholesterol: 57 mg/dL     Total Cholesterol: 152 mg/dL

## 2022-11-11 NOTE — Patient Instructions (Addendum)
Urine test today  Schedule physical for after 12/11/2022.  Good to see you today.  Start omeprazole '40mg'$  daily - 30 min prior to larger meal. Let us know if not improving.

## 2022-11-11 NOTE — Assessment & Plan Note (Signed)
1 wk h/o belching, gassiness in the mornings, temporally related to when she restarted ibuprofen '800mg'$  nightly. Discussed interaction of SSRI with NSAIDs and increased bleeding risk, also GLP1RA may contribute to symptoms.  Rec start omeprazole '40mg'$  daily, discussed GasX use, update with effect.

## 2022-11-11 NOTE — Progress Notes (Signed)
Patient ID: Lisa Curry, female    DOB: July 11, 1971, 52 y.o.   MRN: [id number]  This visit was conducted in person.  BP 136/74   Pulse 86   Temp (!) 97.3 F (36.3 C) (Temporal)   Ht '5\' 3"'$  (1.6 m)   Wt 253 lb 8 oz (115 kg)   LMP 06/25/2021   SpO2 99%   BMI 44.91 kg/m    CC: CPE - converted to acute visit as not yet due Subjective:   HPI: Lisa Curry is a 52 y.o. female presenting on 11/11/2022 for Annual Exam (C/o being gassy and belching when getting out of bed in the morning. )   [facility], on ozempic '1mg'$  weekly.   Had back surgery 07/2022 by Dr Patrice Paradise orthopedic surgeon - L2-4 laminectomy and fusion with TLIF, instrumentation, allograft, L1/2 laminectomy for spinal stenosis and herniated discs.   Notes 1 week h/o belching and gassiness worse in the mornings when she wakes up.  She did recently start ibuprofen '800mg'$  nightly after ok by ortho.  In the mornings she's been taking pepto bismol with some benefit.  Ozempic '1mg'$  weekly on Saturdays - no dose change recently.  No diet changes.  No significant GERD symptoms.  She's previously tolerated PPI well - was previously on dexilant with best effect however insurance stopped covering this.  No sick contacts at home. No fevers/chills, abd pain, nausea, vomiting or diarrhea. No dysphagia.   Preventative: Colonoscopy 01/2021 - TA x2, rpt 7 yrs Ardis Hughs) Mammogram: 11/2021 - Birads1 @ [facility]  Well woman exam through PCP - pap smear: 2012, 2015, 2016, 2019, 2023 WNL (absent transformation zone).  LMP 05/2021, previously regular. Doesn't note menopausal symptoms.  DEXA scan - not due Lung cancer screening - not eligible  Flu: yearly COVID vaccine J&J 12/2019, no booster Pneumovax 2015 Tdap 09/2020 Shingrix - discussed, to consider  Seat belt use discussed  Sunscreen use discussed. No changing moles on skin.  Non smoker  Alcohol - rare Eye exam: every other year.  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 Activity: no regular exercise  Diet: good water, fruits/vegetables daily      Relevant past medical, surgical, family and social history reviewed and updated as indicated. Interim medical history since our last visit reviewed. Allergies and medications reviewed and updated. Outpatient Medications Prior to Visit  Medication Sig Dispense Refill   Cholecalciferol (VITAMIN D3) 125 MCG (5000 UT) TABS Take 5,000 Units by mouth daily.     cyclobenzaprine (FLEXERIL) 10 MG tablet TAKE 1 TABLET BY MOUTH 2 (TWO) TIMES DAILY AS NEEDED FOR MUSCLE SPASMS (SEDATION PRECAUTIONS). 30 tablet 1   glucose blood (ONE TOUCH ULTRA TEST) test strip Use to check sugar twice daily and as needed. Dx: E11.65 300 each 3   HYDROcodone-acetaminophen (NORCO/VICODIN) 5-325 MG tablet Take 1 tablet by mouth every 4 (four) hours as needed.     ibuprofen (ADVIL) 200 MG tablet Take 600 mg by mouth every 8 (eight) hours as needed for moderate pain.     lamoTRIgine (LAMICTAL) 150 MG tablet Take 150 mg by mouth at bedtime.     lamoTRIgine (LAMICTAL) 200 MG tablet Take 1 tablet (200 mg total) by mouth daily. 30 tablet 3   Lancet Devices (ONE TOUCH DELICA LANCING DEV) MISC Use as  directed with lancets and one touch meter E11.65 1 each 0   Multiple Vitamin (MULTIVITAMIN) capsule Take 1 capsule by mouth daily.     ONETOUCH DELICA LANCETS 99991111 MISC Check blood sugar twice daily and as directed. Dx E11.65 200 each 0   Semaglutide, 1 MG/DOSE, 4 MG/3ML SOPN Inject 1 mg as directed once a week. 3 mL 1   sertraline (ZOLOFT) 100 MG tablet Take 100 mg by mouth daily.     acetaminophen-codeine (TYLENOL #3) 300-30 MG tablet Take 1 tablet by mouth every 8 (eight) hours as needed for moderate pain. 15 tablet 0   atorvastatin (LIPITOR) 20 MG tablet TAKE 1 TABLET BY MOUTH DAILY AT 6 PM. 90 tablet 3    No facility-administered medications prior to visit.     Per HPI unless specifically indicated in ROS section below Review of Systems  Objective:  BP 136/74   Pulse 86   Temp (!) 97.3 F (36.3 C) (Temporal)   Ht '5\' 3"'$  (1.6 m)   Wt 253 lb 8 oz (115 kg)   LMP 06/25/2021   SpO2 99%   BMI 44.91 kg/m   Wt Readings from Last 3 Encounters:  11/11/22 253 lb 8 oz (115 kg)  09/21/22 248 lb (112.5 kg)  07/06/22 251 lb (113.9 kg)      Physical Exam Vitals and nursing note reviewed.  Constitutional:      Appearance: Normal appearance.     Comments: Wearing back brace  Neurological:     Mental Status: She is alert.       Results for orders placed or performed in visit on 07/06/22  Hemoglobin A1c  Result Value Ref Range   Hgb A1c MFr Bld 5.7 (H) 4.8 - 5.6 %   Est. average glucose Bld gHb Est-mCnc 117 mg/dL  Insulin, random  Result Value Ref Range   INSULIN 8.9 2.6 - 24.9 uIU/mL  CBC with Differential/Platelet  Result Value Ref Range   WBC 8.6 3.4 - 10.8 x10E3/uL   RBC 4.85 3.77 - 5.28 x10E6/uL   Hemoglobin 14.8 11.1 - 15.9 g/dL   Hematocrit 43.4 34.0 - 46.6 %   MCV 90 79 - 97 fL   MCH 30.5 26.6 - 33.0 pg   MCHC 34.1 31.5 - 35.7 g/dL   RDW 12.8 11.7 - 15.4 %   Platelets 288 150 - 450 x10E3/uL   Neutrophils 69 Not Estab. %   Lymphs 25 Not Estab. %   Monocytes 4 Not Estab. %   Eos 2 Not Estab. %   Basos 0 Not Estab. %   Neutrophils Absolute 6.0 1.4 - 7.0 x10E3/uL   Lymphocytes Absolute 2.1 0.7 - 3.1 x10E3/uL   Monocytes Absolute 0.3 0.1 - 0.9 x10E3/uL   EOS (ABSOLUTE) 0.2 0.0 - 0.4 x10E3/uL   Basophils Absolute 0.0 0.0 - 0.2 x10E3/uL   Immature Granulocytes 0 Not Estab. %   Immature Grans (Abs) 0.0 0.0 - 0.1 x10E3/uL  CMP14+EGFR  Result Value Ref Range   Glucose 94 70 - 99 mg/dL   BUN 11 6 - 24 mg/dL   Creatinine, Ser 0.73 0.57 - 1.00 mg/dL   eGFR 99 >59 mL/min/1.73   BUN/Creatinine Ratio 15 9 - 23   Sodium 138 134 - 144 mmol/L   Potassium 4.1 3.5 - 5.2  mmol/L   Chloride 100 96 - 106 mmol/L   CO2 22 20 - 29 mmol/L   Calcium 9.4 8.7 - 10.2 mg/dL   Total Protein 7.0 6.0 - 8.5  g/dL   Albumin 4.6 3.8 - 4.9 g/dL   Globulin, Total 2.4 1.5 - 4.5 g/dL   Albumin/Globulin Ratio 1.9 1.2 - 2.2   Bilirubin Total 0.4 0.0 - 1.2 mg/dL   Alkaline Phosphatase 145 (H) 44 - 121 IU/L   AST 19 0 - 40 IU/L   ALT 16 0 - 32 IU/L  TSH  Result Value Ref Range   TSH 1.850 0.450 - 4.500 uIU/mL  VITAMIN D 25 Hydroxy (Vit-D Deficiency, Fractures)  Result Value Ref Range   Vit D, 25-Hydroxy 56.7 30.0 - 100.0 ng/mL  Lipid Panel With LDL/HDL Ratio  Result Value Ref Range   Cholesterol, Total 152 100 - 199 mg/dL   Triglycerides 124 0 - 149 mg/dL   HDL 57 >39 mg/dL   VLDL Cholesterol Cal 22 5 - 40 mg/dL   LDL Chol Calc (NIH) 73 0 - 99 mg/dL   LDL/HDL Ratio 1.3 0.0 - 3.2 ratio    Assessment & Plan:   Problem List Items Addressed This Visit     HLD (hyperlipidemia)    Atorvastatin refilled. The 10-year ASCVD risk score (Arnett DK, et al., 2019) is: 2.2%   Values used to calculate the score:     Age: 81 years     Sex: Female     Is Non-Hispanic African American: No     Diabetic: Yes     Tobacco smoker: No     Systolic Blood Pressure: XX123456 mmHg     Is BP treated: No     HDL Cholesterol: 57 mg/dL     Total Cholesterol: 152 mg/dL       Relevant Medications   atorvastatin (LIPITOR) 20 MG tablet   Other Relevant Orders   Microalbumin / creatinine urine ratio   Type 2 diabetes mellitus with other specified complication (H[facility])    On ozempic '1mg'$  [facility]  Check Umicroalb today.       Relevant Medications   atorvastatin (LIPITOR) 20 MG tablet   Gassiness - Primary    1 wk h/o belching, gassiness in the mornings, temporally related to when she restarted ibuprofen '800mg'$  nightly. Discussed interaction of SSRI with NSAIDs and increased bleeding risk, also GLP1RA may contribute to symptoms.  Rec start omeprazole '40mg'$   daily, discussed GasX use, update with effect.         Meds ordered this encounter  Medications   DISCONTD: atorvastatin (LIPITOR) 20 MG tablet    Sig: TAKE 1 TABLET BY MOUTH DAILY AT 6 PM.    Dispense:  90 tablet    Refill:  3   DISCONTD: omeprazole (PRILOSEC) 40 MG capsule    Sig: Take 1 capsule (40 mg total) by mouth daily.    Dispense:  30 capsule    Refill:  3   atorvastatin (LIPITOR) 20 MG tablet    Sig: TAKE 1 TABLET BY MOUTH DAILY AT 6 PM.    Dispense:  90 tablet    Refill:  3   omeprazole (PRILOSEC) 40 MG capsule    Sig: Take 1 capsule (40 mg total) by mouth daily.    Dispense:  30 capsule    Refill:  3    Orders Placed This Encounter  Procedures   Microalbumin / creatinine urine ratio    Patient Instructions  Urine test today  Schedule physical for after 12/11/2022.  Good to see you today.  Start omeprazole '40mg'$  daily - 30 min prior to larger meal. Let us know  if not improving.   Follow up plan: Return if symptoms worsen or fail to improve.  Ria Bush, MD

## 2022-11-12 LAB — MICROALBUMIN / CREATININE URINE RATIO
Creatinine, Urine: 33 mg/dL (ref 20–275)
Microalb Creat Ratio: 6 mcg/mg creat (ref <30)
Microalb, Ur: 0.2 mg/dL

## 2022-11-14 DIAGNOSIS — F341 Dysthymic disorder: Secondary | ICD-10-CM | POA: Diagnosis not present

## 2022-11-14 DIAGNOSIS — F4010 Social phobia, unspecified: Secondary | ICD-10-CM | POA: Diagnosis not present

## 2022-11-14 DIAGNOSIS — M5440 Lumbago with sciatica, unspecified side: Secondary | ICD-10-CM | POA: Diagnosis not present

## 2022-11-14 DIAGNOSIS — R531 Weakness: Secondary | ICD-10-CM | POA: Diagnosis not present

## 2022-11-21 DIAGNOSIS — M5440 Lumbago with sciatica, unspecified side: Secondary | ICD-10-CM | POA: Diagnosis not present

## 2022-11-21 DIAGNOSIS — R531 Weakness: Secondary | ICD-10-CM | POA: Diagnosis not present

## 2022-11-23 ENCOUNTER — Ambulatory Visit (INDEPENDENT_AMBULATORY_CARE_PROVIDER_SITE_OTHER): Payer: BC Managed Care – PPO | Admitting: Family Medicine

## 2022-11-23 ENCOUNTER — Encounter (INDEPENDENT_AMBULATORY_CARE_PROVIDER_SITE_OTHER): Payer: Self-pay | Admitting: Family Medicine

## 2022-11-23 VITALS — BP 96/64 | HR 95 | Temp 97.8°F | Ht 63.0 in | Wt 247.0 lb

## 2022-11-23 DIAGNOSIS — E669 Obesity, unspecified: Secondary | ICD-10-CM | POA: Diagnosis not present

## 2022-11-23 DIAGNOSIS — E1169 Type 2 diabetes mellitus with other specified complication: Secondary | ICD-10-CM

## 2022-11-23 DIAGNOSIS — Z6841 Body Mass Index (BMI) 40.0 and over, adult: Secondary | ICD-10-CM

## 2022-11-23 DIAGNOSIS — F3289 Other specified depressive episodes: Secondary | ICD-10-CM | POA: Diagnosis not present

## 2022-11-23 MED ORDER — SEMAGLUTIDE (2 MG/DOSE) 8 MG/3ML ~~LOC~~ SOPN: 2.0000 mg | PEN_INJECTOR | SUBCUTANEOUS | 0 refills | Status: DC

## 2022-11-24 DIAGNOSIS — R531 Weakness: Secondary | ICD-10-CM | POA: Diagnosis not present

## 2022-11-24 DIAGNOSIS — F4010 Social phobia, unspecified: Secondary | ICD-10-CM | POA: Diagnosis not present

## 2022-11-24 DIAGNOSIS — F341 Dysthymic disorder: Secondary | ICD-10-CM | POA: Diagnosis not present

## 2022-11-28 DIAGNOSIS — F39 Unspecified mood [affective] disorder: Secondary | ICD-10-CM | POA: Diagnosis not present

## 2022-11-28 DIAGNOSIS — F411 Generalized anxiety disorder: Secondary | ICD-10-CM | POA: Diagnosis not present

## 2022-11-29 DIAGNOSIS — R531 Weakness: Secondary | ICD-10-CM | POA: Diagnosis not present

## 2022-12-02 DIAGNOSIS — R531 Weakness: Secondary | ICD-10-CM | POA: Diagnosis not present

## 2022-12-02 DIAGNOSIS — M5440 Lumbago with sciatica, unspecified side: Secondary | ICD-10-CM | POA: Diagnosis not present

## 2022-12-05 DIAGNOSIS — R531 Weakness: Secondary | ICD-10-CM | POA: Diagnosis not present

## 2022-12-05 DIAGNOSIS — M5440 Lumbago with sciatica, unspecified side: Secondary | ICD-10-CM | POA: Diagnosis not present

## 2022-12-06 NOTE — Progress Notes (Signed)
Chief Complaint:   OBESITY Lisa Curry is here to discuss her progress with her obesity treatment plan along with follow-up of her obesity related diagnoses. Lisa Curry is on the Category 2 Plan and states she is following her eating plan approximately 50% of the time. [name] states she is doing physical therapy.    Today's visit was #: 58 Starting weight: 279 lbs Starting date: 07/26/2018 Today's weight: 247 lbs Today's date: 11/23/2022 Total lbs lost to date: 32 Total lbs lost since last in-office visit: 1  Interim History: Lisa Curry continues to work on her diet and weight loss.  She notes a lack of support from her husband, but she continues to be mindful of her eating.  Subjective:   1. Type 2 diabetes mellitus with other specified complication, without long-term current use of insulin ([place]) Lisa Curry notes her glucose is elevated, 90 daily, average 119 fasting.  2. Emotional Eating Behavior Lisa Curry notes increased chocolate cravings, worse in the last few weeks.  She notes it is worse in the afternoon.  Assessment/Plan:   1. Type 2 diabetes mellitus with other specified complication, without long-term current use of insulin ([place]) Berlie agreed to increase Ozempic to 2 mg once weekly, and we will refill for 1 month (patient is to increase to 1.5 mg for now).  - Semaglutide, 2 MG/DOSE, 8 MG/3ML SOPN; Inject 2 mg as directed once a week.  Dispense: 3 mL; Refill: 0  2. Emotional Eating Behavior Emotional eating behavior strategies were discussed.  Lisa Curry will continue to work on decreasing emotional eating behaviors.  3. BMI 40.0-44.9, adult ([place])  4. Obesity, Beginning BMI 47.89 [name] is currently in the action stage of change. As such, her goal is to continue with weight loss efforts. She has agreed to the Category 2 Plan.   Exercise goals: As is.   Behavioral modification strategies: emotional eating strategies and dealing with family or coworker sabotage.  Lisa Curry has agreed to follow-up with our  clinic in 8 weeks. She was informed of the importance of frequent follow-up visits to maximize her success with intensive lifestyle modifications for her multiple health conditions.   Objective:   Blood pressure 96/64, pulse 95, temperature 97.8 F (36.6 C), height 5\' 3"  (1.6 m), weight 247 lb (112 kg), last menstrual period 06/25/2021, SpO2 97 %. Body mass index is 43.75 kg/m.  Lab Results  Component Value Date   CREATININE 0.73 07/06/2022   BUN 11 07/06/2022   NA 138 07/06/2022   K 4.1 07/06/2022   CL 100 07/06/2022   CO2 22 07/06/2022   Lab Results  Component Value Date   ALT 16 07/06/2022   AST 19 07/06/2022   ALKPHOS 145 (H) 07/06/2022   BILITOT 0.4 07/06/2022   Lab Results  Component Value Date   HGBA1C 5.7 (H) 07/06/2022   HGBA1C 5.7 09/17/2021   HGBA1C 5.4 06/01/2021   HGBA1C 5.5 12/30/2020   HGBA1C 6.0 (H) 06/16/2020   Lab Results  Component Value Date   INSULIN 8.9 07/06/2022   INSULIN 5.4 06/01/2021   INSULIN 12.0 06/16/2020   INSULIN 9.4 12/10/2019   INSULIN 9.8 11/28/2018   Lab Results  Component Value Date   TSH 1.850 07/06/2022   Lab Results  Component Value Date   CHOL 152 07/06/2022   HDL 57 07/06/2022   LDLCALC 73 07/06/2022   LDLDIRECT 73.0 04/18/2017   TRIG 124 07/06/2022   CHOLHDL 3 09/17/2021   Lab Results  Component Value Date   VD25OH  56.7 07/06/2022   VD25OH 46.89 09/17/2021   VD25OH 60.0 06/01/2021   Lab Results  Component Value Date   WBC 8.6 07/06/2022   HGB 14.8 07/06/2022   HCT 43.4 07/06/2022   MCV 90 07/06/2022   PLT 288 07/06/2022   Lab Results  Component Value Date   IRON 62 05/30/2016   FERRITIN 55.2 05/30/2016   Attestation Statements:   Reviewed by clinician on day of visit: allergies, medications, problem list, medical history, surgical history, family history, social history, and previous encounter notes.   I, Trixie Dredge, am acting as transcriptionist for Dennard Nip, MD.  I have reviewed the  above documentation for accuracy and completeness, and I agree with the above. -  Dennard Nip, MD

## 2022-12-08 DIAGNOSIS — F4010 Social phobia, unspecified: Secondary | ICD-10-CM | POA: Diagnosis not present

## 2022-12-08 DIAGNOSIS — F341 Dysthymic disorder: Secondary | ICD-10-CM | POA: Diagnosis not present

## 2022-12-12 ENCOUNTER — Ambulatory Visit: Payer: BC Managed Care – PPO | Admitting: Internal Medicine

## 2022-12-12 ENCOUNTER — Encounter: Payer: Self-pay | Admitting: Internal Medicine

## 2022-12-12 VITALS — BP 122/80 | HR 86 | Temp 97.6°F | Ht 63.0 in | Wt 251.0 lb

## 2022-12-12 DIAGNOSIS — J020 Streptococcal pharyngitis: Secondary | ICD-10-CM | POA: Diagnosis not present

## 2022-12-12 DIAGNOSIS — J029 Acute pharyngitis, unspecified: Secondary | ICD-10-CM | POA: Diagnosis not present

## 2022-12-12 LAB — POCT RAPID STREP A (OFFICE): Rapid Strep A Screen: POSITIVE — AB

## 2022-12-12 MED ORDER — AMOXICILLIN 500 MG PO TABS: 1000.0000 mg | ORAL_TABLET | Freq: Two times a day (BID) | ORAL | 0 refills | Status: DC

## 2022-12-12 NOTE — Progress Notes (Signed)
Subjective:    Patient ID: Lisa Curry, female    DOB: [birth year], 52 y.o.   MRN: [id number]  HPI Here due to sore throat  Also with cough----green sputum and green nasal discharge Started 5 days ago No fever No chills or sweats Hurts to swallow 73 year old son was sick a couple of weeks ago No SOB Some right ear pain  Using sudafed and gualfenesin Advil Some help  Current Outpatient Medications on File Prior to Visit  Medication Sig Dispense Refill   atorvastatin (LIPITOR) 20 MG tablet TAKE 1 TABLET BY MOUTH DAILY AT 6 PM. 90 tablet 3   Cholecalciferol (VITAMIN D3) 125 MCG (5000 UT) TABS Take 5,000 Units by mouth daily.     cyclobenzaprine (FLEXERIL) 10 MG tablet TAKE 1 TABLET BY MOUTH 2 (TWO) TIMES DAILY AS NEEDED FOR MUSCLE SPASMS (SEDATION PRECAUTIONS). 30 tablet 1   glucose blood (ONE TOUCH ULTRA TEST) test strip Use to check sugar twice daily and as needed. Dx: E11.65 300 each 3   HYDROcodone-acetaminophen (NORCO/VICODIN) 5-325 MG tablet Take 1 tablet by mouth every 4 (four) hours as needed.     ibuprofen (ADVIL) 200 MG tablet Take 600 mg by mouth every 8 (eight) hours as needed for moderate pain.     lamoTRIgine (LAMICTAL) 150 MG tablet Take 150 mg by mouth at bedtime.     lamoTRIgine (LAMICTAL) 200 MG tablet Take 1 tablet (200 mg total) by mouth daily. 30 tablet 3   Lancet Devices (ONE TOUCH DELICA LANCING DEV) MISC Use as directed with lancets and one touch meter E11.65 1 each 0   Multiple Vitamin (MULTIVITAMIN) capsule Take 1 capsule by mouth daily.     omeprazole (PRILOSEC) 40 MG capsule Take 1 capsule (40 mg total) by mouth daily. 30 capsule 3   ONETOUCH DELICA LANCETS 99991111 MISC Check blood sugar twice daily and as directed. Dx E11.65 200 each 0   Semaglutide, 1 MG/DOSE, 4 MG/3ML SOPN Inject 1 mg as directed once a week. 3 mL 1   sertraline (ZOLOFT) 100 MG tablet Take 100 mg by mouth daily.     Semaglutide, 2 MG/DOSE, 8 MG/3ML SOPN Inject 2 mg as directed once a  week. (Patient not taking: Reported on 12/12/2022) 3 mL 0   No current facility-administered medications on file prior to visit.    Allergies  Allergen Reactions   Povidone Iodine Hives   Biaxin [Clarithromycin] Hives   Metformin And Related Diarrhea    Past Medical History:  Diagnosis Date   Allergy    Anemia    Anxiety    Barrett esophagus    Don Bulla, have not received EGD report   Chronic back pain    Constipation    Depression with anxiety    Diabetes mellitus without complication ([place])    DSME 04/2014   Edema, lower extremity    Fatty liver    Gastric polyp 05/2012   h/o EGD   GERD (gastroesophageal reflux disease)    History of Helicobacter pylori infection    HLD (hyperlipidemia)    Hyperlipidemia    Sleep apnea    uses CPAP   Vitamin D deficiency     Past Surgical History:  Procedure Laterality Date   CESAREAN SECTION  6/95, 4/98   COLONOSCOPY  01/06/2021   TA x2, rpt 7 yrs Ardis Hughs)   [place] OF UTERUS  2011   ESOPHAGOGASTRODUODENOSCOPY  2012?   [facility] L[facility] at [facility]  ESOPHAGOGASTRODUODENOSCOPY  10/2017   WNL Ardis Hughs)   [place] SURGERY  03/1995   NASAL SINUS SURGERY  [id number]   UMBILICAL HERNIA REPAIR N/A 01/08/2021   OPEN PRIMARY REPAIR OF INCARCERATED UMBILICAL HERNIA;  Harlow Asa, Todd, MD)   WISDOM TOOTH EXTRACTION      Family History  Problem Relation Age of Onset   Breast cancer Maternal Grandmother 40   Dementia Maternal Grandmother    Heart disease Paternal Grandfather    Other Sister        Postural Orthostatic Tachycardia Syndrome   Supraventricular tachycardia Sister    Other Maternal Aunt        hysterectomy for irregular bleed with abnormal pap   Hyperlipidemia Mother    CAD Father        5 stents placed   Alzheimer's disease Maternal Grandfather    Colon cancer Neg Hx    Colon polyps Neg Hx    Esophageal cancer Neg Hx    Stomach cancer Neg Hx    Rectal cancer Neg Hx     Social History    Socioeconomic History   Marital status: Married    Spouse name: W. Roseana Manzanares   Number of children: 2   Years of education: Not on file   Highest education level: Bachelor's degree (e.g., BA, AB, BS)  Occupational History   Occupation: Pharmacist, hospital, care giver  Tobacco Use   Smoking status: Never   Smokeless tobacco: Never  Vaping Use   Vaping Use: Never used  Substance and Sexual Activity   Alcohol use: Yes    Alcohol/week: 0.0 standard drinks of alcohol    Comment: occasional   Drug use: No   Sexual activity: Not on file  Other Topics Concern   Not on file  Social History Narrative   Lives with husband and 2 children, 2 dogs, 2 cats and 4 ducks and 16 chickens and a fish   Occ: special ed substitute Pharmacist, hospital.   Activity: no regular exercise   Diet: good water, fruits/vegetables daily   Right handed   Caffeine: daily   Social Determinants of Health   Financial Resource Strain: Not on file  Food Insecurity: Not on file  Transportation Needs: Not on file  Physical Activity: Not on file  Stress: Not on file  Social Connections: Not on file  Intimate Partner Violence: Not on file   Review of Systems No rash or vomiting Left eye matted this morning--not red Eating okay    Objective:   Physical Exam Constitutional:      Appearance: Normal appearance.  HENT:     Head:     Comments: Mild right maxillary tenderness    Right Ear: Tympanic membrane and ear canal normal.     Left Ear: Tympanic membrane and ear canal normal.     Mouth/Throat:     Comments: Slight pharyngeal redness without exudates Neck:     Comments: Small non tender anterior cervical nodes Pulmonary:     Effort: Pulmonary effort is normal.     Breath sounds: Normal breath sounds. No wheezing or rales.  Musculoskeletal:     Cervical back: Neck supple.  Neurological:     Mental Status: She is alert.            Assessment & Plan:

## 2022-12-12 NOTE — Assessment & Plan Note (Signed)
Rapid test positive but symptoms more consistent with sinusitis Will treat with amoxil 1000 bid x 10 days Okay to continue advil and decongestants prn

## 2022-12-17 ENCOUNTER — Encounter (INDEPENDENT_AMBULATORY_CARE_PROVIDER_SITE_OTHER): Payer: Self-pay | Admitting: Family Medicine

## 2022-12-17 DIAGNOSIS — E1169 Type 2 diabetes mellitus with other specified complication: Secondary | ICD-10-CM

## 2022-12-19 DIAGNOSIS — M5440 Lumbago with sciatica, unspecified side: Secondary | ICD-10-CM | POA: Diagnosis not present

## 2022-12-19 DIAGNOSIS — R531 Weakness: Secondary | ICD-10-CM | POA: Diagnosis not present

## 2022-12-19 NOTE — Telephone Encounter (Signed)
Please refill x 1  

## 2022-12-20 ENCOUNTER — Other Ambulatory Visit (INDEPENDENT_AMBULATORY_CARE_PROVIDER_SITE_OTHER): Payer: Self-pay | Admitting: Family Medicine

## 2022-12-20 ENCOUNTER — Encounter (INDEPENDENT_AMBULATORY_CARE_PROVIDER_SITE_OTHER): Payer: Self-pay

## 2022-12-20 ENCOUNTER — Other Ambulatory Visit (INDEPENDENT_AMBULATORY_CARE_PROVIDER_SITE_OTHER): Payer: Self-pay

## 2022-12-20 ENCOUNTER — Telehealth (INDEPENDENT_AMBULATORY_CARE_PROVIDER_SITE_OTHER): Payer: Self-pay | Admitting: Family Medicine

## 2022-12-20 DIAGNOSIS — E1169 Type 2 diabetes mellitus with other specified complication: Secondary | ICD-10-CM

## 2022-12-20 MED ORDER — SEMAGLUTIDE (2 MG/DOSE) 8 MG/3ML ~~LOC~~ SOPN
2.0000 mg | PEN_INJECTOR | SUBCUTANEOUS | 1 refills | Status: DC
Start: 2022-12-20 — End: 2023-01-18

## 2022-12-20 MED ORDER — SEMAGLUTIDE (2 MG/DOSE) 8 MG/3ML ~~LOC~~ SOPN
1.0000 mg | PEN_INJECTOR | SUBCUTANEOUS | 0 refills | Status: DC
Start: 2022-12-20 — End: 2022-12-20

## 2022-12-20 MED ORDER — SEMAGLUTIDE (2 MG/DOSE) 8 MG/3ML ~~LOC~~ SOPN: 2.0000 mg | PEN_INJECTOR | SUBCUTANEOUS | 0 refills | Status: DC

## 2022-12-20 MED ORDER — SEMAGLUTIDE (2 MG/DOSE) 8 MG/3ML ~~LOC~~ SOPN
2.0000 mg | PEN_INJECTOR | SUBCUTANEOUS | 0 refills | Status: DC
Start: 2022-12-20 — End: 2022-12-20

## 2022-12-20 NOTE — Telephone Encounter (Signed)
I added a rf to her medication

## 2022-12-20 NOTE — Telephone Encounter (Signed)
Patient has called in concerned about script not being sent to correct pharmacy.   I have apologized in regard.    Patient also states she has had a conversation with Dr. Leafy Ro in regard to ozempic scripts always having 1 refill due to visits only being every 8 weeks.  Patient states she does not have a good support system at home.  States spouse does not support her coming to the program.  States this was an accommodation made by Dr. Leafy Ro.    Please advise.

## 2022-12-20 NOTE — Progress Notes (Signed)
LAST APPOINTMENT DATE: 11/23/2022  NEXT APPOINTMENT DATE: 01/18/2023   [id number] [place], [place] - [address] [place] [address] Phone: [phone] Fax: [place], [place] - [place] [address] Phone: [phone] Fax: [phone]  Patient is requesting a refill of the following medications: Requested Prescriptions   Signed Prescriptions Disp Refills   Semaglutide, 2 MG/DOSE, 8 MG/3ML SOPN 3 mL 0    Sig: Inject 1 mg as directed once a week.    Date last filled: 09/21/22 Previously prescribed by Dennard Nip D, MD  Lab Results  Component Value Date   HGBA1C 5.7 (H) 07/06/2022   HGBA1C 5.7 09/17/2021   HGBA1C 5.4 06/01/2021   Lab Results  Component Value Date   MICROALBUR 0.2 11/11/2022   LDLCALC 73 07/06/2022   CREATININE 0.73 07/06/2022   Lab Results  Component Value Date   VD25OH 56.7 07/06/2022   VD25OH 46.89 09/17/2021   VD25OH 60.0 06/01/2021    BP Readings from Last 3 Encounters:  12/12/22 122/80  11/23/22 96/64  11/11/22 136/74

## 2022-12-20 NOTE — Progress Notes (Signed)
LAST APPOINTMENT DATE: 11/23/22 NEXT APPOINTMENT DATE: 01/18/23   [id number] [place], [place] - [address] [place] [address] Phone: [phone] Fax: [phone]  [place], [place] - [place] [address] Phone: [phone] Fax: [phone]  Patient is requesting a refill of the following medications: Requested Prescriptions   Signed Prescriptions Disp Refills   Semaglutide, 2 MG/DOSE, 8 MG/3ML SOPN 3 mL 0    Sig: Inject 2 mg as directed once a week.    Date last filled: 09/21/22 Previously prescribed by Dennard Nip, MD  Lab Results  Component Value Date   HGBA1C 5.7 (H) 07/06/2022   HGBA1C 5.7 09/17/2021   HGBA1C 5.4 06/01/2021   Lab Results  Component Value Date   MICROALBUR 0.2 11/11/2022   LDLCALC 73 07/06/2022   CREATININE 0.73 07/06/2022   Lab Results  Component Value Date   VD25OH 56.7 07/06/2022   VD25OH 46.89 09/17/2021   VD25OH 60.0 06/01/2021    BP Readings from Last 3 Encounters:  12/12/22 122/80  11/23/22 96/64  11/11/22 136/74

## 2022-12-22 DIAGNOSIS — F341 Dysthymic disorder: Secondary | ICD-10-CM | POA: Diagnosis not present

## 2022-12-22 DIAGNOSIS — F4010 Social phobia, unspecified: Secondary | ICD-10-CM | POA: Diagnosis not present

## 2022-12-28 DIAGNOSIS — M48062 Spinal stenosis, lumbar region with neurogenic claudication: Secondary | ICD-10-CM | POA: Diagnosis not present

## 2022-12-28 DIAGNOSIS — M4326 Fusion of spine, lumbar region: Secondary | ICD-10-CM | POA: Diagnosis not present

## 2023-01-06 DIAGNOSIS — F341 Dysthymic disorder: Secondary | ICD-10-CM | POA: Diagnosis not present

## 2023-01-06 DIAGNOSIS — F4010 Social phobia, unspecified: Secondary | ICD-10-CM | POA: Diagnosis not present

## 2023-01-09 DIAGNOSIS — E119 Type 2 diabetes mellitus without complications: Secondary | ICD-10-CM | POA: Diagnosis not present

## 2023-01-18 ENCOUNTER — Encounter (INDEPENDENT_AMBULATORY_CARE_PROVIDER_SITE_OTHER): Payer: Self-pay | Admitting: Family Medicine

## 2023-01-18 ENCOUNTER — Ambulatory Visit (INDEPENDENT_AMBULATORY_CARE_PROVIDER_SITE_OTHER): Payer: BC Managed Care – PPO | Admitting: Family Medicine

## 2023-01-18 VITALS — BP 113/68 | HR 75 | Temp 98.0°F | Ht 64.0 in | Wt 244.0 lb

## 2023-01-18 DIAGNOSIS — E1169 Type 2 diabetes mellitus with other specified complication: Secondary | ICD-10-CM

## 2023-01-18 DIAGNOSIS — E669 Obesity, unspecified: Secondary | ICD-10-CM | POA: Diagnosis not present

## 2023-01-18 DIAGNOSIS — Z7985 Long-term (current) use of injectable non-insulin antidiabetic drugs: Secondary | ICD-10-CM

## 2023-01-18 DIAGNOSIS — Z6841 Body Mass Index (BMI) 40.0 and over, adult: Secondary | ICD-10-CM | POA: Diagnosis not present

## 2023-01-18 MED ORDER — SEMAGLUTIDE (2 MG/DOSE) 8 MG/3ML ~~LOC~~ SOPN
2.0000 mg | PEN_INJECTOR | SUBCUTANEOUS | 1 refills | Status: DC
Start: 2023-01-18 — End: 2023-01-18

## 2023-01-18 MED ORDER — SEMAGLUTIDE (2 MG/DOSE) 8 MG/3ML ~~LOC~~ SOPN: 2.0000 mg | PEN_INJECTOR | SUBCUTANEOUS | 1 refills | Status: DC

## 2023-01-19 DIAGNOSIS — F4010 Social phobia, unspecified: Secondary | ICD-10-CM | POA: Diagnosis not present

## 2023-01-19 DIAGNOSIS — F341 Dysthymic disorder: Secondary | ICD-10-CM | POA: Diagnosis not present

## 2023-01-23 NOTE — Progress Notes (Signed)
Chief Complaint:   OBESITY Lisa Curry is here to discuss her progress with her obesity treatment plan along with follow-up of her obesity related diagnoses. Lisa Curry is on the Category 2 Plan and states she is following her eating plan approximately 50% of the time. Lisa Curry states she is walking and lifting weights for 60-90 minutes 3-4 times per week.  Today's visit was #: 45 Starting weight: 279 lbs Starting date: 07/26/2018 Today's weight: 244 lbs Today's date: 01/18/2023 Total lbs lost to date: 35 Total lbs lost since last in-office visit: 3  Interim History: Lisa Curry continues to work on her weight loss. She has had extra challenges with caring for family members. She has increased her exercises. She would like to spread out her visits to 2 months.   Subjective:   1. Type 2 diabetes mellitus with other specified complication, without long-term current use of insulin (HCC) Lisa Curry is on Ozempic at 1.5 mg per week. She is not meeting her protein goals regularly yet, and this may decrease her RMR and contribute to sarcopenia.   Assessment/Plan:   1. Type 2 diabetes mellitus with other specified complication, without long-term current use of insulin (HCC) We will refill Ozempic 2 mg with 1 refill, and she will work on her diet and exercise.   - Semaglutide, 2 MG/DOSE, 8 MG/3ML SOPN; Inject 2 mg as directed once a week.  Dispense: 3 mL; Refill: 1  2. Obesity, Beginning BMI 47.89  3. Obesity, with current BMI 43.2 Lisa Curry is currently in the action stage of change. As such, her goal is to continue with weight loss efforts. She has agreed to the Category 2 Plan.   Exercise goals: As is.   Behavioral modification strategies: increasing lean protein intake.  Lisa Curry has agreed to follow-up with our clinic in 6 to 8 weeks. She was informed of the importance of frequent follow-up visits to maximize her success with intensive lifestyle modifications for her multiple health conditions.   Objective:    Blood pressure 113/68, pulse 75, temperature 98 F (36.7 C), height 5\' 4"  (1.626 m), weight 244 lb (110.7 kg), SpO2 99 %. Body mass index is 41.88 kg/m.  Lab Results  Component Value Date   CREATININE 0.73 07/06/2022   BUN 11 07/06/2022   NA 138 07/06/2022   K 4.1 07/06/2022   CL 100 07/06/2022   CO2 22 07/06/2022   Lab Results  Component Value Date   ALT 16 07/06/2022   AST 19 07/06/2022   ALKPHOS 145 (H) 07/06/2022   BILITOT 0.4 07/06/2022   Lab Results  Component Value Date   HGBA1C 5.7 (H) 07/06/2022   HGBA1C 5.7 09/17/2021   HGBA1C 5.4 06/01/2021   HGBA1C 5.5 12/30/2020   HGBA1C 6.0 (H) 06/16/2020   Lab Results  Component Value Date   INSULIN 8.9 07/06/2022   INSULIN 5.4 06/01/2021   INSULIN 12.0 06/16/2020   INSULIN 9.4 12/10/2019   INSULIN 9.8 11/28/2018   Lab Results  Component Value Date   TSH 1.850 07/06/2022   Lab Results  Component Value Date   CHOL 152 07/06/2022   HDL 57 07/06/2022   LDLCALC 73 07/06/2022   LDLDIRECT 73.0 04/18/2017   TRIG 124 07/06/2022   CHOLHDL 3 09/17/2021   Lab Results  Component Value Date   VD25OH 56.7 07/06/2022   VD25OH 46.89 09/17/2021   VD25OH 60.0 06/01/2021   Lab Results  Component Value Date   WBC 8.6 07/06/2022   HGB 14.8 07/06/2022  HCT 43.4 07/06/2022   MCV 90 07/06/2022   PLT 288 07/06/2022   Lab Results  Component Value Date   IRON 62 05/30/2016   FERRITIN 55.2 05/30/2016   Attestation Statements:   Reviewed by clinician on day of visit: allergies, medications, problem list, medical history, surgical history, family history, social history, and previous encounter notes.  Time spent on visit including pre-visit chart review and post-visit care and charting was 30 minutes.   I, Burt Knack, am acting as transcriptionist for Quillian Quince, MD.  I have reviewed the above documentation for accuracy and completeness, and I agree with the above. -  Quillian Quince, MD

## 2023-01-31 DIAGNOSIS — E8941 Symptomatic postprocedural ovarian failure: Secondary | ICD-10-CM | POA: Diagnosis not present

## 2023-01-31 DIAGNOSIS — N939 Abnormal uterine and vaginal bleeding, unspecified: Secondary | ICD-10-CM | POA: Diagnosis not present

## 2023-02-06 DIAGNOSIS — N939 Abnormal uterine and vaginal bleeding, unspecified: Secondary | ICD-10-CM | POA: Diagnosis not present

## 2023-02-06 DIAGNOSIS — N926 Irregular menstruation, unspecified: Secondary | ICD-10-CM | POA: Diagnosis not present

## 2023-02-16 ENCOUNTER — Telehealth (INDEPENDENT_AMBULATORY_CARE_PROVIDER_SITE_OTHER): Payer: Self-pay | Admitting: Adult Health

## 2023-02-16 DIAGNOSIS — F411 Generalized anxiety disorder: Secondary | ICD-10-CM | POA: Diagnosis not present

## 2023-02-16 DIAGNOSIS — F39 Unspecified mood [affective] disorder: Secondary | ICD-10-CM | POA: Diagnosis not present

## 2023-02-16 NOTE — Telephone Encounter (Signed)
PA submitted for Ozempic with a response that said it wasn't needed. 02/16/23

## 2023-02-24 DIAGNOSIS — F341 Dysthymic disorder: Secondary | ICD-10-CM | POA: Diagnosis not present

## 2023-02-24 DIAGNOSIS — F4010 Social phobia, unspecified: Secondary | ICD-10-CM | POA: Diagnosis not present

## 2023-03-01 ENCOUNTER — Ambulatory Visit (INDEPENDENT_AMBULATORY_CARE_PROVIDER_SITE_OTHER): Payer: BC Managed Care – PPO | Admitting: Family Medicine

## 2023-03-14 ENCOUNTER — Encounter (INDEPENDENT_AMBULATORY_CARE_PROVIDER_SITE_OTHER): Payer: Self-pay | Admitting: Family Medicine

## 2023-03-14 ENCOUNTER — Ambulatory Visit (INDEPENDENT_AMBULATORY_CARE_PROVIDER_SITE_OTHER): Payer: BC Managed Care – PPO | Admitting: Family Medicine

## 2023-03-14 VITALS — BP 132/73 | HR 74 | Temp 98.1°F | Ht 64.0 in | Wt 243.0 lb

## 2023-03-14 DIAGNOSIS — Z7985 Long-term (current) use of injectable non-insulin antidiabetic drugs: Secondary | ICD-10-CM | POA: Diagnosis not present

## 2023-03-14 DIAGNOSIS — E1169 Type 2 diabetes mellitus with other specified complication: Secondary | ICD-10-CM | POA: Diagnosis not present

## 2023-03-14 DIAGNOSIS — Z6841 Body Mass Index (BMI) 40.0 and over, adult: Secondary | ICD-10-CM | POA: Diagnosis not present

## 2023-03-14 DIAGNOSIS — E669 Obesity, unspecified: Secondary | ICD-10-CM

## 2023-03-14 MED ORDER — SEMAGLUTIDE (2 MG/DOSE) 8 MG/3ML ~~LOC~~ SOPN: 2.0000 mg | PEN_INJECTOR | SUBCUTANEOUS | 0 refills | Status: DC

## 2023-03-15 NOTE — Progress Notes (Signed)
Chief Complaint:   OBESITY Lisa Curry is here to discuss her progress with her obesity treatment plan along with follow-up of her obesity related diagnoses. Lisa Curry is on the Category 2 Plan and states she is following her eating plan approximately 60% of the time. Lisa Curry states she is weight lifting and using seated stairs for 60 minutes 3 times per week.  Today's visit was #: 46 Starting weight: 279 lbs Starting date: 07/26/2018 Today's weight: 243 lbs Today's date: 03/14/2023 Total lbs lost to date: 36 Total lbs lost since last in-office visit: 1  Interim History: Marquita has been working on journaling more, but she is struggling with some of the advanced ways to calculate recipes etc.  Subjective:   1. Type 2 diabetes mellitus with other specified complication, without long-term current use of insulin (HCC) Patient is on Ozempic, and she is working on her diet and weight loss to help control her diabetes mellitus.  She denies nausea or vomiting.  Assessment/Plan:   1. Type 2 diabetes mellitus with other specified complication, without long-term current use of insulin (HCC) Patient will continue Ozempic 2 mg once weekly, and we will refill for 1 month.  - Semaglutide, 2 MG/DOSE, 8 MG/3ML SOPN; Inject 2 mg as directed once a week.  Dispense: 3 mL; Refill: 0  2. BMI 40.0-44.9, adult (HCC)  3. Obesity, Beginning BMI 47.89 Maureena is currently in the action stage of change. As such, her goal is to continue with weight loss efforts. She has agreed to keeping a food journal and adhering to recommended goals of 1200-1400 calories and 80+ grams of protein daily.   We discussed how to journal recipes on my fitness pal.  Exercise goals: As is.   Behavioral modification strategies: keeping a strict food journal.  Ebonee has agreed to follow-up with our clinic in 6 weeks. She was informed of the importance of frequent follow-up visits to maximize her success with intensive lifestyle modifications for  her multiple health conditions.   Objective:   Blood pressure 132/73, pulse 74, temperature 98.1 F (36.7 C), height 5\' 4"  (1.626 m), weight 243 lb (110.2 kg), SpO2 97 %. Body mass index is 41.71 kg/m.  Lab Results  Component Value Date   CREATININE 0.73 07/06/2022   BUN 11 07/06/2022   NA 138 07/06/2022   K 4.1 07/06/2022   CL 100 07/06/2022   CO2 22 07/06/2022   Lab Results  Component Value Date   ALT 16 07/06/2022   AST 19 07/06/2022   ALKPHOS 145 (H) 07/06/2022   BILITOT 0.4 07/06/2022   Lab Results  Component Value Date   HGBA1C 5.7 (H) 07/06/2022   HGBA1C 5.7 09/17/2021   HGBA1C 5.4 06/01/2021   HGBA1C 5.5 12/30/2020   HGBA1C 6.0 (H) 06/16/2020   Lab Results  Component Value Date   INSULIN 8.9 07/06/2022   INSULIN 5.4 06/01/2021   INSULIN 12.0 06/16/2020   INSULIN 9.4 12/10/2019   INSULIN 9.8 11/28/2018   Lab Results  Component Value Date   TSH 1.850 07/06/2022   Lab Results  Component Value Date   CHOL 152 07/06/2022   HDL 57 07/06/2022   LDLCALC 73 07/06/2022   LDLDIRECT 73.0 04/18/2017   TRIG 124 07/06/2022   CHOLHDL 3 09/17/2021   Lab Results  Component Value Date   VD25OH 56.7 07/06/2022   VD25OH 46.89 09/17/2021   VD25OH 60.0 06/01/2021   Lab Results  Component Value Date   WBC 8.6 07/06/2022  HGB 14.8 07/06/2022   HCT 43.4 07/06/2022   MCV 90 07/06/2022   PLT 288 07/06/2022   Lab Results  Component Value Date   IRON 62 05/30/2016   FERRITIN 55.2 05/30/2016   Attestation Statements:   Reviewed by clinician on day of visit: allergies, medications, problem list, medical history, surgical history, family history, social history, and previous encounter notes.  Time spent on visit including pre-visit chart review and post-visit care and charting was 30 minutes.   I, Burt Knack, am acting as transcriptionist for Quillian Quince, MD.  I have reviewed the above documentation for accuracy and completeness, and I agree with the  above. -  Quillian Quince, MD

## 2023-03-16 ENCOUNTER — Encounter (INDEPENDENT_AMBULATORY_CARE_PROVIDER_SITE_OTHER): Payer: Self-pay | Admitting: Family Medicine

## 2023-03-16 DIAGNOSIS — F341 Dysthymic disorder: Secondary | ICD-10-CM | POA: Diagnosis not present

## 2023-03-16 DIAGNOSIS — F4010 Social phobia, unspecified: Secondary | ICD-10-CM | POA: Diagnosis not present

## 2023-03-31 DIAGNOSIS — F341 Dysthymic disorder: Secondary | ICD-10-CM | POA: Diagnosis not present

## 2023-03-31 DIAGNOSIS — F4010 Social phobia, unspecified: Secondary | ICD-10-CM | POA: Diagnosis not present

## 2023-04-06 ENCOUNTER — Encounter: Payer: Self-pay | Admitting: Family Medicine

## 2023-04-06 DIAGNOSIS — E1169 Type 2 diabetes mellitus with other specified complication: Secondary | ICD-10-CM

## 2023-04-06 MED ORDER — ONETOUCH VERIO VI STRP
ORAL_STRIP | 3 refills | Status: DC
Start: 2023-04-06 — End: 2023-04-14

## 2023-04-06 NOTE — Telephone Encounter (Signed)
E-scribed OneTouch Verio test strips to [facility].

## 2023-04-13 ENCOUNTER — Encounter: Payer: Self-pay | Admitting: Family Medicine

## 2023-04-13 DIAGNOSIS — E1169 Type 2 diabetes mellitus with other specified complication: Secondary | ICD-10-CM

## 2023-04-14 MED ORDER — ONETOUCH VERIO VI STRP
ORAL_STRIP | 11 refills | Status: AC
Start: 2023-04-14 — End: ?

## 2023-04-14 NOTE — Telephone Encounter (Signed)
E-scribed refill to [facility] order pharmacy, per pt request.

## 2023-04-18 DIAGNOSIS — M48062 Spinal stenosis, lumbar region with neurogenic claudication: Secondary | ICD-10-CM | POA: Diagnosis not present

## 2023-04-18 DIAGNOSIS — Z79899 Other long term (current) drug therapy: Secondary | ICD-10-CM | POA: Diagnosis not present

## 2023-04-18 DIAGNOSIS — Z79891 Long term (current) use of opiate analgesic: Secondary | ICD-10-CM | POA: Diagnosis not present

## 2023-04-18 DIAGNOSIS — G894 Chronic pain syndrome: Secondary | ICD-10-CM | POA: Diagnosis not present

## 2023-04-19 DIAGNOSIS — F341 Dysthymic disorder: Secondary | ICD-10-CM | POA: Diagnosis not present

## 2023-04-19 DIAGNOSIS — F4010 Social phobia, unspecified: Secondary | ICD-10-CM | POA: Diagnosis not present

## 2023-04-25 ENCOUNTER — Ambulatory Visit (INDEPENDENT_AMBULATORY_CARE_PROVIDER_SITE_OTHER): Payer: BC Managed Care – PPO | Admitting: Family Medicine

## 2023-04-25 ENCOUNTER — Encounter (INDEPENDENT_AMBULATORY_CARE_PROVIDER_SITE_OTHER): Payer: Self-pay | Admitting: Family Medicine

## 2023-04-25 VITALS — BP 112/74 | HR 78 | Temp 98.0°F | Ht 64.0 in | Wt 241.0 lb

## 2023-04-25 DIAGNOSIS — F3289 Other specified depressive episodes: Secondary | ICD-10-CM | POA: Diagnosis not present

## 2023-04-25 DIAGNOSIS — E1169 Type 2 diabetes mellitus with other specified complication: Secondary | ICD-10-CM | POA: Diagnosis not present

## 2023-04-25 DIAGNOSIS — Z7985 Long-term (current) use of injectable non-insulin antidiabetic drugs: Secondary | ICD-10-CM

## 2023-04-25 DIAGNOSIS — E669 Obesity, unspecified: Secondary | ICD-10-CM

## 2023-04-25 DIAGNOSIS — Z6841 Body Mass Index (BMI) 40.0 and over, adult: Secondary | ICD-10-CM

## 2023-04-25 MED ORDER — SEMAGLUTIDE (2 MG/DOSE) 8 MG/3ML ~~LOC~~ SOPN
2.0000 mg | PEN_INJECTOR | SUBCUTANEOUS | 0 refills | Status: DC
Start: 2023-04-25 — End: 2023-07-11

## 2023-04-25 NOTE — Progress Notes (Unsigned)
Chief Complaint:   OBESITY Lisa Curry is here to discuss her progress with her obesity treatment plan along with follow-up of her obesity related diagnoses. Lisa Curry is on keeping a food journal and adhering to recommended goals of 1200-1400 calories and 80+ grams of protein and states she is following her eating plan approximately 5% of the time. Lisa Curry states she is lifting weights and using the seated stair stepper for 60 minutes 1 time per week.  Today's visit was #: 47 Starting weight: 279 lbs Starting date: 07/26/2018 Today's weight: 141 lbs Today's date: 04/25/2023 Total lbs lost to date: 138 Total lbs lost since last in-office visit: 2  Interim History: Patient continues to work on her weight loss.  She has had extra stressors and has not been able to concentrate on meal planning as much as normal.  Subjective:   1. Type 2 diabetes mellitus with other specified complication, without long-term current use of insulin (HCC) Patient increase Ozempic from 1.5 mg to 2 mg.  She denies nausea or vomiting.  2. Emotional Eating Behavior Patient notes increased stressors with family and her pet.  She is on Zoloft and Lamictal.  Assessment/Plan:   1. Type 2 diabetes mellitus with other specified complication, without long-term current use of insulin (HCC) Patient will continue Ozempic 2 mg once weekly, and we will refill for 90 days.   - Semaglutide, 2 MG/DOSE, 8 MG/3ML SOPN; Inject 2 mg as directed once a week.  Dispense: 9 mL; Refill: 0  2. Emotional Eating Behavior Emotional eating behavior strategies were discussed. We will follow-up in 2 months.   3. BMI 40.0-44.9, adult (HCC)  4. Obesity, Beginning BMI 47.89 Lisa Curry is currently in the action stage of change. As such, her goal is to continue with weight loss efforts. She has agreed to keeping a food journal and adhering to recommended goals of 1200-1400 calories and 80+ grams of protein daily.   Exercise goals: As is.   Behavioral  modification strategies: increasing lean protein intake, increasing vegetables, and no skipping meals.  Lisa Curry has agreed to follow-up with our clinic in 8 weeks. She was informed of the importance of frequent follow-up visits to maximize her success with intensive lifestyle modifications for her multiple health conditions.   Objective:   Blood pressure 112/74, pulse 78, temperature 98 F (36.7 C), height 5\' 4"  (1.626 m), weight 241 lb (109.3 kg), SpO2 97%. Body mass index is 41.37 kg/m.  Lab Results  Component Value Date   CREATININE 0.73 07/06/2022   BUN 11 07/06/2022   NA 138 07/06/2022   K 4.1 07/06/2022   CL 100 07/06/2022   CO2 22 07/06/2022   Lab Results  Component Value Date   ALT 16 07/06/2022   AST 19 07/06/2022   ALKPHOS 145 (H) 07/06/2022   BILITOT 0.4 07/06/2022   Lab Results  Component Value Date   HGBA1C 5.7 (H) 07/06/2022   HGBA1C 5.7 09/17/2021   HGBA1C 5.4 06/01/2021   HGBA1C 5.5 12/30/2020   HGBA1C 6.0 (H) 06/16/2020   Lab Results  Component Value Date   INSULIN 8.9 07/06/2022   INSULIN 5.4 06/01/2021   INSULIN 12.0 06/16/2020   INSULIN 9.4 12/10/2019   INSULIN 9.8 11/28/2018   Lab Results  Component Value Date   TSH 1.850 07/06/2022   Lab Results  Component Value Date   CHOL 152 07/06/2022   HDL 57 07/06/2022   LDLCALC 73 07/06/2022   LDLDIRECT 73.0 04/18/2017   TRIG 124  07/06/2022   CHOLHDL 3 09/17/2021   Lab Results  Component Value Date   VD25OH 56.7 07/06/2022   VD25OH 46.89 09/17/2021   VD25OH 60.0 06/01/2021   Lab Results  Component Value Date   WBC 8.6 07/06/2022   HGB 14.8 07/06/2022   HCT 43.4 07/06/2022   MCV 90 07/06/2022   PLT 288 07/06/2022   Lab Results  Component Value Date   IRON 62 05/30/2016   FERRITIN 55.2 05/30/2016   Attestation Statements:   Reviewed by clinician on day of visit: allergies, medications, problem list, medical history, surgical history, family history, social history, and previous  encounter notes.   I, Lisa Curry, am acting as transcriptionist for Lisa Quince, MD.  I have reviewed the above documentation for accuracy and completeness, and I agree with the above. -  Lisa Quince, MD

## 2023-05-05 ENCOUNTER — Other Ambulatory Visit (INDEPENDENT_AMBULATORY_CARE_PROVIDER_SITE_OTHER): Payer: Self-pay | Admitting: Family Medicine

## 2023-05-05 DIAGNOSIS — E1169 Type 2 diabetes mellitus with other specified complication: Secondary | ICD-10-CM

## 2023-05-08 DIAGNOSIS — F4010 Social phobia, unspecified: Secondary | ICD-10-CM | POA: Diagnosis not present

## 2023-05-08 DIAGNOSIS — F341 Dysthymic disorder: Secondary | ICD-10-CM | POA: Diagnosis not present

## 2023-05-24 DIAGNOSIS — F39 Unspecified mood [affective] disorder: Secondary | ICD-10-CM | POA: Diagnosis not present

## 2023-05-24 DIAGNOSIS — F411 Generalized anxiety disorder: Secondary | ICD-10-CM | POA: Diagnosis not present

## 2023-05-25 DIAGNOSIS — F341 Dysthymic disorder: Secondary | ICD-10-CM | POA: Diagnosis not present

## 2023-05-25 DIAGNOSIS — F4010 Social phobia, unspecified: Secondary | ICD-10-CM | POA: Diagnosis not present

## 2023-06-15 ENCOUNTER — Ambulatory Visit (INDEPENDENT_AMBULATORY_CARE_PROVIDER_SITE_OTHER): Payer: BC Managed Care – PPO | Admitting: Family Medicine

## 2023-07-06 DIAGNOSIS — F4010 Social phobia, unspecified: Secondary | ICD-10-CM | POA: Diagnosis not present

## 2023-07-06 DIAGNOSIS — F341 Dysthymic disorder: Secondary | ICD-10-CM | POA: Diagnosis not present

## 2023-07-11 ENCOUNTER — Encounter (INDEPENDENT_AMBULATORY_CARE_PROVIDER_SITE_OTHER): Payer: Self-pay | Admitting: Family Medicine

## 2023-07-11 ENCOUNTER — Ambulatory Visit (INDEPENDENT_AMBULATORY_CARE_PROVIDER_SITE_OTHER): Payer: BC Managed Care – PPO | Admitting: Family Medicine

## 2023-07-11 VITALS — BP 128/79 | HR 72 | Temp 98.1°F | Ht 64.0 in | Wt 239.0 lb

## 2023-07-11 DIAGNOSIS — E785 Hyperlipidemia, unspecified: Secondary | ICD-10-CM

## 2023-07-11 DIAGNOSIS — E669 Obesity, unspecified: Secondary | ICD-10-CM | POA: Diagnosis not present

## 2023-07-11 DIAGNOSIS — E1169 Type 2 diabetes mellitus with other specified complication: Secondary | ICD-10-CM | POA: Diagnosis not present

## 2023-07-11 DIAGNOSIS — Z7985 Long-term (current) use of injectable non-insulin antidiabetic drugs: Secondary | ICD-10-CM

## 2023-07-11 DIAGNOSIS — Z6841 Body Mass Index (BMI) 40.0 and over, adult: Secondary | ICD-10-CM

## 2023-07-11 DIAGNOSIS — E782 Mixed hyperlipidemia: Secondary | ICD-10-CM | POA: Diagnosis not present

## 2023-07-11 DIAGNOSIS — E559 Vitamin D deficiency, unspecified: Secondary | ICD-10-CM | POA: Diagnosis not present

## 2023-07-11 MED ORDER — SEMAGLUTIDE (2 MG/DOSE) 8 MG/3ML ~~LOC~~ SOPN: 2.0000 mg | PEN_INJECTOR | SUBCUTANEOUS | 0 refills | Status: DC

## 2023-07-11 NOTE — Progress Notes (Signed)
.smr  Office: [phone]  /  Fax: [phone]  WEIGHT SUMMARY AND BIOMETRICS  Anthropometric Measurements Height: 5\' 4"  (1.626 m) Weight: 239 lb (108.4 kg) BMI (Calculated): 41 Weight at Last Visit: 241 lb Weight Lost Since Last Visit: 2 lb Weight Gained Since Last Visit: 0 Total Weight Loss (lbs): 140 lb (63.5 kg)   Body Composition  Body Fat %: 48.6 % Fat Mass (lbs): 116.6 lbs Muscle Mass (lbs): 117 lbs Total Body Water (lbs): 88.6 lbs Visceral Fat Rating : 15   Other Clinical Data Fasting: No Labs: No Today's Visit #: 48    Chief Complaint: OBESITY   History of Present Illness   The patient, with a history of type 2 diabetes and obesity, presents for a follow-up visit. She is currently on Ozempic 2 mg per week for diabetes management and has been focusing on diet and exercise for both blood sugar control and weight loss. Over the past 10 weeks, she has lost 2 pounds. However, she reports not keeping a food journal or exercising since the last visit. Her dietary plan includes a calorie goal of 1200-1400 and at least 80 grams of protein.  The patient has been dealing with significant stress due to home renovations, specifically a bathroom remodel that extended from a 3-4 day job to a 6-week job. Despite this stress, she has managed to maintain her weight loss and even lose a bit more. She attributes this to watching portion sizes and focusing on protein intake.  The patient also mentions an unusual drop in her blood sugar levels, which she speculates might be due to a recent cupping session. She reports that her blood sugars typically run in the low 110s, but after the cupping session, she noticed a drop to 84 or 88.  In terms of upcoming challenges, the patient does not foresee Halloween as a problem due to a lack of children in her neighborhood and her own children being too old for trick-or-treating. However, she mentions a family gathering for her mother's birthday,  which might present some dietary challenges.  The patient also discusses some interpersonal issues, specifically a lack of emotional support from her spouse, who does not understand her struggle with obesity and criticizes her efforts to seek help. Despite this, the patient continues to see a counselor for validation and self-care strategies. She expresses a desire to continue improving her health and maintaining control over her diabetes and weight.          PHYSICAL EXAM:  Blood pressure 128/79, pulse 72, temperature 98.1 F (36.7 C), height 5\' 4"  (1.626 m), weight 239 lb (108.4 kg), SpO2 98%. Body mass index is 41.02 kg/m.  DIAGNOSTIC DATA REVIEWED:  BMET    Component Value Date/Time   NA 138 07/06/2022 1432   K 4.1 07/06/2022 1432   CL 100 07/06/2022 1432   CO2 22 07/06/2022 1432   GLUCOSE 94 07/06/2022 1432   GLUCOSE 87 09/17/2021 0908   BUN 11 07/06/2022 1432   CREATININE 0.73 07/06/2022 1432   CREATININE 0.68 03/28/2014 1632   CALCIUM 9.4 07/06/2022 1432   GFRNONAA >60 12/30/2020 0953   GFRAA 109 06/16/2020 1206   Lab Results  Component Value Date   HGBA1C 5.7 (H) 07/06/2022   HGBA1C 8.0 (H) 11/04/2013   Lab Results  Component Value Date   INSULIN 8.9 07/06/2022   INSULIN 20.0 07/26/2018   Lab Results  Component Value Date   TSH 1.850 07/06/2022   CBC    Component  Value Date/Time   WBC 8.6 07/06/2022 1432   WBC 8.1 12/30/2020 0953   RBC 4.85 07/06/2022 1432   RBC 4.22 12/30/2020 0953   HGB 14.8 07/06/2022 1432   HCT 43.4 07/06/2022 1432   PLT 288 07/06/2022 1432   MCV 90 07/06/2022 1432   MCH 30.5 07/06/2022 1432   MCH 30.3 12/30/2020 0953   MCHC 34.1 07/06/2022 1432   MCHC 31.7 12/30/2020 0953   RDW 12.8 07/06/2022 1432   Iron Studies    Component Value Date/Time   IRON 62 05/30/2016 1307   FERRITIN 55.2 05/30/2016 1307   IRONPCTSAT 16.4 (L) 05/30/2016 1307   Lipid Panel     Component Value Date/Time   CHOL 152 07/06/2022 1432   CHOL  123 10/30/2012 0000   TRIG 124 07/06/2022 1432   TRIG 124 10/30/2012 0000   HDL 57 07/06/2022 1432   CHOLHDL 3 09/17/2021 0908   VLDL 30.8 09/17/2021 0908   LDLCALC 73 07/06/2022 1432   LDLCALC 59 10/30/2012 0000   LDLDIRECT 73.0 04/18/2017 0847   Hepatic Function Panel     Component Value Date/Time   PROT 7.0 07/06/2022 1432   ALBUMIN 4.6 07/06/2022 1432   AST 19 07/06/2022 1432   ALT 16 07/06/2022 1432   ALKPHOS 145 (H) 07/06/2022 1432   BILITOT 0.4 07/06/2022 1432      Component Value Date/Time   TSH 1.850 07/06/2022 1432   Nutritional Lab Results  Component Value Date   VD25OH 56.7 07/06/2022   VD25OH 46.89 09/17/2021   VD25OH 60.0 06/01/2021     Assessment and Plan    Type 2 Diabetes Well controlled with Ozempic 2mg  weekly. Fasting blood sugars mostly below 120. Last A1C was 5.7 a year ago. -Continue Ozempic 2mg  weekly. -Order labs including A1C, lipid panel, liver enzymes, and thyroid function.  Obesity Slow weight loss (2 pounds in 10 weeks) despite challenges with home renovations and stress. Patient is not currently keeping a food journal or exercising regularly. -Encourage continuation of portion control and focus on protein intake. -Encourage resumption of food journaling and regular exercise. -Plan for follow-up in 3 months.  Hyperlipidemia On Lipitor, working on diet  -Continue Lipitor. -Check lipid panel with other labs.   Vit D deficiency On Vit D -Check labs  Psychosocial Stress Significant stress due to home renovations and unsupportive spouse. Patient is seeing a counselor every two weeks. -Encourage continuation of counseling sessions. -Encourage self-care and setting boundaries with spouse.  General Health Maintenance -Order 90-day prescription of Ozempic to last through the end of the year. -Plan for follow-up after the holidays in 3 months.         I have personally spent 40 minutes total time today in preparation, patient  care, and documentation for this visit, including the following: review of clinical lab tests; review of medical tests/procedures/services.    She was informed of the importance of frequent follow up visits to maximize her success with intensive lifestyle modifications for her multiple health conditions.    Quillian Quince, MD

## 2023-07-12 LAB — CBC WITH DIFFERENTIAL/PLATELET
Basophils Absolute: 0 10*3/uL (ref 0.0–0.2)
Basos: 0 %
EOS (ABSOLUTE): 0.2 10*3/uL (ref 0.0–0.4)
Eos: 2 %
Hematocrit: 41.3 % (ref 34.0–46.6)
Hemoglobin: 13.5 g/dL (ref 11.1–15.9)
Immature Grans (Abs): 0 10*3/uL (ref 0.0–0.1)
Immature Granulocytes: 0 %
Lymphocytes Absolute: 2.3 10*3/uL (ref 0.7–3.1)
Lymphs: 29 %
MCH: 30 pg (ref 26.6–33.0)
MCHC: 32.7 g/dL (ref 31.5–35.7)
MCV: 92 fL (ref 79–97)
Monocytes Absolute: 0.3 10*3/uL (ref 0.1–0.9)
Monocytes: 4 %
Neutrophils Absolute: 5.1 10*3/uL (ref 1.4–7.0)
Neutrophils: 65 %
Platelets: 272 10*3/uL (ref 150–450)
RBC: 4.5 x10E6/uL (ref 3.77–5.28)
RDW: 13.1 % (ref 11.7–15.4)
WBC: 7.9 10*3/uL (ref 3.4–10.8)

## 2023-07-12 LAB — TSH: TSH: 1.76 u[IU]/mL (ref 0.450–4.500)

## 2023-07-12 LAB — LIPID PANEL WITH LDL/HDL RATIO
Cholesterol, Total: 151 mg/dL (ref 100–199)
HDL: 54 mg/dL (ref >39)
LDL Chol Calc (NIH): 75 mg/dL (ref 0–99)
LDL/HDL Ratio: 1.4 ratio (ref 0.0–3.2)
Triglycerides: 123 mg/dL (ref 0–149)
VLDL Cholesterol Cal: 22 mg/dL (ref 5–40)

## 2023-07-12 LAB — CMP14+EGFR
ALT: 18 [IU]/L (ref 0–32)
AST: 17 [IU]/L (ref 0–40)
Albumin: 4.4 g/dL (ref 3.8–4.9)
Alkaline Phosphatase: 133 [IU]/L — ABNORMAL HIGH (ref 44–121)
BUN/Creatinine Ratio: 11 (ref 9–23)
BUN: 9 mg/dL (ref 6–24)
Bilirubin Total: 0.3 mg/dL (ref 0.0–1.2)
CO2: 26 mmol/L (ref 20–29)
Calcium: 9.4 mg/dL (ref 8.7–10.2)
Chloride: 103 mmol/L (ref 96–106)
Creatinine, Ser: 0.8 mg/dL (ref 0.57–1.00)
Globulin, Total: 2.1 g/dL (ref 1.5–4.5)
Glucose: 91 mg/dL (ref 70–99)
Potassium: 4.2 mmol/L (ref 3.5–5.2)
Sodium: 143 mmol/L (ref 134–144)
Total Protein: 6.5 g/dL (ref 6.0–8.5)
eGFR: 88 mL/min/{1.73_m2} (ref >59)

## 2023-07-12 LAB — VITAMIN D 25 HYDROXY (VIT D DEFICIENCY, FRACTURES): Vit D, 25-Hydroxy: 58.7 ng/mL (ref 30.0–100.0)

## 2023-07-12 LAB — INSULIN, RANDOM: INSULIN: 8.1 u[IU]/mL (ref 2.6–24.9)

## 2023-07-12 LAB — MICROALBUMIN / CREATININE URINE RATIO
Creatinine, Urine: 44.5 mg/dL
Microalb/Creat Ratio: <7 mg/g{creat} (ref 0–29)
Microalbumin, Urine: <3 ug/mL

## 2023-07-12 LAB — VITAMIN B12: Vitamin B-12: 845 pg/mL (ref 232–1245)

## 2023-07-12 LAB — HEMOGLOBIN A1C
Est. average glucose Bld gHb Est-mCnc: 128 mg/dL
Hgb A1c MFr Bld: 6.1 % — ABNORMAL HIGH (ref 4.8–5.6)

## 2023-07-27 DIAGNOSIS — H3561 Retinal hemorrhage, right eye: Secondary | ICD-10-CM | POA: Diagnosis not present

## 2023-08-02 DIAGNOSIS — M4150 Other secondary scoliosis, site unspecified: Secondary | ICD-10-CM | POA: Diagnosis not present

## 2023-08-02 DIAGNOSIS — Z6841 Body Mass Index (BMI) 40.0 and over, adult: Secondary | ICD-10-CM | POA: Diagnosis not present

## 2023-08-02 DIAGNOSIS — M4326 Fusion of spine, lumbar region: Secondary | ICD-10-CM | POA: Diagnosis not present

## 2023-08-03 DIAGNOSIS — F4010 Social phobia, unspecified: Secondary | ICD-10-CM | POA: Diagnosis not present

## 2023-08-03 DIAGNOSIS — F341 Dysthymic disorder: Secondary | ICD-10-CM | POA: Diagnosis not present

## 2023-08-23 DIAGNOSIS — F39 Unspecified mood [affective] disorder: Secondary | ICD-10-CM | POA: Diagnosis not present

## 2023-08-23 DIAGNOSIS — F411 Generalized anxiety disorder: Secondary | ICD-10-CM | POA: Diagnosis not present

## 2023-08-31 DIAGNOSIS — F341 Dysthymic disorder: Secondary | ICD-10-CM | POA: Diagnosis not present

## 2023-08-31 DIAGNOSIS — F4010 Social phobia, unspecified: Secondary | ICD-10-CM | POA: Diagnosis not present

## 2023-09-27 ENCOUNTER — Other Ambulatory Visit: Payer: Self-pay | Admitting: Family Medicine

## 2023-09-27 NOTE — Telephone Encounter (Signed)
 Last CPE- 11/2021. Needs appt.  Request denied.

## 2023-09-28 DIAGNOSIS — F341 Dysthymic disorder: Secondary | ICD-10-CM | POA: Diagnosis not present

## 2023-09-28 DIAGNOSIS — F4010 Social phobia, unspecified: Secondary | ICD-10-CM | POA: Diagnosis not present

## 2023-10-05 DIAGNOSIS — B354 Tinea corporis: Secondary | ICD-10-CM | POA: Diagnosis not present

## 2023-10-12 DIAGNOSIS — F341 Dysthymic disorder: Secondary | ICD-10-CM | POA: Diagnosis not present

## 2023-10-12 DIAGNOSIS — F4010 Social phobia, unspecified: Secondary | ICD-10-CM | POA: Diagnosis not present

## 2023-10-18 ENCOUNTER — Encounter (INDEPENDENT_AMBULATORY_CARE_PROVIDER_SITE_OTHER): Payer: Self-pay | Admitting: Family Medicine

## 2023-10-18 ENCOUNTER — Ambulatory Visit (INDEPENDENT_AMBULATORY_CARE_PROVIDER_SITE_OTHER): Payer: BC Managed Care – PPO | Admitting: Family Medicine

## 2023-10-18 VITALS — BP 122/71 | HR 79 | Temp 98.0°F | Ht 64.0 in | Wt 236.0 lb

## 2023-10-18 DIAGNOSIS — E669 Obesity, unspecified: Secondary | ICD-10-CM | POA: Diagnosis not present

## 2023-10-18 DIAGNOSIS — K219 Gastro-esophageal reflux disease without esophagitis: Secondary | ICD-10-CM

## 2023-10-18 DIAGNOSIS — E119 Type 2 diabetes mellitus without complications: Secondary | ICD-10-CM | POA: Diagnosis not present

## 2023-10-18 DIAGNOSIS — E1169 Type 2 diabetes mellitus with other specified complication: Secondary | ICD-10-CM

## 2023-10-18 DIAGNOSIS — Z7985 Long-term (current) use of injectable non-insulin antidiabetic drugs: Secondary | ICD-10-CM

## 2023-10-18 DIAGNOSIS — Z6841 Body Mass Index (BMI) 40.0 and over, adult: Secondary | ICD-10-CM | POA: Diagnosis not present

## 2023-10-18 MED ORDER — TIRZEPATIDE 12.5 MG/0.5ML ~~LOC~~ SOAJ: 12.5000 mg | SUBCUTANEOUS | 0 refills | Status: DC

## 2023-10-18 NOTE — Progress Notes (Signed)
.smr  Office: [phone]  /  Fax: [phone]  WEIGHT SUMMARY AND BIOMETRICS  Anthropometric Measurements Height: 5\' 4"  (1.626 m) Weight: 236 lb (107 kg) BMI (Calculated): 40.49 Weight at Last Visit: 239 lb Weight Lost Since Last Visit: 3 lb Weight Gained Since Last Visit: 0 Total Weight Loss (lbs): 143 lb (64.9 kg)   Body Composition  Body Fat %: 49.6 % Fat Mass (lbs): 117.4 lbs Muscle Mass (lbs): 113.4 lbs Total Body Water (lbs): 90.2 lbs Visceral Fat Rating : 15   No data recorded  Chief Complaint: OBESITY    History of Present Illness   The patient, with type 2 diabetes, presents to discuss her obesity and weight management.  She has been actively working on her diet and exercise regimen, resulting in a weight loss of three pounds over the past three months. She journals her dietary intake about 25% of the time, aiming for a calorie intake of 1200 to 1400 calories per day and a protein intake of 80 grams or more.  She is currently taking Ozempic for her type 2 diabetes at a dose of 2 mg weekly. She has been on Ozempic since July, approximately six to seven months. She has two doses remaining and is seeking a refill. She experienced belching with an 'eggy' taste starting around mid-November, which she attributed to taking Ozempic on an empty stomach. After consulting with a pharmacist, she adjusted her routine to take the injection after meals, which has alleviated the symptoms. She initially tried omeprazole but discontinued it after the change in her injection timing resolved the issue.          PHYSICAL EXAM:  Blood pressure 122/71, pulse 79, temperature 98 F (36.7 C), height 5\' 4"  (1.626 m), weight 236 lb (107 kg), SpO2 96%. Body mass index is 40.51 kg/m.  DIAGNOSTIC DATA REVIEWED:  BMET    Component Value Date/Time   NA 143 07/11/2023 1247   K 4.2 07/11/2023 1247   CL 103 07/11/2023 1247   CO2 26 07/11/2023 1247   GLUCOSE 91 07/11/2023 1247    GLUCOSE 87 09/17/2021 0908   BUN 9 07/11/2023 1247   CREATININE 0.80 07/11/2023 1247   CREATININE 0.68 03/28/2014 1632   CALCIUM 9.4 07/11/2023 1247   GFRNONAA >60 12/30/2020 0953   GFRAA 109 06/16/2020 1206   Lab Results  Component Value Date   HGBA1C 6.1 (H) 07/11/2023   HGBA1C 8.0 (H) 11/04/2013   Lab Results  Component Value Date   INSULIN 8.1 07/11/2023   INSULIN 20.0 07/26/2018   Lab Results  Component Value Date   TSH 1.760 07/11/2023   CBC    Component Value Date/Time   WBC 7.9 07/11/2023 1247   WBC 8.1 12/30/2020 0953   RBC 4.50 07/11/2023 1247   RBC 4.22 12/30/2020 0953   HGB 13.5 07/11/2023 1247   HCT 41.3 07/11/2023 1247   PLT 272 07/11/2023 1247   MCV 92 07/11/2023 1247   MCH 30.0 07/11/2023 1247   MCH 30.3 12/30/2020 0953   MCHC 32.7 07/11/2023 1247   MCHC 31.7 12/30/2020 0953   RDW 13.1 07/11/2023 1247   Iron Studies    Component Value Date/Time   IRON 62 05/30/2016 1307   FERRITIN 55.2 05/30/2016 1307   IRONPCTSAT 16.4 (L) 05/30/2016 1307   Lipid Panel     Component Value Date/Time   CHOL 151 07/11/2023 1247   CHOL 123 10/30/2012 0000   TRIG 123 07/11/2023 1247   TRIG 124 10/30/2012 0000  HDL 54 07/11/2023 1247   CHOLHDL 3 09/17/2021 0908   VLDL 30.8 09/17/2021 0908   LDLCALC 75 07/11/2023 1247   LDLCALC 59 10/30/2012 0000   LDLDIRECT 73.0 04/18/2017 0847   Hepatic Function Panel     Component Value Date/Time   PROT 6.5 07/11/2023 1247   ALBUMIN 4.4 07/11/2023 1247   AST 17 07/11/2023 1247   ALT 18 07/11/2023 1247   ALKPHOS 133 (H) 07/11/2023 1247   BILITOT 0.3 07/11/2023 1247      Component Value Date/Time   TSH 1.760 07/11/2023 1247   Nutritional Lab Results  Component Value Date   VD25OH 58.7 07/11/2023   VD25OH 56.7 07/06/2022   VD25OH 46.89 09/17/2021     Assessment and Plan    Type 2 Diabetes Mellitus Type 2 diabetes managed with Ozempic 2 mg weekly. Reports belching and heartburn, likely due to Ozempic.  Symptoms improved by taking Ozempic after meals. Requests to switch to [facility], a dual GIP/GLP-1 receptor agonist, potentially offering a slight improvement in managing hunger and cravings (100% vs. 95%). - Initiate prior authorization for Mounjaro - Switch to Mounjaro 12.5 mg weekly - Follow-up in 6 weeks to assess response to [facility] - Continue Ozempic for the remaining two doses before switching over to [facility]  Gastroesophageal Reflux Disease (GERD) Belching and heartburn likely secondary to Ozempic. Symptoms improved with timing of medication administration. Omeprazole was discontinued after adjusting the timing of Ozempic. - Monitor for recurrence of symptoms - Consider reinitiating omeprazole if symptoms return  Obesity Obesity with a recent weight loss of 3 pounds over the last three months. Working on diet and exercise, journaling intermittently, and maintaining calorie and protein goals. Managed to avoid significant weight gain during holidays by focusing on protein intake and portion control. - Continue current diet and exercise regimen - Encourage more consistent journaling of food intake  General Health Maintenance Last primary care visit was in February 2024. - Schedule a regular office visit with primary care provider - Discuss the need for a physical exam and any necessary screenings during the visit  Follow-up - Follow-up in 6 weeks to assess response to [facility] - Schedule a regular office visit with primary care provider.        She was informed of the importance of frequent follow up visits to maximize her success with intensive lifestyle modifications for her multiple health conditions.    Quillian Quince, MD

## 2023-10-24 ENCOUNTER — Encounter (INDEPENDENT_AMBULATORY_CARE_PROVIDER_SITE_OTHER): Payer: Self-pay | Admitting: Family Medicine

## 2023-10-24 ENCOUNTER — Telehealth: Payer: Self-pay

## 2023-10-24 NOTE — Telephone Encounter (Signed)
PA submitted through Cover My Meds for [facility]. Awaiting insurance determination. Key: [id number]

## 2023-10-26 DIAGNOSIS — F341 Dysthymic disorder: Secondary | ICD-10-CM | POA: Diagnosis not present

## 2023-10-26 DIAGNOSIS — F4010 Social phobia, unspecified: Secondary | ICD-10-CM | POA: Diagnosis not present

## 2023-10-30 ENCOUNTER — Ambulatory Visit: Payer: BC Managed Care – PPO | Admitting: Podiatry

## 2023-10-30 ENCOUNTER — Encounter: Payer: Self-pay | Admitting: Podiatry

## 2023-10-30 DIAGNOSIS — B351 Tinea unguium: Secondary | ICD-10-CM | POA: Diagnosis not present

## 2023-10-30 DIAGNOSIS — L600 Ingrowing nail: Secondary | ICD-10-CM

## 2023-10-30 NOTE — Patient Instructions (Signed)

## 2023-10-30 NOTE — Progress Notes (Signed)
 Subjective:   Patient ID: Lisa Curry, female   DOB: 53 y.o.   MRN: [id number]   HPI Patient presents with a severely deformed left big toenail that is been painful across the surface and it has been present for about 6 years.  States she did have an injury at that time does not smoke likes to be active   Review of Systems  All other systems reviewed and are negative.       Objective:  Physical Exam Vitals and nursing note reviewed.  Constitutional:      Appearance: She is well-developed.  Pulmonary:     Effort: Pulmonary effort is normal.  Musculoskeletal:        General: Normal range of motion.  Skin:    General: Skin is warm.  Neurological:     Mental Status: She is alert.     Neurovascular status intact muscle strength adequate range of motion adequate severely thickened left big toenail dystrophic and painful when pressed with some discoloration of adjacent nailbeds     Assessment:  Appears to be a damaged big toenail left patient does have diabetes under good control and back issues that she cannot reach her feet with the nail being painful when palpated     Plan:  H&P performed discussed fungus versus trauma and I think most of this is probably trauma over fungus and today I went ahead and I discussed correction of the deformity she wants this done I allowed her to read consent form for correction she understands risk and I went ahead today and I infiltrated the left big toe 60 mg Xylocaine  Marcaine  mixture after she signed consent form sterile prep done and using sterile instrumentation removed the hallux nail exposed matrix applied phenol for applications 30 seconds followed by alcohol lavage sterile dressing gave instructions on soaks wear dressing 24 hours take it off earlier if throbbing were to occur and encouraged her to call with questions concerns which may arise

## 2023-11-01 ENCOUNTER — Encounter: Payer: Self-pay | Admitting: Podiatry

## 2023-11-01 NOTE — Telephone Encounter (Signed)
Received approval through Cover My Meds for [facility] Approved on February 4 by [employer] 2017 PA Case: [id number], Status: Approved, Coverage Starts on: 10/24/2023 12:00:00 AM, Coverage Ends on: 10/23/2024 12:00:00 AM. Authorization Expiration Date: 10/22/2024

## 2023-11-02 DIAGNOSIS — M47816 Spondylosis without myelopathy or radiculopathy, lumbar region: Secondary | ICD-10-CM | POA: Diagnosis not present

## 2023-11-02 DIAGNOSIS — Z6841 Body Mass Index (BMI) 40.0 and over, adult: Secondary | ICD-10-CM | POA: Diagnosis not present

## 2023-11-02 DIAGNOSIS — M4326 Fusion of spine, lumbar region: Secondary | ICD-10-CM | POA: Diagnosis not present

## 2023-11-06 ENCOUNTER — Encounter (INDEPENDENT_AMBULATORY_CARE_PROVIDER_SITE_OTHER): Payer: Self-pay | Admitting: Family Medicine

## 2023-11-06 ENCOUNTER — Telehealth (INDEPENDENT_AMBULATORY_CARE_PROVIDER_SITE_OTHER): Payer: Self-pay | Admitting: Physician Assistant

## 2023-11-06 NOTE — Telephone Encounter (Signed)
See telephone call from patient.  She will see Dr. Dalbert Garnet in follow up tomorrow afternoon.

## 2023-11-07 ENCOUNTER — Ambulatory Visit (INDEPENDENT_AMBULATORY_CARE_PROVIDER_SITE_OTHER): Payer: BC Managed Care – PPO | Admitting: Family Medicine

## 2023-11-10 ENCOUNTER — Telehealth: Payer: Self-pay

## 2023-11-10 NOTE — Telephone Encounter (Signed)
 Noted

## 2023-11-10 NOTE — Telephone Encounter (Signed)
Copied from CRM [id number]. Topic: Clinical - Request for Lab/Test Order >> Nov 10, 2023  2:16 PM Florestine Avers wrote: Reason for CRM: Patient is requesting to do her lab work ahead of time for her physical. Please order physical labs and give patient a call back to schedule lab appointment.

## 2023-11-10 NOTE — Telephone Encounter (Signed)
Called pt and schedule a appt for  fasting labs

## 2023-11-10 NOTE — Telephone Encounter (Signed)
Pt has CPE on 01/02/24.   Plz schedule fasting labs 1 wk prior (no food/drink- except water and/or blk coffee 5 hrs prior).

## 2023-11-20 DIAGNOSIS — F341 Dysthymic disorder: Secondary | ICD-10-CM | POA: Diagnosis not present

## 2023-11-20 DIAGNOSIS — F4010 Social phobia, unspecified: Secondary | ICD-10-CM | POA: Diagnosis not present

## 2023-11-21 DIAGNOSIS — F39 Unspecified mood [affective] disorder: Secondary | ICD-10-CM | POA: Diagnosis not present

## 2023-11-21 DIAGNOSIS — F411 Generalized anxiety disorder: Secondary | ICD-10-CM | POA: Diagnosis not present

## 2023-11-27 ENCOUNTER — Ambulatory Visit (INDEPENDENT_AMBULATORY_CARE_PROVIDER_SITE_OTHER): Payer: BC Managed Care – PPO | Admitting: Family Medicine

## 2023-11-27 ENCOUNTER — Encounter (INDEPENDENT_AMBULATORY_CARE_PROVIDER_SITE_OTHER): Payer: Self-pay | Admitting: Family Medicine

## 2023-11-27 VITALS — BP 120/75 | HR 87 | Temp 98.3°F | Ht 64.0 in | Wt 229.0 lb

## 2023-11-27 DIAGNOSIS — E782 Mixed hyperlipidemia: Secondary | ICD-10-CM

## 2023-11-27 DIAGNOSIS — E669 Obesity, unspecified: Secondary | ICD-10-CM

## 2023-11-27 DIAGNOSIS — E1169 Type 2 diabetes mellitus with other specified complication: Secondary | ICD-10-CM

## 2023-11-27 DIAGNOSIS — Z7985 Long-term (current) use of injectable non-insulin antidiabetic drugs: Secondary | ICD-10-CM

## 2023-11-27 DIAGNOSIS — E785 Hyperlipidemia, unspecified: Secondary | ICD-10-CM | POA: Diagnosis not present

## 2023-11-27 DIAGNOSIS — R030 Elevated blood-pressure reading, without diagnosis of hypertension: Secondary | ICD-10-CM | POA: Diagnosis not present

## 2023-11-27 DIAGNOSIS — E119 Type 2 diabetes mellitus without complications: Secondary | ICD-10-CM | POA: Diagnosis not present

## 2023-11-27 DIAGNOSIS — Z6839 Body mass index (BMI) 39.0-39.9, adult: Secondary | ICD-10-CM

## 2023-11-27 MED ORDER — TIRZEPATIDE 12.5 MG/0.5ML ~~LOC~~ SOAJ: 12.5000 mg | SUBCUTANEOUS | 1 refills | Status: DC

## 2023-11-27 NOTE — Progress Notes (Signed)
 Office: [phone]  /  Fax: [phone]  WEIGHT SUMMARY AND BIOMETRICS  Anthropometric Measurements Height: 5\' 4"  (1.626 m) Weight: 229 lb (103.9 kg) BMI (Calculated): 39.29 Weight at Last Visit: 236 lb Weight Lost Since Last Visit: 7 lb Weight Gained Since Last Visit: 0 Starting Weight: 279 lb Total Weight Loss (lbs): 50 lb (22.7 kg)   Body Composition  Body Fat %: 47.2 % Fat Mass (lbs): 108.4 lbs Muscle Mass (lbs): 115 lbs Total Body Water (lbs): 84.2 lbs Visceral Fat Rating : 14   Other Clinical Data Fasting: no Labs: no Today's Visit #: 50 Starting Date: 07/26/18    Chief Complaint: OBESITY   History of Present Illness   The patient, with type 2 diabetes, presents for obesity treatment and monitoring of progress.  She has lost seven pounds in the last six weeks through a dietary plan that includes journaling with a caloric intake of 1200 to 1400 calories and at least 80 grams of protein, which she adheres to about 60% of the time. She is not currently engaging in any exercise regimen.  She transitioned from Ozempic to [facility], currently taking 12.5 mg weekly, after finding Ozempic ineffective. Initially, she experienced 'eggy belching' and heartburn, but these symptoms have improved. She notes better intestinal function, particularly less constipation, and feels more alert and energetic. Her fasting blood sugars range from 95 to 135 mg/dL.  She does not have a history of hypertension. Her initial blood pressure was elevated at 151/83 mmHg but resolved to 120/75 mmHg upon recheck. She is on Lipitor 20 mg for hyperlipidemia associated with her diabetes and is working on reducing dietary cholesterol.  She takes over-the-counter vitamin D at 5000 IU daily, with previous levels recorded in the fifties.          PHYSICAL EXAM:  Blood pressure 120/75, pulse 87, temperature 98.3 F (36.8 C), height 5\' 4"  (1.626 m), weight 229 lb (103.9 kg), SpO2 97%. Body mass  index is 39.31 kg/m.  DIAGNOSTIC DATA REVIEWED:  BMET    Component Value Date/Time   NA 143 07/11/2023 1247   K 4.2 07/11/2023 1247   CL 103 07/11/2023 1247   CO2 26 07/11/2023 1247   GLUCOSE 91 07/11/2023 1247   GLUCOSE 87 09/17/2021 0908   BUN 9 07/11/2023 1247   CREATININE 0.80 07/11/2023 1247   CREATININE 0.68 03/28/2014 1632   CALCIUM 9.4 07/11/2023 1247   GFRNONAA >60 12/30/2020 0953   GFRAA 109 06/16/2020 1206   Lab Results  Component Value Date   HGBA1C 6.1 (H) 07/11/2023   HGBA1C 8.0 (H) 11/04/2013   Lab Results  Component Value Date   INSULIN 8.1 07/11/2023   INSULIN 20.0 07/26/2018   Lab Results  Component Value Date   TSH 1.760 07/11/2023   CBC    Component Value Date/Time   WBC 7.9 07/11/2023 1247   WBC 8.1 12/30/2020 0953   RBC 4.50 07/11/2023 1247   RBC 4.22 12/30/2020 0953   HGB 13.5 07/11/2023 1247   HCT 41.3 07/11/2023 1247   PLT 272 07/11/2023 1247   MCV 92 07/11/2023 1247   MCH 30.0 07/11/2023 1247   MCH 30.3 12/30/2020 0953   MCHC 32.7 07/11/2023 1247   MCHC 31.7 12/30/2020 0953   RDW 13.1 07/11/2023 1247   Iron Studies    Component Value Date/Time   IRON 62 05/30/2016 1307   FERRITIN 55.2 05/30/2016 1307   IRONPCTSAT 16.4 (L) 05/30/2016 1307   Lipid Panel  Component Value Date/Time   CHOL 151 07/11/2023 1247   CHOL 123 10/30/2012 0000   TRIG 123 07/11/2023 1247   TRIG 124 10/30/2012 0000   HDL 54 07/11/2023 1247   CHOLHDL 3 09/17/2021 0908   VLDL 30.8 09/17/2021 0908   LDLCALC 75 07/11/2023 1247   LDLCALC 59 10/30/2012 0000   LDLDIRECT 73.0 04/18/2017 0847   Hepatic Function Panel     Component Value Date/Time   PROT 6.5 07/11/2023 1247   ALBUMIN 4.4 07/11/2023 1247   AST 17 07/11/2023 1247   ALT 18 07/11/2023 1247   ALKPHOS 133 (H) 07/11/2023 1247   BILITOT 0.3 07/11/2023 1247      Component Value Date/Time   TSH 1.760 07/11/2023 1247   Nutritional Lab Results  Component Value Date   VD25OH 58.7  07/11/2023   VD25OH 56.7 07/06/2022   VD25OH 46.89 09/17/2021     Assessment and Plan    Obesity Patient has lost seven pounds in the last six weeks. She follows a journaling eating plan with 1200-1400 calories and 80+ grams of protein, meeting this successfully about 60% of the time. She is not currently exercising. Discussed the importance of regular exercise and maintaining a food journal. Encouraged her to continue with the current plan as it is showing positive results. - Continue current eating plan - Encourage regular exercise - Follow up in 8 weeks  Type 2 Diabetes Mellitus Patient is on Mounjaro 12.5 mg weekly, switched from Ozempic 2 mg weekly due to perceived ineffectiveness. Initial gastrointestinal side effects during the transition have resolved. Blood sugars are running between 95-135 mg/dL fasting. Discussed the importance of checking blood sugar 1-2 hours postprandial once or twice a week to monitor the effectiveness of Mounjaro, which works best postprandially. - Continue Mounjaro 12.5 mg weekly - Check blood sugar 1-2 hours postprandial once or twice a week - Discuss blood pressure management with Dr. Sharen Hones  Elevated Blood Pressure without history of HTN Initial blood pressure was elevated at 151/83 mmHg but resolved to 120/75 mmHg upon recheck. No history of hypertension. Discussed the potential benefit of blood pressure medications that protect the kidneys, especially in the context of type 2 diabetes. Advised to discuss this with Dr. Sharen Hones. - Discuss blood pressure management with Dr. Sharen Hones  Hyperlipidemia Patient is on Lipitor 20 mg daily for hyperlipidemia associated with type 2 diabetes. She is also working on decreasing cholesterol in her diet. No reported issues with Lipitor. - Continue Lipitor 20 mg daily - Continue dietary modifications to decrease cholesterol  General Health Maintenance Patient is on over-the-counter vitamin D 5000 IU daily.  Discussed the importance of monitoring vitamin D levels, especially during weight loss, to avoid over-replacement. Current levels are within the desired range. - Check vitamin D levels during next fasting blood draw in April  Follow-up - Follow up in 8 weeks.       She was informed of the importance of frequent follow up visits to maximize her success with intensive lifestyle modifications for her multiple health conditions.    Quillian Quince, MD

## 2023-12-07 DIAGNOSIS — F341 Dysthymic disorder: Secondary | ICD-10-CM | POA: Diagnosis not present

## 2023-12-07 DIAGNOSIS — F4010 Social phobia, unspecified: Secondary | ICD-10-CM | POA: Diagnosis not present

## 2023-12-14 ENCOUNTER — Telehealth: Payer: Self-pay

## 2023-12-14 DIAGNOSIS — K219 Gastro-esophageal reflux disease without esophagitis: Secondary | ICD-10-CM

## 2023-12-14 MED ORDER — OMEPRAZOLE 40 MG PO CPDR: 40.0000 mg | DELAYED_RELEASE_CAPSULE | Freq: Every day | ORAL | 0 refills | Status: DC

## 2023-12-14 NOTE — Telephone Encounter (Signed)
 Copied from CRM [id number]. Topic: Clinical - Prescription Issue >> Dec 14, 2023  9:08 AM Mackie Pai E wrote: Reason for CRM: Patient called in stating that she has her physical on April 15 and will be discussing her medication refills at the time of this appointment, but she only has 6 doses left of her omeprazole (PRILOSEC) 40 MG capsule. Patient questioning if this medication can get authorized for a refill before her physical. Callback number for patient is [phone] to discuss.

## 2023-12-14 NOTE — Addendum Note (Signed)
 Addended by: Nanci Pina on: 12/14/2023 03:26 PM   Modules accepted: Orders

## 2023-12-14 NOTE — Telephone Encounter (Signed)
E-scribed refill to [facility]. 

## 2023-12-21 DIAGNOSIS — F4010 Social phobia, unspecified: Secondary | ICD-10-CM | POA: Diagnosis not present

## 2023-12-21 DIAGNOSIS — F341 Dysthymic disorder: Secondary | ICD-10-CM | POA: Diagnosis not present

## 2023-12-23 ENCOUNTER — Other Ambulatory Visit: Payer: Self-pay | Admitting: Family Medicine

## 2023-12-23 DIAGNOSIS — E7849 Other hyperlipidemia: Secondary | ICD-10-CM

## 2023-12-23 DIAGNOSIS — E559 Vitamin D deficiency, unspecified: Secondary | ICD-10-CM

## 2023-12-23 DIAGNOSIS — E1169 Type 2 diabetes mellitus with other specified complication: Secondary | ICD-10-CM

## 2023-12-23 DIAGNOSIS — D649 Anemia, unspecified: Secondary | ICD-10-CM

## 2023-12-26 ENCOUNTER — Other Ambulatory Visit (INDEPENDENT_AMBULATORY_CARE_PROVIDER_SITE_OTHER): Payer: BC Managed Care – PPO

## 2023-12-26 DIAGNOSIS — E1169 Type 2 diabetes mellitus with other specified complication: Secondary | ICD-10-CM

## 2023-12-26 DIAGNOSIS — E559 Vitamin D deficiency, unspecified: Secondary | ICD-10-CM | POA: Diagnosis not present

## 2023-12-26 DIAGNOSIS — E7849 Other hyperlipidemia: Secondary | ICD-10-CM | POA: Diagnosis not present

## 2023-12-26 LAB — VITAMIN D 25 HYDROXY (VIT D DEFICIENCY, FRACTURES): VITD: 59.19 ng/mL (ref 30.00–100.00)

## 2023-12-26 LAB — COMPREHENSIVE METABOLIC PANEL WITH GFR
ALT: 12 U/L (ref 0–35)
AST: 15 U/L (ref 0–37)
Albumin: 4.4 g/dL (ref 3.5–5.2)
Alkaline Phosphatase: 109 U/L (ref 39–117)
BUN: 13 mg/dL (ref 6–23)
CO2: 26 meq/L (ref 19–32)
Calcium: 9.3 mg/dL (ref 8.4–10.5)
Chloride: 104 meq/L (ref 96–112)
Creatinine, Ser: 0.89 mg/dL (ref 0.40–1.20)
GFR: 73.87 mL/min (ref >60.00)
Glucose, Bld: 110 mg/dL — ABNORMAL HIGH (ref 70–99)
Potassium: 3.8 meq/L (ref 3.5–5.1)
Sodium: 143 meq/L (ref 135–145)
Total Bilirubin: 0.5 mg/dL (ref 0.2–1.2)
Total Protein: 6.5 g/dL (ref 6.0–8.3)

## 2023-12-26 LAB — MICROALBUMIN / CREATININE URINE RATIO
Creatinine,U: 155.6 mg/dL
Microalb Creat Ratio: UNDETERMINED mg/g (ref 0.0–30.0)
Microalb, Ur: <0.7 mg/dL

## 2023-12-26 LAB — LIPID PANEL
Cholesterol: 132 mg/dL (ref 0–200)
HDL: 43.6 mg/dL (ref >39.00)
LDL Cholesterol: 56 mg/dL (ref 0–99)
NonHDL: 88.54
Total CHOL/HDL Ratio: 3
Triglycerides: 162 mg/dL — ABNORMAL HIGH (ref 0.0–149.0)
VLDL: 32.4 mg/dL (ref 0.0–40.0)

## 2023-12-26 LAB — HEMOGLOBIN A1C: Hgb A1c MFr Bld: 5.5 % (ref 4.6–6.5)

## 2024-01-02 ENCOUNTER — Encounter: Payer: Self-pay | Admitting: Family Medicine

## 2024-01-02 ENCOUNTER — Ambulatory Visit (INDEPENDENT_AMBULATORY_CARE_PROVIDER_SITE_OTHER): Payer: BC Managed Care – PPO | Admitting: Family Medicine

## 2024-01-02 VITALS — BP 122/64 | HR 86 | Temp 98.5°F | Ht 63.0 in | Wt 223.5 lb

## 2024-01-02 DIAGNOSIS — E7849 Other hyperlipidemia: Secondary | ICD-10-CM | POA: Diagnosis not present

## 2024-01-02 DIAGNOSIS — K219 Gastro-esophageal reflux disease without esophagitis: Secondary | ICD-10-CM

## 2024-01-02 DIAGNOSIS — F331 Major depressive disorder, recurrent, moderate: Secondary | ICD-10-CM

## 2024-01-02 DIAGNOSIS — E1169 Type 2 diabetes mellitus with other specified complication: Secondary | ICD-10-CM

## 2024-01-02 DIAGNOSIS — Z0000 Encounter for general adult medical examination without abnormal findings: Secondary | ICD-10-CM | POA: Diagnosis not present

## 2024-01-02 DIAGNOSIS — E559 Vitamin D deficiency, unspecified: Secondary | ICD-10-CM

## 2024-01-02 DIAGNOSIS — M5441 Lumbago with sciatica, right side: Secondary | ICD-10-CM

## 2024-01-02 DIAGNOSIS — G4733 Obstructive sleep apnea (adult) (pediatric): Secondary | ICD-10-CM

## 2024-01-02 DIAGNOSIS — Z7985 Long-term (current) use of injectable non-insulin antidiabetic drugs: Secondary | ICD-10-CM

## 2024-01-02 DIAGNOSIS — K5909 Other constipation: Secondary | ICD-10-CM

## 2024-01-02 DIAGNOSIS — G8929 Other chronic pain: Secondary | ICD-10-CM

## 2024-01-02 MED ORDER — ATORVASTATIN CALCIUM 20 MG PO TABS: ORAL_TABLET | ORAL | 4 refills | Status: DC

## 2024-01-02 MED ORDER — OMEPRAZOLE 40 MG PO CPDR: 40.0000 mg | DELAYED_RELEASE_CAPSULE | Freq: Every day | ORAL | 4 refills | Status: AC

## 2024-01-02 NOTE — Assessment & Plan Note (Signed)
 Continue 5000 units daily replacement

## 2024-01-02 NOTE — Progress Notes (Signed)
 Ph: [phone] Fax: [phone]   Patient ID: Lisa Curry, female    DOB: 1970-12-08, 53 y.o.   MRN: [id number]  This visit was conducted in person.  BP 122/64   Pulse 86   Temp 98.5 F (36.9 C) (Oral)   Ht 5\' 3"  (1.6 m)   Wt 223 lb 8 oz (101.4 kg)   LMP 11/14/2023   SpO2 98%   BMI 39.59 kg/m    CC: CPE Subjective:   HPI: Lisa Curry is a 53 y.o. female presenting on 01/02/2024 for Annual Exam   Last seen in office 10/2022.  [facility], last seen 11/2023. Continues mounjaro 12.5mg  weekly.    S/p back surgery 07/2022 Noel Gerold) L1/2 laminectomy and L2-4 laminectomy and fusion with TLIF for spinal stenosis and HNP.   OSA on CPAP followed by neurology, [facility].   Depression/anxiety - regularly sees psych Dr Maryruth Bun on lamictal  and sertraline.    T2DM - on Moun[facility]o [facility]. Switched from [place] in October 2024.  Lab Results  Component Value Date   HGBA1C 5.5 12/26/2023   Preventative: Colonoscopy 01/2021 - TA x2, rpt 7 yrs Christella Hartigan) Mammogram: 11/2021 Tanna Furry. upcoming apt 01/2024 [facility] [place] [facility] - pap smear: 2012, 2015, 2016, 2019, 2023 WNL (absent transformation zone). saw GYN last year for ongoing periods s/p biopsy and sono-hystogram. Planned f/u with GYN this year.  LMP 11/14/2023, Q2-3 months DEXA scan - not due Lung cancer screening - not eligible  Flu: yearly COVID vaccine J&J 12/2019, no booster Pneumovax 2015 Tdap 09/2020 Shingrix - 11/2021, 02/2022 Seat belt use discussed  Sunscreen use discussed. No changing moles on skin.  Non smoker  Alcohol - rare Eye exam: every other year.  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 Interim medical history since our last visit reviewed. Allergies and medications reviewed and updated. Outpatient Medications Prior to Visit  Medication Sig Dispense Refill   Blood Glucose Monitoring Suppl (ONETOUCH VERIO REFLECT) w/Device KIT Use as instructed to check blood sugar once a day.     Cholecalciferol (VITAMIN D3) 125 MCG (5000 UT) TABS Take 5,000 Units by mouth daily.     glucose blood (ONETOUCH VERIO) test strip Use as instructed to check blood sugar 25 each 11   lamoTRIgine (LAMICTAL) 150 MG tablet Take 150 mg by mouth at bedtime.     lamoTRIgine (LAMICTAL) 200 MG tablet Take 1 tablet (200 mg total) by mouth daily. 30 tablet 3   Lancet Devices (ONE TOUCH DELICA LANCING DEV) MISC Use as directed with lancets and one touch meter E11.65 1 each 0   Multiple Vitamin (MULTIVITAMIN) capsule Take 1 capsule by mouth daily.     ONETOUCH DELICA LANCETS 33G MISC Check blood sugar twice daily and as directed. Dx E11.65 200 each 0   sertraline (ZOLOFT) 100 MG tablet Take 100 mg by mouth daily.     tirzepatide (MOUNJARO) 12.5 MG/0.5ML Pen Inject 12.5 mg into the skin once a week. 2 mL 1   atorvastatin (LIPITOR) 20 MG tablet TAKE 1 TABLET BY MOUTH DAILY AT 6 PM. 90 tablet 3   cyclobenzaprine (FLEXERIL) 10 MG tablet  TAKE 1 TABLET BY MOUTH 2 (TWO) TIMES DAILY AS NEEDED FOR MUSCLE SPASMS (SEDATION PRECAUTIONS). 30 tablet 1   omeprazole (PRILOSEC) 40 MG capsule Take 1 capsule (40 mg total) by mouth daily. 30 capsule 0   cyclobenzaprine (FLEXERIL) 10 MG tablet Take 0.5-1 tablets (5-10 mg total) by mouth 2 (two) times daily as needed for muscle spasms. (Dr Noel Gerold)     celecoxib (CELEBREX) 200 MG capsule Take 200 mg by mouth daily.     ibuprofen (ADVIL) 200 MG tablet Take 600 mg by mouth every 8 (eight) hours as needed for moderate pain.     No facility-administered medications prior to visit.     Per HPI unless specifically indicated in ROS section below Review of  Systems  Constitutional:  Negative for activity change, appetite change, chills, fatigue, fever and unexpected weight change.  HENT:  Negative for hearing loss.   Eyes:  Negative for visual disturbance.  Respiratory:  Negative for cough, chest tightness, shortness of breath and wheezing.   Cardiovascular:  Negative for chest pain, palpitations and leg swelling.  Gastrointestinal:  Negative for abdominal distention, abdominal pain, blood in stool, constipation, diarrhea, nausea and vomiting.  Genitourinary:  Negative for difficulty urinating and hematuria.  Musculoskeletal:  Negative for arthralgias, myalgias and neck pain.  Skin:  Negative for rash.  Neurological:  Negative for dizziness, seizures, syncope and headaches.  Hematological:  Negative for adenopathy. Does not bruise/bleed easily.  Psychiatric/Behavioral:  Negative for dysphoric mood. The patient is not nervous/anxious.     Objective:  BP 122/64   Pulse 86   Temp 98.5 F (36.9 C) (Oral)   Ht 5\' 3"  (1.6 m)   Wt 223 lb 8 oz (101.4 kg)   LMP 11/14/2023   SpO2 98%   BMI 39.59 kg/m   Wt Readings from Last 3 Encounters:  01/02/24 223 lb 8 oz (101.4 kg)  11/27/23 229 lb (103.9 kg)  10/18/23 236 lb (107 kg)      Physical Exam Vitals and nursing note reviewed.  Constitutional:      Appearance: Normal appearance. She is not ill-appearing.  HENT:     Head: Normocephalic and atraumatic.     Right Ear: Tympanic membrane, ear canal and external ear normal. There is no impacted cerumen.     Left Ear: Tympanic membrane, ear canal and external ear normal. There is no impacted cerumen.     Mouth/Throat:     Mouth: Mucous membranes are moist.     Pharynx: Oropharynx is clear. No oropharyngeal exudate or posterior oropharyngeal erythema.  Eyes:     General:        Right eye: No discharge.        Left eye: No discharge.     Extraocular Movements: Extraocular movements intact.     Conjunctiva/sclera: Conjunctivae normal.      Pupils: Pupils are equal, round, and reactive to light.  Neck:     Thyroid: No thyroid mass or thyromegaly.  Cardiovascular:     Rate and Rhythm: Normal rate and regular rhythm.     Pulses: Normal pulses.     Heart sounds: Normal heart sounds. No murmur heard. Pulmonary:     Effort: Pulmonary effort is normal. No respiratory distress.     Breath sounds: Normal breath sounds. No wheezing, rhonchi or rales.  Abdominal:     General: Bowel sounds are normal. There is no distension.     Palpations: Abdomen is soft. There is no mass.  Tenderness: There is no abdominal tenderness. There is no guarding or rebound.     Hernia: No hernia is present.  Musculoskeletal:     Cervical back: Normal range of motion and neck supple. No rigidity.     Right lower leg: No edema.     Left lower leg: No edema.  Lymphadenopathy:     Cervical: No cervical adenopathy.  Skin:    General: Skin is warm and dry.     Findings: No rash.  Neurological:     General: No focal deficit present.     Mental Status: She is alert. Mental status is at baseline.  Psychiatric:        Mood and Affect: Mood normal.        Behavior: Behavior normal.       Results for orders placed or performed in visit on 12/26/23  VITAMIN D 25 Hydroxy (Vit-D Deficiency, Fractures)   Collection Time: 12/26/23  8:05 AM  Result Value Ref Range   VITD 59.19 30.00 - 100.00 ng/mL  Comprehensive metabolic panel with GFR   Collection Time: 12/26/23  8:05 AM  Result Value Ref Range   Sodium 143 135 - 145 mEq/L   Potassium 3.8 3.5 - 5.1 mEq/L   Chloride 104 96 - 112 mEq/L   CO2 26 19 - 32 mEq/L   Glucose, Bld 110 (H) 70 - 99 mg/dL   BUN 13 6 - 23 mg/dL   Creatinine, Ser 1.61 0.40 - 1.20 mg/dL   Total Bilirubin 0.5 0.2 - 1.2 mg/dL   Alkaline Phosphatase 109 39 - 117 U/L   AST 15 0 - 37 U/L   ALT 12 0 - 35 U/L   Total Protein 6.5 6.0 - 8.3 g/dL   Albumin 4.4 3.5 - 5.2 g/dL   GFR 09.60 >45.40 mL/min   Calcium 9.3 8.4 - 10.5 mg/dL   Lipid panel   Collection Time: 12/26/23  8:05 AM  Result Value Ref Range   Cholesterol 132 0 - 200 mg/dL   Triglycerides 981.1 (H) 0.0 - 149.0 mg/dL   HDL 91.47 >82.95 mg/dL   VLDL 62.1 0.0 - 30.8 mg/dL   LDL Cholesterol 56 0 - 99 mg/dL   Total CHOL/HDL Ratio 3    NonHDL 88.54   Microalbumin / creatinine urine ratio   Collection Time: 12/26/23  8:05 AM  Result Value Ref Range   Microalb, Ur <0.7 mg/dL   Creatinine,U 657.8 mg/dL   Microalb Creat Ratio Unable to calculate 0.0 - 30.0 mg/g  Hemoglobin A1c   Collection Time: 12/26/23  8:05 AM  Result Value Ref Range   Hgb A1c MFr Bld 5.5 4.6 - 6.5 %   Lab Results  Component Value Date   TSH 1.760 07/11/2023   Lab Results  Component Value Date   WBC 7.9 07/11/2023   HGB 13.5 07/11/2023   HCT 41.3 07/11/2023   MCV 92 07/11/2023   PLT 272 07/11/2023   Assessment & Plan:   Problem List Items Addressed This Visit     Health maintenance examination - Primary (Chronic)   Preventative protocols reviewed and updated unless pt declined. Discussed healthy diet and lifestyle.       GERD (gastroesophageal reflux disease)   Chronic, stable period on daily omeprazole - notes GERD worsened by mounjaro      Relevant Medications   omeprazole (PRILOSEC) 40 MG capsule   MDD (major depressive disorder), recurrent episode, moderate (HCC)   Followed by psychiatrist.  Vitamin D deficiency   Continue 5000 units daily replacement       HLD (hyperlipidemia)   Chronic, stable on atorvastatin - continue. The 10-year ASCVD risk score (Arnett DK, et al., 2019) is: 2.3%   Values used to calculate the score:     Age: 56 years     Sex: Female     Is Non-Hispanic African American: No     Diabetic: Yes     Tobacco smoker: No     Systolic Blood Pressure: 122 mmHg     Is BP treated: No     HDL Cholesterol: 43.6 mg/dL     Total Cholesterol: 132 mg/dL       Relevant Medications   atorvastatin (LIPITOR) 20 MG tablet   Chronic low  back pain   S/p multilevel lumbar laminectomy 2023.       Relevant Medications   cyclobenzaprine (FLEXERIL) 10 MG tablet   Type 2 diabetes mellitus with other specified complication (HCC)   Great control to date on mounjaro 12.5mg  weekly       Relevant Medications   atorvastatin (LIPITOR) 20 MG tablet   OSA (obstructive sleep apnea)   Continue CPAP followed by neurology.       Chronic constipation   Actually notes improvement with weight loss, and mounjaro.       Severe obesity (BMI 35.0-39.9) with comorbidity (HCC)   Congratulated on 30 lb weight loss over the past year. Appreciate bariatric clinic care        Meds ordered this encounter  Medications   atorvastatin (LIPITOR) 20 MG tablet    Sig: TAKE 1 TABLET BY MOUTH DAILY AT 6 PM.    Dispense:  90 tablet    Refill:  4   omeprazole (PRILOSEC) 40 MG capsule    Sig: Take 1 capsule (40 mg total) by mouth daily.    Dispense:  90 capsule    Refill:  4    No orders of the defined types were placed in this encounter.   Patient Instructions  You are doing well today Continue omeprazole and atorvastatin.  Return as needed or in 1 year for next physical   Follow up plan: Return in about 1 year (around 01/01/2025) for annual exam, prior fasting for blood work.  Claire Crick, MD

## 2024-01-02 NOTE — Assessment & Plan Note (Addendum)
 S/p multilevel lumbar laminectomy 2023.

## 2024-01-02 NOTE — Patient Instructions (Addendum)
 You are doing well today Continue omeprazole and atorvastatin.  Return as needed or in 1 year for next physical

## 2024-01-02 NOTE — Assessment & Plan Note (Addendum)
 Continue CPAP followed by neurology.

## 2024-01-02 NOTE — Assessment & Plan Note (Signed)
Followed by psychiatrist.  

## 2024-01-02 NOTE — Assessment & Plan Note (Signed)
 Actually notes improvement with weight loss, and mounjaro.

## 2024-01-02 NOTE — Assessment & Plan Note (Signed)
 Preventative protocols reviewed and updated unless pt declined. Discussed healthy diet and lifestyle.

## 2024-01-02 NOTE — Assessment & Plan Note (Addendum)
 Congratulated on 30 lb weight loss over the past year. Appreciate bariatric clinic care

## 2024-01-02 NOTE — Assessment & Plan Note (Addendum)
 Chronic, stable period on daily omeprazole - notes GERD worsened by mounjaro

## 2024-01-02 NOTE — Assessment & Plan Note (Signed)
 Chronic, stable on atorvastatin - continue. The 10-year ASCVD risk score (Arnett DK, et al., 2019) is: 2.3%   Values used to calculate the score:     Age: 53 years     Sex: Female     Is Non-Hispanic African American: No     Diabetic: Yes     Tobacco smoker: No     Systolic Blood Pressure: 122 mmHg     Is BP treated: No     HDL Cholesterol: 43.6 mg/dL     Total Cholesterol: 132 mg/dL

## 2024-01-02 NOTE — Assessment & Plan Note (Signed)
 Great control to date on mounjaro 12.5mg  weekly

## 2024-01-04 DIAGNOSIS — F4010 Social phobia, unspecified: Secondary | ICD-10-CM | POA: Diagnosis not present

## 2024-01-04 DIAGNOSIS — F341 Dysthymic disorder: Secondary | ICD-10-CM | POA: Diagnosis not present

## 2024-01-12 ENCOUNTER — Telehealth: Payer: Self-pay | Admitting: Family Medicine

## 2024-01-12 NOTE — Telephone Encounter (Addendum)
 Replied via mychart.

## 2024-01-12 NOTE — Telephone Encounter (Signed)
 Copied from CRM [id number]. Topic: General - Other >> Jan 12, 2024 10:38 AM Rosamond Comes wrote: Reason for CRM: patient calling in. Patient has messaged Dr Mariam Shingles through MyChart on 01/02/24 and has not gotten a reply.  Patient has questions about Omeprazole  and side effects.  Patient is concerned about  memory issues that may or may not be happing.   Patient has many other questions. Patient would like a call back, patient phone  [phone] ok to leave message

## 2024-01-18 ENCOUNTER — Other Ambulatory Visit (INDEPENDENT_AMBULATORY_CARE_PROVIDER_SITE_OTHER): Payer: Self-pay | Admitting: Family Medicine

## 2024-01-18 DIAGNOSIS — F4010 Social phobia, unspecified: Secondary | ICD-10-CM | POA: Diagnosis not present

## 2024-01-18 DIAGNOSIS — F341 Dysthymic disorder: Secondary | ICD-10-CM | POA: Diagnosis not present

## 2024-01-18 DIAGNOSIS — E1169 Type 2 diabetes mellitus with other specified complication: Secondary | ICD-10-CM

## 2024-01-23 ENCOUNTER — Ambulatory Visit (INDEPENDENT_AMBULATORY_CARE_PROVIDER_SITE_OTHER): Admitting: Family Medicine

## 2024-01-23 ENCOUNTER — Encounter (INDEPENDENT_AMBULATORY_CARE_PROVIDER_SITE_OTHER): Payer: Self-pay | Admitting: Family Medicine

## 2024-01-23 ENCOUNTER — Other Ambulatory Visit (INDEPENDENT_AMBULATORY_CARE_PROVIDER_SITE_OTHER): Payer: Self-pay | Admitting: Family Medicine

## 2024-01-23 VITALS — BP 108/70 | HR 86 | Temp 98.6°F | Ht 64.0 in | Wt 221.0 lb

## 2024-01-23 DIAGNOSIS — Z1231 Encounter for screening mammogram for malignant neoplasm of breast: Secondary | ICD-10-CM | POA: Diagnosis not present

## 2024-01-23 DIAGNOSIS — E669 Obesity, unspecified: Secondary | ICD-10-CM

## 2024-01-23 DIAGNOSIS — K5909 Other constipation: Secondary | ICD-10-CM | POA: Diagnosis not present

## 2024-01-23 DIAGNOSIS — Z6837 Body mass index (BMI) 37.0-37.9, adult: Secondary | ICD-10-CM | POA: Diagnosis not present

## 2024-01-23 DIAGNOSIS — E1169 Type 2 diabetes mellitus with other specified complication: Secondary | ICD-10-CM

## 2024-01-23 LAB — HM MAMMOGRAPHY

## 2024-01-23 MED ORDER — TIRZEPATIDE 12.5 MG/0.5ML ~~LOC~~ SOAJ: 12.5000 mg | SUBCUTANEOUS | 0 refills | Status: DC

## 2024-01-23 NOTE — Progress Notes (Signed)
 Office: [phone]  /  Fax: [phone]  WEIGHT SUMMARY AND BIOMETRICS  Anthropometric Measurements Height: 5\' 4"  (1.626 m) Weight: 221 lb (100.2 kg) BMI (Calculated): 37.92 Weight at Last Visit: 229 lb Weight Lost Since Last Visit: 8 lb Weight Gained Since Last Visit: 0 Starting Weight: 279 lb Total Weight Loss (lbs): 58 lb (26.3 kg) Waist Measurement : 49.5 inches   Body Composition  Body Fat %: 47.3 % Fat Mass (lbs): 104.8 lbs Muscle Mass (lbs): 110.8 lbs Total Body Water (lbs): 85.4 lbs Visceral Fat Rating : 14   Other Clinical Data Fasting: No Labs: No Today's Visit #: 51 Starting Date: 07/26/18    Chief Complaint: OBESITY   History of Present Illness Lisa Curry is a 53 year old female with obesity who presents for a follow-up on her obesity treatment plan and progress assessment.  She is adhering to a prescribed diet plan of 1200-1400 calories per day with a goal of consuming 80 or more grams of protein, achieving this about 80% of the time. She has incorporated walking into her routine, exercising for 10 minutes three times a week, resulting in an 8-pound weight loss over the past six weeks.  She experiences a lack of appetite, eating primarily out of obligation. Breakfast is particularly challenging due to a lack of routine and motivation, often leading her to skip it in favor of coffee and sometimes [employer] protein chips, which provide 140 calories and 19 grams of protein. Meeting both calorie and protein goals consistently is difficult.  She experiences occasional heartburn, particularly after overeating meals like nachos. She is currently taking Mounjaro  and reports minimal gastrointestinal side effects such as nausea and belching, but significant constipation.  For constipation, she has started using Miralax  and has tried drinking bi drinks without significant improvement. She has a history of using Dulcolax with mixed results, leading to diarrhea after  multiple doses. Bowel movements are infrequent, typically once a week, and are hard and painful.  She is concerned about muscle loss, noting a decrease in muscle mass from 120 pounds to 110 pounds, which she attributes to her weight loss. She acknowledges the need to meet protein goals and increase physical activity to prevent further muscle loss.  She does not work and her daily routine varies, often waking up at different times and lacking a structured schedule.      PHYSICAL EXAM:  Blood pressure 108/70, pulse 86, temperature 98.6 F (37 C), height 5\' 4"  (1.626 m), weight 221 lb (100.2 kg), last menstrual period 11/14/2023, SpO2 100%. Body mass index is 37.93 kg/m.  DIAGNOSTIC DATA REVIEWED:  BMET    Component Value Date/Time   NA 143 12/26/2023 0805   NA 143 07/11/2023 1247   K 3.8 12/26/2023 0805   CL 104 12/26/2023 0805   CO2 26 12/26/2023 0805   GLUCOSE 110 (H) 12/26/2023 0805   BUN 13 12/26/2023 0805   BUN 9 07/11/2023 1247   CREATININE 0.89 12/26/2023 0805   CREATININE 0.68 03/28/2014 1632   CALCIUM  9.3 12/26/2023 0805   GFRNONAA >60 12/30/2020 0953   GFRAA 109 06/16/2020 1206   Lab Results  Component Value Date   HGBA1C 5.5 12/26/2023   HGBA1C 8.0 (H) 11/04/2013   Lab Results  Component Value Date   INSULIN  8.1 07/11/2023   INSULIN  20.0 07/26/2018   Lab Results  Component Value Date   TSH 1.760 07/11/2023   CBC    Component Value Date/Time   WBC 7.9 07/11/2023 1247  WBC 8.1 12/30/2020 0953   RBC 4.50 07/11/2023 1247   RBC 4.22 12/30/2020 0953   HGB 13.5 07/11/2023 1247   HCT 41.3 07/11/2023 1247   PLT 272 07/11/2023 1247   MCV 92 07/11/2023 1247   MCH 30.0 07/11/2023 1247   MCH 30.3 12/30/2020 0953   MCHC 32.7 07/11/2023 1247   MCHC 31.7 12/30/2020 0953   RDW 13.1 07/11/2023 1247   Iron Studies    Component Value Date/Time   IRON 62 05/30/2016 1307   FERRITIN 55.2 05/30/2016 1307   IRONPCTSAT 16.4 (L) 05/30/2016 1307   Lipid Panel      Component Value Date/Time   CHOL 132 12/26/2023 0805   CHOL 151 07/11/2023 1247   CHOL 123 10/30/2012 0000   TRIG 162.0 (H) 12/26/2023 0805   TRIG 124 10/30/2012 0000   HDL 43.60 12/26/2023 0805   HDL 54 07/11/2023 1247   CHOLHDL 3 12/26/2023 0805   VLDL 32.4 12/26/2023 0805   LDLCALC 56 12/26/2023 0805   LDLCALC 75 07/11/2023 1247   LDLCALC 59 10/30/2012 0000   LDLDIRECT 73.0 04/18/2017 0847   Hepatic Function Panel     Component Value Date/Time   PROT 6.5 12/26/2023 0805   PROT 6.5 07/11/2023 1247   ALBUMIN 4.4 12/26/2023 0805   ALBUMIN 4.4 07/11/2023 1247   AST 15 12/26/2023 0805   ALT 12 12/26/2023 0805   ALKPHOS 109 12/26/2023 0805   BILITOT 0.5 12/26/2023 0805   BILITOT 0.3 07/11/2023 1247      Component Value Date/Time   TSH 1.760 07/11/2023 1247   Nutritional Lab Results  Component Value Date   VD25OH 59.19 12/26/2023   VD25OH 58.7 07/11/2023   VD25OH 56.7 07/06/2022     Assessment and Plan Assessment & Plan Obesity Obesity management with a focus on weight loss. She has lost 8 pounds in the last 6 weeks. Current dietary plan includes a 1200-1400 calorie diet with 80+ grams of protein. Reports difficulty with breakfast intake and occasional dietary indiscretions. Exercise includes walking 10 minutes three times a week. Muscle mass has decreased by 10 pounds, which may affect metabolism and weight maintenance. Encouraged to meet protein goals and increase muscle challenge to prevent further muscle loss. Discussed the importance of morning protein intake to prevent metabolic slowdown and the benefits of a consistent daily routine for overall health. - Continue current dietary plan with 1200-1400 calories and 80+ grams of protein. - Encourage routine for meal times, especially breakfast, to ensure adequate nutrition. - Increase physical activity to challenge muscles, potentially using weights. - Refill Mounjaro  for 90 days.  Constipation Chronic constipation,  exacerbated by Mounjaro  and other medications like Flexeril  and hydrocodone . Reports infrequent bowel movements and hard stools. Current management includes Miralax  and increased fluid intake. Previous use of Dulcolax resulted in delayed diarrhea. Discussed the need for regular Miralax  use to prevent constipation rather than treating it acutely. Explained that as weight loss progresses, bowel movements may become less frequent, but the focus should be on preventing hard and painful stools. - Take Miralax  daily to prevent constipation. - Increase water intake. - If constipation does not resolve, refer to GI specialist.    She was informed of the importance of frequent follow up visits to maximize her success with intensive lifestyle modifications for her multiple health conditions.    Jasmine Mesi, MD

## 2024-01-24 ENCOUNTER — Encounter: Payer: Self-pay | Admitting: Family Medicine

## 2024-02-01 DIAGNOSIS — F341 Dysthymic disorder: Secondary | ICD-10-CM | POA: Diagnosis not present

## 2024-02-01 DIAGNOSIS — F4010 Social phobia, unspecified: Secondary | ICD-10-CM | POA: Diagnosis not present

## 2024-02-15 DIAGNOSIS — F341 Dysthymic disorder: Secondary | ICD-10-CM | POA: Diagnosis not present

## 2024-02-15 DIAGNOSIS — F4010 Social phobia, unspecified: Secondary | ICD-10-CM | POA: Diagnosis not present

## 2024-02-19 DIAGNOSIS — F411 Generalized anxiety disorder: Secondary | ICD-10-CM | POA: Diagnosis not present

## 2024-02-19 DIAGNOSIS — F39 Unspecified mood [affective] disorder: Secondary | ICD-10-CM | POA: Diagnosis not present

## 2024-02-29 DIAGNOSIS — F4010 Social phobia, unspecified: Secondary | ICD-10-CM | POA: Diagnosis not present

## 2024-02-29 DIAGNOSIS — F341 Dysthymic disorder: Secondary | ICD-10-CM | POA: Diagnosis not present

## 2024-03-14 DIAGNOSIS — F341 Dysthymic disorder: Secondary | ICD-10-CM | POA: Diagnosis not present

## 2024-03-14 DIAGNOSIS — F4010 Social phobia, unspecified: Secondary | ICD-10-CM | POA: Diagnosis not present

## 2024-03-26 DIAGNOSIS — Z01419 Encounter for gynecological examination (general) (routine) without abnormal findings: Secondary | ICD-10-CM | POA: Diagnosis not present

## 2024-03-26 DIAGNOSIS — Z6837 Body mass index (BMI) 37.0-37.9, adult: Secondary | ICD-10-CM | POA: Diagnosis not present

## 2024-03-26 DIAGNOSIS — Z124 Encounter for screening for malignant neoplasm of cervix: Secondary | ICD-10-CM | POA: Diagnosis not present

## 2024-03-26 DIAGNOSIS — N951 Menopausal and female climacteric states: Secondary | ICD-10-CM | POA: Diagnosis not present

## 2024-03-26 DIAGNOSIS — N390 Urinary tract infection, site not specified: Secondary | ICD-10-CM | POA: Diagnosis not present

## 2024-03-26 DIAGNOSIS — Z1151 Encounter for screening for human papillomavirus (HPV): Secondary | ICD-10-CM | POA: Diagnosis not present

## 2024-03-27 ENCOUNTER — Other Ambulatory Visit: Payer: Self-pay | Admitting: Obstetrics and Gynecology

## 2024-03-27 DIAGNOSIS — Z8249 Family history of ischemic heart disease and other diseases of the circulatory system: Secondary | ICD-10-CM

## 2024-03-29 DIAGNOSIS — F341 Dysthymic disorder: Secondary | ICD-10-CM | POA: Diagnosis not present

## 2024-03-29 DIAGNOSIS — F4010 Social phobia, unspecified: Secondary | ICD-10-CM | POA: Diagnosis not present

## 2024-04-02 ENCOUNTER — Encounter (INDEPENDENT_AMBULATORY_CARE_PROVIDER_SITE_OTHER): Payer: Self-pay | Admitting: Family Medicine

## 2024-04-02 ENCOUNTER — Ambulatory Visit (INDEPENDENT_AMBULATORY_CARE_PROVIDER_SITE_OTHER): Admitting: Family Medicine

## 2024-04-02 VITALS — BP 117/71 | HR 79 | Temp 98.0°F | Ht 64.0 in | Wt 207.0 lb

## 2024-04-02 DIAGNOSIS — E119 Type 2 diabetes mellitus without complications: Secondary | ICD-10-CM | POA: Diagnosis not present

## 2024-04-02 DIAGNOSIS — K760 Fatty (change of) liver, not elsewhere classified: Secondary | ICD-10-CM | POA: Diagnosis not present

## 2024-04-02 DIAGNOSIS — L304 Erythema intertrigo: Secondary | ICD-10-CM | POA: Diagnosis not present

## 2024-04-02 DIAGNOSIS — E669 Obesity, unspecified: Secondary | ICD-10-CM

## 2024-04-02 DIAGNOSIS — E559 Vitamin D deficiency, unspecified: Secondary | ICD-10-CM | POA: Diagnosis not present

## 2024-04-02 DIAGNOSIS — Z6837 Body mass index (BMI) 37.0-37.9, adult: Secondary | ICD-10-CM

## 2024-04-02 DIAGNOSIS — M6281 Muscle weakness (generalized): Secondary | ICD-10-CM | POA: Diagnosis not present

## 2024-04-02 DIAGNOSIS — E1169 Type 2 diabetes mellitus with other specified complication: Secondary | ICD-10-CM | POA: Diagnosis not present

## 2024-04-02 DIAGNOSIS — Z6841 Body Mass Index (BMI) 40.0 and over, adult: Secondary | ICD-10-CM

## 2024-04-02 DIAGNOSIS — B351 Tinea unguium: Secondary | ICD-10-CM | POA: Diagnosis not present

## 2024-04-02 DIAGNOSIS — Z7985 Long-term (current) use of injectable non-insulin antidiabetic drugs: Secondary | ICD-10-CM

## 2024-04-02 DIAGNOSIS — R531 Weakness: Secondary | ICD-10-CM

## 2024-04-02 DIAGNOSIS — Z6835 Body mass index (BMI) 35.0-35.9, adult: Secondary | ICD-10-CM

## 2024-04-02 MED ORDER — TIRZEPATIDE 12.5 MG/0.5ML ~~LOC~~ SOAJ
12.5000 mg | SUBCUTANEOUS | 0 refills | Status: DC
Start: 2024-04-02 — End: 2024-07-08

## 2024-04-02 NOTE — Progress Notes (Signed)
 Office: [phone]  /  Fax: [phone]  WEIGHT SUMMARY AND BIOMETRICS  Anthropometric Measurements Height: 5' 4 (1.626 m) Weight: 207 lb (93.9 kg) BMI (Calculated): 35.51 Weight at Last Visit: 221 lb Weight Lost Since Last Visit: 14 lb Weight Gained Since Last Visit: 0 Starting Weight: 279 lb Total Weight Loss (lbs): 72 lb (32.7 kg)   Body Composition  Body Fat %: 45.5 % Fat Mass (lbs): 94.6 lbs Muscle Mass (lbs): 107.4 lbs Total Body Water (lbs): 82.6 lbs Visceral Fat Rating : 12   Other Clinical Data Fasting: yes Labs: no Today's Visit #: 15 Starting Date: 07/26/18    Chief Complaint: OBESITY   Discussed the use of AI scribe software for clinical note transcription with the patient, who gave verbal consent to proceed.  History of Present Illness Lisa Curry is a 53 year old female with obesity who presents for obesity treatment.  She has been adhering to a dietary plan of 1200 to 1400 calories and 80 or more grams of protein about 70% of the time, resulting in a 14-pound weight loss over the past two months. She lacks appetite and generally consumes less than 1300 calories, with protein intake fluctuating between 48 and 88 grams daily. She engages in strengthening activities once a week for 45 minutes.  She feels tired, run down, and more emotional, with a general sense of achiness. She is using hydrocodone  more frequently due to these symptoms but cannot pinpoint specific areas of pain, describing it as a general 'blah' feeling. She is concerned about muscle loss.  She is attempting to increase protein intake through protein drinks and foods like mozzarella cheese sticks, and experimenting with high-protein recipes such as a ranch dip made with [place] yogurt, cottage cheese, and mozzarella. She focuses on spreading protein intake throughout the day and maintaining serving sizes.  She is worried about changes in body composition, noting certain areas are more  protruding and questioning if this is due to muscle or fat changes. She is concerned about potential muscle breakdown.  She has a history of type 2 diabetes and monitors her blood sugar levels, with a recent morning reading of 91. She is concerned about the impact of her current medication, Mounjaro , on her symptoms and is hesitant to adjust the dose due to fear of weight regain. She has not discussed her symptoms with her primary care doctor but considers the possibility of depression contributing to her current state.      PHYSICAL EXAM:  Blood pressure 117/71, pulse 79, temperature 98 F (36.7 C), height 5' 4 (1.626 m), weight 207 lb (93.9 kg), SpO2 97%. Body mass index is 35.53 kg/m.  DIAGNOSTIC DATA REVIEWED:  BMET    Component Value Date/Time   NA 143 12/26/2023 0805   NA 143 07/11/2023 1247   K 3.8 12/26/2023 0805   CL 104 12/26/2023 0805   CO2 26 12/26/2023 0805   GLUCOSE 110 (H) 12/26/2023 0805   BUN 13 12/26/2023 0805   BUN 9 07/11/2023 1247   CREATININE 0.89 12/26/2023 0805   CREATININE 0.68 03/28/2014 1632   CALCIUM  9.3 12/26/2023 0805   GFRNONAA >60 12/30/2020 0953   GFRAA 109 06/16/2020 1206   Lab Results  Component Value Date   HGBA1C 5.5 12/26/2023   HGBA1C 8.0 (H) 11/04/2013   Lab Results  Component Value Date   INSULIN  8.1 07/11/2023   INSULIN  20.0 07/26/2018   Lab Results  Component Value Date   TSH 1.760 07/11/2023  CBC    Component Value Date/Time   WBC 7.9 07/11/2023 1247   WBC 8.1 12/30/2020 0953   RBC 4.50 07/11/2023 1247   RBC 4.22 12/30/2020 0953   HGB 13.5 07/11/2023 1247   HCT 41.3 07/11/2023 1247   PLT 272 07/11/2023 1247   MCV 92 07/11/2023 1247   MCH 30.0 07/11/2023 1247   MCH 30.3 12/30/2020 0953   MCHC 32.7 07/11/2023 1247   MCHC 31.7 12/30/2020 0953   RDW 13.1 07/11/2023 1247   Iron Studies    Component Value Date/Time   IRON 62 05/30/2016 1307   FERRITIN 55.2 05/30/2016 1307   IRONPCTSAT 16.4 (L) 05/30/2016 1307    Lipid Panel     Component Value Date/Time   CHOL 132 12/26/2023 0805   CHOL 151 07/11/2023 1247   CHOL 123 10/30/2012 0000   TRIG 162.0 (H) 12/26/2023 0805   TRIG 124 10/30/2012 0000   HDL 43.60 12/26/2023 0805   HDL 54 07/11/2023 1247   CHOLHDL 3 12/26/2023 0805   VLDL 32.4 12/26/2023 0805   LDLCALC 56 12/26/2023 0805   LDLCALC 75 07/11/2023 1247   LDLCALC 59 10/30/2012 0000   LDLDIRECT 73.0 04/18/2017 0847   Hepatic Function Panel     Component Value Date/Time   PROT 6.5 12/26/2023 0805   PROT 6.5 07/11/2023 1247   ALBUMIN 4.4 12/26/2023 0805   ALBUMIN 4.4 07/11/2023 1247   AST 15 12/26/2023 0805   ALT 12 12/26/2023 0805   ALKPHOS 109 12/26/2023 0805   BILITOT 0.5 12/26/2023 0805   BILITOT 0.3 07/11/2023 1247      Component Value Date/Time   TSH 1.760 07/11/2023 1247   Nutritional Lab Results  Component Value Date   VD25OH 59.19 12/26/2023   VD25OH 58.7 07/11/2023   VD25OH 56.7 07/06/2022     Assessment and Plan Assessment & Plan Obesity She has been following a calorie-restricted diet of 1200-1400 calories with 80 or more grams of protein about 70% of the time, resulting in a 14-pound weight loss over two months. There is concern about muscle loss contributing to her weight loss, potentially impacting metabolism. She reports fatigue and achiness, possibly due to muscle breakdown from insufficient protein intake. Maintaining muscle mass is crucial to prevent metabolic slowdown and potential weight regain. The importance of protein intake was emphasized, and the potential use of protein drinks was discussed as a strategy to meet protein goals. Risks of excessive caloric restriction include muscle loss and metabolic slowdown, leading to weight regain. - Encourage adherence to a diet of 1200-1400 calories with at least 80 grams of protein daily. - Advise against excessive caloric restriction to prevent muscle loss. - Focus on nutrition to improve muscle mass before  increasing exercise intensity. - Consider using protein drinks to meet protein goals if unable to consume enough through food. - Suggest spreading protein intake throughout the day. - Provide a high-protein ranch dip recipe to increase protein intake. - Order CPK test to assess muscle breakdown.  Muscle Weakness She reports fatigue, feeling run down, and achiness, with increased hydrocodone  use. Muscle weakness may be related to muscle breakdown due to insufficient protein intake during weight loss. Other potential causes include medication side effects and depression. A CPK test will be conducted to investigate further, and a rheumatology referral may be considered if symptoms persist. The impact of hydrocodone  on her symptoms was discussed, as it may contribute to fatigue and achiness. She is not on a statin - Order CPK test to  assess muscle breakdown. - Consider rheumatology referral if symptoms persist.  Type 2 Diabetes Mellitus She is on Mounjaro  for diabetes management. There is concern about the impact of weight loss and dietary changes on her diabetes control. She is hesitant to decrease the dose of Mounjaro  due to fear of weight regain. Monitoring her diabetes control with an A1c test will help assess the need for medication adjustment. The potential benefit of reducing the Mounjaro  dose was discussed, but the decision was made to maintain the current dose until further data is available. - Order hemoglobin A1c test to assess diabetes control. - Maintain current dose of Mounjaro  for now.  Vit D deficiency - Refill vit D and check labs today     I have personally spent 40 minutes total time today in preparation, patient care, and documentation for this visit, including the following: review of clinical lab tests; review of medical history, review of current symptoms and nutritional counseling   She was informed of the importance of frequent follow up visits to maximize her success with  intensive lifestyle modifications for her multiple health conditions.    Louann Penton, MD

## 2024-04-03 ENCOUNTER — Encounter: Payer: Self-pay | Admitting: Family Medicine

## 2024-04-03 ENCOUNTER — Ambulatory Visit (INDEPENDENT_AMBULATORY_CARE_PROVIDER_SITE_OTHER): Payer: Self-pay | Admitting: Family Medicine

## 2024-04-03 LAB — CMP14+EGFR
ALT: 19 IU/L (ref 0–32)
AST: 20 IU/L (ref 0–40)
Albumin: 4.6 g/dL (ref 3.8–4.9)
Alkaline Phosphatase: 119 IU/L (ref 44–121)
BUN/Creatinine Ratio: 16 (ref 9–23)
BUN: 13 mg/dL (ref 6–24)
Bilirubin Total: 0.4 mg/dL (ref 0.0–1.2)
CO2: 22 mmol/L (ref 20–29)
Calcium: 9.7 mg/dL (ref 8.7–10.2)
Chloride: 101 mmol/L (ref 96–106)
Creatinine, Ser: 0.83 mg/dL (ref 0.57–1.00)
Globulin, Total: 2 g/dL (ref 1.5–4.5)
Glucose: 89 mg/dL (ref 70–99)
Potassium: 4.6 mmol/L (ref 3.5–5.2)
Sodium: 140 mmol/L (ref 134–144)
Total Protein: 6.6 g/dL (ref 6.0–8.5)
eGFR: 84 mL/min/1.73 (ref >59)

## 2024-04-03 LAB — VITAMIN D 25 HYDROXY (VIT D DEFICIENCY, FRACTURES): Vit D, 25-Hydroxy: 74.7 ng/mL (ref 30.0–100.0)

## 2024-04-03 LAB — CK: Total CK: 203 U/L — ABNORMAL HIGH (ref 32–182)

## 2024-04-03 LAB — HEMOGLOBIN A1C
Est. average glucose Bld gHb Est-mCnc: 103 mg/dL
Hgb A1c MFr Bld: 5.2 % (ref 4.8–5.6)

## 2024-04-17 DIAGNOSIS — R300 Dysuria: Secondary | ICD-10-CM | POA: Diagnosis not present

## 2024-04-17 DIAGNOSIS — N390 Urinary tract infection, site not specified: Secondary | ICD-10-CM | POA: Diagnosis not present

## 2024-04-18 DIAGNOSIS — L304 Erythema intertrigo: Secondary | ICD-10-CM | POA: Diagnosis not present

## 2024-04-18 DIAGNOSIS — F341 Dysthymic disorder: Secondary | ICD-10-CM | POA: Diagnosis not present

## 2024-04-18 DIAGNOSIS — F4010 Social phobia, unspecified: Secondary | ICD-10-CM | POA: Diagnosis not present

## 2024-05-01 ENCOUNTER — Other Ambulatory Visit

## 2024-05-01 DIAGNOSIS — N390 Urinary tract infection, site not specified: Secondary | ICD-10-CM | POA: Diagnosis not present

## 2024-05-01 DIAGNOSIS — N951 Menopausal and female climacteric states: Secondary | ICD-10-CM | POA: Diagnosis not present

## 2024-05-01 DIAGNOSIS — Z87440 Personal history of urinary (tract) infections: Secondary | ICD-10-CM | POA: Diagnosis not present

## 2024-05-10 DIAGNOSIS — F4010 Social phobia, unspecified: Secondary | ICD-10-CM | POA: Diagnosis not present

## 2024-05-10 DIAGNOSIS — F341 Dysthymic disorder: Secondary | ICD-10-CM | POA: Diagnosis not present

## 2024-05-15 DIAGNOSIS — F39 Unspecified mood [affective] disorder: Secondary | ICD-10-CM | POA: Diagnosis not present

## 2024-05-15 DIAGNOSIS — F411 Generalized anxiety disorder: Secondary | ICD-10-CM | POA: Diagnosis not present

## 2024-05-15 DIAGNOSIS — F5105 Insomnia due to other mental disorder: Secondary | ICD-10-CM | POA: Diagnosis not present

## 2024-05-21 ENCOUNTER — Ambulatory Visit (INDEPENDENT_AMBULATORY_CARE_PROVIDER_SITE_OTHER): Admitting: Family Medicine

## 2024-05-21 ENCOUNTER — Encounter (INDEPENDENT_AMBULATORY_CARE_PROVIDER_SITE_OTHER): Payer: Self-pay | Admitting: Family Medicine

## 2024-05-21 VITALS — BP 104/70 | HR 80 | Temp 98.0°F | Ht 64.0 in | Wt 202.0 lb

## 2024-05-21 DIAGNOSIS — R748 Abnormal levels of other serum enzymes: Secondary | ICD-10-CM

## 2024-05-21 DIAGNOSIS — Z6834 Body mass index (BMI) 34.0-34.9, adult: Secondary | ICD-10-CM

## 2024-05-21 DIAGNOSIS — K219 Gastro-esophageal reflux disease without esophagitis: Secondary | ICD-10-CM

## 2024-05-21 DIAGNOSIS — E119 Type 2 diabetes mellitus without complications: Secondary | ICD-10-CM

## 2024-05-21 DIAGNOSIS — Z7985 Long-term (current) use of injectable non-insulin antidiabetic drugs: Secondary | ICD-10-CM

## 2024-05-21 DIAGNOSIS — E669 Obesity, unspecified: Secondary | ICD-10-CM

## 2024-05-21 DIAGNOSIS — E1169 Type 2 diabetes mellitus with other specified complication: Secondary | ICD-10-CM

## 2024-05-21 NOTE — Progress Notes (Signed)
 Office: [phone]  /  Fax: [phone]  WEIGHT SUMMARY AND BIOMETRICS  Anthropometric Measurements Height: 5' 4 (1.626 m) Weight: 202 lb (91.6 kg) BMI (Calculated): 34.66 Weight at Last Visit: 207lb Weight Lost Since Last Visit: 5lb Weight Gained Since Last Visit: 0 Starting Weight: 279lb Total Weight Loss (lbs): 77 lb (34.9 kg)   Body Composition  Body Fat %: 45.7 % Fat Mass (lbs): 92.6 lbs Muscle Mass (lbs): 104.6 lbs Total Body Water (lbs): 81.8 lbs Visceral Fat Rating : 12   Other Clinical Data Fasting: no Labs: no Today's Visit #: 25 Starting Date: 07/26/18    Chief Complaint: OBESITY   History of Present Illness Lisa Curry is a 53 year old female with obesity and type 2 diabetes who presents for obesity treatment plan assessment and progress evaluation.  She has been focusing on portion control and making healthier food choices but has not been consistent with journaling her food intake. Despite not engaging in regular exercise, she has lost five pounds over the last six weeks. Recent personal changes, such as her daughter's move and a vacation, have posed challenges in maintaining her routine.  She is currently on Mounjaro  12.5 mg for type 2 diabetes management and reports no significant issues with the medication. Taking it later in the day reduces side effects like burping. Her appetite is affected, leading to skipped meals, but she is attempting to include more vegetables and protein in her diet. She uses Isopure clear protein drinks to supplement her protein intake, although she experiences issues with mixing the powder.  She recently experienced a urinary tract infection and completed a couple of rounds of antibiotics. During the infection, she did not hydrate well.  She has been off atorvastatin  since early July due to concerns about creatine kinase levels, which are to be rechecked. She did not report experiencing muscle pain after discontinuing the  statin.  She experienced heartburn during her vacation, which resolved upon returning home. She takes omeprazole  daily and has used Pepto Bismol and Pepcid AC for breakthrough symptoms. Her heartburn may be related to eating patterns while on vacation.  She is concerned about her decreasing muscle mass and acknowledges not going to the gym or doing weightlifting exercises.       PHYSICAL EXAM:  Blood pressure 104/70, pulse 80, temperature 98 F (36.7 C), height 5' 4 (1.626 m), weight 202 lb (91.6 kg), last menstrual period 09/27/2023, SpO2 99%. Body mass index is 34.67 kg/m.  DIAGNOSTIC DATA REVIEWED:  BMET    Component Value Date/Time   NA 140 04/02/2024 1008   K 4.6 04/02/2024 1008   CL 101 04/02/2024 1008   CO2 22 04/02/2024 1008   GLUCOSE 89 04/02/2024 1008   GLUCOSE 110 (H) 12/26/2023 0805   BUN 13 04/02/2024 1008   CREATININE 0.83 04/02/2024 1008   CREATININE 0.68 03/28/2014 1632   CALCIUM  9.7 04/02/2024 1008   GFRNONAA >60 12/30/2020 0953   GFRAA 109 06/16/2020 1206   Lab Results  Component Value Date   HGBA1C 5.2 04/02/2024   HGBA1C 8.0 (H) 11/04/2013   Lab Results  Component Value Date   INSULIN  8.1 07/11/2023   INSULIN  20.0 07/26/2018   Lab Results  Component Value Date   TSH 1.760 07/11/2023   CBC    Component Value Date/Time   WBC 7.9 07/11/2023 1247   WBC 8.1 12/30/2020 0953   RBC 4.50 07/11/2023 1247   RBC 4.22 12/30/2020 0953   HGB 13.5 07/11/2023 1247  HCT 41.3 07/11/2023 1247   PLT 272 07/11/2023 1247   MCV 92 07/11/2023 1247   MCH 30.0 07/11/2023 1247   MCH 30.3 12/30/2020 0953   MCHC 32.7 07/11/2023 1247   MCHC 31.7 12/30/2020 0953   RDW 13.1 07/11/2023 1247   Iron Studies    Component Value Date/Time   IRON 62 05/30/2016 1307   FERRITIN 55.2 05/30/2016 1307   IRONPCTSAT 16.4 (L) 05/30/2016 1307   Lipid Panel     Component Value Date/Time   CHOL 132 12/26/2023 0805   CHOL 151 07/11/2023 1247   CHOL 123 10/30/2012 0000    TRIG 162.0 (H) 12/26/2023 0805   TRIG 124 10/30/2012 0000   HDL 43.60 12/26/2023 0805   HDL 54 07/11/2023 1247   CHOLHDL 3 12/26/2023 0805   VLDL 32.4 12/26/2023 0805   LDLCALC 56 12/26/2023 0805   LDLCALC 75 07/11/2023 1247   LDLCALC 59 10/30/2012 0000   LDLDIRECT 73.0 04/18/2017 0847   Hepatic Function Panel     Component Value Date/Time   PROT 6.6 04/02/2024 1008   ALBUMIN 4.6 04/02/2024 1008   AST 20 04/02/2024 1008   ALT 19 04/02/2024 1008   ALKPHOS 119 04/02/2024 1008   BILITOT 0.4 04/02/2024 1008      Component Value Date/Time   TSH 1.760 07/11/2023 1247   Nutritional Lab Results  Component Value Date   VD25OH 74.7 04/02/2024   VD25OH 59.19 12/26/2023   VD25OH 58.7 07/11/2023     Assessment and Plan Assessment & Plan Obesity Obesity management is ongoing with a five-pound weight loss over the last six weeks. She is focusing on portion control and smart food choices but is not currently exercising. Muscle mass is decreasing, likely due to lack of exercise and weight loss. Protein intake is being addressed with Isopure clear protein drinks. - Encourage portion control and smart food choices - Encourage exercise, particularly walking as the weather improves - Continue Isopure clear protein drinks to support protein intake - Monitor muscle mass and encourage resistance exercises  Type 2 diabetes mellitus -controlled Type 2 diabetes is managed with Mounjaro  12.5 mg. She reports no significant side effects, although experiences delayed queasiness managed by taking the injection later in the day. She is attempting to maintain a balanced diet with adequate protein and vegetables, although she sometimes skips meals. - Continue Mounjaro  12.5 mg weekly - Encourage balanced meals with adequate protein and vegetables - Monitor for side effects and adjust timing of Mounjaro  as needed  Abnormal creatine kinase level Abnormal creatine kinase level is being monitored. She has  been off atorvastatin  since the last visit due to concerns about muscle pain, although she reports no muscle pain currently. - Order creatine kinase test today - Send creatine kinase results to PCP  Gastroesophageal reflux disease (GERD) GERD symptoms are present, particularly during vacation, likely due to dietary changes. She is on daily omeprazole  but experienced breakthrough heartburn. Symptoms improved upon returning home, suggesting dietary triggers. - Continue daily omeprazole  - Use Tums for acute heartburn symptoms - Monitor dietary triggers and adjust as needed      She was informed of the importance of frequent follow up visits to maximize her success with intensive lifestyle modifications for her multiple health conditions.    Louann Penton, MD

## 2024-05-22 ENCOUNTER — Ambulatory Visit (INDEPENDENT_AMBULATORY_CARE_PROVIDER_SITE_OTHER): Payer: Self-pay | Admitting: Family Medicine

## 2024-05-22 LAB — CK: Total CK: 145 U/L (ref 32–182)

## 2024-05-24 DIAGNOSIS — F341 Dysthymic disorder: Secondary | ICD-10-CM | POA: Diagnosis not present

## 2024-05-24 DIAGNOSIS — F4010 Social phobia, unspecified: Secondary | ICD-10-CM | POA: Diagnosis not present

## 2024-05-28 ENCOUNTER — Other Ambulatory Visit

## 2024-06-06 DIAGNOSIS — F341 Dysthymic disorder: Secondary | ICD-10-CM | POA: Diagnosis not present

## 2024-06-06 DIAGNOSIS — F4010 Social phobia, unspecified: Secondary | ICD-10-CM | POA: Diagnosis not present

## 2024-06-11 ENCOUNTER — Encounter (INDEPENDENT_AMBULATORY_CARE_PROVIDER_SITE_OTHER): Payer: Self-pay

## 2024-06-12 DIAGNOSIS — F411 Generalized anxiety disorder: Secondary | ICD-10-CM | POA: Diagnosis not present

## 2024-06-12 DIAGNOSIS — F39 Unspecified mood [affective] disorder: Secondary | ICD-10-CM | POA: Diagnosis not present

## 2024-06-12 DIAGNOSIS — F5105 Insomnia due to other mental disorder: Secondary | ICD-10-CM | POA: Diagnosis not present

## 2024-06-20 DIAGNOSIS — F4010 Social phobia, unspecified: Secondary | ICD-10-CM | POA: Diagnosis not present

## 2024-06-20 DIAGNOSIS — F341 Dysthymic disorder: Secondary | ICD-10-CM | POA: Diagnosis not present

## 2024-07-04 DIAGNOSIS — F341 Dysthymic disorder: Secondary | ICD-10-CM | POA: Diagnosis not present

## 2024-07-04 DIAGNOSIS — F4010 Social phobia, unspecified: Secondary | ICD-10-CM | POA: Diagnosis not present

## 2024-07-08 ENCOUNTER — Encounter (INDEPENDENT_AMBULATORY_CARE_PROVIDER_SITE_OTHER): Payer: Self-pay | Admitting: Family Medicine

## 2024-07-08 ENCOUNTER — Ambulatory Visit (INDEPENDENT_AMBULATORY_CARE_PROVIDER_SITE_OTHER): Admitting: Family Medicine

## 2024-07-08 VITALS — BP 113/68 | HR 86 | Temp 97.9°F | Ht 64.0 in | Wt 198.0 lb

## 2024-07-08 DIAGNOSIS — E119 Type 2 diabetes mellitus without complications: Secondary | ICD-10-CM

## 2024-07-08 DIAGNOSIS — E669 Obesity, unspecified: Secondary | ICD-10-CM

## 2024-07-08 DIAGNOSIS — F419 Anxiety disorder, unspecified: Secondary | ICD-10-CM

## 2024-07-08 DIAGNOSIS — Z6833 Body mass index (BMI) 33.0-33.9, adult: Secondary | ICD-10-CM

## 2024-07-08 DIAGNOSIS — F32A Depression, unspecified: Secondary | ICD-10-CM

## 2024-07-08 DIAGNOSIS — F4322 Adjustment disorder with anxiety: Secondary | ICD-10-CM | POA: Diagnosis not present

## 2024-07-08 DIAGNOSIS — Z7985 Long-term (current) use of injectable non-insulin antidiabetic drugs: Secondary | ICD-10-CM

## 2024-07-08 DIAGNOSIS — F418 Other specified anxiety disorders: Secondary | ICD-10-CM

## 2024-07-08 DIAGNOSIS — E66813 Obesity, class 3: Secondary | ICD-10-CM

## 2024-07-08 DIAGNOSIS — M6284 Sarcopenia: Secondary | ICD-10-CM | POA: Diagnosis not present

## 2024-07-08 DIAGNOSIS — E1169 Type 2 diabetes mellitus with other specified complication: Secondary | ICD-10-CM

## 2024-07-08 MED ORDER — TIRZEPATIDE 10 MG/0.5ML ~~LOC~~ SOAJ: 10.0000 mg | SUBCUTANEOUS | 0 refills | Status: DC

## 2024-07-08 NOTE — Progress Notes (Signed)
 Office: [phone]  /  Fax: [phone]  WEIGHT SUMMARY AND BIOMETRICS  Anthropometric Measurements Height: 5' 4 (1.626 m) Weight: 198 lb (89.8 kg) BMI (Calculated): 33.97 Weight at Last Visit: 202 lb Weight Lost Since Last Visit: 4 lb Weight Gained Since Last Visit: 0 Starting Weight: 279 lb Total Weight Loss (lbs): 81 lb (36.7 kg) Peak Weight: 279 lb   Body Composition  Body Fat %: 45.3 % Fat Mass (lbs): 89.8 lbs Muscle Mass (lbs): 103 lbs Total Body Water (lbs): 80.4 lbs Visceral Fat Rating : 12   Other Clinical Data Fasting: no Labs: no Today's Visit #: 54 Starting Date: 07/26/18    Chief Complaint: OBESITY   History of Present Illness Lisa Curry is a 53 year old female with obesity and type 2 diabetes who presents for obesity treatment plan assessment and progress evaluation.  She is following a prescribed journaling plan with a daily intake of 1300 calories and 95 or more grams of protein, but adherence is only about 10% as she is not tracking her intake. Her diet lacks adequate fruits, vegetables, and protein, and she is not hydrating sufficiently. She often skips meals and averages less than seven hours of sleep most nights.  She is on Mounjaro  12.5 mg for type 2 diabetes, with a recent hemoglobin A1c of 5.2 in July, indicating good blood sugar control. However, she has experienced significant muscle mass loss, with a decrease from 122 pounds to 103 pounds over the past year.  She experiences gastrointestinal issues, particularly after consuming hot chocolate from a coffee place, which she suspects might be related to her Mounjaro  medication or other factors.  Her mood has improved since her Zoloft dosage was increased to 150 mg, and she no longer feels 'under a cloud'.  She has a Photographer and her son-in-law assists with a weight routine, but she has not resumed regular gym attendance. She prefers staying home, which is considered her 'safe  space'. She has been prescribed buspirone on an as-needed basis for social anxiety, which she experiences in situations like shopping at a newly [employer] store or attending social gatherings.      PHYSICAL EXAM:  Blood pressure 113/68, pulse 86, temperature 97.9 F (36.6 C), height 5' 4 (1.626 m), weight 198 lb (89.8 kg), SpO2 97%. Body mass index is 33.99 kg/m.  DIAGNOSTIC DATA REVIEWED:  BMET    Component Value Date/Time   NA 140 04/02/2024 1008   K 4.6 04/02/2024 1008   CL 101 04/02/2024 1008   CO2 22 04/02/2024 1008   GLUCOSE 89 04/02/2024 1008   GLUCOSE 110 (H) 12/26/2023 0805   BUN 13 04/02/2024 1008   CREATININE 0.83 04/02/2024 1008   CREATININE 0.68 03/28/2014 1632   CALCIUM  9.7 04/02/2024 1008   GFRNONAA >60 12/30/2020 0953   GFRAA 109 06/16/2020 1206   Lab Results  Component Value Date   HGBA1C 5.2 04/02/2024   HGBA1C 8.0 (H) 11/04/2013   Lab Results  Component Value Date   INSULIN  8.1 07/11/2023   INSULIN  20.0 07/26/2018   Lab Results  Component Value Date   TSH 1.760 07/11/2023   CBC    Component Value Date/Time   WBC 7.9 07/11/2023 1247   WBC 8.1 12/30/2020 0953   RBC 4.50 07/11/2023 1247   RBC 4.22 12/30/2020 0953   HGB 13.5 07/11/2023 1247   HCT 41.3 07/11/2023 1247   PLT 272 07/11/2023 1247   MCV 92 07/11/2023 1247   MCH 30.0 07/11/2023  1247   MCH 30.3 12/30/2020 0953   MCHC 32.7 07/11/2023 1247   MCHC 31.7 12/30/2020 0953   RDW 13.1 07/11/2023 1247   Iron Studies    Component Value Date/Time   IRON 62 05/30/2016 1307   FERRITIN 55.2 05/30/2016 1307   IRONPCTSAT 16.4 (L) 05/30/2016 1307   Lipid Panel     Component Value Date/Time   CHOL 132 12/26/2023 0805   CHOL 151 07/11/2023 1247   CHOL 123 10/30/2012 0000   TRIG 162.0 (H) 12/26/2023 0805   TRIG 124 10/30/2012 0000   HDL 43.60 12/26/2023 0805   HDL 54 07/11/2023 1247   CHOLHDL 3 12/26/2023 0805   VLDL 32.4 12/26/2023 0805   LDLCALC 56 12/26/2023 0805   LDLCALC 75  07/11/2023 1247   LDLCALC 59 10/30/2012 0000   LDLDIRECT 73.0 04/18/2017 0847   Hepatic Function Panel     Component Value Date/Time   PROT 6.6 04/02/2024 1008   ALBUMIN 4.6 04/02/2024 1008   AST 20 04/02/2024 1008   ALT 19 04/02/2024 1008   ALKPHOS 119 04/02/2024 1008   BILITOT 0.4 04/02/2024 1008      Component Value Date/Time   TSH 1.760 07/11/2023 1247   Nutritional Lab Results  Component Value Date   VD25OH 74.7 04/02/2024   VD25OH 59.19 12/26/2023   VD25OH 58.7 07/11/2023     Assessment and Plan Assessment & Plan Obesity Obesity management is suboptimal due to poor adherence to dietary recommendations, inadequate protein intake, and insufficient physical activity. Current caloric intake is not being tracked, and there is a lack of adequate fruit and vegetable consumption. Hydration and sleep are also inadequate. Muscle mass is decreasing, likely due to inadequate nutrition and sarcopenia. - Reduce Mounjaro  dosage to 10 mg to improve nutritional intake - Encourage tracking of caloric intake and protein consumption - Recommend protein supplements such as shakes or bars to meet protein goals - Advise on the importance of hydration and adequate sleep - Encourage gradual increase in physical activity, starting with simple exercises like chair yoga or wall pushups  Type 2 diabetes mellitus Type 2 diabetes is well-controlled with a recent hemoglobin A1c of 5.2. Mounjaro  is currently being used for management, but dosage adjustment is needed due to nutritional concerns. - Continue Mounjaro  at adjusted dose of 10 mg - Continue diet, exercise and weight loss as discussed today as an important part of the treatment plan   Depression Depression symptoms have improved with an increase in Zoloft to 150 mg. Mood is better, and feelings of being under a cloud have lessened . - Continue Zoloft at 150 mg  Anxiety disorder Anxiety symptoms are present, with episodes of social  anxiety and difficulty breathing in stressful situations. Buspirone is prescribed as needed, but regular use may be beneficial to stabilize symptoms. The psychiatrist has recommended Buspirone to help with anxiety related to leaving the home, which is considered a safe space. - Consider regular use of Buspirone for a short period to stabilize anxiety symptoms  Sarcopenia Significant muscle mass loss, approximately 20 pounds, likely due to inadequate protein intake and lack of resistance exercise. This is contributing to sarcopenia and potential stress on kidneys and heart. Immediate intervention is necessary to prevent further muscle loss and associated complications. - Encourage increased protein intake through diet and supplements if unable to meet goals through diet alone - Recommend resistance exercises to build muscle mass after protein intake is optimized - Monitor muscle mass and kidney function  Lisa Curry was informed of the importance of frequent follow up visits to maximize her success with intensive lifestyle modifications for her obesity and obesity related health conditions as recommended by USPSTF and CMS guidelines   Louann Penton, MD

## 2024-07-11 ENCOUNTER — Encounter (INDEPENDENT_AMBULATORY_CARE_PROVIDER_SITE_OTHER): Payer: Self-pay | Admitting: Family Medicine

## 2024-07-16 ENCOUNTER — Telehealth (INDEPENDENT_AMBULATORY_CARE_PROVIDER_SITE_OTHER): Payer: Self-pay | Admitting: Family Medicine

## 2024-07-16 NOTE — Telephone Encounter (Signed)
 Patient called wanting an answer to the question she sent to Dr. Verdon. Please call the patient when you are available.

## 2024-07-22 DIAGNOSIS — F4322 Adjustment disorder with anxiety: Secondary | ICD-10-CM | POA: Diagnosis not present

## 2024-08-02 DIAGNOSIS — M5416 Radiculopathy, lumbar region: Secondary | ICD-10-CM | POA: Diagnosis not present

## 2024-08-02 DIAGNOSIS — M4326 Fusion of spine, lumbar region: Secondary | ICD-10-CM | POA: Diagnosis not present

## 2024-08-02 DIAGNOSIS — M545 Low back pain, unspecified: Secondary | ICD-10-CM | POA: Diagnosis not present

## 2024-08-02 DIAGNOSIS — M7062 Trochanteric bursitis, left hip: Secondary | ICD-10-CM | POA: Diagnosis not present

## 2024-08-02 DIAGNOSIS — Z1330 Encounter for screening examination for mental health and behavioral disorders, unspecified: Secondary | ICD-10-CM | POA: Diagnosis not present

## 2024-08-05 DIAGNOSIS — F4322 Adjustment disorder with anxiety: Secondary | ICD-10-CM | POA: Diagnosis not present

## 2024-08-23 DIAGNOSIS — M7062 Trochanteric bursitis, left hip: Secondary | ICD-10-CM | POA: Diagnosis not present

## 2024-08-23 DIAGNOSIS — M5416 Radiculopathy, lumbar region: Secondary | ICD-10-CM | POA: Diagnosis not present

## 2024-08-23 DIAGNOSIS — M791 Myalgia, unspecified site: Secondary | ICD-10-CM | POA: Diagnosis not present

## 2024-08-23 DIAGNOSIS — M4326 Fusion of spine, lumbar region: Secondary | ICD-10-CM | POA: Diagnosis not present

## 2024-08-26 ENCOUNTER — Ambulatory Visit (INDEPENDENT_AMBULATORY_CARE_PROVIDER_SITE_OTHER): Payer: Self-pay | Admitting: Family Medicine

## 2024-08-26 DIAGNOSIS — F4322 Adjustment disorder with anxiety: Secondary | ICD-10-CM | POA: Diagnosis not present

## 2024-08-27 ENCOUNTER — Encounter (INDEPENDENT_AMBULATORY_CARE_PROVIDER_SITE_OTHER): Payer: Self-pay | Admitting: Family Medicine

## 2024-08-27 ENCOUNTER — Ambulatory Visit (INDEPENDENT_AMBULATORY_CARE_PROVIDER_SITE_OTHER): Admitting: Family Medicine

## 2024-08-27 VITALS — BP 125/71 | HR 66 | Temp 97.8°F | Ht 64.0 in | Wt 196.0 lb

## 2024-08-27 DIAGNOSIS — K5909 Other constipation: Secondary | ICD-10-CM | POA: Diagnosis not present

## 2024-08-27 DIAGNOSIS — Z6833 Body mass index (BMI) 33.0-33.9, adult: Secondary | ICD-10-CM | POA: Diagnosis not present

## 2024-08-27 DIAGNOSIS — E669 Obesity, unspecified: Secondary | ICD-10-CM | POA: Diagnosis not present

## 2024-08-27 DIAGNOSIS — E559 Vitamin D deficiency, unspecified: Secondary | ICD-10-CM

## 2024-08-27 DIAGNOSIS — R748 Abnormal levels of other serum enzymes: Secondary | ICD-10-CM

## 2024-08-27 DIAGNOSIS — E1169 Type 2 diabetes mellitus with other specified complication: Secondary | ICD-10-CM

## 2024-08-27 DIAGNOSIS — E119 Type 2 diabetes mellitus without complications: Secondary | ICD-10-CM | POA: Diagnosis not present

## 2024-08-27 MED ORDER — TIRZEPATIDE 10 MG/0.5ML ~~LOC~~ SOAJ: 10.0000 mg | SUBCUTANEOUS | 0 refills | Status: AC
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
## 2024-08-29 DIAGNOSIS — F5105 Insomnia due to other mental disorder: Secondary | ICD-10-CM | POA: Diagnosis not present

## 2024-08-29 DIAGNOSIS — F39 Unspecified mood [affective] disorder: Secondary | ICD-10-CM | POA: Diagnosis not present

## 2024-08-29 DIAGNOSIS — F411 Generalized anxiety disorder: Secondary | ICD-10-CM | POA: Diagnosis not present

## 2024-09-06 DIAGNOSIS — M7062 Trochanteric bursitis, left hip: Secondary | ICD-10-CM | POA: Diagnosis not present

## 2024-09-06 DIAGNOSIS — M5416 Radiculopathy, lumbar region: Secondary | ICD-10-CM | POA: Diagnosis not present

## 2024-09-06 DIAGNOSIS — M4326 Fusion of spine, lumbar region: Secondary | ICD-10-CM | POA: Diagnosis not present

## 2024-09-06 DIAGNOSIS — M791 Myalgia, unspecified site: Secondary | ICD-10-CM | POA: Diagnosis not present

## 2024-09-10 DIAGNOSIS — E1169 Type 2 diabetes mellitus with other specified complication: Secondary | ICD-10-CM | POA: Diagnosis not present

## 2024-09-11 LAB — CMP14+EGFR
ALT: 21 IU/L (ref 0–32)
AST: 22 IU/L (ref 0–40)
Albumin: 4.2 g/dL (ref 3.8–4.9)
Alkaline Phosphatase: 110 IU/L (ref 49–135)
BUN/Creatinine Ratio: 16 (ref 9–23)
BUN: 14 mg/dL (ref 6–24)
Bilirubin Total: 0.2 mg/dL (ref 0.0–1.2)
CO2: 26 mmol/L (ref 20–29)
Calcium: 9.8 mg/dL (ref 8.7–10.2)
Chloride: 102 mmol/L (ref 96–106)
Creatinine, Ser: 0.87 mg/dL (ref 0.57–1.00)
Globulin, Total: 2.2 g/dL (ref 1.5–4.5)
Glucose: 88 mg/dL (ref 70–99)
Potassium: 4.7 mmol/L (ref 3.5–5.2)
Sodium: 141 mmol/L (ref 134–144)
Total Protein: 6.4 g/dL (ref 6.0–8.5)
eGFR: 79 mL/min/1.73

## 2024-09-11 LAB — LIPID PANEL WITH LDL/HDL RATIO
Cholesterol, Total: 213 mg/dL — ABNORMAL HIGH (ref 100–199)
HDL: 56 mg/dL
LDL Chol Calc (NIH): 130 mg/dL — ABNORMAL HIGH (ref 0–99)
LDL/HDL Ratio: 2.3 ratio (ref 0.0–3.2)
Triglycerides: 151 mg/dL — ABNORMAL HIGH (ref 0–149)
VLDL Cholesterol Cal: 27 mg/dL (ref 5–40)

## 2024-09-11 LAB — HEMOGLOBIN A1C
Est. average glucose Bld gHb Est-mCnc: 108 mg/dL
Hgb A1c MFr Bld: 5.4 % (ref 4.8–5.6)

## 2024-09-11 LAB — INSULIN, RANDOM: INSULIN: 14 u[IU]/mL (ref 2.6–24.9)

## 2024-09-11 LAB — CK: Total CK: 113 U/L (ref 32–182)

## 2024-09-11 LAB — VITAMIN D 25 HYDROXY (VIT D DEFICIENCY, FRACTURES): Vit D, 25-Hydroxy: 67.3 ng/mL (ref 30.0–100.0)

## 2024-09-11 LAB — TSH: TSH: 3.22 u[IU]/mL (ref 0.450–4.500)

## 2024-09-16 DIAGNOSIS — F4322 Adjustment disorder with anxiety: Secondary | ICD-10-CM | POA: Diagnosis not present

## 2024-09-26 ENCOUNTER — Encounter (INDEPENDENT_AMBULATORY_CARE_PROVIDER_SITE_OTHER): Payer: Self-pay | Admitting: Family Medicine

## 2024-10-02 ENCOUNTER — Inpatient Hospital Stay
Admission: RE | Admit: 2024-10-02 | Discharge: 2024-10-02 | Attending: Obstetrics and Gynecology | Admitting: Obstetrics and Gynecology

## 2024-10-02 DIAGNOSIS — Z8249 Family history of ischemic heart disease and other diseases of the circulatory system: Secondary | ICD-10-CM

## 2024-10-15 ENCOUNTER — Telehealth (INDEPENDENT_AMBULATORY_CARE_PROVIDER_SITE_OTHER): Payer: Self-pay

## 2024-10-15 NOTE — Telephone Encounter (Signed)
 Lisa Curry (Key: [id number]) Mounjaro  12.5MG /0.5ML auto-injectors

## 2024-10-15 NOTE — Telephone Encounter (Signed)
 Message from plan: Mounjaro  T2DM.  Information regarding your request Available without authorization.  Pt notified via my chart.

## 2024-10-30 ENCOUNTER — Ambulatory Visit (INDEPENDENT_AMBULATORY_CARE_PROVIDER_SITE_OTHER): Admitting: Family Medicine

## 2025-01-03 ENCOUNTER — Encounter: Admitting: Family Medicine
